# Patient Record
Sex: Female | Born: 1935 | Race: White | Hispanic: No | Smoking: Never smoker
Health system: Southern US, Community
[De-identification: ages and names within clinical notes are randomized; demographics above are authoritative.]

## PROBLEM LIST (undated history)

## (undated) DIAGNOSIS — M5137 Other intervertebral disc degeneration, lumbosacral region: Secondary | ICD-10-CM

## (undated) DIAGNOSIS — Z7901 Long term (current) use of anticoagulants: Secondary | ICD-10-CM

## (undated) DIAGNOSIS — M47817 Spondylosis without myelopathy or radiculopathy, lumbosacral region: Secondary | ICD-10-CM

## (undated) DIAGNOSIS — M7989 Other specified soft tissue disorders: Secondary | ICD-10-CM

## (undated) DIAGNOSIS — I509 Heart failure, unspecified: Secondary | ICD-10-CM

## (undated) DIAGNOSIS — E785 Hyperlipidemia, unspecified: Secondary | ICD-10-CM

## (undated) DIAGNOSIS — M479 Spondylosis, unspecified: Secondary | ICD-10-CM

## (undated) DIAGNOSIS — N390 Urinary tract infection, site not specified: Secondary | ICD-10-CM

## (undated) DIAGNOSIS — I5032 Chronic diastolic (congestive) heart failure: Secondary | ICD-10-CM

## (undated) DIAGNOSIS — M51379 Other intervertebral disc degeneration, lumbosacral region without mention of lumbar back pain or lower extremity pain: Secondary | ICD-10-CM

## (undated) DIAGNOSIS — I4891 Unspecified atrial fibrillation: Secondary | ICD-10-CM

## (undated) DIAGNOSIS — K5790 Diverticulosis of intestine, part unspecified, without perforation or abscess without bleeding: Secondary | ICD-10-CM

## (undated) DIAGNOSIS — S22000A Wedge compression fracture of unspecified thoracic vertebra, initial encounter for closed fracture: Secondary | ICD-10-CM

## (undated) DIAGNOSIS — I83893 Varicose veins of bilateral lower extremities with other complications: Secondary | ICD-10-CM

## (undated) DIAGNOSIS — I10 Essential (primary) hypertension: Secondary | ICD-10-CM

## (undated) DIAGNOSIS — M546 Pain in thoracic spine: Secondary | ICD-10-CM

## (undated) DIAGNOSIS — E559 Vitamin D deficiency, unspecified: Secondary | ICD-10-CM

## (undated) DIAGNOSIS — K635 Polyp of colon: Secondary | ICD-10-CM

## (undated) DIAGNOSIS — M1990 Unspecified osteoarthritis, unspecified site: Secondary | ICD-10-CM

## (undated) DIAGNOSIS — I2510 Atherosclerotic heart disease of native coronary artery without angina pectoris: Secondary | ICD-10-CM

## (undated) DIAGNOSIS — E119 Type 2 diabetes mellitus without complications: Secondary | ICD-10-CM

## (undated) HISTORY — DX: Atherosclerotic heart disease of native coronary artery without angina pectoris: I25.10

## (undated) HISTORY — DX: Other intervertebral disc degeneration, lumbosacral region without mention of lumbar back pain or lower extremity pain: M51.379

## (undated) HISTORY — PX: ADENOIDECTOMY: SUR15

## (undated) HISTORY — DX: Pain in thoracic spine: M54.6

## (undated) HISTORY — DX: Spondylosis without myelopathy or radiculopathy, lumbosacral region: M47.817

## (undated) HISTORY — PX: EYE SURGERY: SHX253

## (undated) HISTORY — DX: Essential (primary) hypertension: I10

## (undated) HISTORY — DX: Spondylosis, unspecified: M47.9

## (undated) HISTORY — DX: Type 2 diabetes mellitus without complications: E11.9

## (undated) HISTORY — DX: Unspecified atrial fibrillation: I48.91

## (undated) HISTORY — DX: Unspecified osteoarthritis, unspecified site: M19.90

## (undated) HISTORY — PX: CORONARY ANGIOPLASTY: SHX604

## (undated) HISTORY — DX: Other intervertebral disc degeneration, lumbosacral region: M51.37

## (undated) HISTORY — DX: Wedge compression fracture of unspecified thoracic vertebra, initial encounter for closed fracture: S22.000A

## (undated) HISTORY — PX: TONSILLECTOMY: SUR1361

## (undated) HISTORY — DX: Urinary tract infection, site not specified: N39.0

## (undated) HISTORY — DX: Polyp of colon: K63.5

## (undated) HISTORY — DX: Hyperlipidemia, unspecified: E78.5

## (undated) HISTORY — DX: Varicose veins of bilateral lower extremities with other complications: I83.893

## (undated) HISTORY — DX: Chronic diastolic (congestive) heart failure: I50.32

## (undated) HISTORY — DX: Diverticulosis of intestine, part unspecified, without perforation or abscess without bleeding: K57.90

## (undated) HISTORY — DX: Long term (current) use of anticoagulants: Z79.01

## (undated) HISTORY — DX: Heart failure, unspecified: I50.9

## (undated) HISTORY — DX: Other specified soft tissue disorders: M79.89

## (undated) HISTORY — DX: Vitamin D deficiency, unspecified: E55.9

---

## 2008-10-01 DIAGNOSIS — I4891 Unspecified atrial fibrillation: Secondary | ICD-10-CM

## 2008-10-01 HISTORY — PX: CARDIAC CATHETERIZATION: SHX172

## 2008-10-01 HISTORY — DX: Unspecified atrial fibrillation: I48.91

## 2010-03-15 ENCOUNTER — Encounter: Admission: RE | Admit: 2010-03-15 | Discharge: 2010-03-15 | Payer: Self-pay | Admitting: Internal Medicine

## 2011-03-27 IMAGING — US US EXTREM LOW VENOUS BILAT
1 series · 13 of 24 positions shown · non-contrast
Comparison: none

CLINICAL DATA: Symptomatic bilateral lower extremity varicose veins

BILATERALLOWER EXTREMITY VENOUS DOPPLER ULTRASOUND
TECHNIQUE: Gray-scale sonography with compression as well as color
and duplex Doppler ultrasound were performed to evaluate the deep
venous system from the level of the common femoral vein through the
popliteal and proximal calf veins. Standing gray-scale sonography
and duplex Doppler evaluation of the superficial venous system with
augmentation was performed.

[Series 1: us extrem low venous bilat · 13 of 56 slices shown]
[im 1/56]
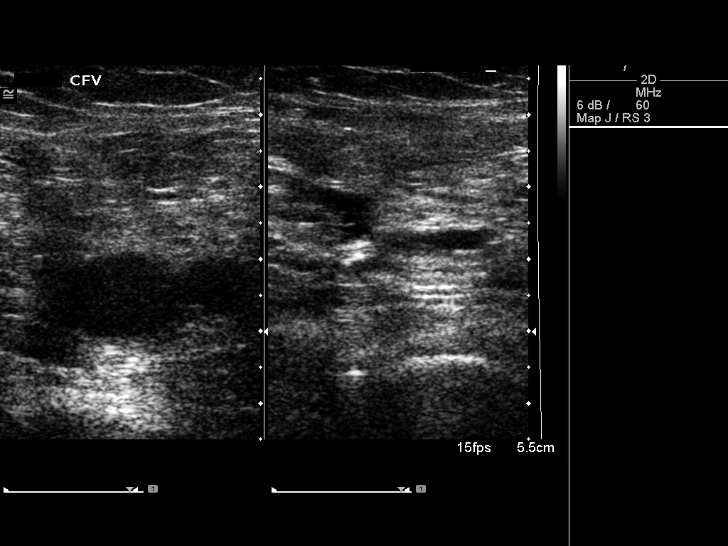
[im 5/56]
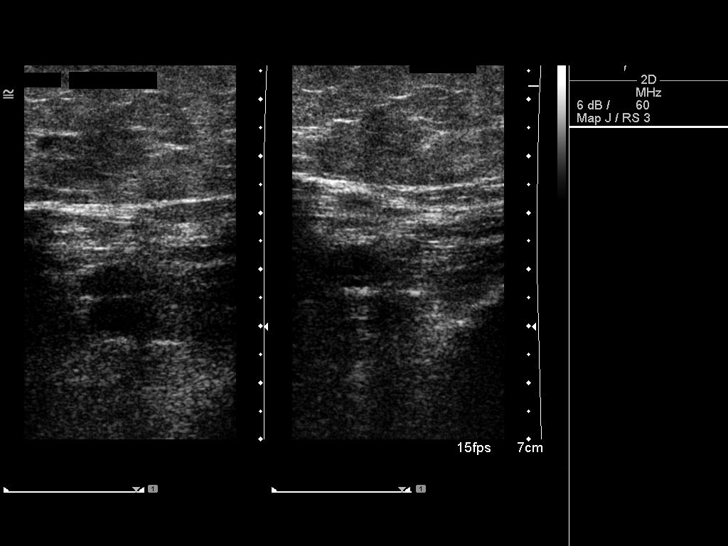
[im 10/56]
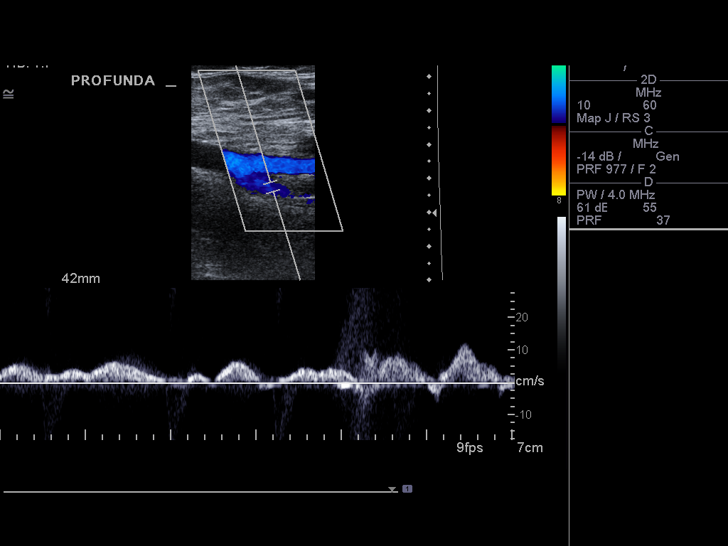
[im 15/56]
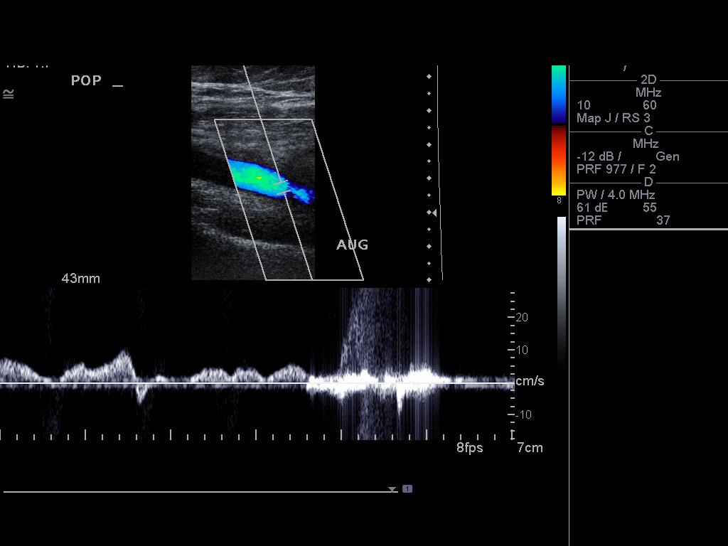
[im 20/56]
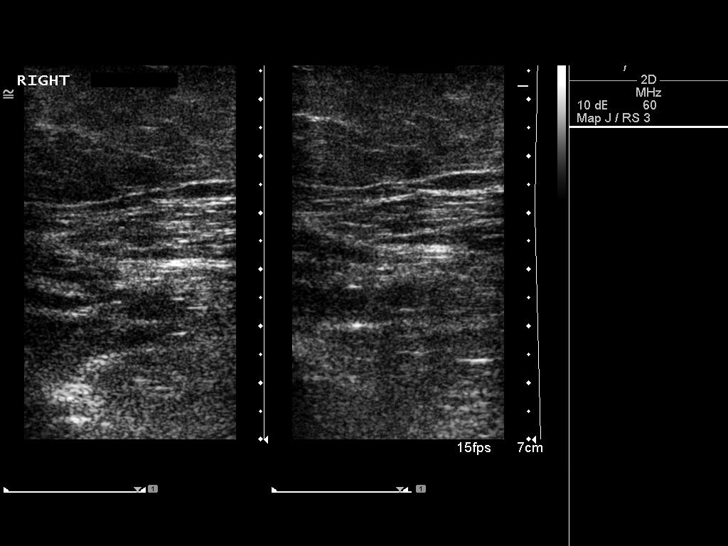
[im 24/56]
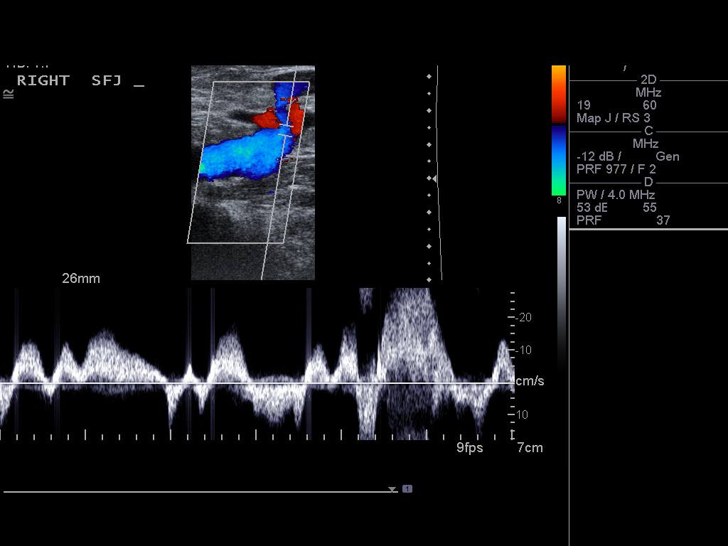
[im 29/56]
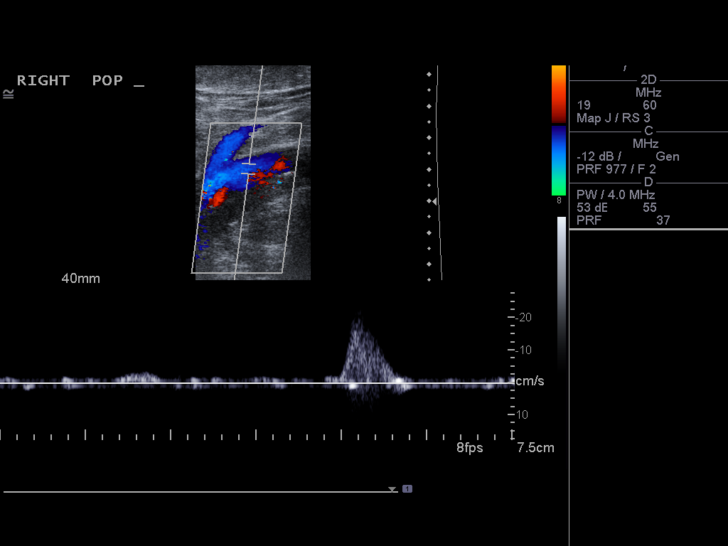
[im 32/56]
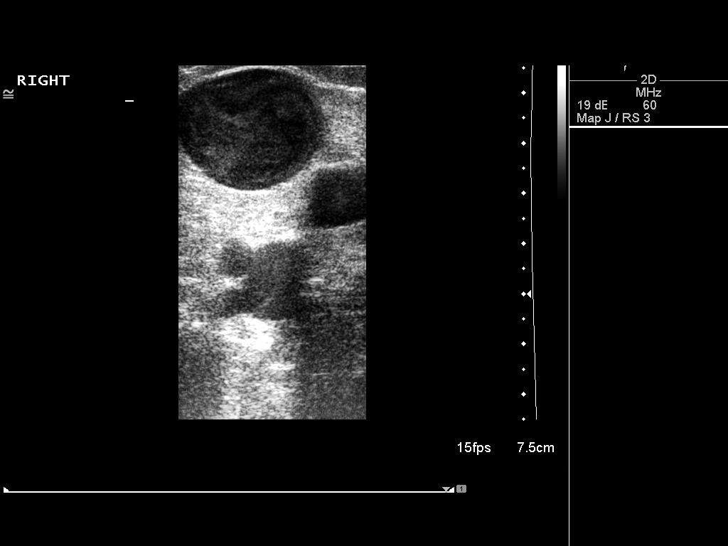
[im 36/56]
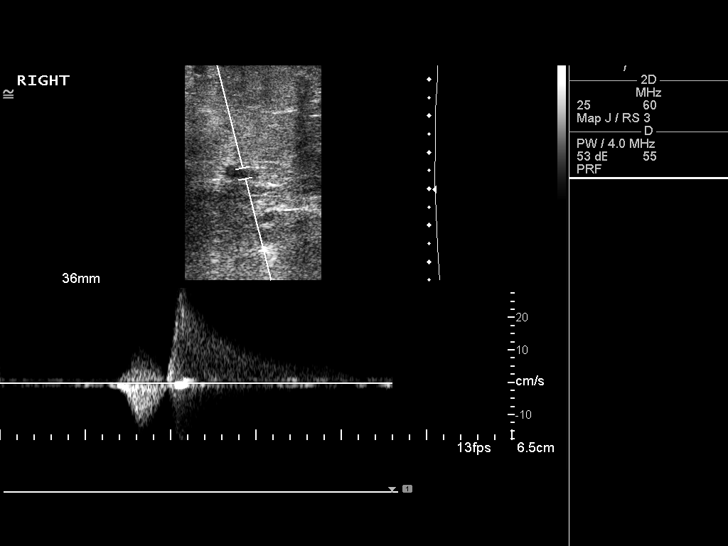
[im 41/56]
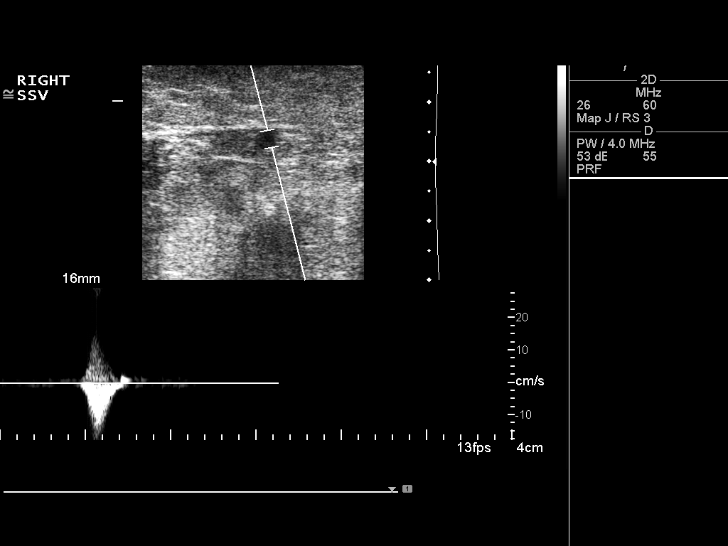
[im 46/56]
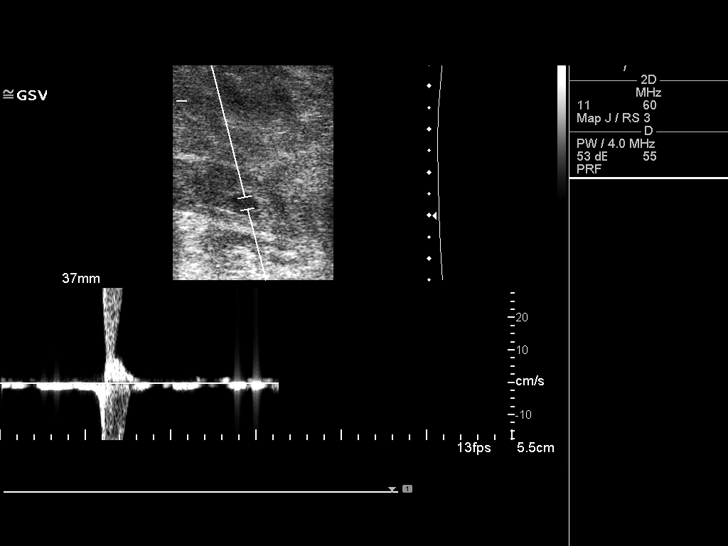
[im 51/56]
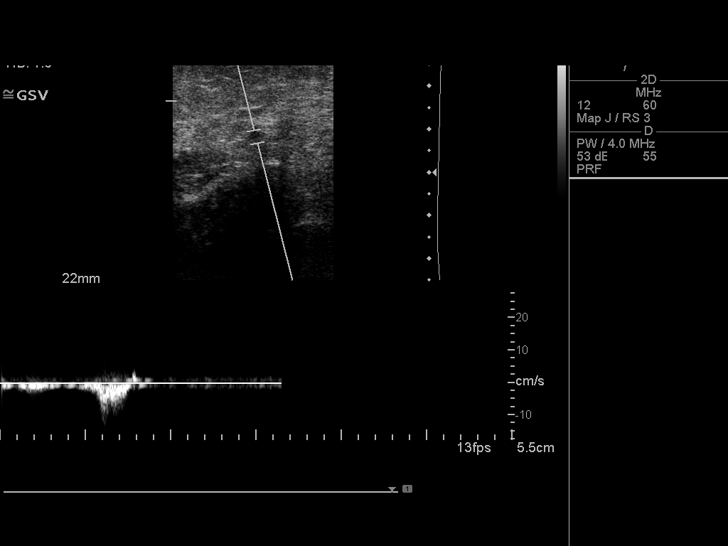
[im 56/56]
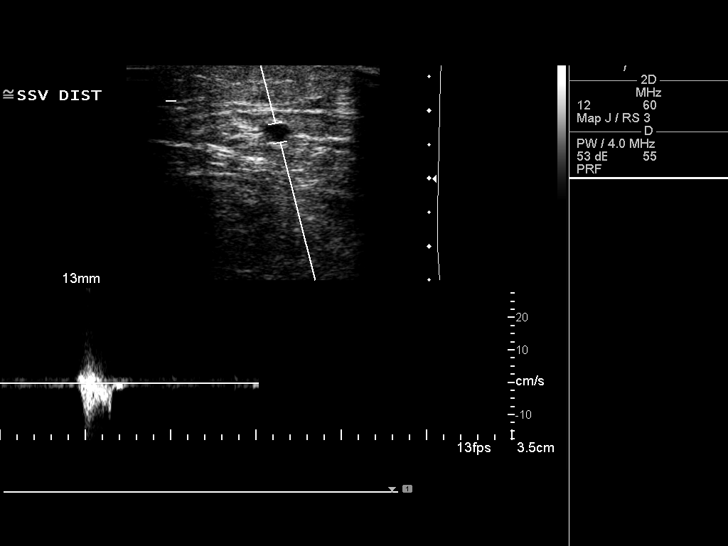

[13 of 24 positions shown; findings below may reference images not displayed]

FINDINGS: The lower extremity deep venous system demonstrates
normal compressibility, phasicity, and augmentation. No evidence of
DVT.

On the right, there is valvular incompetence and reflux in the
greater saphenous vein from the saphenofemoral junction to the
distal thigh.  There is a large dilated anterolateral GSV tributary
which is tortuous and extends across the knee into the proximal
calf.  This vessel is dilated up to 2.8 cm proximally.  The short
saphenous vein is unremarkable.

On the left, the greater short saphenous veins show no valvular
incompetence or reflux.  There are a few scattered non associated
varicose veins in the distal thigh.

IMPRESSION

1.  Extensive valvular incompetence and reflux in the right greater
saphenous vein and a dilated anterolateral tributary.
2.  No valvular incompetence or reflux in the left short and
greater saphenous veins.
3.  Normal deep venous system bilaterally.

## 2011-04-23 ENCOUNTER — Ambulatory Visit: Payer: Self-pay | Admitting: Cardiovascular Disease

## 2011-05-03 ENCOUNTER — Ambulatory Visit: Payer: Self-pay | Admitting: Cardiovascular Disease

## 2011-05-03 ENCOUNTER — Encounter: Payer: Self-pay | Admitting: Cardiovascular Disease

## 2011-05-07 ENCOUNTER — Ambulatory Visit (INDEPENDENT_AMBULATORY_CARE_PROVIDER_SITE_OTHER): Payer: Medicare Other | Admitting: Cardiovascular Disease

## 2011-05-07 ENCOUNTER — Encounter: Payer: Self-pay | Admitting: Cardiovascular Disease

## 2011-05-07 VITALS — BP 192/94 | HR 50 | Ht 64.0 in | Wt 188.0 lb

## 2011-05-07 DIAGNOSIS — I2510 Atherosclerotic heart disease of native coronary artery without angina pectoris: Secondary | ICD-10-CM

## 2011-05-07 DIAGNOSIS — I10 Essential (primary) hypertension: Secondary | ICD-10-CM

## 2011-05-07 DIAGNOSIS — I482 Chronic atrial fibrillation, unspecified: Secondary | ICD-10-CM | POA: Insufficient documentation

## 2011-05-07 DIAGNOSIS — I4891 Unspecified atrial fibrillation: Secondary | ICD-10-CM

## 2011-05-07 MED ORDER — AMLODIPINE BESYLATE 5 MG PO TABS: 5.0000 mg | ORAL_TABLET | Freq: Every day | ORAL | Status: DC

## 2011-05-07 NOTE — Progress Notes (Signed)
HPI  This is a 76 year old female who is here today to establish cardiovascular care. She is transferring care from [place] [facility]. She has history of atrial fibrillation which was diagnosed in 2010. She has been in atrial fibrillation since then. She had palpitations for years before that. She had cardiac catheterization done at that time at [facility] which showed mild nonobstructive coronary artery disease with normal ejection fraction. She has been treated with rate control which include digoxin, diltiazem and metoprolol. She is also on long-term anticoagulation with warfarin which is being managed by Dr. [name].  She also has history of refractory hypertension. She had a renal duplex ultrasound done in 2010 which showed no significant renal artery stenosis. She was placed recently on Exforge- HCTZ which controlled her hypertension. However, she had significant drop in her blood pressure and stopped taking the medication. She was still taking all her other antihypertensive medications though. She does not take her blood pressure medications on the same time everyday and sometimes she misses a few doses. Currently, she complains of slight dizziness and fatigue. She also had few episodes of chest pain recently after she had coffee. Usually she is not able to drink coffee due to what seems to be gastroesophageal reflux disease. She did have a few episodes of chest pain with some physical activities also.  Allergies  Allergen Reactions  . Iodinated Diagnostic Agents   . Penicillins      Current Outpatient Prescriptions on File Prior to Visit  Medication Sig Dispense Refill  . aspirin 81 MG EC tablet Take 81 mg by mouth daily.        . benazepril (LOTENSIN) 20 MG tablet Take 20 mg by mouth 2 (two) times daily.        . digoxin (LANOXIN) 0.25 MG tablet Take 250 mcg by mouth daily.        . fish oil-omega-3 fatty acids 1000 MG capsule Take 2 g by mouth 2 (two) times daily.         . furosemide (LASIX) 40 MG tablet Take 40 mg by mouth 2 (two) times daily.        Marland Kitchen glipiZIDE (GLUCOTROL XL) 5 MG 24 hr tablet Take 5 mg by mouth daily.        . hydrALAZINE (APRESOLINE) 25 MG tablet Take 25 mg by mouth 3 (three) times daily.        . isosorbide mononitrate (IMDUR) 60 MG 24 hr tablet Take 60 mg by mouth daily.        . metFORMIN (GLUCOPHAGE) 500 MG tablet Take 500 mg by mouth 2 (two) times daily with a meal.        . metoprolol (TOPROL-XL) 50 MG 24 hr tablet Take 50 mg by mouth daily.        . potassium chloride (KLOR-CON) 10 MEQ CR tablet Take 10 mEq by mouth daily.        . potassium chloride SA (K-DUR,KLOR-CON) 20 MEQ tablet Take 20 mEq by mouth daily.        . pravastatin (PRAVACHOL) 40 MG tablet Take 80 mg by mouth at bedtime.       Marland Kitchen warfarin (COUMADIN) 5 MG tablet Take 5 mg by mouth as directed.           Past Medical History  Diagnosis Date  . UTI (lower urinary tract infection)   . Swelling of limb   . Varicose veins of lower extremities with other complications   .  Congestive heart failure, unspecified   . Osteoarthrosis, unspecified whether generalized or localized, unspecified site   . Lumbosacral spondylosis without myelopathy   . Hemorrhoids   . DM2 (diabetes mellitus, type 2)   . Colonic polyp   . Degeneration of lumbar or lumbosacral intervertebral disc   . Diverticulosis   . HLD (hyperlipidemia)   . Pain in thoracic spine   . Atrial fibrillation 2010  . HTN (hypertension)     diagnosed when she was in her 52s. Refractory. No RAS by [place] in 2010  . Coronary artery disease     mild non obstructive disease per cardiac cath in 2010     Past Surgical History  Procedure Date  . Tonsillectomy   . Adenoidectomy   . Cardiac catheterization 2010     Family History  Problem Relation Age of Onset  . Heart disease    . Cancer Sister   . Stroke Mother      History   Social History  . Marital Status: Unknown    Spouse Name: N/A    Number of  Children: N/A  . Years of Education: N/A   Occupational History  . retired    Social History Main Topics  . Smoking status: Never Smoker   . Smokeless tobacco: Not on file  . Alcohol Use: No  . Drug Use: Not on file  . Sexually Active: Not on file   Other Topics Concern  . Not on file   Social History Narrative  . No narrative on file     ROS Constitutional: Negative for fever, chills, diaphoresis, activity change, appetite change and fatigue.  HENT: Negative for hearing loss, nosebleeds, congestion, sore throat, facial swelling, drooling, trouble swallowing, neck pain, voice change, sinus pressure and tinnitus.  Eyes: Negative for photophobia, pain, discharge and visual disturbance.  Respiratory: Negative for apnea, cough, chest tightness, shortness of breath and wheezing.  Cardiovascular: Negative for , palpitations. Gastrointestinal: Negative for nausea, vomiting, abdominal pain, diarrhea, constipation, blood in stool and abdominal distention.  Genitourinary: Negative for dysuria, urgency, frequency, hematuria and decreased urine volume.  Musculoskeletal: Negative for myalgias, back pain, joint swelling, arthralgias and gait problem.  Skin: Negative for color change, pallor, rash and wound.  Neurological: Negative for dizziness, tremors, seizures, syncope, speech difficulty, weakness, light-headedness, numbness and headaches.  Psychiatric/Behavioral: Negative for suicidal ideas, hallucinations, behavioral problems and agitation. The patient is not nervous/anxious.     PHYSICAL EXAM   BP 192/94  Pulse 50  Ht 5' 4" (1.626 m)  Wt 188 lb (85.276 kg)  BMI 32.27 kg/m2  SpO2 97%   EKG: Atrial fibrillation with slow ventricular response. Ventricular rate is 50 beats per minute. There is nonspecific ST-T wave changes.   ASSESSMENT AND PLAN

## 2011-05-07 NOTE — Assessment & Plan Note (Signed)
Patient has refractory hypertension with negative workup for renal artery stenosis in the past. She did not take her medications today and missed taking benazepril for the last 2 days. I discussed with her the importance of compliance in taking her medications on time and preferably in the morning. I will start her on amlodipine 5 mg once daily. I think some of her  Problems might be related to compliance and inability to keep up with all her medications. I will try to gradually simplified her antihypertensive regimen. it seems that the Exforge actually worked well for her but she was still taking the other antihypertensive medications. I would consider putting her back on such a combination medication and withdrawing hydralazine, Imdur were and possibly benazepril.

## 2011-05-07 NOTE — Assessment & Plan Note (Signed)
This is chronic at this point. Ejection fraction is normal. This is being treated with rate control and long-term anticoagulation with warfarin. Her ventricular rate is too slow. She actually did have a Holter monitor in the past which was reviewed. It showed significant slow ventricular rate occasionally in the 30s with pauses up to 3.5 seconds. I reviewed all her cardiac records. Based on that, I recommend stopping diltiazem and keeping her on digoxin and metoprolol. If further heart rate control is needed, then I would increase her dose of metoprolol which is currently only 50 mg once daily.

## 2011-05-07 NOTE — Assessment & Plan Note (Signed)
Patient is having some atypical chest pain at this point. Her previous cardiac catheterization showed no evidence of obstructive coronary artery disease. I will consider ischemic evaluation with a stress test upon followup if she continues to have chest pain after controlling her blood pressure.

## 2011-05-07 NOTE — Patient Instructions (Signed)
Your physician recommends that you schedule a follow-up appointment in: 2 weeks  Your physician has recommended you make the following change in your medication: STOP diltiazem and START Amlodipine 5 mg daily

## 2011-05-14 ENCOUNTER — Encounter: Payer: Self-pay | Admitting: Cardiovascular Disease

## 2011-05-21 ENCOUNTER — Ambulatory Visit (INDEPENDENT_AMBULATORY_CARE_PROVIDER_SITE_OTHER): Payer: Medicare Other | Admitting: Cardiovascular Disease

## 2011-05-21 ENCOUNTER — Encounter: Payer: Self-pay | Admitting: Cardiovascular Disease

## 2011-05-21 DIAGNOSIS — I4891 Unspecified atrial fibrillation: Secondary | ICD-10-CM

## 2011-05-21 DIAGNOSIS — I10 Essential (primary) hypertension: Secondary | ICD-10-CM

## 2011-05-21 MED ORDER — SPIRONOLACTONE 25 MG PO TABS: 25.0000 mg | ORAL_TABLET | Freq: Every day | ORAL | Status: DC

## 2011-05-21 MED ORDER — CARVEDILOL 25 MG PO TABS: 25.0000 mg | ORAL_TABLET | Freq: Two times a day (BID) | ORAL | Status: DC

## 2011-05-21 NOTE — Assessment & Plan Note (Signed)
The patient clearly has refractory hypertension. No previous renal artery stenosis by ultrasound in 2010. I will switch her from metoprolol to carvedilol 25 mg twice daily which has a stronger antihypertensive effect. I will also add Aldactone 25 mg once daily which is helpful in cases of refractory hypertension. I will check basic metabolic profile in one week.

## 2011-05-21 NOTE — Patient Instructions (Signed)
Your physician recommends that you schedule a follow-up appointment in: 2 weeks  Your physician has recommended you make the following change in your medication: STOP Toprol (metoprolol) START Carvedilol 25 mg twice daily and Spironolactone 25 mg daily  Your physician recommends that you return for lab work in: 1 week (BMP & Digoxin level)

## 2011-05-21 NOTE — Progress Notes (Signed)
HPI  This is a 76 year old female who is here today for a followup visit. She was seen recently for atrial fibrillation as well as refractory hypertension. Her ventricular rate was slow and thus, I  Stopped diltiazem and switched her to amlodipine 5 mg once daily. The patient is feeling reasonably well. She denies any palpitations. Her blood pressure is still elevated. She denies any significant dyspnea or chest pain at this time.   Allergies  Allergen Reactions  . Iodinated Diagnostic Agents   . Penicillins      Current Outpatient Prescriptions on File Prior to Visit  Medication Sig Dispense Refill  . amLODipine (NORVASC) 5 MG tablet Take 1 tablet (5 mg total) by mouth daily.  30 tablet  2  . aspirin 81 MG EC tablet Take 81 mg by mouth daily.        . benazepril (LOTENSIN) 20 MG tablet Take 20 mg by mouth 2 (two) times daily.        . digoxin (LANOXIN) 0.25 MG tablet Take 250 mcg by mouth daily.        . fish oil-omega-3 fatty acids 1000 MG capsule Take 2 g by mouth 2 (two) times daily.       . furosemide (LASIX) 40 MG tablet Take 40 mg by mouth 2 (two) times daily.        Marland Kitchen glipiZIDE (GLUCOTROL XL) 5 MG 24 hr tablet Take 5 mg by mouth daily.        . hydrALAZINE (APRESOLINE) 25 MG tablet Take 25 mg by mouth 2 (two) times daily.       . isosorbide mononitrate (IMDUR) 60 MG 24 hr tablet Take 60 mg by mouth daily.        . metFORMIN (GLUCOPHAGE) 500 MG tablet Take 500 mg by mouth 2 (two) times daily with a meal.        . potassium chloride (KLOR-CON) 10 MEQ CR tablet Take 10 mEq by mouth daily.        . potassium chloride SA (K-DUR,KLOR-CON) 20 MEQ tablet Take 20 mEq by mouth daily.       . pravastatin (PRAVACHOL) 40 MG tablet Take 40 mg by mouth at bedtime.       Marland Kitchen warfarin (COUMADIN) 5 MG tablet Take 5 mg by mouth as directed.           Past Medical History  Diagnosis Date  . UTI (lower urinary tract infection)   . Swelling of limb   . Varicose veins of lower extremities with other  complications   . Congestive heart failure, unspecified   . Osteoarthrosis, unspecified whether generalized or localized, unspecified site   . Lumbosacral spondylosis without myelopathy   . Hemorrhoids   . DM2 (diabetes mellitus, type 2)   . Colonic polyp   . Degeneration of lumbar or lumbosacral intervertebral disc   . Diverticulosis   . HLD (hyperlipidemia)   . Pain in thoracic spine   . Atrial fibrillation 2010  . Coronary artery disease     mild non obstructive disease per cardiac cath in 2010  . HTN (hypertension)     diagnosed when she was in her 41s. Refractory. No RAS by [place] in 2010     Past Surgical History  Procedure Date  . Tonsillectomy   . Adenoidectomy   . Cardiac catheterization 2010     Family History  Problem Relation Age of Onset  . Heart disease    . Cancer Sister   .  Stroke Mother      History   Social History  . Marital Status: Unknown    Spouse Name: N/A    Number of Children: N/A  . Years of Education: N/A   Occupational History  . retired    Social History Main Topics  . Smoking status: Never Smoker   . Smokeless tobacco: Not on file  . Alcohol Use: No  . Drug Use: Not on file  . Sexually Active: Not on file   Other Topics Concern  . Not on file   Social History Narrative  . No narrative on file       PHYSICAL EXAM   BP 197/89  Pulse 77  Ht _0  (1.626 m)  Wt 187 lb (84.823 kg)  BMI 32.10 kg/m2  SpO2 96%  Constitutional: She is oriented to person, place, and time. She appears well-developed and well-nourished. No distress.  HENT: No nasal discharge.  Head: Normocephalic and atraumatic.  Eyes: Pupils are equal, round, and reactive to light. Right eye exhibits no discharge. Left eye exhibits no discharge.  Neck: Normal range of motion. Neck supple. No JVD present. No thyromegaly present.  Cardiovascular: Normal rate, irregular rhythm, normal heart sounds and intact distal pulses. Exam reveals no gallop and no friction  rub.  No murmur heard.  Pulmonary/Chest: Effort normal and breath sounds normal. No stridor. No respiratory distress. She has no wheezes. She has no rales. She exhibits no tenderness.  Abdominal: Soft. Bowel sounds are normal. She exhibits no distension. There is no tenderness. There is no rebound and no guarding.  Musculoskeletal: Normal range of motion. She exhibits trace edema and no tenderness.  Neurological: She is alert and oriented to person, place, and time. Coordination normal.  Skin: Skin is warm and dry. No rash noted. She is not diaphoretic. No erythema. No pallor.  Psychiatric: She has a normal mood and affect. Her behavior is normal. Judgment and thought content normal.      ASSESSMENT AND PLAN

## 2011-05-21 NOTE — Assessment & Plan Note (Signed)
This is chronic. Her ventricular rate is controlled. It's not as low as it was during last visit. She is now off diltiazem. I will switch her today from metoprolol to carvedilol for the purpose  of achieving a better blood pressure control mainly. Continue long-term anticoagulation with warfarin.

## 2011-06-11 ENCOUNTER — Ambulatory Visit: Payer: Medicare Other | Admitting: Cardiovascular Disease

## 2011-06-14 ENCOUNTER — Encounter: Payer: Self-pay | Admitting: Cardiovascular Disease

## 2011-06-14 ENCOUNTER — Ambulatory Visit (INDEPENDENT_AMBULATORY_CARE_PROVIDER_SITE_OTHER): Payer: Medicare Other | Admitting: Cardiovascular Disease

## 2011-06-14 VITALS — BP 145/71 | HR 53 | Ht 64.0 in | Wt 181.0 lb

## 2011-06-14 DIAGNOSIS — I10 Essential (primary) hypertension: Secondary | ICD-10-CM

## 2011-06-14 DIAGNOSIS — I4891 Unspecified atrial fibrillation: Secondary | ICD-10-CM

## 2011-06-14 NOTE — Assessment & Plan Note (Signed)
Her blood pressure improved significantly after the recent changes in her medications. I am concerned about to her volume status as he might be slightly volume dictated. She is also now on spironolactone and is on potassium supplements as she is also on furosemide. Due to that, I will check basic metabolic profile today. I will consider decreasing the dose of Lasix to once daily.

## 2011-06-14 NOTE — Progress Notes (Signed)
HPI  This is a 76 year old female who is here today for followup visit. She has history of chronic atrial fibrillation currently being treated with rate control as well as long-term anticoagulation. She also has history of refractory hypertension. During her last visit, her systolic blood pressure was 198 mmHg. Thus, I decided to switch her from metoprolol to carvedilol and also add spironolactone. She has been taking these medications without any reported side effects. She feels slightly better than before. There has been no dizziness, syncope or presyncope. She continues to be bradycardic. Her lower extremity edema has improved. She lost about 5 pounds since her last visit.  Allergies  Allergen Reactions  . Iodinated Diagnostic Agents   . Penicillins      Current Outpatient Prescriptions on File Prior to Visit  Medication Sig Dispense Refill  . amLODipine (NORVASC) 5 MG tablet Take 1 tablet (5 mg total) by mouth daily.  30 tablet  2  . aspirin 81 MG EC tablet Take 81 mg by mouth daily.        . benazepril (LOTENSIN) 20 MG tablet Take 20 mg by mouth 2 (two) times daily.        . carvedilol (COREG) 25 MG tablet Take 1 tablet (25 mg total) by mouth 2 (two) times daily.  60 tablet  6  . fish oil-omega-3 fatty acids 1000 MG capsule Take 2 g by mouth 2 (two) times daily.       . furosemide (LASIX) 40 MG tablet Take 40 mg by mouth 2 (two) times daily.        Marland Kitchen glipiZIDE (GLUCOTROL XL) 5 MG 24 hr tablet Take 5 mg by mouth daily.        . hydrALAZINE (APRESOLINE) 25 MG tablet Take 25 mg by mouth 2 (two) times daily.       . isosorbide mononitrate (IMDUR) 60 MG 24 hr tablet Take 60 mg by mouth daily.        . metFORMIN (GLUCOPHAGE) 500 MG tablet Take 500 mg by mouth 2 (two) times daily with a meal.        . potassium chloride (KLOR-CON) 10 MEQ CR tablet Take 10 mEq by mouth daily.        . potassium chloride SA (K-DUR,KLOR-CON) 20 MEQ tablet Take 20 mEq by mouth daily.       . pravastatin (PRAVACHOL)  40 MG tablet Take 40 mg by mouth at bedtime.       Marland Kitchen spironolactone (ALDACTONE) 25 MG tablet Take 1 tablet (25 mg total) by mouth daily.  30 tablet  6  . warfarin (COUMADIN) 5 MG tablet Take 5 mg by mouth as directed.           Past Medical History  Diagnosis Date  . UTI (lower urinary tract infection)   . Swelling of limb   . Varicose veins of lower extremities with other complications   . Congestive heart failure, unspecified   . Osteoarthrosis, unspecified whether generalized or localized, unspecified site   . Lumbosacral spondylosis without myelopathy   . Hemorrhoids   . DM2 (diabetes mellitus, type 2)   . Colonic polyp   . Degeneration of lumbar or lumbosacral intervertebral disc   . Diverticulosis   . HLD (hyperlipidemia)   . Pain in thoracic spine   . Atrial fibrillation 2010  . HTN (hypertension)     diagnosed when she was in her 31s. Refractory. No RAS by [place] in 2010  . Coronary artery disease  mild non obstructive disease per cardiac cath in 2010     Past Surgical History  Procedure Date  . Tonsillectomy   . Adenoidectomy   . Cardiac catheterization 2010     Family History  Problem Relation Age of Onset  . Heart disease    . Cancer Sister   . Stroke Mother      History   Social History  . Marital Status: Unknown    Spouse Name: N/A    Number of Children: N/A  . Years of Education: N/A   Occupational History  . retired    Social History Main Topics  . Smoking status: Never Smoker   . Smokeless tobacco: Not on file  . Alcohol Use: No  . Drug Use: Not on file  . Sexually Active: Not on file   Other Topics Concern  . Not on file   Social History Narrative  . No narrative on file      PHYSICAL EXAM   BP 145/71  Pulse 53  Ht _0  (1.626 m)  Wt 181 lb (82.101 kg)  BMI 31.07 kg/m2  SpO2 97%  Constitutional: She is oriented to person, place, and time. She appears well-developed and well-nourished. No distress.  HENT: No nasal  discharge.  Head: Normocephalic and atraumatic.  Eyes: Pupils are equal, round, and reactive to light. Right eye exhibits no discharge. Left eye exhibits no discharge.  Neck: Normal range of motion. Neck supple. No JVD present. No thyromegaly present.  Cardiovascular: Normal rate, irregular rhythm, normal heart sounds. Exam reveals no gallop and no friction rub.  No murmur heard.  Pulmonary/Chest: Effort normal and breath sounds normal. No stridor. No respiratory distress. She has no wheezes. She has no rales. She exhibits no tenderness.  Abdominal: Soft. Bowel sounds are normal. She exhibits no distension. There is no tenderness. There is no rebound and no guarding.  Musculoskeletal: Normal range of motion. She exhibits trace edema and no tenderness.  Neurological: She is alert and oriented to person, place, and time. Coordination normal.  Skin: Skin is warm and dry. No rash noted. She is not diaphoretic. No erythema. No pallor.  Psychiatric: She has a normal mood and affect. Her behavior is normal. Judgment and thought content normal.      ASSESSMENT AND PLAN

## 2011-06-14 NOTE — Assessment & Plan Note (Signed)
This is chronic and currently being treated with rate control. Her ventricular rate is still slow. Thus, I will stop the digoxin and continue with carvedilol 25 mg twice daily which seems to be helping her blood pressure. Continue long-term anticoagulation with warfarin which is being managed by her primary care physician, Dr. [name].

## 2011-06-14 NOTE — Patient Instructions (Signed)
Your physician recommends that you schedule a follow-up appointment in: 6 months in [place] office  Your physician recommends that you return for lab work in: today [facility])  Your physician has recommended you make the following change in your medication: STOP Digoxin

## 2011-07-06 ENCOUNTER — Telehealth: Payer: Self-pay | Admitting: Cardiovascular Disease

## 2011-07-06 NOTE — Telephone Encounter (Signed)
Pt rtn call to someone not sure who called or what they where calling about

## 2011-07-06 NOTE — Telephone Encounter (Signed)
Spoke with Pt about labwork

## 2011-07-10 ENCOUNTER — Other Ambulatory Visit: Payer: Self-pay | Admitting: Cardiovascular Disease

## 2011-07-18 ENCOUNTER — Encounter: Payer: Self-pay | Admitting: Cardiovascular Disease

## 2011-07-20 ENCOUNTER — Telehealth: Payer: Self-pay | Admitting: Cardiovascular Disease

## 2011-07-20 NOTE — Telephone Encounter (Signed)
Patient aware of labs results done 07/19/11. She knows  that  Dr. Fletcher Anon needs to review results and make recommendations if needed.

## 2011-07-20 NOTE — Telephone Encounter (Signed)
Pt calling wanting to know the results of pt blood work. Please return pt call to discuss further.

## 2011-07-25 NOTE — Telephone Encounter (Signed)
Pt understands - we haven't ordered any further labs

## 2011-07-25 NOTE — Telephone Encounter (Signed)
Spoke with patient and Sherri regarding labs.  Sherri will call patient later

## 2011-07-25 NOTE — Telephone Encounter (Signed)
Pt called and needs to know if she is due to have lab work done and when. She had some abnormal labs the last time but is unsure when and where to get it checked.   To have at her PCP needs to have an order sent to Dr. [name].  Please call [phone] and let patient know.

## 2011-10-18 DIAGNOSIS — Z7901 Long term (current) use of anticoagulants: Secondary | ICD-10-CM | POA: Diagnosis not present

## 2011-11-08 DIAGNOSIS — I4891 Unspecified atrial fibrillation: Secondary | ICD-10-CM | POA: Diagnosis not present

## 2011-11-08 DIAGNOSIS — Z6837 Body mass index (BMI) 37.0-37.9, adult: Secondary | ICD-10-CM | POA: Diagnosis not present

## 2011-11-08 DIAGNOSIS — Z79899 Other long term (current) drug therapy: Secondary | ICD-10-CM | POA: Diagnosis not present

## 2011-11-08 DIAGNOSIS — R791 Abnormal coagulation profile: Secondary | ICD-10-CM | POA: Diagnosis not present

## 2011-11-08 DIAGNOSIS — E785 Hyperlipidemia, unspecified: Secondary | ICD-10-CM | POA: Diagnosis not present

## 2011-11-08 DIAGNOSIS — I10 Essential (primary) hypertension: Secondary | ICD-10-CM | POA: Diagnosis not present

## 2011-11-14 DIAGNOSIS — Z860100 Personal history of colon polyps, unspecified: Secondary | ICD-10-CM | POA: Diagnosis not present

## 2011-11-14 DIAGNOSIS — K921 Melena: Secondary | ICD-10-CM | POA: Diagnosis not present

## 2011-11-23 ENCOUNTER — Ambulatory Visit: Payer: Medicare Other | Admitting: Cardiovascular Disease

## 2011-11-27 ENCOUNTER — Ambulatory Visit: Payer: Medicare Other | Admitting: Cardiovascular Disease

## 2011-11-29 ENCOUNTER — Ambulatory Visit (INDEPENDENT_AMBULATORY_CARE_PROVIDER_SITE_OTHER): Payer: Medicare Other | Admitting: Cardiovascular Disease

## 2011-11-29 ENCOUNTER — Encounter: Payer: Self-pay | Admitting: Cardiovascular Disease

## 2011-11-29 DIAGNOSIS — I4891 Unspecified atrial fibrillation: Secondary | ICD-10-CM

## 2011-11-29 DIAGNOSIS — I5032 Chronic diastolic (congestive) heart failure: Secondary | ICD-10-CM | POA: Diagnosis not present

## 2011-11-29 DIAGNOSIS — I10 Essential (primary) hypertension: Secondary | ICD-10-CM

## 2011-11-29 DIAGNOSIS — I509 Heart failure, unspecified: Secondary | ICD-10-CM

## 2011-11-29 DIAGNOSIS — I2510 Atherosclerotic heart disease of native coronary artery without angina pectoris: Secondary | ICD-10-CM

## 2011-11-29 NOTE — Assessment & Plan Note (Signed)
This was mild on her cardiac catheterization in 2010. No indication for  ischemic evaluation before her colonoscopy or EGD. Her overall cardiac risk is low.

## 2011-11-29 NOTE — Progress Notes (Signed)
HPI  This is a 76 year old female who is here today for a followup visit. I saw her last year for chronic atrial fibrillation, refractory hypertension and diastolic heart failure. She had a cardiac catheterization done in 2010 which showed mild nonobstructive coronary artery disease. At that time, I switched her metoprolol to carvedilol. I also started her on spironolactone. She did have initial worsening of renal function. The dose of furosemide was gradually decreased and currently she is on 20 mg once daily. Her renal function stabilized and went back to normal. Her most recent labs showed a creatinine of 0.86, BUN of 21, sodium of 140 and potassium of 4.4. Her blood pressure continues to be controlled. Her lower extremity edema has actually improved. Her dyspnea is better. She did have some shoulder discomfort that happened at rest. She had a recent ECG that was overall unremarkable. She is having problems with dysphagia and is scheduled to have an EGD and colonoscopy done next week.  Allergies  Allergen Reactions  . Iodinated Diagnostic Agents   . Penicillins      Current Outpatient Prescriptions on File Prior to Visit  Medication Sig Dispense Refill  . amLODipine (NORVASC) 5 MG tablet TAKE ONE TABLET BY MOUTH EVERY DAY  30 tablet  3  . aspirin 81 MG EC tablet Take 81 mg by mouth daily.        . benazepril (LOTENSIN) 20 MG tablet Take 20 mg by mouth 2 (two) times daily.        . carvedilol (COREG) 25 MG tablet Take 1 tablet (25 mg total) by mouth 2 (two) times daily.  60 tablet  6  . fish oil-omega-3 fatty acids 1000 MG capsule Take 2 g by mouth 2 (two) times daily.       . furosemide (LASIX) 40 MG tablet Take 40 mg by mouth 2 (two) times daily.        Marland Kitchen glipiZIDE (GLUCOTROL XL) 5 MG 24 hr tablet Take 5 mg by mouth daily.        . hydrALAZINE (APRESOLINE) 25 MG tablet Take 25 mg by mouth 2 (two) times daily.       . isosorbide mononitrate (IMDUR) 60 MG 24 hr tablet Take 60 mg by mouth daily.         . metFORMIN (GLUCOPHAGE) 500 MG tablet Take 500 mg by mouth 2 (two) times daily with a meal.        . potassium chloride (KLOR-CON) 10 MEQ CR tablet Take 10 mEq by mouth daily.        . potassium chloride SA (K-DUR,KLOR-CON) 20 MEQ tablet Take 20 mEq by mouth daily.       . pravastatin (PRAVACHOL) 40 MG tablet Take 40 mg by mouth at bedtime.       Marland Kitchen spironolactone (ALDACTONE) 25 MG tablet Take 1 tablet (25 mg total) by mouth daily.  30 tablet  6  . warfarin (COUMADIN) 5 MG tablet Take 5 mg by mouth as directed.           Past Medical History  Diagnosis Date  . UTI (lower urinary tract infection)   . Swelling of limb   . Varicose veins of lower extremities with other complications   . Congestive heart failure, unspecified   . Osteoarthrosis, unspecified whether generalized or localized, unspecified site   . Lumbosacral spondylosis without myelopathy   . Hemorrhoids   . DM2 (diabetes mellitus, type 2)   . Colonic polyp   . Degeneration  of lumbar or lumbosacral intervertebral disc   . Diverticulosis   . HLD (hyperlipidemia)   . Pain in thoracic spine   . Atrial fibrillation 2010  . HTN (hypertension)     diagnosed when she was in her 39s. Refractory. No RAS by [place] in 2010  . Coronary artery disease     mild non obstructive disease per cardiac cath in 2010     Past Surgical History  Procedure Date  . Tonsillectomy   . Adenoidectomy   . Cardiac catheterization 2010    At [place]. Mild nonobstructive CAD     Family History  Problem Relation Age of Onset  . Heart disease    . Cancer Sister   . Stroke Mother      History   Social History  . Marital Status: Unknown    Spouse Name: N/A    Number of Children: N/A  . Years of Education: N/A   Occupational History  . retired    Social History Main Topics  . Smoking status: Never Smoker   . Smokeless tobacco: Not on file  . Alcohol Use: No  . Drug Use: Not on file  . Sexually Active: Not on file    Other Topics Concern  . Not on file   Social History Narrative  . No narrative on file     PHYSICAL EXAM   BP 136/80  Pulse 76  Resp 18  Ht _0  (1.626 m)  Wt 208 lb 6.4 oz (94.53 kg)  BMI 35.77 kg/m2  SpO2 96%  Constitutional: She is oriented to person, place, and time. She appears well-developed and well-nourished. No distress.  HENT: No nasal discharge.  Head: Normocephalic and atraumatic.  Eyes: Pupils are equal and round. Right eye exhibits no discharge. Left eye exhibits no discharge.  Neck: Normal range of motion. Neck supple. No JVD present. No thyromegaly present.  Cardiovascular: Normal rate, irregular rhythm, normal heart sounds. Exam reveals no gallop and no friction rub. No murmur heard.  Pulmonary/Chest: Effort normal and breath sounds normal. No stridor. No respiratory distress. She has no wheezes. She has no rales. She exhibits no tenderness.  Abdominal: Soft. Bowel sounds are normal. She exhibits no distension. There is no tenderness. There is no rebound and no guarding.  Musculoskeletal: Normal range of motion. She exhibits trace edema and no tenderness.  Neurological: She is alert and oriented to person, place, and time. Coordination normal.  Skin: Skin is warm and dry. No rash noted. She is not diaphoretic. No erythema. No pallor.  Psychiatric: She has a normal mood and affect. Her behavior is normal. Judgment and thought content normal.     EKG: Atrial fibrillation with a ventricular rate of 80 beats per minute. Nonspecific T wave changes.   ASSESSMENT AND PLAN

## 2011-11-29 NOTE — Assessment & Plan Note (Signed)
Her blood pressure is well controlled now. No evidence of renal artery stenosis in 2010. Continue current medications.

## 2011-11-29 NOTE — Patient Instructions (Signed)
Your physician wants you to follow-up in: 6 months in [place]. You will receive a reminder letter in the mail one-two months in advance. If you don't receive a letter, please call our office to schedule the follow-up appointment. Your physician recommends that you continue on your current medications as directed. Please refer to the Current Medication list given to you today.

## 2011-11-29 NOTE — Assessment & Plan Note (Signed)
Continue rate control with carvedilol and long-term anticoagulation with warfarin which is being managed by Dr. [name]. She is scheduled for an EGD and colonoscopy next week. Warfarin can be stopped 5 days before the procedure. It should be resumed after as soon as safe.

## 2011-11-29 NOTE — Assessment & Plan Note (Signed)
She seems to be euvolemic at this time. It appears that the combination of spironolactone and small dose furosemide has improved her overall fluid status. Her renal function is also stable and is no evidence of hyperkalemia. She is on some potassium supplements. I recommend checking a basic metabolic profile every 6 months to ensure that she does not have hyperkalemia. Her most recent potassium was 4.4.

## 2011-12-03 DIAGNOSIS — Z7901 Long term (current) use of anticoagulants: Secondary | ICD-10-CM | POA: Diagnosis not present

## 2011-12-03 DIAGNOSIS — Z5181 Encounter for therapeutic drug level monitoring: Secondary | ICD-10-CM | POA: Diagnosis not present

## 2011-12-04 DIAGNOSIS — K644 Residual hemorrhoidal skin tags: Secondary | ICD-10-CM | POA: Diagnosis not present

## 2011-12-04 DIAGNOSIS — K591 Functional diarrhea: Secondary | ICD-10-CM | POA: Diagnosis not present

## 2011-12-04 DIAGNOSIS — E119 Type 2 diabetes mellitus without complications: Secondary | ICD-10-CM | POA: Diagnosis not present

## 2011-12-04 DIAGNOSIS — Z7901 Long term (current) use of anticoagulants: Secondary | ICD-10-CM | POA: Diagnosis not present

## 2011-12-04 DIAGNOSIS — D126 Benign neoplasm of colon, unspecified: Secondary | ICD-10-CM | POA: Diagnosis not present

## 2011-12-04 DIAGNOSIS — Z79899 Other long term (current) drug therapy: Secondary | ICD-10-CM | POA: Diagnosis not present

## 2011-12-04 DIAGNOSIS — I10 Essential (primary) hypertension: Secondary | ICD-10-CM | POA: Diagnosis not present

## 2011-12-04 DIAGNOSIS — Z800 Family history of malignant neoplasm of digestive organs: Secondary | ICD-10-CM | POA: Diagnosis not present

## 2011-12-04 DIAGNOSIS — I4891 Unspecified atrial fibrillation: Secondary | ICD-10-CM | POA: Diagnosis not present

## 2011-12-04 DIAGNOSIS — Z8673 Personal history of transient ischemic attack (TIA), and cerebral infarction without residual deficits: Secondary | ICD-10-CM | POA: Diagnosis not present

## 2011-12-04 DIAGNOSIS — E7800 Pure hypercholesterolemia, unspecified: Secondary | ICD-10-CM | POA: Diagnosis not present

## 2011-12-04 DIAGNOSIS — K2289 Other specified disease of esophagus: Secondary | ICD-10-CM | POA: Diagnosis not present

## 2011-12-04 DIAGNOSIS — Z7982 Long term (current) use of aspirin: Secondary | ICD-10-CM | POA: Diagnosis not present

## 2011-12-04 DIAGNOSIS — K648 Other hemorrhoids: Secondary | ICD-10-CM | POA: Diagnosis not present

## 2011-12-04 DIAGNOSIS — K5730 Diverticulosis of large intestine without perforation or abscess without bleeding: Secondary | ICD-10-CM | POA: Diagnosis not present

## 2011-12-04 DIAGNOSIS — R1310 Dysphagia, unspecified: Secondary | ICD-10-CM | POA: Diagnosis not present

## 2011-12-04 DIAGNOSIS — Z1211 Encounter for screening for malignant neoplasm of colon: Secondary | ICD-10-CM | POA: Diagnosis not present

## 2011-12-04 DIAGNOSIS — I509 Heart failure, unspecified: Secondary | ICD-10-CM | POA: Diagnosis not present

## 2011-12-07 DIAGNOSIS — Z7901 Long term (current) use of anticoagulants: Secondary | ICD-10-CM | POA: Diagnosis not present

## 2011-12-14 DIAGNOSIS — R791 Abnormal coagulation profile: Secondary | ICD-10-CM | POA: Diagnosis not present

## 2011-12-25 DIAGNOSIS — H2510 Age-related nuclear cataract, unspecified eye: Secondary | ICD-10-CM | POA: Diagnosis not present

## 2011-12-27 DIAGNOSIS — R791 Abnormal coagulation profile: Secondary | ICD-10-CM | POA: Diagnosis not present

## 2011-12-29 ENCOUNTER — Other Ambulatory Visit: Payer: Self-pay | Admitting: Cardiovascular Disease

## 2012-01-14 DIAGNOSIS — R1310 Dysphagia, unspecified: Secondary | ICD-10-CM | POA: Diagnosis not present

## 2012-01-29 DIAGNOSIS — Z7901 Long term (current) use of anticoagulants: Secondary | ICD-10-CM | POA: Diagnosis not present

## 2012-02-07 ENCOUNTER — Encounter: Payer: Self-pay | Admitting: Cardiovascular Disease

## 2012-02-07 DIAGNOSIS — I4891 Unspecified atrial fibrillation: Secondary | ICD-10-CM | POA: Diagnosis not present

## 2012-02-07 DIAGNOSIS — I10 Essential (primary) hypertension: Secondary | ICD-10-CM | POA: Diagnosis not present

## 2012-02-07 DIAGNOSIS — I5032 Chronic diastolic (congestive) heart failure: Secondary | ICD-10-CM | POA: Diagnosis not present

## 2012-02-07 DIAGNOSIS — IMO0001 Reserved for inherently not codable concepts without codable children: Secondary | ICD-10-CM | POA: Diagnosis not present

## 2012-02-07 DIAGNOSIS — Z6838 Body mass index (BMI) 38.0-38.9, adult: Secondary | ICD-10-CM | POA: Diagnosis not present

## 2012-02-07 DIAGNOSIS — E785 Hyperlipidemia, unspecified: Secondary | ICD-10-CM | POA: Diagnosis not present

## 2012-02-29 DIAGNOSIS — Z7901 Long term (current) use of anticoagulants: Secondary | ICD-10-CM | POA: Diagnosis not present

## 2012-03-12 ENCOUNTER — Encounter: Payer: Self-pay | Admitting: Cardiovascular Disease

## 2012-04-01 ENCOUNTER — Other Ambulatory Visit: Payer: Self-pay | Admitting: Cardiovascular Disease

## 2012-04-01 NOTE — Telephone Encounter (Signed)
Error

## 2012-04-01 NOTE — Telephone Encounter (Signed)
Pt daughter called for a refill on pt medication. States that she is to be on teckturna i do not see this on her medication list. Also states pt has NEVER been on coumadin and it is on her list.

## 2012-04-02 DIAGNOSIS — Z7901 Long term (current) use of anticoagulants: Secondary | ICD-10-CM | POA: Diagnosis not present

## 2012-04-15 ENCOUNTER — Other Ambulatory Visit: Payer: Self-pay | Admitting: *Deleted

## 2012-04-15 MED ORDER — SPIRONOLACTONE 25 MG PO TABS: 25.0000 mg | ORAL_TABLET | Freq: Every day | ORAL | Status: DC

## 2012-05-05 DIAGNOSIS — R791 Abnormal coagulation profile: Secondary | ICD-10-CM | POA: Diagnosis not present

## 2012-05-06 DIAGNOSIS — R791 Abnormal coagulation profile: Secondary | ICD-10-CM | POA: Diagnosis not present

## 2012-05-07 DIAGNOSIS — R791 Abnormal coagulation profile: Secondary | ICD-10-CM | POA: Diagnosis not present

## 2012-05-09 DIAGNOSIS — R791 Abnormal coagulation profile: Secondary | ICD-10-CM | POA: Diagnosis not present

## 2012-05-12 DIAGNOSIS — R791 Abnormal coagulation profile: Secondary | ICD-10-CM | POA: Diagnosis not present

## 2012-05-15 DIAGNOSIS — M542 Cervicalgia: Secondary | ICD-10-CM | POA: Diagnosis not present

## 2012-05-15 DIAGNOSIS — IMO0001 Reserved for inherently not codable concepts without codable children: Secondary | ICD-10-CM | POA: Diagnosis not present

## 2012-05-15 DIAGNOSIS — I6350 Cerebral infarction due to unspecified occlusion or stenosis of unspecified cerebral artery: Secondary | ICD-10-CM | POA: Diagnosis not present

## 2012-05-15 DIAGNOSIS — I10 Essential (primary) hypertension: Secondary | ICD-10-CM | POA: Diagnosis not present

## 2012-05-15 DIAGNOSIS — E785 Hyperlipidemia, unspecified: Secondary | ICD-10-CM | POA: Diagnosis not present

## 2012-05-15 DIAGNOSIS — I4891 Unspecified atrial fibrillation: Secondary | ICD-10-CM | POA: Diagnosis not present

## 2012-05-15 DIAGNOSIS — Z6838 Body mass index (BMI) 38.0-38.9, adult: Secondary | ICD-10-CM | POA: Diagnosis not present

## 2012-05-15 DIAGNOSIS — R791 Abnormal coagulation profile: Secondary | ICD-10-CM | POA: Diagnosis not present

## 2012-05-22 DIAGNOSIS — R791 Abnormal coagulation profile: Secondary | ICD-10-CM | POA: Diagnosis not present

## 2012-05-28 ENCOUNTER — Ambulatory Visit (INDEPENDENT_AMBULATORY_CARE_PROVIDER_SITE_OTHER): Payer: Medicare Other | Admitting: Cardiovascular Disease

## 2012-05-28 ENCOUNTER — Encounter: Payer: Self-pay | Admitting: Cardiovascular Disease

## 2012-05-28 VITALS — BP 109/69 | HR 65 | Ht 64.0 in | Wt 219.1 lb

## 2012-05-28 DIAGNOSIS — I5032 Chronic diastolic (congestive) heart failure: Secondary | ICD-10-CM | POA: Diagnosis not present

## 2012-05-28 DIAGNOSIS — I10 Essential (primary) hypertension: Secondary | ICD-10-CM

## 2012-05-28 DIAGNOSIS — Z01818 Encounter for other preprocedural examination: Secondary | ICD-10-CM | POA: Diagnosis not present

## 2012-05-28 DIAGNOSIS — I4891 Unspecified atrial fibrillation: Secondary | ICD-10-CM | POA: Diagnosis not present

## 2012-05-28 DIAGNOSIS — I2510 Atherosclerotic heart disease of native coronary artery without angina pectoris: Secondary | ICD-10-CM | POA: Diagnosis not present

## 2012-05-28 NOTE — Progress Notes (Signed)
HPI  This is a 77 year old female who is here today for a followup visit regarding chronic atrial fibrillation, refractory hypertension and diastolic heart failure. She had a cardiac catheterization done in 2010 which showed mild nonobstructive coronary artery disease. She has been doing reasonably well. She denies any chest pain or palpitations. Her dyspnea is stable. She has chronic lower extremity edema which has not worsened. She does complain of exertional dyspnea with only slight worsening. She has been complaining of increased headache recently. She did undergo a CT scan and neck x-ray. Both of them were unremarkable.  Allergies  Allergen Reactions  . Iodinated Diagnostic Agents   . Penicillins      Current Outpatient Prescriptions on File Prior to Visit  Medication Sig Dispense Refill  . amLODipine (NORVASC) 5 MG tablet TAKE ONE TABLET BY MOUTH EVERY DAY  30 tablet  6  . aspirin 81 MG EC tablet Take 81 mg by mouth daily.        Marland Kitchen atorvastatin (LIPITOR) 40 MG tablet Take 40 mg by mouth daily.      . benazepril (LOTENSIN) 20 MG tablet Take 20 mg by mouth 2 (two) times daily.        . carvedilol (COREG) 25 MG tablet TAKE ONE TABLET BY MOUTH TWICE DAILY  60 tablet  6  . fish oil-omega-3 fatty acids 1000 MG capsule Take 2 g by mouth 2 (two) times daily.       . furosemide (LASIX) 40 MG tablet Take 20 mg by mouth daily.       Marland Kitchen glipiZIDE (GLUCOTROL XL) 5 MG 24 hr tablet Take 5 mg by mouth daily.        . isosorbide mononitrate (IMDUR) 60 MG 24 hr tablet Take 60 mg by mouth daily.        . metFORMIN (GLUCOPHAGE) 500 MG tablet Take 500 mg by mouth 2 (two) times daily with a meal.        . potassium chloride (KLOR-CON) 10 MEQ CR tablet Take 10 mEq by mouth daily.        Marland Kitchen spironolactone (ALDACTONE) 25 MG tablet Take 1 tablet (25 mg total) by mouth daily.  30 tablet  6  . warfarin (COUMADIN) 5 MG tablet Take 5 mg by mouth as directed.           Past Medical History  Diagnosis Date    . UTI (lower urinary tract infection)   . Swelling of limb   . Varicose veins of lower extremities with other complications   . Osteoarthrosis, unspecified whether generalized or localized, unspecified site   . Lumbosacral spondylosis without myelopathy   . Hemorrhoids   . DM2 (diabetes mellitus, type 2)   . Colonic polyp   . Degeneration of lumbar or lumbosacral intervertebral disc   . Diverticulosis   . HLD (hyperlipidemia)   . Pain in thoracic spine   . Atrial fibrillation 2010  . HTN (hypertension)     diagnosed when she was in her 71s. Refractory. No RAS by [place] in 2010  . Coronary artery disease     mild non obstructive disease per cardiac cath in 2010  . Congestive heart failure, unspecified   . Chronic diastolic heart failure      Past Surgical History  Procedure Date  . Tonsillectomy   . Adenoidectomy   . Cardiac catheterization 2010    At [place]. Mild nonobstructive CAD     Family History  Problem Relation  Age of Onset  . Heart disease    . Cancer Sister   . Stroke Mother      History   Social History  . Marital Status: Unknown    Spouse Name: N/A    Number of Children: N/A  . Years of Education: N/A   Occupational History  . retired    Social History Main Topics  . Smoking status: Never Smoker   . Smokeless tobacco: Not on file  . Alcohol Use: No  . Drug Use: Not on file  . Sexually Active: Not on file   Other Topics Concern  . Not on file   Social History Narrative  . No narrative on file      PHYSICAL EXAM   BP 109/69  Pulse 65  Ht _0  (1.626 m)  Wt 219 lb 1.9 oz (99.392 kg)  BMI 37.61 kg/m2 Constitutional: She is oriented to person, place, and time. She appears well-developed and well-nourished. No distress.  HENT: No nasal discharge.  Head: Normocephalic and atraumatic.  Eyes: Pupils are equal and round. Right eye exhibits no discharge. Left eye exhibits no discharge.  Neck: Normal range of motion. Neck supple. No  JVD present. No thyromegaly present.  Cardiovascular: Normal rate, irregular rhythm, normal heart sounds. Exam reveals no gallop and no friction rub. No murmur heard.  Pulmonary/Chest: Effort normal and breath sounds normal. No stridor. No respiratory distress. She has no wheezes. She has no rales. She exhibits no tenderness.  Abdominal: Soft. Bowel sounds are normal. She exhibits no distension. There is no tenderness. There is no rebound and no guarding.  Musculoskeletal: Normal range of motion. She exhibits +1 edema and no tenderness.  Neurological: She is alert and oriented to person, place, and time. Coordination normal.  Skin: Skin is warm and dry. No rash noted. She is not diaphoretic. No erythema. No pallor.  Psychiatric: She has a normal mood and affect. Her behavior is normal. Judgment and thought content normal.     EKG: Atrial fibrillation with nonspecific ST and T wave changes.   ASSESSMENT AND PLAN

## 2012-05-28 NOTE — Assessment & Plan Note (Signed)
This was mild on her cardiac catheterization in 2010. Continue medical therapy. I recommend a target LDL of less than 100. Recent lipid profile showed an LDL of 83.

## 2012-05-28 NOTE — Assessment & Plan Note (Signed)
Her blood pressure is somewhat low. I recommend stopping hydralazine. If her pressure remains low after that, isosorbide can be discontinued.

## 2012-05-28 NOTE — Assessment & Plan Note (Signed)
She seems to be euvolemic on current dose of Lasix and Aldactone. She had recent basic metabolic profile which showed a creatinine of 0.90 with a. Potassium of 4.3. CBC was unremarkable.

## 2012-05-28 NOTE — Assessment & Plan Note (Addendum)
Continue rate control with carvedilol and long-term anticoagulation with warfarin which is being managed by Dr. [name]. She is supposed to have cataract surgery done next month. She should be considered at a low to moderate risk for that surgery. No further cardiac testing is needed before that. Warfarin can be stopped 5 days before the surgery.

## 2012-05-28 NOTE — Patient Instructions (Addendum)
Stop taking Hydralazine. Continue other medications.  Stop Warfarin 5 days before surgery.  Follow up in 6 months.

## 2012-05-29 DIAGNOSIS — R791 Abnormal coagulation profile: Secondary | ICD-10-CM | POA: Diagnosis not present

## 2012-06-12 ENCOUNTER — Telehealth: Payer: Self-pay | Admitting: Cardiovascular Disease

## 2012-06-12 DIAGNOSIS — I10 Essential (primary) hypertension: Secondary | ICD-10-CM | POA: Diagnosis not present

## 2012-06-12 DIAGNOSIS — Z6838 Body mass index (BMI) 38.0-38.9, adult: Secondary | ICD-10-CM | POA: Diagnosis not present

## 2012-06-12 DIAGNOSIS — R791 Abnormal coagulation profile: Secondary | ICD-10-CM | POA: Diagnosis not present

## 2012-06-12 NOTE — Telephone Encounter (Signed)
Pt wants to confirm whether or not she should stop coumadin for her cataract surgery for next week.  The physician's nurse told her that she didn't have to be off of it, however, she states Dr. Fletcher Anon said to stop taking it for 5 days.  Please call advise.

## 2012-06-13 NOTE — Telephone Encounter (Signed)
I explained to pt it is up to surgeon to determine if pt needs to hold warfarin for surg or not.  But if he does want pt to hold this, we are ok with her holding x 5 days prior to.  She verb. Understanding.

## 2012-06-16 DIAGNOSIS — H269 Unspecified cataract: Secondary | ICD-10-CM | POA: Diagnosis not present

## 2012-06-16 DIAGNOSIS — H2510 Age-related nuclear cataract, unspecified eye: Secondary | ICD-10-CM | POA: Diagnosis not present

## 2012-06-17 DIAGNOSIS — H2510 Age-related nuclear cataract, unspecified eye: Secondary | ICD-10-CM | POA: Diagnosis not present

## 2012-07-07 DIAGNOSIS — Z7901 Long term (current) use of anticoagulants: Secondary | ICD-10-CM | POA: Diagnosis not present

## 2012-08-15 DIAGNOSIS — Z7901 Long term (current) use of anticoagulants: Secondary | ICD-10-CM | POA: Diagnosis not present

## 2012-08-20 DIAGNOSIS — I10 Essential (primary) hypertension: Secondary | ICD-10-CM | POA: Diagnosis not present

## 2012-08-20 DIAGNOSIS — I4891 Unspecified atrial fibrillation: Secondary | ICD-10-CM | POA: Diagnosis not present

## 2012-08-20 DIAGNOSIS — E785 Hyperlipidemia, unspecified: Secondary | ICD-10-CM | POA: Diagnosis not present

## 2012-08-20 DIAGNOSIS — Z6838 Body mass index (BMI) 38.0-38.9, adult: Secondary | ICD-10-CM | POA: Diagnosis not present

## 2012-08-20 DIAGNOSIS — IMO0001 Reserved for inherently not codable concepts without codable children: Secondary | ICD-10-CM | POA: Diagnosis not present

## 2012-09-03 DIAGNOSIS — N951 Menopausal and female climacteric states: Secondary | ICD-10-CM | POA: Diagnosis not present

## 2012-09-03 DIAGNOSIS — Z1231 Encounter for screening mammogram for malignant neoplasm of breast: Secondary | ICD-10-CM | POA: Diagnosis not present

## 2012-09-05 DIAGNOSIS — Z23 Encounter for immunization: Secondary | ICD-10-CM | POA: Diagnosis not present

## 2012-09-15 ENCOUNTER — Other Ambulatory Visit: Payer: Self-pay | Admitting: *Deleted

## 2012-09-15 DIAGNOSIS — Z7901 Long term (current) use of anticoagulants: Secondary | ICD-10-CM | POA: Diagnosis not present

## 2012-09-15 MED ORDER — SPIRONOLACTONE 25 MG PO TABS: 25.0000 mg | ORAL_TABLET | Freq: Every day | ORAL | Status: DC

## 2012-09-15 NOTE — Telephone Encounter (Signed)
Refilled Spironolactone.

## 2012-10-21 DIAGNOSIS — Z7901 Long term (current) use of anticoagulants: Secondary | ICD-10-CM | POA: Diagnosis not present

## 2012-11-19 ENCOUNTER — Ambulatory Visit: Payer: Medicare Other | Admitting: Cardiovascular Disease

## 2012-11-24 DIAGNOSIS — Z7901 Long term (current) use of anticoagulants: Secondary | ICD-10-CM | POA: Diagnosis not present

## 2012-11-26 ENCOUNTER — Ambulatory Visit (INDEPENDENT_AMBULATORY_CARE_PROVIDER_SITE_OTHER): Payer: Medicare Other | Admitting: Cardiovascular Disease

## 2012-11-26 ENCOUNTER — Encounter: Payer: Self-pay | Admitting: Cardiovascular Disease

## 2012-11-26 VITALS — BP 116/62 | HR 88 | Ht 64.0 in | Wt 219.0 lb

## 2012-11-26 DIAGNOSIS — I10 Essential (primary) hypertension: Secondary | ICD-10-CM

## 2012-11-26 DIAGNOSIS — I4891 Unspecified atrial fibrillation: Secondary | ICD-10-CM | POA: Diagnosis not present

## 2012-11-26 DIAGNOSIS — R0609 Other forms of dyspnea: Secondary | ICD-10-CM | POA: Diagnosis not present

## 2012-11-26 DIAGNOSIS — R0600 Dyspnea, unspecified: Secondary | ICD-10-CM

## 2012-11-26 DIAGNOSIS — M79604 Pain in right leg: Secondary | ICD-10-CM | POA: Insufficient documentation

## 2012-11-26 DIAGNOSIS — I5032 Chronic diastolic (congestive) heart failure: Secondary | ICD-10-CM

## 2012-11-26 DIAGNOSIS — M79605 Pain in left leg: Secondary | ICD-10-CM | POA: Insufficient documentation

## 2012-11-26 DIAGNOSIS — M79609 Pain in unspecified limb: Secondary | ICD-10-CM

## 2012-11-26 NOTE — Assessment & Plan Note (Signed)
Ventricular rate seems to be reasonably controlled at rest. However, she is complaining of palpitations with changing position and with physical activities. I will request a 48-hour Holter monitor to make sure that her rate control is optimal. If not, I might consider switching her from carvedilol to metoprolol.

## 2012-11-26 NOTE — Progress Notes (Signed)
HPI  This is a 78 year old female who is here today for a followup visit regarding chronic atrial fibrillation, refractory hypertension and diastolic heart failure. She had a cardiac catheterization done in 2010 which showed mild nonobstructive coronary artery disease. She has chronic lower extremity edema which has not worsened. She complains of significant worsening of exertional dyspnea without chest pain. She also complains of palpitations especially with changing position. Her functional capacity has deteriorated over the last year. She had significant discomfort in both legs especially with walking up one flight of stairs. She feels that her joints are very stiff. There is no orthopnea or PND.  Allergies  Allergen Reactions  . Iodinated Diagnostic Agents   . Penicillins      Current Outpatient Prescriptions on File Prior to Visit  Medication Sig Dispense Refill  . amLODipine (NORVASC) 5 MG tablet TAKE ONE TABLET BY MOUTH EVERY DAY  30 tablet  6  . aspirin 81 MG EC tablet Take 81 mg by mouth daily.        Marland Kitchen atorvastatin (LIPITOR) 40 MG tablet Take 40 mg by mouth daily.      . benazepril (LOTENSIN) 20 MG tablet Take 20 mg by mouth 2 (two) times daily.        . carvedilol (COREG) 25 MG tablet TAKE ONE TABLET BY MOUTH TWICE DAILY  60 tablet  6  . fish oil-omega-3 fatty acids 1000 MG capsule Take 2 g by mouth 2 (two) times daily.       . furosemide (LASIX) 40 MG tablet Take 20 mg by mouth daily.       Marland Kitchen glipiZIDE (GLUCOTROL XL) 5 MG 24 hr tablet Take 5 mg by mouth daily.        . isosorbide mononitrate (IMDUR) 60 MG 24 hr tablet Take 60 mg by mouth daily.        . metFORMIN (GLUCOPHAGE) 500 MG tablet Take 500 mg by mouth 2 (two) times daily with a meal.        . potassium chloride (KLOR-CON) 10 MEQ CR tablet Take 10 mEq by mouth daily.        Marland Kitchen spironolactone (ALDACTONE) 25 MG tablet Take 1 tablet (25 mg total) by mouth daily.  30 tablet  6  . warfarin (COUMADIN) 5 MG tablet Take 5 mg  by mouth as directed.         No current facility-administered medications on file prior to visit.     Past Medical History  Diagnosis Date  . UTI (lower urinary tract infection)   . Swelling of limb   . Varicose veins of lower extremities with other complications   . Osteoarthrosis, unspecified whether generalized or localized, unspecified site   . Lumbosacral spondylosis without myelopathy   . Hemorrhoids   . DM2 (diabetes mellitus, type 2)   . Colonic polyp   . Degeneration of lumbar or lumbosacral intervertebral disc   . Diverticulosis   . HLD (hyperlipidemia)   . Pain in thoracic spine   . Atrial fibrillation 2010  . HTN (hypertension)     diagnosed when she was in her 63s. Refractory. No RAS by [place] in 2010  . Coronary artery disease     mild non obstructive disease per cardiac cath in 2010  . Congestive heart failure, unspecified   . Chronic diastolic heart failure      Past Surgical History  Procedure Laterality Date  . Tonsillectomy    . Adenoidectomy    .  Cardiac catheterization  2010    At [place]. Mild nonobstructive CAD     Family History  Problem Relation Age of Onset  . Heart disease    . Cancer Sister   . Stroke Mother      History   Social History  . Marital Status: Unknown    Spouse Name: N/A    Number of Children: N/A  . Years of Education: N/A   Occupational History  . retired    Social History Main Topics  . Smoking status: Never Smoker   . Smokeless tobacco: Not on file  . Alcohol Use: No  . Drug Use: Not on file  . Sexually Active: Not on file   Other Topics Concern  . Not on file   Social History Narrative  . No narrative on file      PHYSICAL EXAM   BP 116/62  Pulse 88  Ht 5' 4" (1.626 m)  Wt 219 lb (99.338 kg)  BMI 37.57 kg/m2  SpO2 97% Constitutional: She is oriented to person, place, and time. She appears well-developed and well-nourished. No distress.  HENT: No nasal discharge.  Head: Normocephalic  and atraumatic.  Eyes: Pupils are equal and round. Right eye exhibits no discharge. Left eye exhibits no discharge.  Neck: Normal range of motion. Neck supple. No JVD present. No thyromegaly present.  Cardiovascular: Normal rate, irregular rhythm, normal heart sounds. Exam reveals no gallop and no friction rub. No murmur heard.  Pulmonary/Chest: Effort normal and breath sounds normal. No stridor. No respiratory distress. She has no wheezes. She has no rales. She exhibits no tenderness.  Abdominal: Soft. Bowel sounds are normal. She exhibits no distension. There is no tenderness. There is no rebound and no guarding.  Musculoskeletal: Normal range of motion. She exhibits +1 edema and no tenderness.  Neurological: She is alert and oriented to person, place, and time. Coordination normal.  Skin: Skin is warm and dry. No rash noted. She is not diaphoretic. No erythema. No pallor.  Psychiatric: She has a normal mood and affect. Her behavior is normal. Judgment and thought content normal.  Distal pulses are mildly diminished.     ASSESSMENT AND PLAN

## 2012-11-26 NOTE — Assessment & Plan Note (Signed)
I suspect that her symptoms are due to arthritis. However, he does have mildly diminished distal pulses with risk factors for PAD. Thus, I will obtain an ABI. She might benefit from physical therapy.

## 2012-11-26 NOTE — Patient Instructions (Addendum)
Your physician has requested that you have an echocardiogram. Echocardiography is a painless test that uses sound waves to create images of your heart. It provides your doctor with information about the size and shape of your heart and how well your heart's chambers and valves are working. This procedure takes approximately one hour. There are no restrictions for this procedure.  Your physician has requested that you have an ankle brachial index (ABI). During this test an ultrasound and blood pressure cuff are used to evaluate the arteries that supply the arms and legs with blood. Allow thirty minutes for this exam. There are no restrictions or special instructions.  Your physician has recommended that you wear a holter monitor. Holter monitors are medical devices that record the heart's electrical activity. Doctors most often use these monitors to diagnose arrhythmias. Arrhythmias are problems with the speed or rhythm of the heartbeat. The monitor is a small, portable device. You can wear one while you do your normal daily activities. This is usually used to diagnose what is causing palpitations/syncope (passing out).  Your physician wants you to follow-up in: 6 months.   You will receive a reminder letter in the mail two months in advance. If you don't receive a letter, please call our office to schedule the follow-up appointment.

## 2012-11-26 NOTE — Assessment & Plan Note (Signed)
Blood pressure is reasonably controlled. Hydralazine was resumed by Dr. Elissa Hefty do to worsening blood pressure. She is taking the medication only once a day.

## 2012-11-26 NOTE — Assessment & Plan Note (Signed)
She reports worsening exertional dyspnea. She had no recent echocardiogram. Previous ejection fraction was normal in 2010. I will request an echocardiogram for followup.

## 2012-12-03 ENCOUNTER — Encounter: Payer: Self-pay | Admitting: Cardiovascular Disease

## 2012-12-03 ENCOUNTER — Ambulatory Visit (INDEPENDENT_AMBULATORY_CARE_PROVIDER_SITE_OTHER): Payer: Medicare Other | Admitting: Cardiology

## 2012-12-03 DIAGNOSIS — I739 Peripheral vascular disease, unspecified: Secondary | ICD-10-CM

## 2012-12-03 DIAGNOSIS — I10 Essential (primary) hypertension: Secondary | ICD-10-CM | POA: Diagnosis not present

## 2012-12-03 DIAGNOSIS — I4891 Unspecified atrial fibrillation: Secondary | ICD-10-CM | POA: Diagnosis not present

## 2012-12-03 DIAGNOSIS — E785 Hyperlipidemia, unspecified: Secondary | ICD-10-CM | POA: Diagnosis not present

## 2012-12-03 DIAGNOSIS — I70219 Atherosclerosis of native arteries of extremities with intermittent claudication, unspecified extremity: Secondary | ICD-10-CM

## 2012-12-03 DIAGNOSIS — Z6838 Body mass index (BMI) 38.0-38.9, adult: Secondary | ICD-10-CM | POA: Diagnosis not present

## 2012-12-05 ENCOUNTER — Other Ambulatory Visit (HOSPITAL_COMMUNITY): Payer: Medicare Other

## 2012-12-12 ENCOUNTER — Ambulatory Visit (INDEPENDENT_AMBULATORY_CARE_PROVIDER_SITE_OTHER): Payer: Medicare Other

## 2012-12-12 ENCOUNTER — Ambulatory Visit (HOSPITAL_COMMUNITY): Payer: Medicare Other | Attending: Cardiovascular Disease

## 2012-12-12 DIAGNOSIS — R0600 Dyspnea, unspecified: Secondary | ICD-10-CM

## 2012-12-12 DIAGNOSIS — I079 Rheumatic tricuspid valve disease, unspecified: Secondary | ICD-10-CM | POA: Diagnosis not present

## 2012-12-12 DIAGNOSIS — I4891 Unspecified atrial fibrillation: Secondary | ICD-10-CM | POA: Diagnosis not present

## 2012-12-12 DIAGNOSIS — I509 Heart failure, unspecified: Secondary | ICD-10-CM | POA: Diagnosis not present

## 2012-12-12 DIAGNOSIS — I359 Nonrheumatic aortic valve disorder, unspecified: Secondary | ICD-10-CM | POA: Insufficient documentation

## 2012-12-12 NOTE — Progress Notes (Signed)
Placed patient on a 24 hr holter monitor and went over instruction on how to use it and when to return it

## 2012-12-12 NOTE — Progress Notes (Signed)
Echocardiogram performed.  

## 2012-12-26 DIAGNOSIS — Z7901 Long term (current) use of anticoagulants: Secondary | ICD-10-CM | POA: Diagnosis not present

## 2012-12-30 ENCOUNTER — Telehealth: Payer: Self-pay

## 2012-12-30 NOTE — Telephone Encounter (Signed)
Monitor results per Dr Fletcher Anon are chronic atrial fib with good rate control.  No answer at pt number.  LMTCB.

## 2012-12-30 NOTE — Telephone Encounter (Signed)
Pt was notified of monitor results and need to stay on current meds per Dr Fletcher Anon.  She agrees.

## 2013-02-02 DIAGNOSIS — Z7901 Long term (current) use of anticoagulants: Secondary | ICD-10-CM | POA: Diagnosis not present

## 2013-03-05 DIAGNOSIS — Z7901 Long term (current) use of anticoagulants: Secondary | ICD-10-CM | POA: Diagnosis not present

## 2013-03-12 DIAGNOSIS — I10 Essential (primary) hypertension: Secondary | ICD-10-CM | POA: Diagnosis not present

## 2013-03-12 DIAGNOSIS — I4891 Unspecified atrial fibrillation: Secondary | ICD-10-CM | POA: Diagnosis not present

## 2013-03-12 DIAGNOSIS — I2510 Atherosclerotic heart disease of native coronary artery without angina pectoris: Secondary | ICD-10-CM | POA: Diagnosis not present

## 2013-03-12 DIAGNOSIS — Z1331 Encounter for screening for depression: Secondary | ICD-10-CM | POA: Diagnosis not present

## 2013-03-12 DIAGNOSIS — Z6837 Body mass index (BMI) 37.0-37.9, adult: Secondary | ICD-10-CM | POA: Diagnosis not present

## 2013-03-12 DIAGNOSIS — E785 Hyperlipidemia, unspecified: Secondary | ICD-10-CM | POA: Diagnosis not present

## 2013-03-12 DIAGNOSIS — Z9181 History of falling: Secondary | ICD-10-CM | POA: Diagnosis not present

## 2013-03-12 DIAGNOSIS — IMO0001 Reserved for inherently not codable concepts without codable children: Secondary | ICD-10-CM | POA: Diagnosis not present

## 2013-03-25 ENCOUNTER — Encounter: Payer: Self-pay | Admitting: Nurse Practitioner

## 2013-03-25 ENCOUNTER — Ambulatory Visit (INDEPENDENT_AMBULATORY_CARE_PROVIDER_SITE_OTHER): Payer: Medicare Other | Admitting: Nurse Practitioner

## 2013-03-25 VITALS — BP 100/68 | HR 84 | Ht 64.0 in | Wt 212.8 lb

## 2013-03-25 DIAGNOSIS — R079 Chest pain, unspecified: Secondary | ICD-10-CM

## 2013-03-25 DIAGNOSIS — I2510 Atherosclerotic heart disease of native coronary artery without angina pectoris: Secondary | ICD-10-CM | POA: Diagnosis not present

## 2013-03-25 MED ORDER — NITROGLYCERIN 0.4 MG SL SUBL: 0.4000 mg | SUBLINGUAL_TABLET | SUBLINGUAL | Status: DC | PRN

## 2013-03-25 NOTE — Progress Notes (Signed)
Edward Jolly Date of Birth: Jul 14, 1935 Medical Record #[id number]  History of Present Illness: Ms. Chenard is seen back today for a work in visit. Seen for Dr. Fletcher Anon. She has chronic atrial fib, HTN, and diastolic heart failure. Has nonobstructive CAD per cath back in 2010. Has chronic lower extremity edema and palpitations (especially with change in position). She is not compliant with her medicines due to cost.   Last seen in February. She had multiple complaints with palpitations, worsening DOE and leg pain. She had an echo, Holter and ABI's performed. This was all just fine.   Comes back today at the request of her PCP. Still with chest pain. Described as a "little pressure". It's there most of the times. No precipitating or relieving factors. No NTG use. Still with palpitations, especially if she bends over. Gets aching with almost any exertion. Still with DOE - thinks she needs a walker with a seat so she could walk more. Very sedentary. Not taking some of her medicines due to cost. Does not check her BP at home. Wants off some of her medicines. Very worried that something is going to happen to her but too anxious about having any test, thinks that it will cause a heart attack. Has chronic swelling. Her weight is down. She tells me she has to crawl up steps. Very limited by back and leg pain.    Current Outpatient Prescriptions  Medication Sig Dispense Refill  . amLODipine (NORVASC) 5 MG tablet TAKE ONE TABLET BY MOUTH EVERY DAY  30 tablet  6  . aspirin 81 MG EC tablet Take 81 mg by mouth daily.        Marland Kitchen atorvastatin (LIPITOR) 40 MG tablet Take 40 mg by mouth daily.      . benazepril (LOTENSIN) 20 MG tablet Take 20 mg by mouth 2 (two) times daily.        . carvedilol (COREG) 25 MG tablet TAKE ONE TABLET BY MOUTH TWICE DAILY  60 tablet  6  . fish oil-omega-3 fatty acids 1000 MG capsule Take 2 g by mouth 2 (two) times daily.       . furosemide (LASIX) 40 MG tablet Take 20 mg by mouth daily.        Marland Kitchen glipiZIDE (GLUCOTROL XL) 5 MG 24 hr tablet Take 5 mg by mouth daily.        . hydrALAZINE (APRESOLINE) 25 MG tablet Take 25 mg by mouth daily.      . isosorbide mononitrate (IMDUR) 60 MG 24 hr tablet Take 60 mg by mouth daily.        . metFORMIN (GLUCOPHAGE) 500 MG tablet Take 500 mg by mouth 2 (two) times daily with a meal.        . potassium chloride (KLOR-CON) 10 MEQ CR tablet Take 10 mEq by mouth daily.        Marland Kitchen spironolactone (ALDACTONE) 25 MG tablet Take 1 tablet (25 mg total) by mouth daily.  30 tablet  6  . warfarin (COUMADIN) 5 MG tablet Take 5 mg by mouth as directed.         No current facility-administered medications for this visit.    Allergies  Allergen Reactions  . Iodinated Diagnostic Agents   . Penicillins     Past Medical History  Diagnosis Date  . UTI (lower urinary tract infection)   . Swelling of limb   . Varicose veins of lower extremities with other complications   . Osteoarthrosis, unspecified whether  generalized or localized, unspecified site   . Lumbosacral spondylosis without myelopathy   . Hemorrhoids   . DM2 (diabetes mellitus, type 2)   . Colonic polyp   . Degeneration of lumbar or lumbosacral intervertebral disc   . Diverticulosis   . HLD (hyperlipidemia)   . Pain in thoracic spine   . Atrial fibrillation 2010  . HTN (hypertension)     diagnosed when she was in her 38s. Refractory. No RAS by [place] in 2010  . Coronary artery disease     mild non obstructive disease per cardiac cath in 2010  . Congestive heart failure, unspecified   . Chronic diastolic heart failure     Past Surgical History  Procedure Laterality Date  . Tonsillectomy    . Adenoidectomy    . Cardiac catheterization  2010    At [place]. Mild nonobstructive CAD    History  Smoking status  . Never Smoker   Smokeless tobacco  . Not on file    History  Alcohol Use No    Family History  Problem Relation Age of Onset  . Heart disease    . Cancer Sister     . Stroke Mother   . Heart attack Father     Review of Systems: The review of systems is per the HPI.  All other systems were reviewed and are negative.  Physical Exam: BP 100/68  Pulse 84  Ht _0  (1.626 m)  Wt 212 lb 12.8 oz (96.525 kg)  BMI 36.51 kg/m2 Patient is very pleasant and in no acute distress. She is anxious. She is obese.  Skin is warm and dry. Color is normal.  HEENT is unremarkable. Normocephalic/atraumatic. PERRL. Sclera are nonicteric. Neck is supple. No masses. No JVD. Lungs are clear. Cardiac exam shows an irregular rhythm. Her rate is controlled. Abdomen is obese but soft. Extremities are with 1+ edema. Gait and ROM are intact. No gross neurologic deficits noted.  LABORATORY DATA: checked by her PCP.  EKG from her PCP on 03/12/2013 shows atrial fib with a controlled VR.   No results found for this basename: WBC,  HGB,  HCT,  PLT,  GLUCOSE,  CHOL,  TRIG,  HDL,  LDLDIRECT,  LDLCALC,  ALT,  AST,  NA,  K,  CL,  CREATININE,  BUN,  CO2,  TSH,  PSA,  INR,  GLUF,  HGBA1C,  MICROALBUR   Echo Study Conclusions  - Left ventricle: The cavity size was normal. Wall thickness was increased in a pattern of mild LVH. The estimated ejection fraction was 60%. Wall motion was normal; there were no regional wall motion abnormalities. The study is not technically sufficient to allow evaluation of LV diastolic function. - Aortic valve: Trivial regurgitation. - Mitral valve: Calcified annulus. - Left atrium: The atrium was moderately dilated. - Right ventricle: The cavity size was mildly dilated. Systolic function was mildly reduced. - Right atrium: The atrium was mildly dilated.  ABI's were normal   Assessment / Plan: 1. Chest pain - nonobstructive CAD per prior cath from 2010 - I have recommended a stress test to further define her symptoms - she is VERY hesitant about proceeding. Will let us know if she decides to proceed. I have sent in a prescription for NTG to have on hand.    2. Atrial fib - has had recent Holter showing good rate control - her rate is controlled today.   3. Leg pain - has normal ABIs  4. Chronic diastolic  heart failure - her weight is actually down today.   5. HTN - blood pressure is ok, actually lower, she is only taking her Hydralazine once a day - I have stopped it today.   6. Deconditioning - I think this is her biggest issue - unfortunately, I do not see where she will be able to make the changes needed.   We will see her back in September as planned. She will let us know if she wants to proceed with her stress test. NTG was sent in today.   Patient is agreeable to this plan and will call if any problems develop in the interim.   Burtis Junes, RN, ANP-C [facility] [address] [place], [place]  [id number]

## 2013-03-25 NOTE — Patient Instructions (Addendum)
I would stop your Hydralazine  Try to monitor your blood pressure at home  I have recommended a stress test to evaluate your chest pain - let us know if you would like to proceed  I have sent a prescription for NTG to the drug store  Use your NTG under your tongue for recurrent chest pain. May take one tablet every 5 minutes. If you are still having discomfort after 3 tablets in 15 minutes, call 911.  Call the [facility] office at [phone] if you have any questions, problems or concerns.    2 Gram Low Sodium Diet A 2 gram sodium diet restricts the amount of sodium in the diet to no more than 2 g or 2000 mg daily. Limiting the amount of sodium is often used to help lower blood pressure. It is important if you have heart, liver, or kidney problems. Many foods contain sodium for flavor and sometimes as a preservative. When the amount of sodium in a diet needs to be low, it is important to know what to look for when choosing foods and drinks. The following includes some information and guidelines to help make it easier for you to adapt to a low sodium diet. QUICK TIPS  Do not add salt to food.  Avoid convenience items and fast food.  Choose unsalted snack foods.  Buy lower sodium products, often labeled as "lower sodium" or "no salt added."  Check food labels to learn how much sodium is in 1 serving.  When eating at a restaurant, ask that your food be prepared with less salt or none, if possible. READING FOOD LABELS FOR SODIUM INFORMATION The nutrition facts label is a good place to find how much sodium is in foods. Look for products with no more than 500 to 600 mg of sodium per meal and no more than 150 mg per serving. Remember that 2 g = 2000 mg. The food label may also list foods as:  Sodium-free: Less than 5 mg in a serving.  Very low sodium: 35 mg or less in a serving.  Low-sodium: 140 mg or less in a serving.  Light in sodium: 50% less sodium in a serving. For  example, if a food that usually has 300 mg of sodium is changed to become light in sodium, it will have 150 mg of sodium.  Reduced sodium: 25% less sodium in a serving. For example, if a food that usually has 400 mg of sodium is changed to reduced sodium, it will have 300 mg of sodium. CHOOSING FOODS Grains  Avoid: Salted crackers and snack items. Some cereals, including instant hot cereals. Bread stuffing and biscuit mixes. Seasoned rice or pasta mixes.  Choose: Unsalted snack items. Low-sodium cereals, oats, puffed wheat and rice, shredded wheat. English muffins and bread. Pasta. Meats  Avoid: Salted, canned, smoked, spiced, pickled meats, including fish and poultry. Bacon, ham, sausage, cold cuts, hot dogs, anchovies.  Choose: Low-sodium canned tuna and salmon. Fresh or frozen meat, poultry, and fish. Dairy  Avoid: Processed cheese and spreads. Cottage cheese. Buttermilk and condensed milk. Regular cheese.  Choose: Milk. Low-sodium cottage cheese. Yogurt. Sour cream. Low-sodium cheese. Fruits and Vegetables  Avoid: Regular canned vegetables. Regular canned tomato sauce and paste. Frozen vegetables in sauces. Olives. Angie Fava. Relishes. Sauerkraut.  Choose: Low-sodium canned vegetables. Low-sodium tomato sauce and paste. Frozen or fresh vegetables. Fresh and frozen fruit. Condiments  Avoid: Canned and packaged gravies. Worcestershire sauce. Tartar sauce. Barbecue sauce. Soy sauce. Steak sauce.  Ketchup. Onion, garlic, and table salt. Meat flavorings and tenderizers.  Choose: Fresh and dried herbs and spices. Low-sodium varieties of mustard and ketchup. Lemon juice. Tabasco sauce. Horseradish. SAMPLE 2 GRAM SODIUM MEAL PLAN Breakfast / Sodium (mg)  1 cup low-fat milk / 836 mg  2 slices whole-wheat toast / 270 mg  1 tbs heart-healthy margarine / 153 mg  1 hard-boiled egg / 139 mg  1 small orange / 0 mg Lunch / Sodium (mg)  1 cup raw carrots / 76 mg   cup hummus / 298  mg  1 cup low-fat milk / 143 mg   cup red grapes / 2 mg  1 whole-wheat pita bread / 356 mg Dinner / Sodium (mg)  1 cup whole-wheat pasta / 2 mg  1 cup low-sodium tomato sauce / 73 mg  3 oz lean ground beef / 57 mg  1 small side salad (1 cup raw spinach leaves,  cup cucumber,  cup yellow bell pepper) with 1 tsp olive oil and 1 tsp red wine vinegar / 25 mg Snack / Sodium (mg)  1 container low-fat vanilla yogurt / 107 mg  3 graham cracker squares / 127 mg Nutrient Analysis  Calories: 2033  Protein: 77 g  Carbohydrate: 282 g  Fat: 72 g  Sodium: 1971 mg Document Released: 09/17/2005 Document Revised: 12/10/2011 Document Reviewed: 12/19/2009 [employer] Patient Information 2014 [place].

## 2013-04-09 DIAGNOSIS — Z7901 Long term (current) use of anticoagulants: Secondary | ICD-10-CM | POA: Diagnosis not present

## 2013-04-09 DIAGNOSIS — Z6837 Body mass index (BMI) 37.0-37.9, adult: Secondary | ICD-10-CM | POA: Diagnosis not present

## 2013-04-09 DIAGNOSIS — I2510 Atherosclerotic heart disease of native coronary artery without angina pectoris: Secondary | ICD-10-CM | POA: Diagnosis not present

## 2013-04-09 DIAGNOSIS — I4891 Unspecified atrial fibrillation: Secondary | ICD-10-CM | POA: Diagnosis not present

## 2013-04-09 DIAGNOSIS — R32 Unspecified urinary incontinence: Secondary | ICD-10-CM | POA: Diagnosis not present

## 2013-05-12 DIAGNOSIS — L821 Other seborrheic keratosis: Secondary | ICD-10-CM | POA: Diagnosis not present

## 2013-05-12 DIAGNOSIS — L578 Other skin changes due to chronic exposure to nonionizing radiation: Secondary | ICD-10-CM | POA: Diagnosis not present

## 2013-05-12 DIAGNOSIS — Z7901 Long term (current) use of anticoagulants: Secondary | ICD-10-CM | POA: Diagnosis not present

## 2013-05-12 DIAGNOSIS — L570 Actinic keratosis: Secondary | ICD-10-CM | POA: Diagnosis not present

## 2013-06-15 DIAGNOSIS — Z7901 Long term (current) use of anticoagulants: Secondary | ICD-10-CM | POA: Diagnosis not present

## 2013-06-15 DIAGNOSIS — I10 Essential (primary) hypertension: Secondary | ICD-10-CM | POA: Diagnosis not present

## 2013-06-15 DIAGNOSIS — IMO0001 Reserved for inherently not codable concepts without codable children: Secondary | ICD-10-CM | POA: Diagnosis not present

## 2013-06-15 DIAGNOSIS — Z79899 Other long term (current) drug therapy: Secondary | ICD-10-CM | POA: Diagnosis not present

## 2013-06-15 DIAGNOSIS — E785 Hyperlipidemia, unspecified: Secondary | ICD-10-CM | POA: Diagnosis not present

## 2013-06-15 DIAGNOSIS — Z6837 Body mass index (BMI) 37.0-37.9, adult: Secondary | ICD-10-CM | POA: Diagnosis not present

## 2013-07-16 DIAGNOSIS — Z7901 Long term (current) use of anticoagulants: Secondary | ICD-10-CM | POA: Diagnosis not present

## 2013-07-16 DIAGNOSIS — Z23 Encounter for immunization: Secondary | ICD-10-CM | POA: Diagnosis not present

## 2013-08-13 ENCOUNTER — Encounter (INDEPENDENT_AMBULATORY_CARE_PROVIDER_SITE_OTHER): Payer: Self-pay

## 2013-08-13 ENCOUNTER — Ambulatory Visit (INDEPENDENT_AMBULATORY_CARE_PROVIDER_SITE_OTHER): Payer: Medicare Other

## 2013-08-13 VITALS — BP 99/59 | HR 88 | Resp 18

## 2013-08-13 DIAGNOSIS — M79609 Pain in unspecified limb: Secondary | ICD-10-CM

## 2013-08-13 DIAGNOSIS — E1142 Type 2 diabetes mellitus with diabetic polyneuropathy: Secondary | ICD-10-CM

## 2013-08-13 DIAGNOSIS — E1149 Type 2 diabetes mellitus with other diabetic neurological complication: Secondary | ICD-10-CM

## 2013-08-13 DIAGNOSIS — M722 Plantar fascial fibromatosis: Secondary | ICD-10-CM | POA: Diagnosis not present

## 2013-08-13 DIAGNOSIS — E1140 Type 2 diabetes mellitus with diabetic neuropathy, unspecified: Secondary | ICD-10-CM

## 2013-08-13 NOTE — Patient Instructions (Signed)
Plantar Fasciitis Plantar fasciitis is a common condition that causes foot pain. It is soreness (inflammation) of the band of tough fibrous tissue on the bottom of the foot that runs from the heel bone (calcaneus) to the ball of the foot. The cause of this soreness may be from excessive standing, poor fitting shoes, running on hard surfaces, being overweight, having an abnormal walk, or overuse (this is common in runners) of the painful foot or feet. It is also common in aerobic exercise dancers and ballet dancers. SYMPTOMS  Most people with plantar fasciitis complain of:  Severe pain in the morning on the bottom of their foot especially when taking the first steps out of bed. This pain recedes after a few minutes of walking.  Severe pain is experienced also during walking following a long period of inactivity.  Pain is worse when walking barefoot or up stairs DIAGNOSIS   Your caregiver will diagnose this condition by examining and feeling your foot.  Special tests such as X-rays of your foot, are usually not needed. PREVENTION   Consult a sports medicine professional before beginning a new exercise program.  Walking programs offer a good workout. With walking there is a lower chance of overuse injuries common to runners. There is less impact and less jarring of the joints.  Begin all new exercise programs slowly. If problems or pain develop, decrease the amount of time or distance until you are at a comfortable level.  Wear good shoes and replace them regularly.  Stretch your foot and the heel cords at the back of the ankle (Achilles tendon) both before and after exercise.  Run or exercise on even surfaces that are not hard. For example, asphalt is better than pavement.  Do not run barefoot on hard surfaces.  If using a treadmill, vary the incline.  Do not continue to workout if you have foot or joint problems. Seek professional help if they do not improve. HOME CARE INSTRUCTIONS     Avoid activities that cause you pain until you recover.  Use ice or cold packs on the problem or painful areas after working out.  Only take over-the-counter or prescription medicines for pain, discomfort, or fever as directed by your caregiver.  Soft shoe inserts or athletic shoes with air or gel sole cushions may be helpful.  If problems continue or become more severe, consult a sports medicine caregiver or your own health care provider. Cortisone is a potent anti-inflammatory medication that may be injected into the painful area. You can discuss this treatment with your caregiver. MAKE SURE YOU:   Understand these instructions.  Will watch your condition.  Will get help right away if you are not doing well or get worse. Document Released: 06/12/2001 Document Revised: 12/10/2011 Document Reviewed: 08/11/2008 [facility] Patient Information 2014 [place], [place].

## 2013-08-13 NOTE — Progress Notes (Signed)
  Subjective:    Patient ID: Rebekah Stewart, female    DOB: 17-Oct-1934, 78 y.o.   MRN: [id number]  HPI my left heel has been hurting for several weeks and cant hardly walk and neck and back pain had it x-ray done and had some kind of disease and my heel feels bruised    Review of Systems  Constitutional: Positive for activity change.  HENT: Negative.   Eyes: Negative.   Respiratory: Positive for chest tightness, shortness of breath and wheezing.   Cardiovascular: Positive for palpitations and leg swelling.  Gastrointestinal: Negative.   Endocrine:       Increase urination   Genitourinary: Positive for urgency.  Musculoskeletal: Positive for back pain and neck pain.       Joint pain and difficulty walking and muscle pain  Skin: Negative.   Allergic/Immunologic: Negative.   Neurological: Positive for weakness.  Hematological: Bruises/bleeds easily.  Psychiatric/Behavioral: Negative.        Objective:   Physical Exam  Vitals reviewed. Constitutional: She is oriented to person, place, and time. She appears well-developed and well-nourished.  Cardiovascular:  Pulses:      Dorsalis pedis pulses are 2+ on the right side, and 2+ on the left side.       Posterior tibial pulses are 1+ on the right side, and 1+ on the left side.  Capillary refill time 3 seconds all digits. Skin temperature warm turgor normal no edema rubor pallor noted mild varicosities noted bilateral.  Musculoskeletal:  Orthopedic biomechanical exam reveals rectus foot type noted mild semirigid digital contractures 2 through 5 bilateral. X-rays lateral and calcaneal axial views left reveals osteopenic changes well-developed inferior calcaneal spur no fractures. There is tenderness on palpation Magan plantar fascia to medial calcaneal tubercle area left foot and arch  Neurological: She is alert and oriented to person, place, and time. She has normal strength and normal reflexes. She exhibits abnormal muscle tone.   Neurologically epicritic and proprioceptive sensations are diminished on Semmes Weinstein testing to forefoot digits patient recently also complaining of weakness and abnormality gait has a difficult time even walking up and downstairs to her diabetic neuropathy.  Skin: Skin is warm and dry. No cyanosis. Nails show no clubbing.  Skin color pigment normal hair growth absent nails somewhat criptotic otherwise unremarkable  Psychiatric: She has a normal mood and affect. Her behavior is normal.          Assessment & Plan:  Assessment plantar fasciitis/heel spur syndrome left foot. Patient does have diabetic shoes with prefab Plastizote inlays with no support. At this time some additional felt arch padding applied to the orthoses fascial strapping applied to the left foot. Maintain Tylenol as needed for pain will avoid NSAIDs due to her other medications history at this time recheck in 2 weeks for followup remove the tape if any irritation to the skin. Maintain shoes at all times patient also is not wearing socks with her shoes encouraged to get some diabetic socks with reduced elastic and maintains cotton or acrylic type socks at all times. Recheck in 2 weeks for follow  Harriet Masson DPM

## 2013-08-18 DIAGNOSIS — Z7901 Long term (current) use of anticoagulants: Secondary | ICD-10-CM | POA: Diagnosis not present

## 2013-09-03 ENCOUNTER — Ambulatory Visit (INDEPENDENT_AMBULATORY_CARE_PROVIDER_SITE_OTHER): Payer: Medicare Other

## 2013-09-03 VITALS — BP 125/73 | HR 81 | Resp 16

## 2013-09-03 DIAGNOSIS — E1142 Type 2 diabetes mellitus with diabetic polyneuropathy: Secondary | ICD-10-CM

## 2013-09-03 DIAGNOSIS — M722 Plantar fascial fibromatosis: Secondary | ICD-10-CM | POA: Diagnosis not present

## 2013-09-03 DIAGNOSIS — E1140 Type 2 diabetes mellitus with diabetic neuropathy, unspecified: Secondary | ICD-10-CM

## 2013-09-03 DIAGNOSIS — R609 Edema, unspecified: Secondary | ICD-10-CM | POA: Diagnosis not present

## 2013-09-03 DIAGNOSIS — E1149 Type 2 diabetes mellitus with other diabetic neurological complication: Secondary | ICD-10-CM | POA: Diagnosis not present

## 2013-09-03 DIAGNOSIS — M79609 Pain in unspecified limb: Secondary | ICD-10-CM

## 2013-09-03 NOTE — Patient Instructions (Signed)
Diabetes and Foot Care Diabetes may cause you to have problems because of poor blood supply (circulation) to your feet and legs. This may cause the skin on your feet to become thinner, break easier, and heal more slowly. Your skin may become dry, and the skin may peel and crack. You may also have nerve damage in your legs and feet causing decreased feeling in them. You may not notice minor injuries to your feet that could lead to infections or more serious problems. Taking care of your feet is one of the most important things you can do for yourself.  HOME CARE INSTRUCTIONS  Wear shoes at all times, even in the house. Do not go barefoot. Bare feet are easily injured.  Check your feet daily for blisters, cuts, and redness. If you cannot see the bottom of your feet, use a mirror or ask someone for help.  Wash your feet with warm water (do not use hot water) and mild soap. Then pat your feet and the areas between your toes until they are completely dry. Do not soak your feet as this can dry your skin.  Apply a moisturizing lotion or petroleum jelly (that does not contain alcohol and is unscented) to the skin on your feet and to dry, brittle toenails. Do not apply lotion between your toes.  Trim your toenails straight across. Do not dig under them or around the cuticle. File the edges of your nails with an emery board or nail file.  Do not cut corns or calluses or try to remove them with medicine.  Wear clean socks or stockings every day. Make sure they are not too tight. Do not wear knee-high stockings since they may decrease blood flow to your legs.  Wear shoes that fit properly and have enough cushioning. To break in new shoes, wear them for just a few hours a day. This prevents you from injuring your feet. Always look in your shoes before you put them on to be sure there are no objects inside.  Do not cross your legs. This may decrease the blood flow to your feet.  If you find a minor scrape,  cut, or break in the skin on your feet, keep it and the skin around it clean and dry. These areas may be cleansed with mild soap and water. Do not cleanse the area with peroxide, alcohol, or iodine.  When you remove an adhesive bandage, be sure not to damage the skin around it.  If you have a wound, look at it several times a day to make sure it is healing.  Do not use heating pads or hot water bottles. They may burn your skin. If you have lost feeling in your feet or legs, you may not know it is happening until it is too late.  Make sure your health care provider performs a complete foot exam at least annually or more often if you have foot problems. Report any cuts, sores, or bruises to your health care provider immediately. SEEK MEDICAL CARE IF:   You have an injury that is not healing.  You have cuts or breaks in the skin.  You have an ingrown nail.  You notice redness on your legs or feet.  You feel burning or tingling in your legs or feet.  You have pain or cramps in your legs and feet.  Your legs or feet are numb.  Your feet always feel cold. SEEK IMMEDIATE MEDICAL CARE IF:   There is increasing redness,   swelling, or pain in or around a wound.  There is a red line that goes up your leg.  Pus is coming from a wound.  You develop a fever or as directed by your health care provider.  You notice a bad smell coming from an ulcer or wound. Document Released: 09/14/2000 Document Revised: 05/20/2013 Document Reviewed: 02/24/2013 [facility] Patient Information 2014 [place].

## 2013-09-03 NOTE — Progress Notes (Signed)
   Subjective:    Patient ID: Rebekah Stewart, female    DOB: [birth year], 78 y.o.   MRN: [id number]  HPI I LEFT THE TAPE ON FOR FOUR DAYS AND WAS BETTER AND I STAYED OFF OF IT AND IF I AM ON MY FOOT IT HAS A LOT OF PAIN WITH IT AND SOME SWELLING Patient has significant swelling +2 pitting edema both lower extremities and ankles. Patient also history of keratoses prior to using diabetic shoes secondary to plantar flexed metatarsals. Patient is significant promontory changes of the foot with arthropathy the rear foot ankle and flatfoot deformity.   Review of Systems no changes are obtained at this visit     Objective:   Physical Exam Vascular status is intact as follows DP +2/4 bilateral PT thready plus one over 4 bilateral difficult time palpating due to extensive +2 edema both lower ankles. Moderate varicosities noted as well. Neurologically epicritic and proprioceptive sensations diminished on Semmes Weinstein testing to forefoot digits and arch. DTRs not elicited. Dermatologically skin color pigment normal hair growth absent nails criptotic. Orthopedic biomechanical exam continues to have pain mid band plantar fascia mid arch bilateral left significantly worse than right the taping helped for temporary or short-term duration this is also successful because patient had reduced her activities. Patient does have diabetes has not check her blood sugar recently will be following up with Dr. [name]. Patient is scheduled already for replacement of her diabetic shoes. Current shoes are worn and the insoles provided little or no support in the longitudinal arch despite adequate padding. Patient would benefit from you dual density Plastizote inlays with additional arch support reinforcement as well as accommodative shoes to be worn and the presence of significant edema and arthritic deformities of her feet bilateral.       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 No signs of fracture noted continue symptomology plantar fasciitis is identified in the left foot and some on the right foot. Patient be scheduled for likely orthotic her diabetic shoe measurement and fitting and sometime in the near future possibly the first of the year. Option of a steroid injections given at this time. Random blood glucose today was 1 53 mg/dL cover this time patient placed back in a fascial strapping of left foot will maintain her current shoes and reappointed within the next month for orthotic casting for fitting for diabetic shoes. We'll obtain authorization from her primary physician Dr. Shara Blazing DPM

## 2013-09-17 DIAGNOSIS — I4891 Unspecified atrial fibrillation: Secondary | ICD-10-CM | POA: Diagnosis not present

## 2013-09-17 DIAGNOSIS — I2510 Atherosclerotic heart disease of native coronary artery without angina pectoris: Secondary | ICD-10-CM | POA: Diagnosis not present

## 2013-09-17 DIAGNOSIS — Z7901 Long term (current) use of anticoagulants: Secondary | ICD-10-CM | POA: Diagnosis not present

## 2013-09-17 DIAGNOSIS — Z6837 Body mass index (BMI) 37.0-37.9, adult: Secondary | ICD-10-CM | POA: Diagnosis not present

## 2013-09-17 DIAGNOSIS — I10 Essential (primary) hypertension: Secondary | ICD-10-CM | POA: Diagnosis not present

## 2013-09-17 DIAGNOSIS — Z79899 Other long term (current) drug therapy: Secondary | ICD-10-CM | POA: Diagnosis not present

## 2013-09-17 DIAGNOSIS — E785 Hyperlipidemia, unspecified: Secondary | ICD-10-CM | POA: Diagnosis not present

## 2013-09-23 DIAGNOSIS — R791 Abnormal coagulation profile: Secondary | ICD-10-CM | POA: Diagnosis not present

## 2013-09-30 DIAGNOSIS — R791 Abnormal coagulation profile: Secondary | ICD-10-CM | POA: Diagnosis not present

## 2013-10-07 DIAGNOSIS — Z7901 Long term (current) use of anticoagulants: Secondary | ICD-10-CM | POA: Diagnosis not present

## 2013-10-07 DIAGNOSIS — R791 Abnormal coagulation profile: Secondary | ICD-10-CM | POA: Diagnosis not present

## 2013-10-12 ENCOUNTER — Telehealth: Payer: Self-pay

## 2013-10-12 NOTE — Telephone Encounter (Signed)
Error  

## 2013-10-15 DIAGNOSIS — Z7901 Long term (current) use of anticoagulants: Secondary | ICD-10-CM | POA: Diagnosis not present

## 2013-11-16 DIAGNOSIS — Z7901 Long term (current) use of anticoagulants: Secondary | ICD-10-CM | POA: Diagnosis not present

## 2013-11-30 DIAGNOSIS — Z7901 Long term (current) use of anticoagulants: Secondary | ICD-10-CM | POA: Diagnosis not present

## 2013-12-11 DIAGNOSIS — E559 Vitamin D deficiency, unspecified: Secondary | ICD-10-CM | POA: Diagnosis not present

## 2013-12-11 DIAGNOSIS — Z79899 Other long term (current) drug therapy: Secondary | ICD-10-CM | POA: Diagnosis not present

## 2013-12-11 DIAGNOSIS — E785 Hyperlipidemia, unspecified: Secondary | ICD-10-CM | POA: Diagnosis not present

## 2013-12-11 DIAGNOSIS — IMO0001 Reserved for inherently not codable concepts without codable children: Secondary | ICD-10-CM | POA: Diagnosis not present

## 2013-12-16 DIAGNOSIS — Z6838 Body mass index (BMI) 38.0-38.9, adult: Secondary | ICD-10-CM | POA: Diagnosis not present

## 2013-12-16 DIAGNOSIS — E785 Hyperlipidemia, unspecified: Secondary | ICD-10-CM | POA: Diagnosis not present

## 2013-12-16 DIAGNOSIS — I10 Essential (primary) hypertension: Secondary | ICD-10-CM | POA: Diagnosis not present

## 2013-12-16 DIAGNOSIS — IMO0001 Reserved for inherently not codable concepts without codable children: Secondary | ICD-10-CM | POA: Diagnosis not present

## 2014-01-04 DIAGNOSIS — Z7901 Long term (current) use of anticoagulants: Secondary | ICD-10-CM | POA: Diagnosis not present

## 2014-01-26 ENCOUNTER — Ambulatory Visit: Payer: PRIVATE HEALTH INSURANCE | Admitting: Cardiovascular Disease

## 2014-02-02 ENCOUNTER — Ambulatory Visit: Payer: Medicare Other | Admitting: Cardiovascular Disease

## 2014-02-09 DIAGNOSIS — Z7901 Long term (current) use of anticoagulants: Secondary | ICD-10-CM | POA: Diagnosis not present

## 2014-02-23 ENCOUNTER — Ambulatory Visit (INDEPENDENT_AMBULATORY_CARE_PROVIDER_SITE_OTHER): Payer: Medicare Other | Admitting: Cardiovascular Disease

## 2014-02-23 ENCOUNTER — Encounter: Payer: Self-pay | Admitting: Cardiovascular Disease

## 2014-02-23 VITALS — BP 135/82 | HR 83 | Ht 64.0 in | Wt 214.0 lb

## 2014-02-23 DIAGNOSIS — I5032 Chronic diastolic (congestive) heart failure: Secondary | ICD-10-CM | POA: Diagnosis not present

## 2014-02-23 DIAGNOSIS — R079 Chest pain, unspecified: Secondary | ICD-10-CM | POA: Diagnosis not present

## 2014-02-23 DIAGNOSIS — I2510 Atherosclerotic heart disease of native coronary artery without angina pectoris: Secondary | ICD-10-CM | POA: Diagnosis not present

## 2014-02-23 DIAGNOSIS — I4891 Unspecified atrial fibrillation: Secondary | ICD-10-CM | POA: Diagnosis not present

## 2014-02-23 DIAGNOSIS — I10 Essential (primary) hypertension: Secondary | ICD-10-CM

## 2014-02-23 NOTE — Progress Notes (Signed)
Primary care physician: Dr. [name].   HPI  This is a 79 year old female who is here today for a followup visit regarding chronic atrial fibrillation, refractory hypertension and diastolic heart failure. She had a cardiac catheterization done in 2010 which showed mild nonobstructive coronary artery disease. She has chronic lower extremity edema which has not worsened. She has been having intermittent chest pain described as substernal aching sensation with no radiation. This happens at rest and with physical activities. A stress test was recommended last year but she did not want to have one. She has multiple chronic medical conditions including Diabetes and hyperlipidemia.  Allergies  Allergen Reactions  . Iodinated Diagnostic Agents   . Penicillins   . Tape     Pulls the skin     Current Outpatient Prescriptions on File Prior to Visit  Medication Sig Dispense Refill  . amLODipine (NORVASC) 5 MG tablet TAKE ONE TABLET BY MOUTH EVERY DAY  30 tablet  6  . aspirin 81 MG EC tablet Take 81 mg by mouth daily.        Marland Kitchen atorvastatin (LIPITOR) 40 MG tablet Take 40 mg by mouth daily.      . benazepril (LOTENSIN) 20 MG tablet Take 20 mg by mouth 2 (two) times daily.        . carvedilol (COREG) 25 MG tablet TAKE ONE TABLET BY MOUTH TWICE DAILY  60 tablet  6  . fish oil-omega-3 fatty acids 1000 MG capsule Take 2 g by mouth 2 (two) times daily.       . furosemide (LASIX) 40 MG tablet Take 20 mg by mouth daily.       Marland Kitchen glipiZIDE (GLUCOTROL XL) 5 MG 24 hr tablet Take 5 mg by mouth daily.        . isosorbide mononitrate (IMDUR) 60 MG 24 hr tablet Take 60 mg by mouth daily.        . metFORMIN (GLUCOPHAGE) 500 MG tablet Take 500 mg by mouth 2 (two) times daily with a meal.        . spironolactone (ALDACTONE) 25 MG tablet Take 1 tablet (25 mg total) by mouth daily.  30 tablet  6  . warfarin (COUMADIN) 5 MG tablet Take 5 mg by mouth as directed.        . nitroGLYCERIN (NITROSTAT) 0.4 MG SL tablet Place  1 tablet (0.4 mg total) under the tongue every 5 (five) minutes as needed for chest pain.  25 tablet  3   No current facility-administered medications on file prior to visit.     Past Medical History  Diagnosis Date  . UTI (lower urinary tract infection)   . Swelling of limb   . Varicose veins of lower extremities with other complications   . Osteoarthrosis, unspecified whether generalized or localized, unspecified site   . Lumbosacral spondylosis without myelopathy   . Hemorrhoids   . DM2 (diabetes mellitus, type 2)   . Colonic polyp   . Degeneration of lumbar or lumbosacral intervertebral disc   . Diverticulosis   . HLD (hyperlipidemia)   . Pain in thoracic spine   . Atrial fibrillation 2010  . HTN (hypertension)     diagnosed when she was in her 32s. Refractory. No RAS by [place] in 2010  . Coronary artery disease     mild non obstructive disease per cardiac cath in 2010  . Congestive heart failure, unspecified   . Chronic diastolic heart failure   . Atrial fibrillation  Past Surgical History  Procedure Laterality Date  . Tonsillectomy    . Adenoidectomy    . Cardiac catheterization  2010    At [place]. Mild nonobstructive CAD  . Eye surgery       Family History  Problem Relation Age of Onset  . Heart disease    . Cancer Sister   . Stroke Mother   . Heart attack Father      History   Social History  . Marital Status: Unknown    Spouse Name: N/A    Number of Children: N/A  . Years of Education: N/A   Occupational History  . retired    Social History Main Topics  . Smoking status: Never Smoker   . Smokeless tobacco: Never Used  . Alcohol Use: No  . Drug Use: No  . Sexual Activity: Not Currently   Other Topics Concern  . Not on file   Social History Narrative  . No narrative on file      PHYSICAL EXAM   BP 135/82  Pulse 83  Ht _0  (1.626 m)  Wt 214 lb (97.07 kg)  BMI 36.72 kg/m2 Constitutional: She is oriented to person,  place, and time. She appears well-developed and well-nourished. No distress.  HENT: No nasal discharge.  Head: Normocephalic and atraumatic.  Eyes: Pupils are equal and round. Right eye exhibits no discharge. Left eye exhibits no discharge.  Neck: Normal range of motion. Neck supple. No JVD present. No thyromegaly present.  Cardiovascular: Normal rate, irregular rhythm, normal heart sounds. Exam reveals no gallop and no friction rub. No murmur heard.  Pulmonary/Chest: Effort normal and breath sounds normal. No stridor. No respiratory distress. She has no wheezes. She has no rales. She exhibits no tenderness.  Abdominal: Soft. Bowel sounds are normal. She exhibits no distension. There is no tenderness. There is no rebound and no guarding.  Musculoskeletal: Normal range of motion. She exhibits +1 edema and no tenderness.  Neurological: She is alert and oriented to person, place, and time. Coordination normal.  Skin: Skin is warm and dry. No rash noted. She is not diaphoretic. No erythema. No pallor.  Psychiatric: She has a normal mood and affect. Her behavior is normal. Judgment and thought content normal.  Distal pulses are mildly diminished.   EKG: Atrial fibrillation with ventricular rate of 83 beats per minute. Possible old inferior infarct.  ASSESSMENT AND PLAN

## 2014-02-23 NOTE — Assessment & Plan Note (Signed)
She is doing reasonably well with rate control and anticoagulation with warfarin. Ventricular rate is reasonably controlled on current dose of carvedilol.

## 2014-02-23 NOTE — Patient Instructions (Signed)
Your physician has requested that you have a lexiscan myoview. For further information please visit [email or web address]. Please follow instruction sheet, as given.  Your physician wants you to follow-up in: 1 YEAR with Dr Fletcher Anon.  You will receive a reminder letter in the mail two months in advance. If you don't receive a letter, please call our office to schedule the follow-up appointment.  Your physician recommends that you continue on your current medications as directed. Please refer to the Current Medication list given to you today.

## 2014-02-23 NOTE — Assessment & Plan Note (Signed)
She has known history of mild nonobstructive coronary artery disease. She is having chest pain which is overall atypical but given her known history of CAD and multiple risk factors, I recommend evaluation with a pharmacologic nuclear stress test. She is not able to exercise on a treadmill.

## 2014-02-23 NOTE — Assessment & Plan Note (Signed)
She appears to be euvolemic on small dose of Lasix. Recent labs were reviewed which were unremarkable except for borderline hypokalemia with a potassium of 5.2. Potassium supplements was discontinued since then.

## 2014-02-23 NOTE — Assessment & Plan Note (Signed)
Blood pressure is well controlled on current medications. 

## 2014-03-10 ENCOUNTER — Encounter (HOSPITAL_COMMUNITY): Payer: Medicare Other

## 2014-03-12 ENCOUNTER — Encounter: Payer: Self-pay | Admitting: Cardiovascular Disease

## 2014-03-12 DIAGNOSIS — E559 Vitamin D deficiency, unspecified: Secondary | ICD-10-CM | POA: Diagnosis not present

## 2014-03-12 DIAGNOSIS — I4891 Unspecified atrial fibrillation: Secondary | ICD-10-CM | POA: Diagnosis not present

## 2014-03-12 DIAGNOSIS — E1149 Type 2 diabetes mellitus with other diabetic neurological complication: Secondary | ICD-10-CM | POA: Diagnosis not present

## 2014-03-12 DIAGNOSIS — Z7901 Long term (current) use of anticoagulants: Secondary | ICD-10-CM | POA: Diagnosis not present

## 2014-03-12 DIAGNOSIS — E785 Hyperlipidemia, unspecified: Secondary | ICD-10-CM | POA: Diagnosis not present

## 2014-03-12 DIAGNOSIS — Z79899 Other long term (current) drug therapy: Secondary | ICD-10-CM | POA: Diagnosis not present

## 2014-03-12 LAB — HEMOGLOBIN A1C: Hgb A1c MFr Bld: 7.4 % — AB (ref 4.0–6.0)

## 2014-03-12 LAB — LIPID PANEL: LDL Cholesterol: 75 mg/dL

## 2014-03-12 LAB — TSH: TSH: 2.5 u[IU]/mL (ref <=5.90)

## 2014-03-18 DIAGNOSIS — Z1331 Encounter for screening for depression: Secondary | ICD-10-CM | POA: Diagnosis not present

## 2014-03-18 DIAGNOSIS — E785 Hyperlipidemia, unspecified: Secondary | ICD-10-CM | POA: Diagnosis not present

## 2014-03-18 DIAGNOSIS — Z9181 History of falling: Secondary | ICD-10-CM | POA: Diagnosis not present

## 2014-03-18 DIAGNOSIS — H6120 Impacted cerumen, unspecified ear: Secondary | ICD-10-CM | POA: Diagnosis not present

## 2014-03-18 DIAGNOSIS — Z6838 Body mass index (BMI) 38.0-38.9, adult: Secondary | ICD-10-CM | POA: Diagnosis not present

## 2014-03-18 DIAGNOSIS — I10 Essential (primary) hypertension: Secondary | ICD-10-CM | POA: Diagnosis not present

## 2014-03-18 DIAGNOSIS — E1149 Type 2 diabetes mellitus with other diabetic neurological complication: Secondary | ICD-10-CM | POA: Diagnosis not present

## 2014-03-23 ENCOUNTER — Ambulatory Visit (HOSPITAL_COMMUNITY): Payer: Medicare Other | Attending: Cardiology | Admitting: Radiology

## 2014-03-23 VITALS — BP 112/72 | Ht 64.0 in | Wt 215.0 lb

## 2014-03-23 DIAGNOSIS — I10 Essential (primary) hypertension: Secondary | ICD-10-CM | POA: Insufficient documentation

## 2014-03-23 DIAGNOSIS — Z8249 Family history of ischemic heart disease and other diseases of the circulatory system: Secondary | ICD-10-CM | POA: Insufficient documentation

## 2014-03-23 DIAGNOSIS — R079 Chest pain, unspecified: Secondary | ICD-10-CM | POA: Insufficient documentation

## 2014-03-23 DIAGNOSIS — Z8673 Personal history of transient ischemic attack (TIA), and cerebral infarction without residual deficits: Secondary | ICD-10-CM | POA: Diagnosis not present

## 2014-03-23 DIAGNOSIS — I509 Heart failure, unspecified: Secondary | ICD-10-CM | POA: Insufficient documentation

## 2014-03-23 DIAGNOSIS — I4891 Unspecified atrial fibrillation: Secondary | ICD-10-CM | POA: Diagnosis not present

## 2014-03-23 DIAGNOSIS — E119 Type 2 diabetes mellitus without complications: Secondary | ICD-10-CM | POA: Diagnosis not present

## 2014-03-23 MED ORDER — TECHNETIUM TC 99M SESTAMIBI GENERIC - CARDIOLITE
10.0000 | Freq: Once | INTRAVENOUS | Status: AC | PRN
  Administered 2014-03-23: 10 via INTRAVENOUS

## 2014-03-23 MED ORDER — TECHNETIUM TC 99M SESTAMIBI GENERIC - CARDIOLITE
30.0000 | Freq: Once | INTRAVENOUS | Status: AC | PRN
  Administered 2014-03-23: 30 via INTRAVENOUS

## 2014-03-23 MED ORDER — REGADENOSON 0.4 MG/5ML IV SOLN
0.4000 mg | Freq: Once | INTRAVENOUS | Status: AC
  Administered 2014-03-23: 0.4 mg via INTRAVENOUS

## 2014-03-23 NOTE — Progress Notes (Signed)
[facility] [address] [place], [place] [address] [phone]    Cardiology Nuclear Med Study  Rebekah Stewart is a 79 y.o. female     MRN : [id number]     DOB: [birth year]  Procedure Date: 03/23/2014  Nuclear Med Background Indication for Stress Test:  Evaluation for Ischemia History:  N/O CAD CATH AFIB CHF '14 ECHO EF: 60% Cardiac Risk Factors: CVA, Family History - CAD, Hypertension, Lipids and NIDDM  Symptoms:  Chest Pain   Nuclear Pre-Procedure Caffeine/Decaff Intake:  None> 12 hrs NPO After: 5:30am   Lungs:  clear O2 Sat: 95% on room air. IV 0.9% NS with Angio Cath:  22g  IV Site: R Hand x 1, tolerated well IV Started by:  Irven Baltimore, RN  Chest Size (in):  44 Cup Size: C  Height: 5' 4" (1.626 m)  Weight:  215 lb (97.523 kg)  BMI:  Body mass index is 36.89 kg/(m^2). Tech Comments:  Patient took Amlodipine, Benazepril, Carvedilol, and Imdur this am, but held Lasix, Glipizide and Metformin. Irven Baltimore, RN.    Nuclear Med Study 1 or 2 day study: 1 day  Stress Test Type:  Carlton Adam  Reading MD: N/A  Order Authorizing Damaria Stofko:  Kathlyn Sacramento, MD  Resting Radionuclide: Technetium 75mSestamibi  Resting Radionuclide Dose: 11.0 mCi   Stress Radionuclide:  Technetium 97mestamibi  Stress Radionuclide Dose: 33.0 mCi           Stress Protocol Rest HR: 80 Stress HR: 93  Rest BP: 112/72 Stress BP: 110/45  Exercise Time (min): n/a METS: n/a   Predicted Max HR: 144 bpm % Max HR: 64.58 bpm Rate Pressure Product: 10416   Dose of Adenosine (mg):  n/a Dose of Lexiscan: 0.4 mg  Dose of Atropine (mg): n/a Dose of Dobutamine: n/a mcg/kg/min (at max HR)  Stress Test Technologist: SaPerrin MalteseEMT-P  Nuclear Technologist:  StCharlton AmorCNMT     Rest Procedure:  Myocardial perfusion imaging was performed at rest 45 minutes following the intravenous administration of Technetium 9972mstamibi. Rest ECG: NSR with non-specific ST-T wave  changes  Stress Procedure:  The patient received IV Lexiscan 0.4 mg over 15-seconds.  Technetium 3m59mtamibi injected at 30-seconds. This patient had sob with the Lexiscan injection. Quantitative spect images were obtained after a 45 minute delay. Stress ECG: No significant change from baseline ECG  QPS Raw Data Images:  Normal; no motion artifact; normal heart/lung ratio. Stress Images:  Normal homogeneous uptake in all areas of the myocardium. Rest Images:  Normal homogeneous uptake in all areas of the myocardium. Subtraction (SDS):  No evidence of ischemia. Transient Ischemic Dilatation (Normal <1.22):  1.12 Lung/Heart Ratio (Normal <0.45):  0.31  Quantitative Gated Spect Images QGS EDV:  n/a ml QGS ESV:  n/a ml  Impression Exercise Capacity:  Lexiscan with no exercise. BP Response:  Normal blood pressure response. Clinical Symptoms:  There is dyspnea. ECG Impression:  No significant ST segment change suggestive of ischemia. Comparison with Prior Nuclear Study: No images to compare  Overall Impression:  Normal stress nuclear study.  LV Ejection Fraction: Study not gated.  LV Wall Motion:  NL LV Function; NL Wall Motion or No EF calculated   PeteJenkins Rouge

## 2014-03-31 ENCOUNTER — Encounter: Payer: Self-pay | Admitting: Cardiovascular Disease

## 2014-03-31 NOTE — Telephone Encounter (Signed)
  This encounter was created in error - please disregard.

## 2014-03-31 NOTE — Telephone Encounter (Signed)
Patient is returning your call. Please call back.

## 2014-04-09 DIAGNOSIS — Z1231 Encounter for screening mammogram for malignant neoplasm of breast: Secondary | ICD-10-CM | POA: Diagnosis not present

## 2014-04-14 DIAGNOSIS — Z7901 Long term (current) use of anticoagulants: Secondary | ICD-10-CM | POA: Diagnosis not present

## 2014-04-23 ENCOUNTER — Emergency Department (HOSPITAL_COMMUNITY)
Admission: EM | Admit: 2014-04-23 | Discharge: 2014-04-23 | Disposition: A | Discharge status: Home / Self Care | Payer: Medicare Other | Attending: Emergency Medicine | Admitting: Emergency Medicine

## 2014-04-23 DIAGNOSIS — Z7982 Long term (current) use of aspirin: Secondary | ICD-10-CM | POA: Diagnosis not present

## 2014-04-23 DIAGNOSIS — Z860100 Personal history of colon polyps, unspecified: Secondary | ICD-10-CM | POA: Insufficient documentation

## 2014-04-23 DIAGNOSIS — I10 Essential (primary) hypertension: Secondary | ICD-10-CM | POA: Insufficient documentation

## 2014-04-23 DIAGNOSIS — R404 Transient alteration of awareness: Secondary | ICD-10-CM | POA: Insufficient documentation

## 2014-04-23 DIAGNOSIS — M159 Polyosteoarthritis, unspecified: Secondary | ICD-10-CM | POA: Diagnosis not present

## 2014-04-23 DIAGNOSIS — Z87440 Personal history of urinary (tract) infections: Secondary | ICD-10-CM | POA: Diagnosis not present

## 2014-04-23 DIAGNOSIS — I4891 Unspecified atrial fibrillation: Secondary | ICD-10-CM | POA: Diagnosis not present

## 2014-04-23 DIAGNOSIS — I2510 Atherosclerotic heart disease of native coronary artery without angina pectoris: Secondary | ICD-10-CM | POA: Diagnosis not present

## 2014-04-23 DIAGNOSIS — Z880 Allergy status to penicillin: Secondary | ICD-10-CM | POA: Insufficient documentation

## 2014-04-23 DIAGNOSIS — Z8742 Personal history of other diseases of the female genital tract: Secondary | ICD-10-CM | POA: Insufficient documentation

## 2014-04-23 DIAGNOSIS — H2510 Age-related nuclear cataract, unspecified eye: Secondary | ICD-10-CM | POA: Diagnosis not present

## 2014-04-23 DIAGNOSIS — Z8719 Personal history of other diseases of the digestive system: Secondary | ICD-10-CM | POA: Insufficient documentation

## 2014-04-23 DIAGNOSIS — Z79899 Other long term (current) drug therapy: Secondary | ICD-10-CM | POA: Diagnosis not present

## 2014-04-23 DIAGNOSIS — E119 Type 2 diabetes mellitus without complications: Secondary | ICD-10-CM | POA: Diagnosis not present

## 2014-04-23 DIAGNOSIS — I5032 Chronic diastolic (congestive) heart failure: Secondary | ICD-10-CM | POA: Diagnosis not present

## 2014-04-23 DIAGNOSIS — Z9889 Other specified postprocedural states: Secondary | ICD-10-CM | POA: Diagnosis not present

## 2014-04-23 DIAGNOSIS — E785 Hyperlipidemia, unspecified: Secondary | ICD-10-CM | POA: Diagnosis not present

## 2014-04-23 DIAGNOSIS — Z7901 Long term (current) use of anticoagulants: Secondary | ICD-10-CM | POA: Insufficient documentation

## 2014-04-23 DIAGNOSIS — H35379 Puckering of macula, unspecified eye: Secondary | ICD-10-CM | POA: Diagnosis not present

## 2014-04-23 DIAGNOSIS — Z961 Presence of intraocular lens: Secondary | ICD-10-CM | POA: Diagnosis not present

## 2014-04-23 DIAGNOSIS — R55 Syncope and collapse: Secondary | ICD-10-CM

## 2014-04-23 LAB — BASIC METABOLIC PANEL
Anion gap: 16 — ABNORMAL HIGH (ref 5–15)
BUN: 25 mg/dL — ABNORMAL HIGH (ref 6–23)
CHLORIDE: 99 meq/L (ref 96–112)
CO2: 25 meq/L (ref 19–32)
Calcium: 9.4 mg/dL (ref 8.4–10.5)
Creatinine, Ser: 0.95 mg/dL (ref 0.50–1.10)
GFR calc Af Amer: 66 mL/min — ABNORMAL LOW (ref >90)
GFR, EST NON AFRICAN AMERICAN: 57 mL/min — AB (ref >90)
GLUCOSE: 194 mg/dL — AB (ref 70–99)
POTASSIUM: 5.3 meq/L (ref 3.7–5.3)
SODIUM: 140 meq/L (ref 137–147)

## 2014-04-23 LAB — CBC
HCT: 46.9 % — ABNORMAL HIGH (ref 36.0–46.0)
Hemoglobin: 15.2 g/dL — ABNORMAL HIGH (ref 12.0–15.0)
MCH: 31.5 pg (ref 26.0–34.0)
MCHC: 32.4 g/dL (ref 30.0–36.0)
MCV: 97.3 fL (ref 78.0–100.0)
Platelets: 280 10*3/uL (ref 150–400)
RBC: 4.82 MIL/uL (ref 3.87–5.11)
RDW: 14.3 % (ref 11.5–15.5)
WBC: 8.8 10*3/uL (ref 4.0–10.5)

## 2014-04-23 LAB — CBG MONITORING, ED
GLUCOSE-CAPILLARY: 190 mg/dL — AB (ref 70–99)
GLUCOSE-CAPILLARY: 155 mg/dL — AB (ref 70–99)

## 2014-04-23 LAB — PROTIME-INR
INR: 2.25 — AB (ref 0.00–1.49)
Prothrombin Time: 24.9 seconds — ABNORMAL HIGH (ref 11.6–15.2)

## 2014-04-23 NOTE — Discharge Instructions (Signed)
Rest. Drink plenty of fluids. Follow up with your doctor/cardiologist in the next few days - discuss possible outpatient/holter monitor. Return to ER right away if worse, symptoms recur, fainting spell, feeling weak/dizzy, fevers, chest pain, trouble breathing, palpitations, other concern.   Syncope Syncope is a medical term for fainting or passing out. This means you lose consciousness and drop to the ground. People are generally unconscious for less than 5 minutes. You may have some muscle twitches for up to 15 seconds before waking up and returning to normal. Syncope occurs more often in older adults, but it can happen to anyone. While most causes of syncope are not dangerous, syncope can be a sign of a serious medical problem. It is important to seek medical care.  CAUSES  Syncope is caused by a sudden drop in blood flow to the brain. The specific cause is often not determined. Factors that can bring on syncope include:  Taking medicines that lower blood pressure.  Sudden changes in posture, such as standing up quickly.  Taking more medicine than prescribed.  Standing in one place for too long.  Seizure disorders.  Dehydration and excessive exposure to heat.  Low blood sugar (hypoglycemia).  Straining to have a bowel movement.  Heart disease, irregular heartbeat, or other circulatory problems.  Fear, emotional distress, seeing blood, or severe pain. SYMPTOMS  Right before fainting, you may:  Feel dizzy or light-headed.  Feel nauseous.  See all white or all black in your field of vision.  Have cold, clammy skin. DIAGNOSIS  Your health care provider will ask about your symptoms, perform a physical exam, and perform an electrocardiogram (ECG) to record the electrical activity of your heart. Your health care provider may also perform other heart or blood tests to determine the cause of your syncope which may include:  Transthoracic echocardiogram (TTE). During  echocardiography, sound waves are used to evaluate how blood flows through your heart.  Transesophageal echocardiogram (TEE).  Cardiac monitoring. This allows your health care provider to monitor your heart rate and rhythm in real time.  Holter monitor. This is a portable device that records your heartbeat and can help diagnose heart arrhythmias. It allows your health care provider to track your heart activity for several days, if needed.  Stress tests by exercise or by giving medicine that makes the heart beat faster. TREATMENT  In most cases, no treatment is needed. Depending on the cause of your syncope, your health care provider may recommend changing or stopping some of your medicines. HOME CARE INSTRUCTIONS  Have someone stay with you until you feel stable.  Do not drive, use machinery, or play sports until your health care provider says it is okay.  Keep all follow-up appointments as directed by your health care provider.  Lie down right away if you start feeling like you might faint. Breathe deeply and steadily. Wait until all the symptoms have passed.  Drink enough fluids to keep your urine clear or pale yellow.  If you are taking blood pressure or heart medicine, get up slowly and take several minutes to sit and then stand. This can reduce dizziness. SEEK IMMEDIATE MEDICAL CARE IF:   You have a severe headache.  You have unusual pain in the chest, abdomen, or back.  You are bleeding from your mouth or rectum, or you have black or tarry stool.  You have an irregular or very fast heartbeat.  You have pain with breathing.  You have repeated fainting or seizure-like jerking during  an episode.  You faint when sitting or lying down.  You have confusion.  You have trouble walking.  You have severe weakness.  You have vision problems. If you fainted, call your local emergency services (911 in [place] Do not drive yourself to the hospital.  MAKE SURE YOU:  Understand  these instructions.  Will watch your condition.  Will get help right away if you are not doing well or get worse. Document Released: 09/17/2005 Document Revised: 09/22/2013 Document Reviewed: 11/16/2011 [facility] Patient Information 2015 [place], [place]. This information is not intended to replace advice given to you by your health care provider. Make sure you discuss any questions you have with your health care provider.

## 2014-04-23 NOTE — ED Provider Notes (Signed)
CSN: [id number]     Arrival date & time 04/23/14  1645 History   First MD Initiated Contact with Patient 04/23/14 1846     Chief Complaint  Patient presents with  . Loss of Consciousness     (Consider location/radiation/quality/duration/timing/severity/associated sxs/prior Treatment) Patient is a 79 y.o. female presenting with syncope. The history is provided by the patient.  Loss of Consciousness Associated symptoms: no chest pain, no confusion, no fever, no headaches, no seizures, no shortness of breath, no vomiting and no weakness   pt w hx htn, afib, was at eye doctors this afternoon when had syncopal event.  Pt indicates she had taken her diabetic medication a few hours earlier, was feeling nauseated as if she had to eat, felt faint, brief syncopal event.  Was in chair, did not fall to ground, no trauma. Denies any associated cp or discomfort. No sob. No palpitations or sense of unusual fast or slow heartbeat. No hx syncope or falls. No recent change in meds, compliant w normal meds. Denies any recent blood loss, no rectal bleeding or melena. No dysuria or gu c/o. Denies abd pain. No vomiting or diarrhea. No headache. No cough or uri c/o. States feels at baseline currently.    Past Medical History  Diagnosis Date  . UTI (lower urinary tract infection)   . Swelling of limb   . Varicose veins of lower extremities with other complications   . Osteoarthrosis, unspecified whether generalized or localized, unspecified site   . Lumbosacral spondylosis without myelopathy   . Hemorrhoids   . DM2 (diabetes mellitus, type 2)   . Colonic polyp   . Degeneration of lumbar or lumbosacral intervertebral disc   . Diverticulosis   . HLD (hyperlipidemia)   . Pain in thoracic spine   . Atrial fibrillation 2010  . HTN (hypertension)     diagnosed when she was in her 78s. Refractory. No RAS by [place] in 2010  . Coronary artery disease     mild non obstructive disease per cardiac cath in 2010  .  Congestive heart failure, unspecified   . Chronic diastolic heart failure   . Atrial fibrillation    Past Surgical History  Procedure Laterality Date  . Tonsillectomy    . Adenoidectomy    . Cardiac catheterization  2010    At [place]. Mild nonobstructive CAD  . Eye surgery     Family History  Problem Relation Age of Onset  . Heart disease    . Cancer Sister   . Stroke Mother   . Heart attack Father    History  Substance Use Topics  . Smoking status: Never Smoker   . Smokeless tobacco: Never Used  . Alcohol Use: No   OB History   Grav Para Term Preterm Abortions TAB SAB Ect Mult Living                 Review of Systems  Constitutional: Negative for fever and chills.  HENT: Negative for sore throat.   Eyes: Negative for redness and visual disturbance.  Respiratory: Negative for cough and shortness of breath.   Cardiovascular: Positive for syncope. Negative for chest pain.  Gastrointestinal: Negative for vomiting, abdominal pain, diarrhea and blood in stool.  Genitourinary: Negative for flank pain.  Musculoskeletal: Negative for back pain and neck pain.  Skin: Negative for rash.  Neurological: Negative for seizures, weakness, numbness and headaches.  Hematological: Does not bruise/bleed easily.  Psychiatric/Behavioral: Negative for confusion.  Allergies  Iodinated diagnostic agents; Penicillins; and Tape  Home Medications   Prior to Admission medications   Medication Sig Start Date End Date Taking? Authorizing Provider  amLODipine (NORVASC) 5 MG tablet TAKE ONE TABLET BY MOUTH EVERY DAY 12/29/11   Wellington Hampshire, MD  aspirin 81 MG EC tablet Take 81 mg by mouth daily.      Historical Provider, MD  atorvastatin (LIPITOR) 40 MG tablet Take 40 mg by mouth daily.    Historical Provider, MD  benazepril (LOTENSIN) 20 MG tablet Take 20 mg by mouth 2 (two) times daily.      Historical Provider, MD  carvedilol (COREG) 25 MG tablet TAKE ONE TABLET BY MOUTH  TWICE DAILY 12/29/11   Wellington Hampshire, MD  fish oil-omega-3 fatty acids 1000 MG capsule Take 2 g by mouth 2 (two) times daily.     Historical Provider, MD  furosemide (LASIX) 40 MG tablet Take 20 mg by mouth daily.     Historical Provider, MD  glipiZIDE (GLUCOTROL XL) 5 MG 24 hr tablet Take 5 mg by mouth daily.      Historical Provider, MD  isosorbide mononitrate (IMDUR) 60 MG 24 hr tablet Take 60 mg by mouth daily.      Historical Provider, MD  metFORMIN (GLUCOPHAGE) 500 MG tablet Take 500 mg by mouth 2 (two) times daily with a meal.      Historical Provider, MD  nitroGLYCERIN (NITROSTAT) 0.4 MG SL tablet Place 1 tablet (0.4 mg total) under the tongue every 5 (five) minutes as needed for chest pain. 03/25/13   Burtis Junes, NP  spironolactone (ALDACTONE) 25 MG tablet Take 1 tablet (25 mg total) by mouth daily. 09/15/12 02/23/14  Wellington Hampshire, MD  warfarin (COUMADIN) 5 MG tablet Take 5 mg by mouth as directed.      Historical Provider, MD   BP 130/87  Pulse 76  Temp(Src) 98 F (36.7 C) (Oral)  Resp 24  Ht _0  (1.626 m)  Wt 214 lb (97.07 kg)  BMI 36.72 kg/m2  SpO2 98% Physical Exam  Nursing note and vitals reviewed. Constitutional: She is oriented to person, place, and time. She appears well-developed and well-nourished. No distress.  HENT:  Mouth/Throat: Oropharynx is clear and moist.  Eyes: Conjunctivae are normal. Pupils are equal, round, and reactive to light. No scleral icterus.  Neck: Neck supple. No tracheal deviation present.  Cardiovascular: Normal rate, normal heart sounds and intact distal pulses.  Exam reveals no gallop and no friction rub.   No murmur heard. Pulmonary/Chest: Effort normal and breath sounds normal. No respiratory distress.  Abdominal: Soft. Normal appearance and bowel sounds are normal. She exhibits no distension and no mass. There is no tenderness. There is no rebound and no guarding.  No puls mass  Genitourinary:  No cva tenderness. Light  brown/yellowish stool, heme neg.   Musculoskeletal: She exhibits no edema and no tenderness.  Neurological: She is alert and oriented to person, place, and time.  Motor intact bil. stre 5/5. sens intact.   Skin: Skin is warm and dry. No rash noted. She is not diaphoretic.  Psychiatric: She has a normal mood and affect.    ED Course  Procedures (including critical care time) Labs Review   Results for orders placed during the hospital encounter of 04/23/14  CBC      Result Value Ref Range   WBC 8.8  4.0 - 10.5 K/uL   RBC 4.82  3.87 - 5.11 MIL/uL  Hemoglobin 15.2 (*) 12.0 - 15.0 g/dL   HCT 46.9 (*) 36.0 - 46.0 %   MCV 97.3  78.0 - 100.0 fL   MCH 31.5  26.0 - 34.0 pg   MCHC 32.4  30.0 - 36.0 g/dL   RDW 14.3  11.5 - 15.5 %   Platelets 280  150 - 400 K/uL  BASIC METABOLIC PANEL      Result Value Ref Range   Sodium 140  137 - 147 mEq/L   Potassium 5.3  3.7 - 5.3 mEq/L   Chloride 99  96 - 112 mEq/L   CO2 25  19 - 32 mEq/L   Glucose, Bld 194 (*) 70 - 99 mg/dL   BUN 25 (*) 6 - 23 mg/dL   Creatinine, Ser 0.95  0.50 - 1.10 mg/dL   Calcium 9.4  8.4 - 10.5 mg/dL   GFR calc non Af Amer 57 (*) >90 mL/min   GFR calc Af Amer 66 (*) >90 mL/min   Anion gap 16 (*) 5 - 15  PROTIME-INR      Result Value Ref Range   Prothrombin Time 24.9 (*) 11.6 - 15.2 seconds   INR 2.25 (*) 0.00 - 1.49  CBG MONITORING, ED      Result Value Ref Range   Glucose-Capillary 190 (*) 70 - 99 mg/dL  CBG MONITORING, ED      Result Value Ref Range   Glucose-Capillary 155 (*) 70 - 99 mg/dL      EKG Interpretation   Date/Time:  Friday April 23 2014 17:08:58 EDT Ventricular Rate:  89 PR Interval:    QRS Duration: 74 QT Interval:  364 QTC Calculation: 442 R Axis:   0 Text Interpretation:  Atrial fibrillation Low voltage QRS Nonspecific T  wave abnormality No previous tracing Confirmed by Ashok Cordia  MD, Lennette Bihari  ([id number]) on 04/23/2014 7:07:58 PM      MDM  Iv ns. Labs. Monitor.   Reviewed nursing notes and  prior charts for additional history.   Recheck remains in afib, hx same. Rate controlled. inr 2.25. Rectal heme neg.  Pt remains asymptomatic, pt repetitively requests d/c, not willing to stay in ED or hospital for continued monitoring or observation.  Pt ambulates in ED, no faintness or dizziness.  Pt remains asymptomatic, and requests d/c to home now.  Will process d/c.      Mirna Mires, MD 04/23/14 2017

## 2014-04-23 NOTE — ED Notes (Signed)
Pt states that she was told that she passed out this afternoon while at the eye dr. Abbott Stewart states that she is diabetic and had taken her oral medications this morning. Pt states that they gave her juice and cracker and was told to come here for further evaluation

## 2014-04-23 NOTE — ED Notes (Signed)
Pt pupils are equal but dilated from eye dr appt

## 2014-04-26 DIAGNOSIS — Z6837 Body mass index (BMI) 37.0-37.9, adult: Secondary | ICD-10-CM | POA: Diagnosis not present

## 2014-04-26 DIAGNOSIS — R55 Syncope and collapse: Secondary | ICD-10-CM | POA: Diagnosis not present

## 2014-04-26 DIAGNOSIS — E1169 Type 2 diabetes mellitus with other specified complication: Secondary | ICD-10-CM | POA: Diagnosis not present

## 2014-05-17 DIAGNOSIS — Z7901 Long term (current) use of anticoagulants: Secondary | ICD-10-CM | POA: Diagnosis not present

## 2014-05-19 ENCOUNTER — Ambulatory Visit: Payer: Medicare Other

## 2014-05-19 ENCOUNTER — Ambulatory Visit (INDEPENDENT_AMBULATORY_CARE_PROVIDER_SITE_OTHER): Payer: Medicare Other

## 2014-05-19 VITALS — BP 132/78 | HR 73 | Resp 18

## 2014-05-19 DIAGNOSIS — E1142 Type 2 diabetes mellitus with diabetic polyneuropathy: Secondary | ICD-10-CM

## 2014-05-19 DIAGNOSIS — M79609 Pain in unspecified limb: Secondary | ICD-10-CM

## 2014-05-19 DIAGNOSIS — M722 Plantar fascial fibromatosis: Secondary | ICD-10-CM

## 2014-05-19 DIAGNOSIS — E1149 Type 2 diabetes mellitus with other diabetic neurological complication: Secondary | ICD-10-CM

## 2014-05-19 DIAGNOSIS — Z6837 Body mass index (BMI) 37.0-37.9, adult: Secondary | ICD-10-CM | POA: Diagnosis not present

## 2014-05-19 DIAGNOSIS — B351 Tinea unguium: Secondary | ICD-10-CM

## 2014-05-19 DIAGNOSIS — E1140 Type 2 diabetes mellitus with diabetic neuropathy, unspecified: Secondary | ICD-10-CM

## 2014-05-19 DIAGNOSIS — I10 Essential (primary) hypertension: Secondary | ICD-10-CM | POA: Diagnosis not present

## 2014-05-19 DIAGNOSIS — M79676 Pain in unspecified toe(s): Secondary | ICD-10-CM

## 2014-05-19 NOTE — Patient Instructions (Signed)
Diabetes and Foot Care Diabetes may cause you to have problems because of poor blood supply (circulation) to your feet and legs. This may cause the skin on your feet to become thinner, break easier, and heal more slowly. Your skin may become dry, and the skin may peel and crack. You may also have nerve damage in your legs and feet causing decreased feeling in them. You may not notice minor injuries to your feet that could lead to infections or more serious problems. Taking care of your feet is one of the most important things you can do for yourself.  HOME CARE INSTRUCTIONS  Wear shoes at all times, even in the house. Do not go barefoot. Bare feet are easily injured.  Check your feet daily for blisters, cuts, and redness. If you cannot see the bottom of your feet, use a mirror or ask someone for help.  Wash your feet with warm water (do not use hot water) and mild soap. Then pat your feet and the areas between your toes until they are completely dry. Do not soak your feet as this can dry your skin.  Apply a moisturizing lotion or petroleum jelly (that does not contain alcohol and is unscented) to the skin on your feet and to dry, brittle toenails. Do not apply lotion between your toes.  Trim your toenails straight across. Do not dig under them or around the cuticle. File the edges of your nails with an emery board or nail file.  Do not cut corns or calluses or try to remove them with medicine.  Wear clean socks or stockings every day. Make sure they are not too tight. Do not wear knee-high stockings since they may decrease blood flow to your legs.  Wear shoes that fit properly and have enough cushioning. To break in new shoes, wear them for just a few hours a day. This prevents you from injuring your feet. Always look in your shoes before you put them on to be sure there are no objects inside.  Do not cross your legs. This may decrease the blood flow to your feet.  If you find a minor scrape,  cut, or break in the skin on your feet, keep it and the skin around it clean and dry. These areas may be cleansed with mild soap and water. Do not cleanse the area with peroxide, alcohol, or iodine.  When you remove an adhesive bandage, be sure not to damage the skin around it.  If you have a wound, look at it several times a day to make sure it is healing.  Do not use heating pads or hot water bottles. They may burn your skin. If you have lost feeling in your feet or legs, you may not know it is happening until it is too late.  Make sure your health care provider performs a complete foot exam at least annually or more often if you have foot problems. Report any cuts, sores, or bruises to your health care provider immediately. SEEK MEDICAL CARE IF:   You have an injury that is not healing.  You have cuts or breaks in the skin.  You have an ingrown nail.  You notice redness on your legs or feet.  You feel burning or tingling in your legs or feet.  You have pain or cramps in your legs and feet.  Your legs or feet are numb.  Your feet always feel cold. SEEK IMMEDIATE MEDICAL CARE IF:   There is increasing redness,   swelling, or pain in or around a wound.  There is a red line that goes up your leg.  Pus is coming from a wound.  You develop a fever or as directed by your health care provider.  You notice a bad smell coming from an ulcer or wound. Document Released: 09/14/2000 Document Revised: 05/20/2013 Document Reviewed: 02/24/2013 Exit[employer] Patient Information 2015 [employer]. This information is not intended to replace advice given to you by your health care provider. Make sure you discuss any questions you have with your health care provider.  

## 2014-05-19 NOTE — Progress Notes (Signed)
   Subjective:    Patient ID: Rebekah Stewart, female    DOB: Nov 19, 1934, 79 y.o.   MRN: [id number]  HPI I AM HERE TO GET MY DIABETIC SHOES AND I NEED MY TOENAILS TRIMMED     Review of Systems no new findings or systemic changes noted patient does have history of diabetes with peripheral neuropathy. Mild venous insufficiency and edema noted with +2 pitting in both legs.     Objective:   Physical Exam 79 year old white female well-developed well-nourished oriented x3 presents at this time for followup has been nearly a month since she was last seen requesting diabetic foot and nail care and picking up diabetic accident shoes which were previously ordered. Lower extremity objective findings as follows vascular status reveals pedal pulses DP +2/4 bilateral PT thready nonpalpable bilateral plus one +2 edema both lower extremities mild varicosities noted neurologically epicritic and proprioceptive sensations diminished on Semmes Weinstein testing to forefoot digits and plantar arch bilateral. There is normal plantar response DTRs not elicited dermatologically skin color pigment normal hair growth absent is multiple areas of erythema and redness patient not been wearing socks has erythema the distal hallux plantar and distal tuft with keratoses also healed petechial lesions of second and fourth digits right foot due to possible shoe irritation and she's not wearing socks nails thick brittle crumbly criptotic and friable 1 through 5 bilateral most significant bilateral hallux. Yellowed thick brittle and tender both on palpation and with enclosed shoe wear. Orthopedic biomechanical exam reveals semirigid digital contractures hammertoe deformities 2 through 5 hallux is relatively rectus no other osseous abnormalities or deformities noted mild promontory changes on both feet. No active open wounds or ulcers are identified.       Assessment & Plan:  Assessment diabetes with history peripheral neuropathy patient  may have mild valgus deformity from 3 changes of the foot with history of plantar fascial pain and inferior heel and arch pain due to from 3 changes. Has been diabetic shoes in the past which are worn need replacing .  New diabetic shoes are dispensed a pair shoes and 3 pairs of dual density Plastizote inlays are dispensed a fit and contour well to the foot patient's toes are somewhat inflamed and sore to touch and she is advised to wear socks with her shoes and sandals and slippers at all times. Reappointed 3 months for palliative care thick dystrophic brittle crumbly friable mycotic nails 1 through 5 bilateral debridement at this time and the presence of pain in symptomology as well as her diabetes and complications recheck in 3 months as indicated  Harriet Masson DPM

## 2014-05-20 ENCOUNTER — Ambulatory Visit (INDEPENDENT_AMBULATORY_CARE_PROVIDER_SITE_OTHER): Payer: Medicare Other | Admitting: Endocrinology

## 2014-05-20 ENCOUNTER — Telehealth: Payer: Self-pay | Admitting: Endocrinology

## 2014-05-20 ENCOUNTER — Encounter: Payer: Self-pay | Admitting: Endocrinology

## 2014-05-20 VITALS — BP 129/82 | HR 83 | Temp 98.1°F | Resp 16 | Ht 64.0 in | Wt 215.6 lb

## 2014-05-20 DIAGNOSIS — I10 Essential (primary) hypertension: Secondary | ICD-10-CM

## 2014-05-20 DIAGNOSIS — E669 Obesity, unspecified: Secondary | ICD-10-CM | POA: Diagnosis not present

## 2014-05-20 DIAGNOSIS — E7800 Pure hypercholesterolemia, unspecified: Secondary | ICD-10-CM

## 2014-05-20 DIAGNOSIS — IMO0001 Reserved for inherently not codable concepts without codable children: Secondary | ICD-10-CM

## 2014-05-20 DIAGNOSIS — R55 Syncope and collapse: Secondary | ICD-10-CM | POA: Diagnosis not present

## 2014-05-20 DIAGNOSIS — E1169 Type 2 diabetes mellitus with other specified complication: Secondary | ICD-10-CM | POA: Diagnosis not present

## 2014-05-20 DIAGNOSIS — E1165 Type 2 diabetes mellitus with hyperglycemia: Principal | ICD-10-CM

## 2014-05-20 LAB — GLUCOSE, POCT (MANUAL RESULT ENTRY): POC Glucose: 133 mg/dl — AB (ref 70–99)

## 2014-05-20 NOTE — Telephone Encounter (Signed)
Patient is down stairs in the lobby, she is suppose to see Vaughan Basta and don't know what about. Please to advise

## 2014-05-20 NOTE — Progress Notes (Signed)
Patient ID: Rebekah Stewart, female   DOB: Oct 14, 1934, 79 y.o.   MRN: [id number]           Reason for Appointment: Consultation for Type 2 Diabetes  Referring physician: Nelda Bucks  History of Present Illness:          Diagnosis: Type 2 diabetes mellitus, date of diagnosis: 2005       Past history: Rebekah Stewart had been on metformin for several years for her management been taking 500 mg twice a day Also at some point Rebekah Stewart was given glipizide 5 mg daily with reportedly good control However Rebekah Stewart usually would not be checking her blood sugar at home  Recent history:  In 2015 her A1c has been around 7%, was 7.1 in 3/15 and 7.4 in 6/15 Rebekah Stewart had been fairly stable with taking metformin and glipizide although not checking blood sugars at home In June when Rebekah Stewart was having an eye exam Rebekah Stewart certainly passed out. Rebekah Stewart thinks that day Rebekah Stewart had her normal meals and this was in the afternoon. Rebekah Stewart had no warning symptoms and reportedly was blacked out for about 30 seconds. Rebekah Stewart was a little confused when Rebekah Stewart woke up but subsequently was quite lucid. Did not have any associated sweating and was treated with diet Coke in the office. Glucose in the emergency room was 190 Subsequently her glipizide was stopped and Rebekah Stewart has been on metformin twice a day Rebekah Stewart also started checking her blood sugars at home which Rebekah Stewart had not done before Also because of her relatively high A1c Rebekah Stewart started watching her diet more carefully and cutting back on high fat foods, carbohydrates and sweets Rebekah Stewart is checking her blood sugars primarily in the mornings and these are looking fairly good Has done about 4 readings midday although and these were relatively high only twice Rebekah Stewart is now referred for further management       Oral hypoglycemic drugs the patient is taking are:   metformin     Side effects from medications have been: None Compliance with the medical regimen: Improved recently Hypoglycemia: not documented  Glucose monitoring:  done  one time a day         Glucometer:  Accu-Chek       Blood Glucose readings by time of day and averages from meter download:  Fasting range 100-136, midday 134-190 with only 4 readings  Glycemic control:   Lab Results  Component Value Date   HGBA1C 7.4* 03/12/2014   Lab Results  Component Value Date   LDLCALC 75 03/12/2014   CREATININE 0.95 04/23/2014   Microalbumin/creatinine ratio normal in 12/14  Retinal exam: Most recent: 6/15, reportedly normal.   Self-care: The diet that the patient has been following is: tries to limit portions.      Meals: 3 meals per day.          Exercise:  minimal         Dietician visit: Most recent: At diagnosis.               Weight history:   Wt Readings from Last 3 Encounters:  05/20/14 215 lb 9.6 oz (97.796 kg)  04/23/14 214 lb (97.07 kg)  03/23/14 215 lb (97.523 kg)       Medication List       This list is accurate as of: 05/20/14  2:16 PM.  Always use your most recent med list.               amLODipine 5  MG tablet  Commonly known as:  NORVASC  Take 5 mg by mouth daily.     aspirin 81 MG EC tablet  Take 81 mg by mouth daily.     atorvastatin 40 MG tablet  Commonly known as:  LIPITOR  Take 40 mg by mouth daily.     benazepril 20 MG tablet  Commonly known as:  LOTENSIN  Take 20 mg by mouth daily.     carvedilol 25 MG tablet  Commonly known as:  COREG  Take 25 mg by mouth 2 (two) times daily with a meal.     fish oil-omega-3 fatty acids 1000 MG capsule  Take 2 g by mouth 2 (two) times daily.     furosemide 40 MG tablet  Commonly known as:  LASIX  Take 20 mg by mouth daily.     glipiZIDE 5 MG 24 hr tablet  Commonly known as:  GLUCOTROL XL  Take 5 mg by mouth daily.     isosorbide mononitrate 60 MG 24 hr tablet  Commonly known as:  IMDUR  Take 60 mg by mouth daily.     metFORMIN 500 MG tablet  Commonly known as:  GLUCOPHAGE  Take 500 mg by mouth 2 (two) times daily with a meal.     nitroGLYCERIN 0.4 MG SL  tablet  Commonly known as:  NITROSTAT  Place 1 tablet (0.4 mg total) under the tongue every 5 (five) minutes as needed for chest pain.     spironolactone 25 MG tablet  Commonly known as:  ALDACTONE  Take 1 tablet (25 mg total) by mouth daily.     warfarin 5 MG tablet  Commonly known as:  COUMADIN  Take 5 mg by mouth daily.        Allergies:  Allergies  Allergen Reactions  . Bee Venom Shortness Of Breath and Swelling  . Iodinated Diagnostic Agents Itching  . Penicillins Swelling  . Tape     Pulls the skin    Past Medical History  Diagnosis Date  . UTI (lower urinary tract infection)   . Swelling of limb   . Varicose veins of lower extremities with other complications   . Osteoarthrosis, unspecified whether generalized or localized, unspecified site   . Lumbosacral spondylosis without myelopathy   . Hemorrhoids   . DM2 (diabetes mellitus, type 2)   . Colonic polyp   . Degeneration of lumbar or lumbosacral intervertebral disc   . Diverticulosis   . HLD (hyperlipidemia)   . Pain in thoracic spine   . Atrial fibrillation 2010  . HTN (hypertension)     diagnosed when Rebekah Stewart was in her 60s. Refractory. No RAS by [place] in 2010  . Coronary artery disease     mild non obstructive disease per cardiac cath in 2010  . Congestive heart failure, unspecified   . Chronic diastolic heart failure   . Atrial fibrillation     Past Surgical History  Procedure Laterality Date  . Tonsillectomy    . Adenoidectomy    . Cardiac catheterization  2010    At [facility]. Mild nonobstructive CAD  . Eye surgery      Family History  Problem Relation Age of Onset  . Heart disease    . Cancer Sister   . Stroke Mother   . Heart attack Father     Social History:  reports that Rebekah Stewart has never smoked. Rebekah Stewart has never used smokeless tobacco. Rebekah Stewart reports that Rebekah Stewart does not drink alcohol or  use illicit drugs.    Review of Systems       Lipids: Rebekah Stewart has been on Lipitor 40 mg for hyperlipidemia,  followed by PCP, LDL was 75 in 6/15 with HDL 52       No results found for this basename: CHOL,  HDL,  LDLCALC,  LDLDIRECT,  TRIG,  CHOLHDL                 Skin: No infections, has rash in the folds of her abdomen and under her breasts     Thyroid:  No  unusual fatigue. No history of thyroid disease, TSH normal in 6/15     The blood pressure has been treated with multiple medications including benazepril 20 mg twice a day previously In June Rebekah Stewart appeared to have relatively low blood pressure readings when checking at home and her benazepril has been reduced to 20 mg once a day Recent BP at home: Systolic 760-667  Syncope in June as discussed in history of present illness. Has not had any recurrence of syncope but will have transient sensation of nearly passing out associated with some sweating and nausea     No swelling of feet.     No shortness of breath or chest tightness  on exertion.     Rebekah Stewart has history of atrial fibrillation in 2010 and congestive heart failure in 2011     Bowel habits: Occasional diarrhea related to dairy products. Occasionally may have nausea associated with her dizziness and sweating episodes      Hot flushes present occasionally       No frequency of urination or nocturia        Knee joint  pains present, also recent  Heel pain treated by a podiatrist         No history of Numbness, tingling in feet; may have some burning sensation at times   LABS:  Office Visit on 05/20/2014  Component Date Value Ref Range Status  . LDL Cholesterol 03/12/2014 75   Final  . Hemoglobin A1C 03/12/2014 7.4* 4.0 - 6.0 % Final  . TSH 03/12/2014 2.50  .41 - 5.90 uIU/mL Final    Physical Examination:  BP 129/82  Pulse 83  Temp(Src) 98.1 F (36.7 C)  Resp 16  Ht 5' 4" (1.626 m)  Wt 215 lb 9.6 oz (97.796 kg)  BMI 36.99 kg/m2  SpO2 93%  GENERAL:         Patient has generalized obesity.  Rebekah Stewart is pleasant, alert and oriented HEENT:         Eye exam shows normal  external appearance. Fundus exam shows no retinopathy. Oral exam shows normal mucosa .  NECK:         General:  Neck exam shows no lymphadenopathy. Carotids are normal to palpation and no bruit heard.  Thyroid is not enlarged and no nodules felt.   LUNGS:         Chest is symmetrical. Lungs are clear to auscultation.Marland Kitchen   HEART:         Heart sounds:  irregular rhythm present. S1 and S2 are normal. No murmurs or clicks heard., no S3 or S4.   ABDOMEN:   There is no distention present. Liver and spleen are not palpable. No other mass or tenderness present.  EXTREMITIES:     There is trace ankle edema. No skin lesions present.Marland Kitchen  NEUROLOGICAL:   Vibration sense is absent in toes. Ankle jerks are absent bilaterally.  Diabetic foot exam:  normal monofilament sensation and pulses, skin is normal with mild dusky redness on the big toes on the plantar surfaces MUSCULOSKELETAL:       There is no enlargement or deformity of the joints. Spine is normal to inspection.Marland Kitchen   SKIN:  intertrigo present in the folds of her skin on the lower abdomen    ASSESSMENT:  Diabetes type 2 with obesity Although her A1c was 7.4 in June her recent blood sugars with metformin alone appear to be reasonably good Rebekah Stewart has started checking her blood sugars and they are fairly good in the morning, has had occasional nonfasting readings that are sporadically higher but under 200 Postprandial glucose today is 133 Rebekah Stewart has improved her diet on her own but would benefit from nutritional counseling Rebekah Stewart does have limited ability to exercise and lose weight For her age and presence of multiple comorbid conditions her control is reasonably good Rebekah Stewart is a good candidate for adding another medication like Januvia to help with postprandial hyperglycemia but currently not clear if Rebekah Stewart is having significant or consistent hyperglycemia after meals as Rebekah Stewart has not been checking at those times  Complications: Peripheral neuropathy, mild without  obvious sensory loss except absent vibration sense   PLAN:   Reduce frequency of monitoring in the morning and check more readings about 2 hours after various meals  Followup in one month for review of home records  Consider using Janumet XR for simplicity on the next visit if Rebekah Stewart is having significant hyperglycemia  Consultation with dietitian  Recommend that Rebekah Stewart followup with cardiology to assess her symptoms of near syncope that Rebekah Stewart is still having, may have had cardiogenic syncope in June rather than hypoglycemia  Counseling time over 50% of today's 45 minute visit  Jaydin Boniface 05/20/2014, 2:16 PM   Note: This office note was prepared with Estate agent. Any transcriptional errors that result from this process are unintentional.

## 2014-05-20 NOTE — Patient Instructions (Signed)
Please check blood sugars once a day on an average with more readings about 2 hours after any meal and once or twice a week on waking up.  Please bring blood sugar monitor to each visit Make sure and have some protein at every meal

## 2014-05-21 DIAGNOSIS — E785 Hyperlipidemia, unspecified: Secondary | ICD-10-CM | POA: Insufficient documentation

## 2014-05-26 ENCOUNTER — Encounter: Payer: Medicare Other | Attending: Endocrinology | Admitting: *Deleted

## 2014-05-26 ENCOUNTER — Encounter: Payer: Self-pay | Admitting: *Deleted

## 2014-05-26 VITALS — Ht 64.0 in | Wt 211.5 lb

## 2014-05-26 DIAGNOSIS — E119 Type 2 diabetes mellitus without complications: Secondary | ICD-10-CM | POA: Diagnosis not present

## 2014-05-26 DIAGNOSIS — Z713 Dietary counseling and surveillance: Secondary | ICD-10-CM | POA: Diagnosis not present

## 2014-05-26 NOTE — Progress Notes (Signed)
Appt start time: 0800 end time:  0930.  Assessment:  Patient was seen on  05/26/14 for individual diabetes education. Rebekah Stewart is a widow that lives alone. She has two children that live in [place]. Rebekah Stewart remains very active, drives, shops and prepares her own meals. However she does not cook meals because she is alone. She grabs things that are quick and easy.  She has a long history of not testing her glucose. Then she was testing FBS only. Per Dr. Dwyane Dee her FBS is good. She has been directed to test 2hpp and random.  Patient Education Plan per assessed needs and concerns is to attend individual session for Diabetes Self Management Education.  Current HbA1c: 7.4%  Preferred Learning Style:   No preference indicated   Learning Readiness:   Ready  MEDICATIONS: See List: Metformin  DIETARY INTAKE: Avoid milk...some lactose intolerance. Rebekah Stewart gets up for the day at 0800. However she does not have anything to eat until 1100.   B (1100 AM): eggs & bacon, yogurt, banana, mixed nuts Snk ( AM):  None L ( PM): none Snk ( 4PM): PopTart          7:00pm - 3hpp glucose 180m/dl D ( 5:00PM): sandwich pimento cheese, salmon & eggs scrambled Snk ( MN PM): peanut butter/crackers Beverages: Diet/caffeine free Soda, Water  Usual physical activity: none, back and leg pain    Intervention:  Nutrition counseling provided.  Discussed diabetes disease process and treatment options.  Discussed physiology of diabetes and role of obesity on insulin resistance.  Encouraged moderate weight reduction to improve glucose levels.  Discussed role of medications and diet in glucose control  Provided education on macronutrients on glucose levels.  Provided education on carb counting, importance of regularly scheduled meals/snacks, and meal planning  Discussed effects of physical activity on glucose levels and long-term glucose control.  Recommended 150 minutes of physical activity/week.   Reviewed patient medications.   Discussed role of medication on blood glucose and possible side effects  Discussed blood glucose monitoring and interpretation.  Discussed recommended target ranges and individual ranges.    Described short-term complications: hyper- and hypo-glycemia.  Discussed causes,symptoms, and treatment options.  Discussed recommendations for long-term diabetes self-care.  Established checklist for medical, dental, and emotional self-care.  Plan:  Aim for 2-3 Carb Choices per meal (30-45 grams) +/- 1 either way  Aim for 0-15 Carbs per snack if hungry  Include protein in moderation with your meals and snacks Consider reading food labels for Total Carbohydrate and Fat Grams of foods Consider checking BG at alternate times to include fasting and 2 hours after after your meal as directed by MD  Consider taking medication as directed by MD  Consider [place]og[place] Peanut butter and banana sandwich Pimento cheese sandwich If eating fruit, consider a protein with it. Eat three regular meals per day. Always start the day with a balanced breakfast and take your medication Consider [place]acon rather than pork bacon  Teaching Method Utilized:  Visual Auditory Hands on  Handouts given during visit include: Living Well with Diabetes Carb Counting and Food Label handouts Meal Plan Card My Plate  Snack Sheet  Support group  Barriers to learning/adherence to lifestyle change: commitment, finances  Diabetes self-care support plan:   [facility]  family  Demonstrated degree of understanding via:  Teach Back   Monitoring/Evaluation:  Dietary intake, exercise, test glucose, and body weight prn.  i

## 2014-05-26 NOTE — Patient Instructions (Signed)
Plan:  Aim for 2-3 Carb Choices per meal (30-45 grams) +/- 1 either way  Aim for 0-15 Carbs per snack if hungry  Include protein in moderation with your meals and snacks Consider reading food labels for Total Carbohydrate and Fat Grams of foods Consider checking BG at alternate times to include fasting and 2 hours after after your meal as directed by MD  Consider taking medication as directed by MD  Consider [place] yogurt Peanut butter and banana sandwich Pimento cheese sandwich If eating fruit, consider a protein with it. Eat three regular meals per day. Always start the day with a balanced breakfast and take your medication Consider [place] bacon rather than pork bacon

## 2014-06-21 DIAGNOSIS — Z7901 Long term (current) use of anticoagulants: Secondary | ICD-10-CM | POA: Diagnosis not present

## 2014-06-21 DIAGNOSIS — Z79899 Other long term (current) drug therapy: Secondary | ICD-10-CM | POA: Diagnosis not present

## 2014-06-21 DIAGNOSIS — Z6837 Body mass index (BMI) 37.0-37.9, adult: Secondary | ICD-10-CM | POA: Diagnosis not present

## 2014-06-21 DIAGNOSIS — N390 Urinary tract infection, site not specified: Secondary | ICD-10-CM | POA: Diagnosis not present

## 2014-06-21 DIAGNOSIS — E559 Vitamin D deficiency, unspecified: Secondary | ICD-10-CM | POA: Diagnosis not present

## 2014-06-21 DIAGNOSIS — R32 Unspecified urinary incontinence: Secondary | ICD-10-CM | POA: Diagnosis not present

## 2014-06-21 DIAGNOSIS — E785 Hyperlipidemia, unspecified: Secondary | ICD-10-CM | POA: Diagnosis not present

## 2014-06-21 DIAGNOSIS — E1149 Type 2 diabetes mellitus with other diabetic neurological complication: Secondary | ICD-10-CM | POA: Diagnosis not present

## 2014-06-21 DIAGNOSIS — I10 Essential (primary) hypertension: Secondary | ICD-10-CM | POA: Diagnosis not present

## 2014-06-21 LAB — HEMOGLOBIN A1C: HEMOGLOBIN A1C: 7.2 % — AB (ref 4.0–6.0)

## 2014-06-23 ENCOUNTER — Other Ambulatory Visit: Payer: PRIVATE HEALTH INSURANCE

## 2014-06-24 ENCOUNTER — Ambulatory Visit: Payer: PRIVATE HEALTH INSURANCE | Admitting: Endocrinology

## 2014-06-24 ENCOUNTER — Encounter: Payer: Self-pay | Admitting: Endocrinology

## 2014-06-24 ENCOUNTER — Ambulatory Visit (INDEPENDENT_AMBULATORY_CARE_PROVIDER_SITE_OTHER): Payer: Medicare Other | Admitting: Endocrinology

## 2014-06-24 VITALS — BP 136/87 | HR 75 | Temp 97.9°F | Wt 208.0 lb

## 2014-06-24 DIAGNOSIS — E1165 Type 2 diabetes mellitus with hyperglycemia: Principal | ICD-10-CM

## 2014-06-24 DIAGNOSIS — IMO0001 Reserved for inherently not codable concepts without codable children: Secondary | ICD-10-CM

## 2014-06-24 DIAGNOSIS — E559 Vitamin D deficiency, unspecified: Secondary | ICD-10-CM | POA: Diagnosis not present

## 2014-06-24 DIAGNOSIS — E7800 Pure hypercholesterolemia, unspecified: Secondary | ICD-10-CM | POA: Diagnosis not present

## 2014-06-24 DIAGNOSIS — I2510 Atherosclerotic heart disease of native coronary artery without angina pectoris: Secondary | ICD-10-CM | POA: Diagnosis not present

## 2014-06-24 MED ORDER — GLUCOSE BLOOD VI STRP: ORAL_STRIP | Status: DC

## 2014-06-24 MED ORDER — METFORMIN HCL 500 MG PO TABS: 500.0000 mg | ORAL_TABLET | Freq: Three times a day (TID) | ORAL | Status: DC

## 2014-06-24 NOTE — Progress Notes (Signed)
Pre-visit review using our clinic review tool, if applicable. No additional management support is needed unless otherwise documented below in the visit note.

## 2014-06-24 NOTE — Patient Instructions (Addendum)
Add 2nd Metformin at dinner  Please check blood sugars at least half the time about 2 hours after any meal and 2-3 times per week on waking up. Please bring blood sugar monitor to each visit  Walk as much as possible  Vitamin D3: 2000 units daily

## 2014-06-24 NOTE — Progress Notes (Signed)
Patient ID: Rebekah Stewart, female   DOB: 1935/05/22, 79 y.o.   MRN: [id number]           Reason for Appointment:  Followup for Type 2 Diabetes  Referring physician: Nelda Bucks  History of Present Illness:          Diagnosis: Type 2 diabetes mellitus, date of diagnosis: 2005       Past history: She had been on metformin for several years for her management been taking 500 mg twice a day Also at some point she was given glipizide 5 mg daily with reportedly good control However she usually would not be checking her blood sugar at home She had been fairly stable with taking metformin and glipizide although not checking blood sugars at home previously  Recent history:  In 2015 her A1c has been around 7%, was 7.1 in 3/15 and 7.4 in 6/15 In June when she was having an eye exam she had near syncope Subsequently her glipizide was stopped and she has been on metformin twice a day Also because of her relatively high A1c she started watching her diet more carefully and cutting back on high fat foods, carbohydrates and sweets She was having fairly good fasting readings on her initial visit but rare postprandial readings Again checking her blood sugars primarily in the mornings and these are mildly increased; does have high normal readings postprandially later in the day Her highest reading was 270 but this was after her birthday celebration She generally watching her diet but unable to exercise       Oral hypoglycemic drugs the patient is taking are:   metformin   500 twice a day    Side effects from medications have been: None Compliance with the medical regimen: Good Hypoglycemia: not documented  Glucose monitoring:  done one time a day         Glucometer:  Accu-Chek       Blood Glucose readings by time of day and averages from meter download:  PREMEAL Breakfast  1-3 PM  Dinner pcs Overall  Glucose range:  107-148   115-187   180  105-270   Mean/median:     180  147    Glycemic  control:   Lab Results  Component Value Date   HGBA1C 7.2* 06/21/2014   HGBA1C 7.4* 03/12/2014   Lab Results  Component Value Date   LDLCALC 75 03/12/2014   CREATININE 0.95 04/23/2014   Microalbumin/creatinine ratio normal in 12/14  Retinal exam: Most recent: 6/15, reportedly normal.   Self-care: The diet that the patient has been following is: tries to limit portions.      Meals: 3 meals per day.          Exercise:  minimal         Dietician visit: Most recent: At diagnosis.               Weight history:   Wt Readings from Last 3 Encounters:  06/24/14 208 lb (94.348 kg)  05/26/14 211 lb 8 oz (95.936 kg)  05/20/14 215 lb 9.6 oz (97.796 kg)       Medication List       This list is accurate as of: 06/24/14  5:12 PM.  Always use your most recent med list.               amLODipine 5 MG tablet  Commonly known as:  NORVASC  Take 5 mg by mouth daily.  aspirin 81 MG EC tablet  Take 81 mg by mouth daily.     atorvastatin 40 MG tablet  Commonly known as:  LIPITOR  Take 40 mg by mouth daily.     benazepril 20 MG tablet  Commonly known as:  LOTENSIN  Take 20 mg by mouth daily.     carvedilol 25 MG tablet  Commonly known as:  COREG  Take 25 mg by mouth 2 (two) times daily with a meal.     ciprofloxacin 250 MG tablet  Commonly known as:  CIPRO     fish oil-omega-3 fatty acids 1000 MG capsule  Take 2 g by mouth 2 (two) times daily.     furosemide 40 MG tablet  Commonly known as:  LASIX  Take 20 mg by mouth daily.     glucose blood test strip  Commonly known as:  ACCU-CHEK AVIVA PLUS  Check two times daily.     isosorbide mononitrate 60 MG 24 hr tablet  Commonly known as:  IMDUR  Take 60 mg by mouth daily.     metFORMIN 500 MG tablet  Commonly known as:  GLUCOPHAGE  Take 1 tablet (500 mg total) by mouth 3 (three) times daily.     nitroGLYCERIN 0.4 MG SL tablet  Commonly known as:  NITROSTAT  Place 1 tablet (0.4 mg total) under the tongue every 5 (five)  minutes as needed for chest pain.     spironolactone 25 MG tablet  Commonly known as:  ALDACTONE  Take 1 tablet (25 mg total) by mouth daily.     warfarin 5 MG tablet  Commonly known as:  COUMADIN  Take 5 mg by mouth daily.        Allergies:  Allergies  Allergen Reactions  . Bee Venom Shortness Of Breath and Swelling  . Iodinated Diagnostic Agents Itching  . Penicillins Swelling  . Tape     Pulls the skin    Past Medical History  Diagnosis Date  . UTI (lower urinary tract infection)   . Swelling of limb   . Varicose veins of lower extremities with other complications   . Osteoarthrosis, unspecified whether generalized or localized, unspecified site   . Lumbosacral spondylosis without myelopathy   . Hemorrhoids   . DM2 (diabetes mellitus, type 2)   . Colonic polyp   . Degeneration of lumbar or lumbosacral intervertebral disc   . Diverticulosis   . HLD (hyperlipidemia)   . Pain in thoracic spine   . Atrial fibrillation 2010  . HTN (hypertension)     diagnosed when she was in her 56s. Refractory. No RAS by [place] in 2010  . Coronary artery disease     mild non obstructive disease per cardiac cath in 2010  . Congestive heart failure, unspecified   . Chronic diastolic heart failure   . Atrial fibrillation     Past Surgical History  Procedure Laterality Date  . Tonsillectomy    . Adenoidectomy    . Cardiac catheterization  2010    At [place] [facility]. Mild nonobstructive CAD  . Eye surgery      Family History  Problem Relation Age of Onset  . Heart disease    . Cancer Sister   . Stroke Mother   . Heart attack Father     Social History:  reports that she has never smoked. She has never used smokeless tobacco. She reports that she does not drink alcohol or use illicit drugs.    Review of  Systems       Lipids: She has been on Lipitor 40 mg for hyperlipidemia, followed by PCP, LDL was 75 in 6/15 with HDL 52       No results found for this basename: CHOL,  HDL,   LDLCALC,  LDLDIRECT,  TRIG,  CHOLHDL                     The blood pressure has been treated with multiple medications including benazepril 20 mg twice a day previously In June she appeared to have relatively low blood pressure readings when checking at home and her benazepril has been reduced to 20 mg once a day Also not taking spironolactone now  Labs from her PCP done recently were reviewed; she does have a UTI and is on Cipro    Diabetic  Complications: Peripheral neuropathy, mild without obvious sensory loss except absent vibration sense Her microalbumin ratio is high recently but she also has a UTI, previously normal      No history of Numbness, tingling in feet; may have some burning sensation at times Diabetic foot exam in 8/15:  normal monofilament sensation and pulses, skin is normal with mild dusky redness on the big toes on the plantar surfaces  VITAMIN D deficiency: She has a level of about 18 recently and is not taking her prescription vitamin D. since it was expensive  LABS:  Office Visit on 06/24/2014  Component Date Value Ref Range Status  . Hemoglobin A1C 06/21/2014 7.2* 4.0 - 6.0 % Final    Physical Examination:  BP 136/87  Pulse 75  Temp(Src) 97.9 F (36.6 C)  Wt 208 lb (94.348 kg)  SpO2 96%  No ankle edema present   ASSESSMENT:  Diabetes type 2 with obesity Her A1c is slightly better at 7.2 recently She does have some tendency to postprandial hyperglycemia but overall her blood sugars are reasonably good for her age and comorbid conditions She is usually watching her diet and has lost a little weight She has numerous questions about her diet as well as about home glucose monitoring, blood sugar targets and medications  VITAMIN D deficiency: She has a level of about 18 recently and is not taking her prescription vitamin D. since it was expensive  Increase microalbumin: Likely to be false-positive because of UTI, reassured  patient  Hypercholesterolemia: Excellent with recent LDL 69  Hypertension: Followed by PCP  PLAN:   Start monitoring about half of her readings after meals and less often in the mornings  Increase suppertime metformin to 2 tablets  Consider adding Januvia if having worsening glucose levels or significant postprandial hyperglycemia. However she is reluctant to take brand name or expensive medications  Consultation with diabetes here for meal planning and other general diabetes education  Check A1c in 3 months  May try OTC vitamin D3, 2000 units daily  Counseling time over 50% of today's 25 minute visit   Kymani Shimabukuro 06/24/2014, 5:12 PM   Note: This office note was prepared with Estate agent. Any transcriptional errors that result from this process are unintentional.

## 2014-06-28 DIAGNOSIS — R791 Abnormal coagulation profile: Secondary | ICD-10-CM | POA: Diagnosis not present

## 2014-06-30 DIAGNOSIS — IMO0001 Reserved for inherently not codable concepts without codable children: Secondary | ICD-10-CM | POA: Diagnosis not present

## 2014-07-02 DIAGNOSIS — I10 Essential (primary) hypertension: Secondary | ICD-10-CM | POA: Diagnosis not present

## 2014-07-02 DIAGNOSIS — N390 Urinary tract infection, site not specified: Secondary | ICD-10-CM | POA: Diagnosis not present

## 2014-07-02 DIAGNOSIS — E1140 Type 2 diabetes mellitus with diabetic neuropathy, unspecified: Secondary | ICD-10-CM | POA: Diagnosis not present

## 2014-07-02 DIAGNOSIS — Z6837 Body mass index (BMI) 37.0-37.9, adult: Secondary | ICD-10-CM | POA: Diagnosis not present

## 2014-07-05 DIAGNOSIS — Z7901 Long term (current) use of anticoagulants: Secondary | ICD-10-CM | POA: Diagnosis not present

## 2014-07-08 ENCOUNTER — Ambulatory Visit: Payer: PRIVATE HEALTH INSURANCE | Admitting: Dietician

## 2014-07-12 DIAGNOSIS — Z23 Encounter for immunization: Secondary | ICD-10-CM | POA: Diagnosis not present

## 2014-07-12 DIAGNOSIS — Z7901 Long term (current) use of anticoagulants: Secondary | ICD-10-CM | POA: Diagnosis not present

## 2014-07-22 DIAGNOSIS — Z7901 Long term (current) use of anticoagulants: Secondary | ICD-10-CM | POA: Diagnosis not present

## 2014-07-27 ENCOUNTER — Telehealth: Payer: Self-pay | Admitting: Endocrinology

## 2014-07-27 ENCOUNTER — Other Ambulatory Visit: Payer: Self-pay | Admitting: *Deleted

## 2014-07-27 DIAGNOSIS — E1165 Type 2 diabetes mellitus with hyperglycemia: Secondary | ICD-10-CM

## 2014-07-27 DIAGNOSIS — IMO0002 Reserved for concepts with insufficient information to code with codable children: Secondary | ICD-10-CM

## 2014-07-27 MED ORDER — GLUCOSE BLOOD VI STRP: ORAL_STRIP | Status: DC

## 2014-07-27 NOTE — Telephone Encounter (Signed)
Patient need Hard copy prescription for Accu Check Aviva test strips, and the current ICD 10 Code. Send to pharmacy

## 2014-07-30 DIAGNOSIS — R791 Abnormal coagulation profile: Secondary | ICD-10-CM | POA: Diagnosis not present

## 2014-08-16 DIAGNOSIS — R791 Abnormal coagulation profile: Secondary | ICD-10-CM | POA: Diagnosis not present

## 2014-08-17 DIAGNOSIS — E119 Type 2 diabetes mellitus without complications: Secondary | ICD-10-CM | POA: Diagnosis not present

## 2014-08-18 ENCOUNTER — Ambulatory Visit: Payer: Medicare Other

## 2014-08-23 DIAGNOSIS — R791 Abnormal coagulation profile: Secondary | ICD-10-CM | POA: Diagnosis not present

## 2014-08-30 DIAGNOSIS — R791 Abnormal coagulation profile: Secondary | ICD-10-CM | POA: Diagnosis not present

## 2014-09-14 DIAGNOSIS — R791 Abnormal coagulation profile: Secondary | ICD-10-CM | POA: Diagnosis not present

## 2014-09-21 ENCOUNTER — Ambulatory Visit (INDEPENDENT_AMBULATORY_CARE_PROVIDER_SITE_OTHER): Payer: Medicare Other | Admitting: Endocrinology

## 2014-09-21 ENCOUNTER — Encounter: Payer: Self-pay | Admitting: Endocrinology

## 2014-09-21 VITALS — BP 131/69 | HR 75 | Temp 98.3°F | Resp 16 | Ht 64.0 in | Wt 211.4 lb

## 2014-09-21 DIAGNOSIS — I2510 Atherosclerotic heart disease of native coronary artery without angina pectoris: Secondary | ICD-10-CM

## 2014-09-21 DIAGNOSIS — E1165 Type 2 diabetes mellitus with hyperglycemia: Secondary | ICD-10-CM | POA: Diagnosis not present

## 2014-09-21 DIAGNOSIS — IMO0002 Reserved for concepts with insufficient information to code with codable children: Secondary | ICD-10-CM

## 2014-09-21 DIAGNOSIS — I10 Essential (primary) hypertension: Secondary | ICD-10-CM

## 2014-09-21 LAB — URINALYSIS, ROUTINE W REFLEX MICROSCOPIC
Bilirubin Urine: NEGATIVE
Hgb urine dipstick: NEGATIVE
Ketones, ur: NEGATIVE
Nitrite: NEGATIVE
RBC / HPF: NONE SEEN (ref 0–?)
Specific Gravity, Urine: 1.01 (ref 1.000–1.030)
TOTAL PROTEIN, URINE-UPE24: NEGATIVE
UROBILINOGEN UA: 0.2 (ref 0.0–1.0)
Urine Glucose: NEGATIVE
pH: 5 (ref 5.0–8.0)

## 2014-09-21 LAB — BASIC METABOLIC PANEL
BUN: 26 mg/dL — AB (ref 6–23)
CHLORIDE: 103 meq/L (ref 96–112)
CO2: 28 mEq/L (ref 19–32)
Calcium: 9.1 mg/dL (ref 8.4–10.5)
Creatinine, Ser: 1 mg/dL (ref 0.4–1.2)
GFR: 56.45 mL/min — ABNORMAL LOW (ref >60.00)
Glucose, Bld: 124 mg/dL — ABNORMAL HIGH (ref 70–99)
Potassium: 4 mEq/L (ref 3.5–5.1)
Sodium: 139 mEq/L (ref 135–145)

## 2014-09-21 LAB — MICROALBUMIN / CREATININE URINE RATIO
Creatinine,U: 55.2 mg/dL
MICROALB UR: 0.6 mg/dL (ref 0.0–1.9)
Microalb Creat Ratio: 1.1 mg/g (ref 0.0–30.0)

## 2014-09-21 LAB — HEMOGLOBIN A1C: Hgb A1c MFr Bld: 7.8 % — ABNORMAL HIGH (ref 4.6–6.5)

## 2014-09-21 NOTE — Patient Instructions (Signed)
Please check blood sugars at least half the time about 2 hours after any meal and 3 times per week on waking up. Please bring blood sugar monitor to each visit  OTC vitamin D3, 2000 units daily

## 2014-09-21 NOTE — Progress Notes (Signed)
Quick Note:  A1c 7.8, higher, increase metformin to 3 tablets a day. Urine protein normal  ______

## 2014-09-21 NOTE — Progress Notes (Signed)
Patient ID: Rebekah Stewart, female   DOB: 1935-05-28, 79 y.o.   MRN: [id number]           Reason for Appointment:  Followup for Type 2 Diabetes  Referring physician: Nelda Bucks  History of Present Illness:          Diagnosis: Type 2 diabetes mellitus, date of diagnosis: 2005       Past history: She had been on metformin for several years for her management been taking 500 mg twice a day Also at some point she was given glipizide 5 mg daily with reportedly good control However she usually would not be checking her blood sugar at home She had been fairly stable with taking metformin and glipizide although not checking blood sugars at home previously In June when she was having an eye exam she had near syncope Subsequently her glipizide was stopped and she has been on metformin twice a day  Recent history:  In 2015 her A1c has been around 7%, was 7.1 in 3/15 and 7.4 in 6/15 Also because of her relatively high A1c she started watching her diet more carefully and cutting back on high fat foods, carbohydrates and sweets She was having fairly good fasting readings on her initial visit but rare postprandial readings Again checking her blood sugars primarily in the mornings and these are relatively good Has occasional nonfasting readings that are slightly high at times She was reluctant to consider Januvia because of cost and was told to increase her metformin to 3 tablets today but she is still taking 2 She generally watching her diet but unable to exercise       Oral hypoglycemic drugs the patient is taking are:   metformin  500 twice a day    Side effects from medications have been: None Compliance with the medical regimen: Good Hypoglycemia: not documented  Glucose monitoring:  done one time a day         Glucometer:  Accu-Chek       Blood Glucose readings by time of day and averages from meter download:  PRE-MEAL Breakfast  11 AM-12 PM   1-4 PM  Bedtime Overall  Glucose range:  87-131  97, 176  133,179 83   Mean/median:  107      120    Glycemic control:   Lab Results  Component Value Date   HGBA1C 7.2* 06/21/2014   HGBA1C 7.4* 03/12/2014   Lab Results  Component Value Date   LDLCALC 75 03/12/2014   CREATININE 0.95 04/23/2014   Microalbumin/creatinine ratio normal in 12/14  Retinal exam: Most recent: 6/15, reportedly normal.   Self-care: The diet that the patient has been following is: tries to limit portions.      Meals: 3 meals per day.          Exercise:  minimal         Dietician visit: Most recent: 8/15.               Weight history:   Wt Readings from Last 3 Encounters:  09/21/14 211 lb 6.4 oz (95.89 kg)  06/24/14 208 lb (94.348 kg)  05/26/14 211 lb 8 oz (95.936 kg)       Medication List       This list is accurate as of: 09/21/14  9:10 AM.  Always use your most recent med list.               amLODipine 5 MG tablet  Commonly known  as:  NORVASC  Take 5 mg by mouth daily. Takes 1/2 to 1 tablet daily     aspirin 81 MG EC tablet  Take 81 mg by mouth daily.     atorvastatin 40 MG tablet  Commonly known as:  LIPITOR  Take 40 mg by mouth daily.     benazepril 20 MG tablet  Commonly known as:  LOTENSIN  Take 20 mg by mouth daily.     carvedilol 25 MG tablet  Commonly known as:  COREG  Take 25 mg by mouth 2 (two) times daily with a meal.     ciprofloxacin 250 MG tablet  Commonly known as:  CIPRO     fish oil-omega-3 fatty acids 1000 MG capsule  Take 2 g by mouth 2 (two) times daily.     furosemide 40 MG tablet  Commonly known as:  LASIX  Take 20 mg by mouth daily.     glucose blood test strip  Commonly known as:  ACCU-CHEK AVIVA PLUS  Check two times daily. Dx code E11.65     isosorbide mononitrate 60 MG 24 hr tablet  Commonly known as:  IMDUR  Take 60 mg by mouth daily.     metFORMIN 500 MG tablet  Commonly known as:  GLUCOPHAGE  Take 1 tablet (500 mg total) by mouth 3 (three) times daily.     nitroGLYCERIN  0.4 MG SL tablet  Commonly known as:  NITROSTAT  Place 1 tablet (0.4 mg total) under the tongue every 5 (five) minutes as needed for chest pain.     spironolactone 25 MG tablet  Commonly known as:  ALDACTONE  Take 1 tablet (25 mg total) by mouth daily.     warfarin 5 MG tablet  Commonly known as:  COUMADIN  Take 5 mg by mouth daily.     warfarin 4 MG tablet  Commonly known as:  COUMADIN  Take 4 mg by mouth daily.        Allergies:  Allergies  Allergen Reactions  . Bee Venom Shortness Of Breath and Swelling  . Iodinated Diagnostic Agents Itching  . Penicillins Swelling  . Tape     Pulls the skin    Past Medical History  Diagnosis Date  . UTI (lower urinary tract infection)   . Swelling of limb   . Varicose veins of lower extremities with other complications   . Osteoarthrosis, unspecified whether generalized or localized, unspecified site   . Lumbosacral spondylosis without myelopathy   . Hemorrhoids   . DM2 (diabetes mellitus, type 2)   . Colonic polyp   . Degeneration of lumbar or lumbosacral intervertebral disc   . Diverticulosis   . HLD (hyperlipidemia)   . Pain in thoracic spine   . Atrial fibrillation 2010  . HTN (hypertension)     diagnosed when she was in her 10s. Refractory. No RAS by [place] in 2010  . Coronary artery disease     mild non obstructive disease per cardiac cath in 2010  . Congestive heart failure, unspecified   . Chronic diastolic heart failure   . Atrial fibrillation     Past Surgical History  Procedure Laterality Date  . Tonsillectomy    . Adenoidectomy    . Cardiac catheterization  2010    At [facility]. Mild nonobstructive CAD  . Eye surgery      Family History  Problem Relation Age of Onset  . Heart disease    . Cancer Sister   .  Stroke Mother   . Heart attack Father     Social History:  reports that she has never smoked. She has never used smokeless tobacco. She reports that she does not drink alcohol or use illicit  drugs.    Review of Systems       Lipids: She has been on Lipitor 40 mg for hyperlipidemia, followed by PCP, LDL was 75 in 6/15 with HDL 52       No results found for: CHOL                   The blood pressure has been treated with multiple medications including benazepril 20 mg twice a day previously In June she appeared to have relatively low blood pressure readings when checking at home and her benazepril doses 20 mg day Also on spironolactone      Diabetic  Complications: Peripheral neuropathy, mild without obvious sensory loss except absent vibration sense Her microalbumin ratio is high recently but she also has a UTI, previously normal      No history of Numbness, tingling in feet; no recent burning sensation at times Diabetic foot exam in 8/15:  normal monofilament sensation and pulses, skin is normal with mild dusky redness on the big toes on the plantar surfaces  VITAMIN D deficiency: She has a level of about 18 and is still not taking her OTC vitamin D As directed on her last visit  LABS:  No visits with results within 1 Week(s) from this visit. Latest known visit with results is:  Office Visit on 06/24/2014  Component Date Value Ref Range Status  . Hgb A1c MFr Bld 06/21/2014 7.2* 4.0 - 6.0 % Final    Physical Examination:  BP 131/69 mmHg  Pulse 75  Temp(Src) 98.3 F (36.8 C)  Resp 16  Ht _0  (1.626 m)  Wt 211 lb 6.4 oz (95.89 kg)  BMI 36.27 kg/m2  SpO2 96%  No ankle edema present   ASSESSMENT:  Diabetes type 2 with obesity Her A1c needs to be checked today She is on metformin alone and appears to have some high post.  Readings She has limited ability to exercise and lose weight She does have some tendency to postprandial hyperglycemia but overall her blood sugars are reasonably good for her age and comorbid conditions She is usually watching her diet and has lost a little weight She has numerous questions about her diet as well as about home glucose  monitoring, blood sugar targets and medications  VITAMIN D deficiency: She has a level of about 18  and is not taking her vitamin D  Increase microalbumin: Likely to be false-positive because of UTI, will recheck today  Hypertension: Followed by PCP  PLAN:   Start monitoring about half of her readings after meals and less often in the mornings  Increase suppertime metformin to 2 tablets  Consider adding Januvia if blood sugars still continue to be high  Start OTC vitamin D3, 2000 units daily    Sawyer Mentzer 09/21/2014, 9:10 AM   Note: This office note was prepared with Estate agent. Any transcriptional errors that result from this process are unintentional.   Addendum: Labs as follows A1c 7.8, will increase metformin to 3 tablets a day  Office Visit on 09/21/2014  Component Date Value Ref Range Status  . Hgb A1c MFr Bld 09/21/2014 7.8* 4.6 - 6.5 % Final   Glycemic Control Guidelines for People with Diabetes:Non Diabetic:  <6%Goal of Therapy: <  7%Additional Action Suggested:  >8%   . Sodium 09/21/2014 139  135 - 145 mEq/L Final  . Potassium 09/21/2014 4.0  3.5 - 5.1 mEq/L Final  . Chloride 09/21/2014 103  96 - 112 mEq/L Final  . CO2 09/21/2014 28  19 - 32 mEq/L Final  . Glucose, Bld 09/21/2014 124* 70 - 99 mg/dL Final  . BUN 09/21/2014 26* 6 - 23 mg/dL Final  . Creatinine, Ser 09/21/2014 1.0  0.4 - 1.2 mg/dL Final  . Calcium 09/21/2014 9.1  8.4 - 10.5 mg/dL Final  . GFR 09/21/2014 56.45* >60.00 mL/min Final  . Microalb, Ur 09/21/2014 0.6  0.0 - 1.9 mg/dL Final  . Creatinine,U 09/21/2014 55.2   Final  . Microalb Creat Ratio 09/21/2014 1.1  0.0 - 30.0 mg/g Final  . Color, Urine 09/21/2014 YELLOW  Yellow;Lt. Yellow Final  . APPearance 09/21/2014 CLEAR  Clear Final  . Specific Gravity, Urine 09/21/2014 1.010  1.000-1.030 Final  . pH 09/21/2014 5.0  5.0 - 8.0 Final  . Total Protein, Urine 09/21/2014 NEGATIVE  Negative Final  . Urine Glucose  09/21/2014 NEGATIVE  Negative Final  . Ketones, ur 09/21/2014 NEGATIVE  Negative Final  . Bilirubin Urine 09/21/2014 NEGATIVE  Negative Final  . Hgb urine dipstick 09/21/2014 NEGATIVE  Negative Final  . Urobilinogen, UA 09/21/2014 0.2  0.0 - 1.0 Final  . Leukocytes, UA 09/21/2014 SMALL* Negative Final  . Nitrite 09/21/2014 NEGATIVE  Negative Final  . WBC, UA 09/21/2014 7-10/hpf* 0-2/hpf Final  . RBC / HPF 09/21/2014 none seen  0-2/hpf Final  . Mucus, UA 09/21/2014 Presence of* None Final  . Squamous Epithelial / LPF 09/21/2014 Rare(0-4/hpf)  Rare(0-4/hpf) Final

## 2014-09-22 ENCOUNTER — Ambulatory Visit: Payer: PRIVATE HEALTH INSURANCE | Admitting: Endocrinology

## 2014-09-23 ENCOUNTER — Telehealth: Payer: Self-pay | Admitting: Endocrinology

## 2014-09-23 DIAGNOSIS — E1140 Type 2 diabetes mellitus with diabetic neuropathy, unspecified: Secondary | ICD-10-CM | POA: Diagnosis not present

## 2014-09-23 DIAGNOSIS — I2510 Atherosclerotic heart disease of native coronary artery without angina pectoris: Secondary | ICD-10-CM | POA: Diagnosis not present

## 2014-09-23 DIAGNOSIS — E559 Vitamin D deficiency, unspecified: Secondary | ICD-10-CM | POA: Diagnosis not present

## 2014-09-23 DIAGNOSIS — I10 Essential (primary) hypertension: Secondary | ICD-10-CM | POA: Diagnosis not present

## 2014-09-23 DIAGNOSIS — Z23 Encounter for immunization: Secondary | ICD-10-CM | POA: Diagnosis not present

## 2014-09-23 DIAGNOSIS — I4891 Unspecified atrial fibrillation: Secondary | ICD-10-CM | POA: Diagnosis not present

## 2014-09-23 DIAGNOSIS — R791 Abnormal coagulation profile: Secondary | ICD-10-CM | POA: Diagnosis not present

## 2014-09-23 DIAGNOSIS — E785 Hyperlipidemia, unspecified: Secondary | ICD-10-CM | POA: Diagnosis not present

## 2014-09-23 NOTE — Telephone Encounter (Signed)
Labs sent

## 2014-09-23 NOTE — Telephone Encounter (Signed)
Please send lab results to Dr. [name] in [place]

## 2014-09-27 DIAGNOSIS — E1140 Type 2 diabetes mellitus with diabetic neuropathy, unspecified: Secondary | ICD-10-CM | POA: Diagnosis not present

## 2014-09-27 DIAGNOSIS — E559 Vitamin D deficiency, unspecified: Secondary | ICD-10-CM | POA: Diagnosis not present

## 2014-09-27 DIAGNOSIS — I10 Essential (primary) hypertension: Secondary | ICD-10-CM | POA: Diagnosis not present

## 2014-09-27 DIAGNOSIS — E785 Hyperlipidemia, unspecified: Secondary | ICD-10-CM | POA: Diagnosis not present

## 2014-09-27 DIAGNOSIS — Z79899 Other long term (current) drug therapy: Secondary | ICD-10-CM | POA: Diagnosis not present

## 2014-10-07 DIAGNOSIS — R791 Abnormal coagulation profile: Secondary | ICD-10-CM | POA: Diagnosis not present

## 2014-10-21 DIAGNOSIS — R791 Abnormal coagulation profile: Secondary | ICD-10-CM | POA: Diagnosis not present

## 2014-11-04 DIAGNOSIS — R791 Abnormal coagulation profile: Secondary | ICD-10-CM | POA: Diagnosis not present

## 2014-12-06 DIAGNOSIS — I4891 Unspecified atrial fibrillation: Secondary | ICD-10-CM | POA: Diagnosis not present

## 2014-12-06 DIAGNOSIS — I2510 Atherosclerotic heart disease of native coronary artery without angina pectoris: Secondary | ICD-10-CM | POA: Diagnosis not present

## 2014-12-06 DIAGNOSIS — Z7901 Long term (current) use of anticoagulants: Secondary | ICD-10-CM | POA: Diagnosis not present

## 2014-12-06 DIAGNOSIS — R0789 Other chest pain: Secondary | ICD-10-CM | POA: Diagnosis not present

## 2014-12-06 DIAGNOSIS — Z6837 Body mass index (BMI) 37.0-37.9, adult: Secondary | ICD-10-CM | POA: Diagnosis not present

## 2014-12-06 DIAGNOSIS — R0602 Shortness of breath: Secondary | ICD-10-CM | POA: Diagnosis not present

## 2014-12-07 ENCOUNTER — Ambulatory Visit (INDEPENDENT_AMBULATORY_CARE_PROVIDER_SITE_OTHER): Payer: Medicare Other | Admitting: Cardiovascular Disease

## 2014-12-07 ENCOUNTER — Encounter: Payer: Self-pay | Admitting: Cardiovascular Disease

## 2014-12-07 VITALS — BP 130/70 | HR 80 | Ht 64.0 in | Wt 210.8 lb

## 2014-12-07 DIAGNOSIS — I5032 Chronic diastolic (congestive) heart failure: Secondary | ICD-10-CM

## 2014-12-07 DIAGNOSIS — I482 Chronic atrial fibrillation, unspecified: Secondary | ICD-10-CM

## 2014-12-07 DIAGNOSIS — I10 Essential (primary) hypertension: Secondary | ICD-10-CM | POA: Diagnosis not present

## 2014-12-07 DIAGNOSIS — I25118 Atherosclerotic heart disease of native coronary artery with other forms of angina pectoris: Secondary | ICD-10-CM

## 2014-12-07 NOTE — Progress Notes (Signed)
Primary care physician: Dr. [name].   HPI  This is a 80 year old female who is here today for a followup visit regarding chronic atrial fibrillation, refractory hypertension and diastolic heart failure. She had a cardiac catheterization done in 2010 which showed mild nonobstructive coronary artery disease. She has chronic lower extremity edema which has not worsened. She has multiple chronic medical conditions including Diabetes and hyperlipidemia. She underwent a nuclear stress test in June 2015 which showed no evidence of ischemia with normal ejection fraction. She has been having intermittent episodes of substernal and left-sided chest pain described as constant aching which sometimes can continue for few days. It is worse with certain movements. It has no association with activities. She has chronic exertional dyspnea with no recent worsening. She saw Dr. [name] recently and had troponin done which was negative.  Allergies  Allergen Reactions  . Bee Venom Shortness Of Breath and Swelling  . Iodinated Diagnostic Agents Itching  . Penicillins Swelling  . Tape     Pulls the skin     Current Outpatient Prescriptions on File Prior to Visit  Medication Sig Dispense Refill  . amLODipine (NORVASC) 5 MG tablet Take 5 mg by mouth daily. Takes 1/2 to 1 tablet daily    . aspirin 81 MG EC tablet Take 81 mg by mouth daily.      Marland Kitchen atorvastatin (LIPITOR) 40 MG tablet Take 40 mg by mouth daily.    . benazepril (LOTENSIN) 20 MG tablet Take 20 mg by mouth daily.     . carvedilol (COREG) 25 MG tablet Take 25 mg by mouth 2 (two) times daily with a meal.    . fish oil-omega-3 fatty acids 1000 MG capsule Take 2 g by mouth 2 (two) times daily.     . furosemide (LASIX) 40 MG tablet Take 20 mg by mouth daily.     Marland Kitchen glucose blood (ACCU-CHEK AVIVA PLUS) test strip Check two times daily. Dx code E11.65 100 each 1  . isosorbide mononitrate (IMDUR) 60 MG 24 hr tablet Take 60 mg by mouth daily.      .  metFORMIN (GLUCOPHAGE) 500 MG tablet Take 1 tablet (500 mg total) by mouth 3 (three) times daily. (Patient taking differently: Take 500 mg by mouth 2 (two) times daily with a meal. ) 270 tablet 1  . nystatin (MYCOSTATIN/NYSTOP) 100000 UNIT/GM POWD Apply topically 2 (two) times daily.    Marland Kitchen warfarin (COUMADIN) 5 MG tablet Take 5 mg by mouth daily.     Marland Kitchen spironolactone (ALDACTONE) 25 MG tablet Take 1 tablet (25 mg total) by mouth daily. 30 tablet 6   No current facility-administered medications on file prior to visit.     Past Medical History  Diagnosis Date  . UTI (lower urinary tract infection)   . Swelling of limb   . Varicose veins of lower extremities with other complications   . Osteoarthrosis, unspecified whether generalized or localized, unspecified site   . Lumbosacral spondylosis without myelopathy   . Hemorrhoids   . DM2 (diabetes mellitus, type 2)   . Colonic polyp   . Degeneration of lumbar or lumbosacral intervertebral disc   . Diverticulosis   . HLD (hyperlipidemia)   . Pain in thoracic spine   . Atrial fibrillation 2010  . HTN (hypertension)     diagnosed when she was in her 49s. Refractory. No RAS by [place] in 2010  . Coronary artery disease     mild non obstructive disease per cardiac cath in  2010  . Congestive heart failure, unspecified   . Chronic diastolic heart failure   . Atrial fibrillation   . Compression fracture of thoracic vertebra   . Atrial fibrillation   . Long-term (current) use of anticoagulants   . Arthritis of back     lumbar  . Vitamin D deficiency      Past Surgical History  Procedure Laterality Date  . Tonsillectomy    . Adenoidectomy    . Cardiac catheterization  2010    At [facility]. Mild nonobstructive CAD  . Eye surgery    . Coronary angioplasty       Family History  Problem Relation Age of Onset  . Heart disease    . Cancer Sister   . Stroke Mother   . Heart disease Mother   . Heart attack Father   . Heart disease  Father   . Heart disease Brother      History   Social History  . Marital Status: Widowed    Spouse Name: N/A  . Number of Children: N/A  . Years of Education: N/A   Occupational History  . retired    Social History Main Topics  . Smoking status: Never Smoker   . Smokeless tobacco: Never Used  . Alcohol Use: No  . Drug Use: No  . Sexual Activity: Not Currently   Other Topics Concern  . Not on file   Social History Narrative      PHYSICAL EXAM   BP 130/70 mmHg  Pulse 80  Ht _0  (1.626 m)  Wt 210 lb 12.8 oz (95.618 kg)  BMI 36.17 kg/m2 Constitutional: She is oriented to person, place, and time. She appears well-developed and well-nourished. No distress.  HENT: No nasal discharge.  Head: Normocephalic and atraumatic.  Eyes: Pupils are equal and round. Right eye exhibits no discharge. Left eye exhibits no discharge.  Neck: Normal range of motion. Neck supple. No JVD present. No thyromegaly present.  Cardiovascular: Normal rate, irregular rhythm, normal heart sounds. Exam reveals no gallop and no friction rub. No murmur heard.  Pulmonary/Chest: Effort normal and breath sounds normal. No stridor. No respiratory distress. She has no wheezes. She has no rales. She exhibits no tenderness.  Abdominal: Soft. Bowel sounds are normal. She exhibits no distension. There is no tenderness. There is no rebound and no guarding.  Musculoskeletal: Normal range of motion. She exhibits +1 edema and no tenderness.  Neurological: She is alert and oriented to person, place, and time. Coordination normal.  Skin: Skin is warm and dry. No rash noted. She is not diaphoretic. No erythema. No pallor.  Psychiatric: She has a normal mood and affect. Her behavior is normal. Judgment and thought content normal.  Distal pulses are mildly diminished.   EKG: Atrial fibrillation with ventricular rate of 80 beats per minute. Possible old inferior infarct.  ASSESSMENT AND PLAN

## 2014-12-07 NOTE — Patient Instructions (Signed)
Your physician wants you to follow-up in: 6 months with Dr. Zannie Cove will receive a reminder letter in the mail two months in advance. If you don't receive a letter, please call our office to schedule the follow-up appointment.

## 2014-12-09 ENCOUNTER — Ambulatory Visit (INDEPENDENT_AMBULATORY_CARE_PROVIDER_SITE_OTHER): Payer: Medicare Other

## 2014-12-09 VITALS — BP 134/88 | HR 93 | Resp 18

## 2014-12-09 DIAGNOSIS — B351 Tinea unguium: Secondary | ICD-10-CM | POA: Diagnosis not present

## 2014-12-09 DIAGNOSIS — M79676 Pain in unspecified toe(s): Secondary | ICD-10-CM

## 2014-12-09 DIAGNOSIS — E1140 Type 2 diabetes mellitus with diabetic neuropathy, unspecified: Secondary | ICD-10-CM | POA: Diagnosis not present

## 2014-12-09 NOTE — Progress Notes (Signed)
   Subjective:    Patient ID: Rebekah Stewart, female    DOB: [birth year], 80 y.o.   MRN: [id number]  HPI I AM HERE TO GET MY TOENAILS TRIMMED UP AND TO HAVE MY FEET CHECKED AND I WENT TO THE DOCTOR DUE TO MY HEART WAS TIGHT AND THEY SENT ME TO [place] AND HAD AN APPT AT [place] ON Tuesday OF THIS WEEK    Review of Systems no new findings or systemic changes noted     Objective:   Physical Exam Neurovascular status is intact and unchanged pedal pulses are palpable DP +2 PT 1 over 4 patient does have keratoses pinch callus first MTP area right more so than left been managed with previous use of diabetic shoes. Mild digital contractures noted thick brittle [place] friable dystrophic nails 1 through 5 bilateral painful tender symptomatically mild digital contractures noted patient has profound loss of sensation on Semmes Weinstein to the forefoot digits and arch bilateral. There is normal plantar response DTRs not elicited. No open wounds no ulcers no secondary infections       Assessment & Plan:  Assessment diabetes with history peripheral neuropathy painful mycotic nails debrided 1 through 5 bilateral at this time the presence of dystrophy of nails and diabetes as well also this time still keratotic lesion of the first MTP right is debrided obtain authorization for new diabetic shoes some point near future patient benefit from new shoes and custom molded inlays to accommodate deformities and prevent keratoses and subsequent ulceration and recheck in 3 months for continued palliative nail care  Harriet Masson DPM

## 2014-12-13 DIAGNOSIS — R791 Abnormal coagulation profile: Secondary | ICD-10-CM | POA: Diagnosis not present

## 2014-12-13 NOTE — Assessment & Plan Note (Signed)
Blood pressure is well controlled on current medications. 

## 2014-12-13 NOTE — Assessment & Plan Note (Signed)
She appears to be euvolemic.

## 2014-12-13 NOTE — Assessment & Plan Note (Signed)
Ventricular rate is well controlled on carvedilol. She is on anticoagulation with warfarin with a target INR between 2-3.

## 2014-12-13 NOTE — Assessment & Plan Note (Signed)
The patient had previous cardiac catheterization in 2010 which showed mild nonobstructive coronary artery disease. Current chest pain is very atypical and does not seem to be cardiac. It is suggestive of a musculoskeletal etiology. She underwent a nuclear stress test in June 2015 which showed no evidence of ischemia with normal ejection fraction. Current EKG is unchanged from before and recent troponin was normal. Thus, I do not recommend further cardiac workup at the present time.

## 2014-12-21 ENCOUNTER — Ambulatory Visit: Payer: PRIVATE HEALTH INSURANCE | Admitting: Endocrinology

## 2014-12-28 DIAGNOSIS — S70261A Insect bite (nonvenomous), right hip, initial encounter: Secondary | ICD-10-CM | POA: Diagnosis not present

## 2014-12-28 DIAGNOSIS — Z6837 Body mass index (BMI) 37.0-37.9, adult: Secondary | ICD-10-CM | POA: Diagnosis not present

## 2014-12-28 DIAGNOSIS — R791 Abnormal coagulation profile: Secondary | ICD-10-CM | POA: Diagnosis not present

## 2014-12-28 DIAGNOSIS — E785 Hyperlipidemia, unspecified: Secondary | ICD-10-CM | POA: Diagnosis not present

## 2014-12-28 DIAGNOSIS — Z79899 Other long term (current) drug therapy: Secondary | ICD-10-CM | POA: Diagnosis not present

## 2014-12-28 DIAGNOSIS — I4891 Unspecified atrial fibrillation: Secondary | ICD-10-CM | POA: Diagnosis not present

## 2014-12-28 DIAGNOSIS — I10 Essential (primary) hypertension: Secondary | ICD-10-CM | POA: Diagnosis not present

## 2014-12-28 DIAGNOSIS — E559 Vitamin D deficiency, unspecified: Secondary | ICD-10-CM | POA: Diagnosis not present

## 2014-12-28 DIAGNOSIS — E1140 Type 2 diabetes mellitus with diabetic neuropathy, unspecified: Secondary | ICD-10-CM | POA: Diagnosis not present

## 2015-01-11 DIAGNOSIS — R791 Abnormal coagulation profile: Secondary | ICD-10-CM | POA: Diagnosis not present

## 2015-01-25 DIAGNOSIS — R791 Abnormal coagulation profile: Secondary | ICD-10-CM | POA: Diagnosis not present

## 2015-02-10 ENCOUNTER — Ambulatory Visit: Payer: Medicare Other | Admitting: Podiatrist

## 2015-02-17 ENCOUNTER — Encounter: Payer: Self-pay | Admitting: Podiatrist

## 2015-02-17 ENCOUNTER — Ambulatory Visit (INDEPENDENT_AMBULATORY_CARE_PROVIDER_SITE_OTHER): Payer: Medicare Other | Admitting: Podiatrist

## 2015-02-17 VITALS — BP 118/62 | HR 87 | Resp 18

## 2015-02-17 DIAGNOSIS — M722 Plantar fascial fibromatosis: Secondary | ICD-10-CM

## 2015-02-17 DIAGNOSIS — E1140 Type 2 diabetes mellitus with diabetic neuropathy, unspecified: Secondary | ICD-10-CM

## 2015-02-17 NOTE — Progress Notes (Signed)
Patient presents today for diabetic shoe measurement this was carried out by medical staff today.

## 2015-02-23 DIAGNOSIS — Z7901 Long term (current) use of anticoagulants: Secondary | ICD-10-CM | POA: Diagnosis not present

## 2015-03-04 DIAGNOSIS — S30860A Insect bite (nonvenomous) of lower back and pelvis, initial encounter: Secondary | ICD-10-CM | POA: Diagnosis not present

## 2015-03-04 DIAGNOSIS — Z6837 Body mass index (BMI) 37.0-37.9, adult: Secondary | ICD-10-CM | POA: Diagnosis not present

## 2015-03-05 DIAGNOSIS — S30860A Insect bite (nonvenomous) of lower back and pelvis, initial encounter: Secondary | ICD-10-CM | POA: Diagnosis not present

## 2015-03-07 ENCOUNTER — Ambulatory Visit: Payer: Medicare Other | Admitting: Podiatrist

## 2015-03-14 ENCOUNTER — Encounter: Payer: Self-pay | Admitting: Podiatry

## 2015-03-14 ENCOUNTER — Ambulatory Visit (INDEPENDENT_AMBULATORY_CARE_PROVIDER_SITE_OTHER): Payer: Medicare Other | Admitting: Podiatry

## 2015-03-14 VITALS — BP 106/67 | HR 88 | Resp 18

## 2015-03-14 DIAGNOSIS — B351 Tinea unguium: Secondary | ICD-10-CM | POA: Diagnosis not present

## 2015-03-14 DIAGNOSIS — E1140 Type 2 diabetes mellitus with diabetic neuropathy, unspecified: Secondary | ICD-10-CM

## 2015-03-14 DIAGNOSIS — M79676 Pain in unspecified toe(s): Secondary | ICD-10-CM

## 2015-03-14 NOTE — Progress Notes (Signed)
Patient ID: Rebekah Stewart, female   DOB: 28-Jan-1935, 80 y.o.   MRN: [id number] Complaint:  Visit Type: Patient returns to my office for continued preventative foot care services. Complaint: Patient states" my nails have grown long and thick and become painful to walk and wear shoes" Patient has been diagnosed with DM with neuropathy.She  presents for preventative foot care services. No changes to ROS  Podiatric Exam: Vascular: dorsalis pedis and posterior tibial pulses are palpable bilateral. Capillary return is immediate. Temperature gradient is WNL. Skin turgor WNL  Sensorium: Diminished Semmes Weinstein monofilament test.  Nail Exam: Pt has thick disfigured discolored nails with subungual debris noted bilateral entire nail hallux through fifth toenails Ulcer Exam: There is no evidence of ulcer or pre-ulcerative changes or infection. Orthopedic Exam: Muscle tone and strength are WNL. No limitations in general ROM. No crepitus or effusions noted. Foot type and digits show no abnormalities. Bony prominences are unremarkable. Skin: No Porokeratosis. No infection or ulcers  Diagnosis:  Tinea unguium, Pain in right toe, pain in left toes  Treatment & Plan Procedures and Treatment: Consent by patient was obtained for treatment procedures. The patient understood the discussion of treatment and procedures well. All questions were answered thoroughly reviewed. Debridement of mycotic and hypertrophic toenails, 1 through 5 bilateral and clearing of subungual debris. No ulceration, no infection noted.  Return Visit-Office Procedure: Patient instructed to return to the office for a follow up visit 3 months for continued evaluation and treatment.

## 2015-03-25 DIAGNOSIS — R791 Abnormal coagulation profile: Secondary | ICD-10-CM | POA: Diagnosis not present

## 2015-03-31 DIAGNOSIS — R791 Abnormal coagulation profile: Secondary | ICD-10-CM | POA: Diagnosis not present

## 2015-04-07 DIAGNOSIS — Z6836 Body mass index (BMI) 36.0-36.9, adult: Secondary | ICD-10-CM | POA: Diagnosis not present

## 2015-04-07 DIAGNOSIS — Z1231 Encounter for screening mammogram for malignant neoplasm of breast: Secondary | ICD-10-CM | POA: Diagnosis not present

## 2015-04-07 DIAGNOSIS — I10 Essential (primary) hypertension: Secondary | ICD-10-CM | POA: Diagnosis not present

## 2015-04-07 DIAGNOSIS — Z79899 Other long term (current) drug therapy: Secondary | ICD-10-CM | POA: Diagnosis not present

## 2015-04-07 DIAGNOSIS — E559 Vitamin D deficiency, unspecified: Secondary | ICD-10-CM | POA: Diagnosis not present

## 2015-04-07 DIAGNOSIS — E785 Hyperlipidemia, unspecified: Secondary | ICD-10-CM | POA: Diagnosis not present

## 2015-04-07 DIAGNOSIS — R791 Abnormal coagulation profile: Secondary | ICD-10-CM | POA: Diagnosis not present

## 2015-04-07 DIAGNOSIS — I4891 Unspecified atrial fibrillation: Secondary | ICD-10-CM | POA: Diagnosis not present

## 2015-04-07 DIAGNOSIS — E1140 Type 2 diabetes mellitus with diabetic neuropathy, unspecified: Secondary | ICD-10-CM | POA: Diagnosis not present

## 2015-04-11 ENCOUNTER — Other Ambulatory Visit: Payer: Medicare Other

## 2015-04-21 DIAGNOSIS — R791 Abnormal coagulation profile: Secondary | ICD-10-CM | POA: Diagnosis not present

## 2015-05-02 ENCOUNTER — Ambulatory Visit (INDEPENDENT_AMBULATORY_CARE_PROVIDER_SITE_OTHER): Payer: Medicare Other | Admitting: Podiatry

## 2015-05-02 ENCOUNTER — Encounter: Payer: Self-pay | Admitting: Podiatry

## 2015-05-02 ENCOUNTER — Ambulatory Visit (INDEPENDENT_AMBULATORY_CARE_PROVIDER_SITE_OTHER): Payer: Medicare Other

## 2015-05-02 VITALS — BP 123/78 | HR 85 | Resp 18

## 2015-05-02 DIAGNOSIS — I25118 Atherosclerotic heart disease of native coronary artery with other forms of angina pectoris: Secondary | ICD-10-CM | POA: Diagnosis not present

## 2015-05-02 DIAGNOSIS — M722 Plantar fascial fibromatosis: Secondary | ICD-10-CM | POA: Diagnosis not present

## 2015-05-02 DIAGNOSIS — E1140 Type 2 diabetes mellitus with diabetic neuropathy, unspecified: Secondary | ICD-10-CM

## 2015-05-02 NOTE — Progress Notes (Signed)
Subjective:     Patient ID: Rebekah Stewart, female   DOB: 11-Jan-1935, 80 y.o.   MRN: [id number]  HPI This patient presents to the office saying she is here to pick up her diabetic shoes.  She says her right heel became very painful last Thursday which is causing her to limp.  She says she was treated by Dr. Blenda Mounts with strapping which helped her get better.  He was hesitant to treat her with medicine and injections. She therefore requests only strapping on her right foot.   Review of Systems     Objective:   Physical Exam GENERAL APPEARANCE: Alert, conversant. Appropriately groomed. No acute distress.  VASCULAR: Pedal pulses palpable at 2/4 DP and PT bilateral.  Capillary refill time is immediate to all digits,  Proximal to distal cooling it warm to warm.  Digital hair growth is present bilateral  NEUROLOGIC: sensation is diminished  epicritically and protectively to 5.07 monofilament at 5/5 sites bilateral.  Light touch is intact bilateral, vibratory sensation intact bilateral, achilles tendon reflex is intact bilateral.  MUSCULOSKELETAL: acceptable muscle strength, tone and stability bilateral.  Intrinsic muscluature intact bilateral.  Rectus appearance of foot and digits noted bilateral. Severe palpable right heel pain.  DERMATOLOGIC: skin color, texture, and turgor are within normal limits.  No preulcerative lesions or ulcers  are seen, no interdigital maceration noted.  No open lesions present.  Digital nails are asymptomatic. No drainage noted.      Assessment:     Plantar fasciitis right foot Diabetes with neuropathy.     Plan:   ROV   Dispense diabetic shoes with insoles.  RTC 2 weeks if pain in right heel persists.

## 2015-05-02 NOTE — Progress Notes (Signed)
   Subjective:    Patient ID: Rebekah Stewart, female    DOB: [birth year], 80 y.o.   MRN: [id number]  HPI I AM HAVING SOME RIGHT HEEL PAIN AND IT HAS BEEN GOING ON SINCE LAST Thursday AND IS SORE AND TENDER AND I AM HERE TO GET MY DIABETIC SHOES    Review of Systems  All other systems reviewed and are negative.      Objective:   Physical Exam        Assessment & Plan:

## 2015-05-03 NOTE — Addendum Note (Signed)
Addended by: Cranford Mon R on: 05/03/2015 10:07 AM   Modules accepted: Medications

## 2015-05-05 DIAGNOSIS — R791 Abnormal coagulation profile: Secondary | ICD-10-CM | POA: Diagnosis not present

## 2015-05-09 ENCOUNTER — Ambulatory Visit (INDEPENDENT_AMBULATORY_CARE_PROVIDER_SITE_OTHER): Payer: Medicare Other | Admitting: Podiatry

## 2015-05-09 ENCOUNTER — Other Ambulatory Visit: Payer: Medicare Other

## 2015-05-09 ENCOUNTER — Encounter: Payer: Self-pay | Admitting: Podiatry

## 2015-05-09 VITALS — BP 116/73 | HR 77 | Resp 14

## 2015-05-09 DIAGNOSIS — E1140 Type 2 diabetes mellitus with diabetic neuropathy, unspecified: Secondary | ICD-10-CM

## 2015-05-09 DIAGNOSIS — I25118 Atherosclerotic heart disease of native coronary artery with other forms of angina pectoris: Secondary | ICD-10-CM | POA: Diagnosis not present

## 2015-05-09 DIAGNOSIS — M722 Plantar fascial fibromatosis: Secondary | ICD-10-CM | POA: Diagnosis not present

## 2015-05-09 NOTE — Progress Notes (Signed)
Subjective:     Patient ID: MC HOLLEN, female   DOB: 01-14-1935, 80 y.o.   MRN: [id number]  HPIThis patient returns for continued pain right heel.  She says she wore foot strapping for  One week and her foot is still painful but better.  She does not want to be treated with medicine or injection so she requests a second strapping.  She also received her new diabetic shoes but has yet to wear them since the strapping was applied.   Review of Systems     Objective:   Physical Exam GENERAL APPEARANCE: Alert, conversant. Appropriately groomed. No acute distress.  VASCULAR: Pedal pulses palpable at  [facility] and PT bilateral.  Capillary refill time is immediate to all digits,  Normal temperature gradient.  Digital hair growth is present bilateral  NEUROLOGIC: sensation is normal to 5.07 monofilament at 5/5 sites bilateral.  Light touch is intact bilateral, Muscle strength normal.  MUSCULOSKELETAL: acceptable muscle strength, tone and stability bilateral.  Intrinsic muscluature intact bilateral.  Rectus appearance of foot and digits noted bilateral.  Palpable pain right heel.  DERMATOLOGIC: skin color, texture, and turgor are within normal limits.  No preulcerative lesions or ulcers  are seen, no interdigital maceration noted.  No open lesions present.  Digital nails are asymptomatic. No drainage noted.      Assessment:     Plantat fasciitis  Peripheral neuropathy     Plan:     ROV  Strapping reapplied.  Wear diabetic shoes in 5 days after removing her strapping.

## 2015-05-10 DIAGNOSIS — H2511 Age-related nuclear cataract, right eye: Secondary | ICD-10-CM | POA: Diagnosis not present

## 2015-05-10 DIAGNOSIS — E119 Type 2 diabetes mellitus without complications: Secondary | ICD-10-CM | POA: Diagnosis not present

## 2015-05-10 DIAGNOSIS — H47212 Primary optic atrophy, left eye: Secondary | ICD-10-CM | POA: Diagnosis not present

## 2015-05-23 DIAGNOSIS — R791 Abnormal coagulation profile: Secondary | ICD-10-CM | POA: Diagnosis not present

## 2015-05-31 DIAGNOSIS — B079 Viral wart, unspecified: Secondary | ICD-10-CM | POA: Diagnosis not present

## 2015-05-31 DIAGNOSIS — L304 Erythema intertrigo: Secondary | ICD-10-CM | POA: Diagnosis not present

## 2015-05-31 DIAGNOSIS — L578 Other skin changes due to chronic exposure to nonionizing radiation: Secondary | ICD-10-CM | POA: Diagnosis not present

## 2015-05-31 DIAGNOSIS — L820 Inflamed seborrheic keratosis: Secondary | ICD-10-CM | POA: Diagnosis not present

## 2015-06-20 ENCOUNTER — Ambulatory Visit: Payer: Medicare Other | Admitting: Podiatry

## 2015-06-24 DIAGNOSIS — Z7901 Long term (current) use of anticoagulants: Secondary | ICD-10-CM | POA: Diagnosis not present

## 2015-06-27 ENCOUNTER — Ambulatory Visit (INDEPENDENT_AMBULATORY_CARE_PROVIDER_SITE_OTHER): Payer: Medicare Other | Admitting: Podiatry

## 2015-06-27 ENCOUNTER — Encounter: Payer: Self-pay | Admitting: Podiatry

## 2015-06-27 DIAGNOSIS — B351 Tinea unguium: Secondary | ICD-10-CM

## 2015-06-27 DIAGNOSIS — M79676 Pain in unspecified toe(s): Secondary | ICD-10-CM | POA: Diagnosis not present

## 2015-06-27 DIAGNOSIS — E1140 Type 2 diabetes mellitus with diabetic neuropathy, unspecified: Secondary | ICD-10-CM | POA: Diagnosis not present

## 2015-06-27 NOTE — Progress Notes (Signed)
Patient ID: Rebekah Stewart, female   DOB: 10-21-1934, 80 y.o.   MRN: [id number] Complaint:  Visit Type: Patient returns to my office for continued preventative foot care services. Complaint: Patient states" my nails have grown long and thick and become painful to walk and wear shoes" Patient has been diagnosed with DM with neuropathy.She  presents for preventative foot care services. No changes to ROS  Podiatric Exam: Vascular: dorsalis pedis and posterior tibial pulses are palpable bilateral. Capillary return is immediate. Temperature gradient is WNL. Skin turgor WNL  Sensorium: Diminished Semmes Weinstein monofilament test.  Nail Exam: Pt has thick disfigured discolored nails with subungual debris noted bilateral entire nail hallux through fifth toenails Ulcer Exam: There is no evidence of ulcer or pre-ulcerative changes or infection. Orthopedic Exam: Muscle tone and strength are WNL. No limitations in general ROM. No crepitus or effusions noted. Foot type and digits show no abnormalities. Bony prominences are unremarkable. Skin: No Porokeratosis. No infection or ulcers  Diagnosis:  Tinea unguium, Pain in right toe, pain in left toes  Treatment & Plan Procedures and Treatment: Consent by patient was obtained for treatment procedures. The patient understood the discussion of treatment and procedures well. All questions were answered thoroughly reviewed. Debridement of mycotic and hypertrophic toenails, 1 through 5 bilateral and clearing of subungual debris. No ulceration, no infection noted.  Return Visit-Office Procedure: Patient instructed to return to the office for a follow up visit 3 months for continued evaluation and treatment.

## 2015-07-12 DIAGNOSIS — E1140 Type 2 diabetes mellitus with diabetic neuropathy, unspecified: Secondary | ICD-10-CM | POA: Diagnosis not present

## 2015-07-12 DIAGNOSIS — Z9181 History of falling: Secondary | ICD-10-CM | POA: Diagnosis not present

## 2015-07-12 DIAGNOSIS — E785 Hyperlipidemia, unspecified: Secondary | ICD-10-CM | POA: Diagnosis not present

## 2015-07-12 DIAGNOSIS — E559 Vitamin D deficiency, unspecified: Secondary | ICD-10-CM | POA: Diagnosis not present

## 2015-07-12 DIAGNOSIS — Z6837 Body mass index (BMI) 37.0-37.9, adult: Secondary | ICD-10-CM | POA: Diagnosis not present

## 2015-07-12 DIAGNOSIS — I4891 Unspecified atrial fibrillation: Secondary | ICD-10-CM | POA: Diagnosis not present

## 2015-07-12 DIAGNOSIS — Z1389 Encounter for screening for other disorder: Secondary | ICD-10-CM | POA: Diagnosis not present

## 2015-07-12 DIAGNOSIS — I10 Essential (primary) hypertension: Secondary | ICD-10-CM | POA: Diagnosis not present

## 2015-07-12 DIAGNOSIS — Z1231 Encounter for screening mammogram for malignant neoplasm of breast: Secondary | ICD-10-CM | POA: Diagnosis not present

## 2015-07-12 DIAGNOSIS — Z79899 Other long term (current) drug therapy: Secondary | ICD-10-CM | POA: Diagnosis not present

## 2015-07-30 DIAGNOSIS — R05 Cough: Secondary | ICD-10-CM | POA: Diagnosis not present

## 2015-07-30 DIAGNOSIS — Z79899 Other long term (current) drug therapy: Secondary | ICD-10-CM | POA: Diagnosis not present

## 2015-07-30 DIAGNOSIS — Z7901 Long term (current) use of anticoagulants: Secondary | ICD-10-CM | POA: Diagnosis not present

## 2015-07-30 DIAGNOSIS — I509 Heart failure, unspecified: Secondary | ICD-10-CM | POA: Diagnosis not present

## 2015-07-30 DIAGNOSIS — T45515A Adverse effect of anticoagulants, initial encounter: Secondary | ICD-10-CM | POA: Diagnosis not present

## 2015-07-30 DIAGNOSIS — I10 Essential (primary) hypertension: Secondary | ICD-10-CM | POA: Diagnosis not present

## 2015-07-30 DIAGNOSIS — D689 Coagulation defect, unspecified: Secondary | ICD-10-CM | POA: Diagnosis not present

## 2015-07-30 DIAGNOSIS — I4891 Unspecified atrial fibrillation: Secondary | ICD-10-CM | POA: Diagnosis not present

## 2015-07-30 DIAGNOSIS — D6832 Hemorrhagic disorder due to extrinsic circulating anticoagulants: Secondary | ICD-10-CM | POA: Diagnosis not present

## 2015-07-30 DIAGNOSIS — E7800 Pure hypercholesterolemia, unspecified: Secondary | ICD-10-CM | POA: Diagnosis not present

## 2015-07-30 DIAGNOSIS — E119 Type 2 diabetes mellitus without complications: Secondary | ICD-10-CM | POA: Diagnosis not present

## 2015-07-30 DIAGNOSIS — J0110 Acute frontal sinusitis, unspecified: Secondary | ICD-10-CM | POA: Diagnosis not present

## 2015-08-01 DIAGNOSIS — R791 Abnormal coagulation profile: Secondary | ICD-10-CM | POA: Diagnosis not present

## 2015-08-08 DIAGNOSIS — R791 Abnormal coagulation profile: Secondary | ICD-10-CM | POA: Diagnosis not present

## 2015-08-15 DIAGNOSIS — R791 Abnormal coagulation profile: Secondary | ICD-10-CM | POA: Diagnosis not present

## 2015-08-22 DIAGNOSIS — R791 Abnormal coagulation profile: Secondary | ICD-10-CM | POA: Diagnosis not present

## 2015-08-31 DIAGNOSIS — R791 Abnormal coagulation profile: Secondary | ICD-10-CM | POA: Diagnosis not present

## 2015-09-09 DIAGNOSIS — Z7901 Long term (current) use of anticoagulants: Secondary | ICD-10-CM | POA: Diagnosis not present

## 2015-09-16 DIAGNOSIS — Z7901 Long term (current) use of anticoagulants: Secondary | ICD-10-CM | POA: Diagnosis not present

## 2015-10-10 ENCOUNTER — Ambulatory Visit: Payer: Medicare Other | Admitting: Podiatry

## 2015-10-14 DIAGNOSIS — R791 Abnormal coagulation profile: Secondary | ICD-10-CM | POA: Diagnosis not present

## 2015-10-17 ENCOUNTER — Ambulatory Visit: Payer: Medicare Other | Admitting: Podiatry

## 2015-10-21 DIAGNOSIS — Z7901 Long term (current) use of anticoagulants: Secondary | ICD-10-CM | POA: Diagnosis not present

## 2015-10-31 ENCOUNTER — Encounter: Payer: Self-pay | Admitting: Podiatry

## 2015-10-31 ENCOUNTER — Ambulatory Visit (INDEPENDENT_AMBULATORY_CARE_PROVIDER_SITE_OTHER): Payer: Medicare Other | Admitting: Podiatry

## 2015-10-31 DIAGNOSIS — B351 Tinea unguium: Secondary | ICD-10-CM

## 2015-10-31 DIAGNOSIS — M79676 Pain in unspecified toe(s): Secondary | ICD-10-CM

## 2015-10-31 DIAGNOSIS — E1140 Type 2 diabetes mellitus with diabetic neuropathy, unspecified: Secondary | ICD-10-CM | POA: Diagnosis not present

## 2015-10-31 NOTE — Progress Notes (Signed)
Patient ID: Rebekah Stewart, female   DOB: 10-21-1934, 81 y.o.   MRN: [id number] Complaint:  Visit Type: Patient returns to my office for continued preventative foot care services. Complaint: Patient states" my nails have grown long and thick and become painful to walk and wear shoes" Patient has been diagnosed with DM with neuropathy.She  presents for preventative foot care services. No changes to ROS  Podiatric Exam: Vascular: dorsalis pedis and posterior tibial pulses are palpable bilateral. Capillary return is immediate. Temperature gradient is WNL. Skin turgor WNL  Sensorium: Diminished Semmes Weinstein monofilament test.  Nail Exam: Pt has thick disfigured discolored nails with subungual debris noted bilateral entire nail hallux through fifth toenails Ulcer Exam: There is no evidence of ulcer or pre-ulcerative changes or infection. Orthopedic Exam: Muscle tone and strength are WNL. No limitations in general ROM. No crepitus or effusions noted. Foot type and digits show no abnormalities. Bony prominences are unremarkable. Skin: No Porokeratosis. No infection or ulcers  Diagnosis:  Tinea unguium, Pain in right toe, pain in left toes  Treatment & Plan Procedures and Treatment: Consent by patient was obtained for treatment procedures. The patient understood the discussion of treatment and procedures well. All questions were answered thoroughly reviewed. Debridement of mycotic and hypertrophic toenails, 1 through 5 bilateral and clearing of subungual debris. No ulceration, no infection noted.  Return Visit-Office Procedure: Patient instructed to return to the office for a follow up visit 3 months for continued evaluation and treatment.

## 2015-11-03 DIAGNOSIS — R791 Abnormal coagulation profile: Secondary | ICD-10-CM | POA: Diagnosis not present

## 2015-11-14 DIAGNOSIS — E669 Obesity, unspecified: Secondary | ICD-10-CM | POA: Diagnosis not present

## 2015-11-14 DIAGNOSIS — I4891 Unspecified atrial fibrillation: Secondary | ICD-10-CM | POA: Diagnosis not present

## 2015-11-14 DIAGNOSIS — E559 Vitamin D deficiency, unspecified: Secondary | ICD-10-CM | POA: Diagnosis not present

## 2015-11-14 DIAGNOSIS — E785 Hyperlipidemia, unspecified: Secondary | ICD-10-CM | POA: Diagnosis not present

## 2015-11-14 DIAGNOSIS — Z1231 Encounter for screening mammogram for malignant neoplasm of breast: Secondary | ICD-10-CM | POA: Diagnosis not present

## 2015-11-14 DIAGNOSIS — Z23 Encounter for immunization: Secondary | ICD-10-CM | POA: Diagnosis not present

## 2015-11-14 DIAGNOSIS — Z79899 Other long term (current) drug therapy: Secondary | ICD-10-CM | POA: Diagnosis not present

## 2015-11-14 DIAGNOSIS — E1140 Type 2 diabetes mellitus with diabetic neuropathy, unspecified: Secondary | ICD-10-CM | POA: Diagnosis not present

## 2015-11-14 DIAGNOSIS — Z6836 Body mass index (BMI) 36.0-36.9, adult: Secondary | ICD-10-CM | POA: Diagnosis not present

## 2015-11-14 DIAGNOSIS — I10 Essential (primary) hypertension: Secondary | ICD-10-CM | POA: Diagnosis not present

## 2015-11-17 DIAGNOSIS — R791 Abnormal coagulation profile: Secondary | ICD-10-CM | POA: Diagnosis not present

## 2015-11-24 DIAGNOSIS — R791 Abnormal coagulation profile: Secondary | ICD-10-CM | POA: Diagnosis not present

## 2015-12-08 DIAGNOSIS — R791 Abnormal coagulation profile: Secondary | ICD-10-CM | POA: Diagnosis not present

## 2015-12-15 DIAGNOSIS — R791 Abnormal coagulation profile: Secondary | ICD-10-CM | POA: Diagnosis not present

## 2015-12-29 DIAGNOSIS — R791 Abnormal coagulation profile: Secondary | ICD-10-CM | POA: Diagnosis not present

## 2016-01-05 DIAGNOSIS — R791 Abnormal coagulation profile: Secondary | ICD-10-CM | POA: Diagnosis not present

## 2016-01-12 DIAGNOSIS — R791 Abnormal coagulation profile: Secondary | ICD-10-CM | POA: Diagnosis not present

## 2016-01-20 DIAGNOSIS — R791 Abnormal coagulation profile: Secondary | ICD-10-CM | POA: Diagnosis not present

## 2016-01-27 DIAGNOSIS — R791 Abnormal coagulation profile: Secondary | ICD-10-CM | POA: Diagnosis not present

## 2016-01-30 ENCOUNTER — Ambulatory Visit (INDEPENDENT_AMBULATORY_CARE_PROVIDER_SITE_OTHER): Payer: Medicare Other

## 2016-01-30 ENCOUNTER — Ambulatory Visit (INDEPENDENT_AMBULATORY_CARE_PROVIDER_SITE_OTHER): Payer: Medicare Other | Admitting: Podiatry

## 2016-01-30 ENCOUNTER — Encounter: Payer: Self-pay | Admitting: Podiatry

## 2016-01-30 DIAGNOSIS — M25472 Effusion, left ankle: Secondary | ICD-10-CM

## 2016-01-30 DIAGNOSIS — E1140 Type 2 diabetes mellitus with diabetic neuropathy, unspecified: Secondary | ICD-10-CM

## 2016-01-30 DIAGNOSIS — M79676 Pain in unspecified toe(s): Secondary | ICD-10-CM

## 2016-01-30 DIAGNOSIS — B351 Tinea unguium: Secondary | ICD-10-CM | POA: Diagnosis not present

## 2016-01-30 DIAGNOSIS — R52 Pain, unspecified: Secondary | ICD-10-CM

## 2016-01-30 NOTE — Progress Notes (Addendum)
Patient ID: Rebekah Stewart, female   DOB: 07/07/1935, 81 y.o.   MRN: [id number] Complaint:  Visit Type: Patient returns to my office for continued preventative foot care services. Complaint: Patient states" my nails have grown long and thick and become painful to walk and wear shoes" Patient has been diagnosed with DM with neuropathy.She  presents for preventative foot care services. No changes to ROS.She relates having pain in her legs which cause her to limit her walking distance.  She also has pain and swelling on the outside left ankle .  No trauma noted.  Podiatric Exam: Vascular: dorsalis pedis and posterior tibial pulses are palpable bilateral. Capillary return is immediate. Temperature gradient is WNL. Skin turgor WNL  Sensorium: Diminished Semmes Weinstein monofilament test.  Nail Exam: Pt has thick disfigured discolored nails with subungual debris noted bilateral entire nail hallux through fifth toenails Ulcer Exam: There is no evidence of ulcer or pre-ulcerative changes or infection. Orthopedic Exam: Muscle tone and strength are WNL. No limitations in general ROM. No crepitus or effusions noted. Foot type and digits show no abnormalities. HAV  B/L.  Hammer toes Palpable pain and swelling on the outside left ankle.  Pes planus B/L Skin: No Porokeratosis. No infection or ulcers  Diagnosis:  Tinea unguium, Pain in right toe, pain in left toes  Treatment & Plan Procedures and Treatment: Consent by patient was obtained for treatment procedures. The patient understood the discussion of treatment and procedures well. All questions were answered thoroughly reviewed. Debridement of mycotic and hypertrophic toenails, 1 through 5 bilateral and clearing of subungual debris. No ulceration, no infection noted. After hearing her description of her leg pain. I told her she would benefit from a vascular study since it sounds like ischemic problems.  To check on diabetic shoes for this patient. Ankle x-rays  reveal no bony pathology. Return Visit-Office Procedure: Patient instructed to return to the office for a follow up visit 3 months for continued evaluation and treatment.   Gardiner Barefoot DPM

## 2016-02-06 ENCOUNTER — Telehealth: Payer: Self-pay | Admitting: *Deleted

## 2016-02-06 NOTE — Telephone Encounter (Signed)
Patient called office stating that she was in last week to see Dr Prudence Davidson and discuss shoes he stated that he was going to check something about being eligible and let me know.  I have not heard anything please call me back

## 2016-02-10 DIAGNOSIS — R791 Abnormal coagulation profile: Secondary | ICD-10-CM | POA: Diagnosis not present

## 2016-02-13 DIAGNOSIS — I4891 Unspecified atrial fibrillation: Secondary | ICD-10-CM | POA: Diagnosis not present

## 2016-02-13 DIAGNOSIS — I10 Essential (primary) hypertension: Secondary | ICD-10-CM | POA: Diagnosis not present

## 2016-02-13 DIAGNOSIS — E785 Hyperlipidemia, unspecified: Secondary | ICD-10-CM | POA: Diagnosis not present

## 2016-02-13 DIAGNOSIS — E559 Vitamin D deficiency, unspecified: Secondary | ICD-10-CM | POA: Diagnosis not present

## 2016-02-13 DIAGNOSIS — Z79899 Other long term (current) drug therapy: Secondary | ICD-10-CM | POA: Diagnosis not present

## 2016-02-13 DIAGNOSIS — Z1231 Encounter for screening mammogram for malignant neoplasm of breast: Secondary | ICD-10-CM | POA: Diagnosis not present

## 2016-02-13 DIAGNOSIS — S50869A Insect bite (nonvenomous) of unspecified forearm, initial encounter: Secondary | ICD-10-CM | POA: Diagnosis not present

## 2016-02-13 DIAGNOSIS — Z6837 Body mass index (BMI) 37.0-37.9, adult: Secondary | ICD-10-CM | POA: Diagnosis not present

## 2016-02-13 DIAGNOSIS — E1140 Type 2 diabetes mellitus with diabetic neuropathy, unspecified: Secondary | ICD-10-CM | POA: Diagnosis not present

## 2016-02-23 DIAGNOSIS — R791 Abnormal coagulation profile: Secondary | ICD-10-CM | POA: Diagnosis not present

## 2016-03-26 DIAGNOSIS — Z7901 Long term (current) use of anticoagulants: Secondary | ICD-10-CM | POA: Diagnosis not present

## 2016-04-02 DIAGNOSIS — R791 Abnormal coagulation profile: Secondary | ICD-10-CM | POA: Diagnosis not present

## 2016-04-10 ENCOUNTER — Ambulatory Visit (INDEPENDENT_AMBULATORY_CARE_PROVIDER_SITE_OTHER): Payer: Medicare Other | Admitting: Cardiovascular Disease

## 2016-04-10 ENCOUNTER — Encounter: Payer: Self-pay | Admitting: Cardiovascular Disease

## 2016-04-10 VITALS — BP 151/90 | HR 106 | Ht 64.0 in | Wt 211.2 lb

## 2016-04-10 DIAGNOSIS — I482 Chronic atrial fibrillation, unspecified: Secondary | ICD-10-CM

## 2016-04-10 DIAGNOSIS — I10 Essential (primary) hypertension: Secondary | ICD-10-CM

## 2016-04-10 MED ORDER — DILTIAZEM HCL ER COATED BEADS 120 MG PO CP24: 120.0000 mg | ORAL_CAPSULE | Freq: Every day | ORAL | Status: DC

## 2016-04-10 NOTE — Patient Instructions (Addendum)
Medication Instructions:  STOP Amlodipine and Aspirin and START Diltiazem 120 mg daily  Labwork: NONE  Testing/Procedures: NONE  Follow-Up: Your physician wants you to follow-up in: 6 Months. receive a reminder letter in the mail two months in advance. If you don't receive a letter, please call our office to schedule the follow-up appointment.   Any Other Special Instructions Will Be Listed Below (If Applicable).   If you need a refill on your cardiac medications before your next appointment, please call your pharmacy.

## 2016-04-10 NOTE — Progress Notes (Signed)
Cardiology Office Note   Date:  04/10/2016   ID:  Rebekah Stewart, DOB [birth year], MRN [id number]  PCP:  Nicoletta Dress, MD  Cardiologist:   Kathlyn Sacramento, MD   Chief Complaint  Patient presents with  . Follow-up    SOB;w/ exertion lightheaded/dizziness;occasionally. edema; ankles and legs      History of Present Illness: Rebekah Stewart is a 81 y.o. female who presents for  a followup visit regarding chronic atrial fibrillation, refractory hypertension and diastolic heart failure. She had a cardiac catheterization done in 2010 which showed mild to moderate three-vessel nonobstructive coronary artery disease. Worst stenosis was in the mid LAD which was estimated to be 50%. She has chronic lower extremity edema which has not worsened. She has multiple chronic medical conditions including Diabetes and hyperlipidemia. Most recent nuclear stress test in June 2015 was normal.  She has chronic atypical chest pain thought to be musculoskeletal in nature. Overall, she has been doing reasonably well with no worsening dyspnea. She continues to have the same kind of chest pain. She complains of increased dizziness and some palpitations. Ventricular rate is noted to be slightly high today. She has been taking all her medications regularly including carvedilol 25 mg twice daily for rate control. She continues to be an anticoagulation on warfarin.   Past Medical History  Diagnosis Date  . UTI (lower urinary tract infection)   . Swelling of limb   . Varicose veins of lower extremities with other complications   . Osteoarthrosis, unspecified whether generalized or localized, unspecified site   . Lumbosacral spondylosis without myelopathy   . Hemorrhoids   . DM2 (diabetes mellitus, type 2) ([place])   . Colonic polyp   . Degeneration of lumbar or lumbosacral intervertebral disc   . Diverticulosis   . HLD (hyperlipidemia)   . Pain in thoracic spine   . Atrial fibrillation ([place]) 2010  . HTN  (hypertension)     diagnosed when she was in her 73s. Refractory. No RAS by [place] in 2010  . Coronary artery disease     mild non obstructive disease per cardiac cath in 2010  . Congestive heart failure, unspecified   . Chronic diastolic heart failure ([place])   . Atrial fibrillation ([place])   . Compression fracture of thoracic vertebra ([facility])   . Atrial fibrillation ([place])   . Long-term (current) use of anticoagulants   . Arthritis of back     lumbar  . Vitamin D deficiency     Past Surgical History  Procedure Laterality Date  . Tonsillectomy    . Adenoidectomy    . Cardiac catheterization  2010    At [place]. Mild nonobstructive CAD  . Eye surgery    . Coronary angioplasty       Current Outpatient Prescriptions  Medication Sig Dispense Refill  . amLODipine (NORVASC) 5 MG tablet Take 5 mg by mouth daily. Takes 1/2 to 1 tablet daily    . aspirin 81 MG EC tablet Take 81 mg by mouth daily.      Marland Kitchen atorvastatin (LIPITOR) 40 MG tablet Take 40 mg by mouth daily.    . benazepril (LOTENSIN) 20 MG tablet Take 20 mg by mouth daily.     . carvedilol (COREG) 25 MG tablet Take 25 mg by mouth 2 (two) times daily with a meal.    . Cholecalciferol (VITAMIN D) 2000 UNITS CAPS Take 1 capsule by mouth daily.    . fish oil-omega-3 fatty acids  1000 MG capsule Take 2 g by mouth 2 (two) times daily.     . furosemide (LASIX) 40 MG tablet Take 20 mg by mouth daily.     Marland Kitchen glucose blood (ACCU-CHEK AVIVA PLUS) test strip Check two times daily. Dx code E11.65 100 each 1  . isosorbide mononitrate (IMDUR) 60 MG 24 hr tablet Take 60 mg by mouth daily.      . metFORMIN (GLUCOPHAGE) 500 MG tablet Take 1 tablet (500 mg total) by mouth 3 (three) times daily. (Patient taking differently: Take 500 mg by mouth 2 (two) times daily with a meal. ) 270 tablet 1  . spironolactone (ALDACTONE) 25 MG tablet Take 25 mg by mouth daily.  3  . warfarin (COUMADIN) 5 MG tablet TAKE 5 MGS EVERY DAY & 7.5 MGS EVERY SUNDAY  0  .  spironolactone (ALDACTONE) 25 MG tablet Take 1 tablet (25 mg total) by mouth daily. 30 tablet 6   No current facility-administered medications for this visit.    Allergies:   Bee venom; Iodinated diagnostic agents; Penicillins; and Tape    Social History:  The patient  reports that she has never smoked. She has never used smokeless tobacco. She reports that she does not drink alcohol or use illicit drugs.   Family History:  The patient's family history includes Cancer in her sister; Heart attack in her father; Heart disease in her brother, father, and mother; Stroke in her mother.    ROS:  Please see the history of present illness.   Otherwise, review of systems are positive for none.   All other systems are reviewed and negative.    PHYSICAL EXAM: VS:  BP 151/90 mmHg  Pulse 106  Ht _0  (1.626 m)  Wt 211 lb 3.2 oz (95.8 kg)  BMI 36.23 kg/m2 , BMI Body mass index is 36.23 kg/(m^2). GEN: Well nourished, well developed, in no acute distress HEENT: normal Neck: no JVD, carotid bruits, or masses Cardiac: Irregularly irregular and mildly tachycardic; no murmurs, rubs, or gallops,no edema  Respiratory:  clear to auscultation bilaterally, normal work of breathing GI: soft, nontender, nondistended, + BS MS: no deformity or atrophy Skin: warm and dry, no rash Neuro:  Strength and sensation are intact Psych: euthymic mood, full affect   EKG:  EKG is ordered today. The ekg ordered today demonstrates atrial fibrillation with ventricular rate 10 6 bpm. Nonspecific ST and T wave changes.   Recent Labs: No results found for requested labs within last 365 days.    Lipid Panel    Component Value Date/Time   LDLCALC 75 03/12/2014      Wt Readings from Last 3 Encounters:  04/10/16 211 lb 3.2 oz (95.8 kg)  12/07/14 210 lb 12.8 oz (95.618 kg)  09/21/14 211 lb 6.4 oz (95.89 kg)        ASSESSMENT AND PLAN:  1.  Chronic atrial fibrillation: Symptoms of dizziness and palpitations  might be related to suboptimal rate control. She is already on maximal dose carvedilol 25 mg twice daily. Thus, I elected to switch amlodipine to diltiazem extended release 120 mg once daily.  2. Essential hypertension: She is mildly hypertensive but appears to be anxious. I made the changes in her medications as outlined above.  3. Coronary artery disease involving native coronary arteries without angina: She has no clear anginal symptoms. Chronic chest pain seems to be musculoskeletal. Given that she is on anticoagulation with warfarin, I elected to discontinue aspirin to minimize the risk of  bleeding. Most recent stress test was in 2015 and was normal.  4. Hyperlipidemia: Continue treatment with atorvastatin. I reviewed her recent labs showed a cholesterol of 150, triglycerides 89, HDL of 61 and LDL of 71.    Disposition:   FU with me in 6 months  Signed,  Kathlyn Sacramento, MD  04/10/2016 10:38 AM    [place]

## 2016-04-11 ENCOUNTER — Ambulatory Visit: Payer: Medicare Other

## 2016-04-13 DIAGNOSIS — Z6837 Body mass index (BMI) 37.0-37.9, adult: Secondary | ICD-10-CM | POA: Diagnosis not present

## 2016-04-13 DIAGNOSIS — N390 Urinary tract infection, site not specified: Secondary | ICD-10-CM | POA: Diagnosis not present

## 2016-04-16 DIAGNOSIS — R791 Abnormal coagulation profile: Secondary | ICD-10-CM | POA: Diagnosis not present

## 2016-04-20 DIAGNOSIS — R791 Abnormal coagulation profile: Secondary | ICD-10-CM | POA: Diagnosis not present

## 2016-04-25 DIAGNOSIS — R791 Abnormal coagulation profile: Secondary | ICD-10-CM | POA: Diagnosis not present

## 2016-04-30 ENCOUNTER — Ambulatory Visit: Payer: Medicare Other | Admitting: Podiatry

## 2016-05-02 ENCOUNTER — Encounter: Payer: Self-pay | Admitting: Sports Medicine

## 2016-05-02 ENCOUNTER — Ambulatory Visit (INDEPENDENT_AMBULATORY_CARE_PROVIDER_SITE_OTHER): Payer: Medicare Other | Admitting: Sports Medicine

## 2016-05-02 DIAGNOSIS — E1140 Type 2 diabetes mellitus with diabetic neuropathy, unspecified: Secondary | ICD-10-CM | POA: Diagnosis not present

## 2016-05-02 DIAGNOSIS — B351 Tinea unguium: Secondary | ICD-10-CM

## 2016-05-02 DIAGNOSIS — M79676 Pain in unspecified toe(s): Secondary | ICD-10-CM

## 2016-05-02 DIAGNOSIS — M19072 Primary osteoarthritis, left ankle and foot: Secondary | ICD-10-CM

## 2016-05-02 DIAGNOSIS — R791 Abnormal coagulation profile: Secondary | ICD-10-CM | POA: Diagnosis not present

## 2016-05-02 DIAGNOSIS — M25472 Effusion, left ankle: Secondary | ICD-10-CM

## 2016-05-02 NOTE — Progress Notes (Signed)
Subjective: Rebekah Stewart is a 81 y.o. female patient with history of diabetes who presents to office today complaining of long, painful nails  while ambulating in shoes; unable to trim. Patient states that the glucose reading this morning was not recorded.. Patient denies any new changes in medication or new problems. States that her left ankle still hurts, as before, when she was seen 3 months ago by Dr. Prudence Davidson. Reports that she desires me to look at the x-ray, she had last visit to determine if something needs to be done for her left ankle. Denies trip injury or fall. Patient denies any new cramping, numbness, burning or tingling in the legs.  Patient Active Problem List   Diagnosis Date Noted  . Pure hypercholesterolemia 05/21/2014  . Bilateral leg pain 11/26/2012  . Chronic diastolic heart failure ([place])   . Atrial fibrillation ([place])   . HTN (hypertension)   . Coronary artery disease    Current Outpatient Prescriptions on File Prior to Visit  Medication Sig Dispense Refill  . atorvastatin (LIPITOR) 40 MG tablet Take 40 mg by mouth daily.    . benazepril (LOTENSIN) 20 MG tablet Take 20 mg by mouth daily.     . carvedilol (COREG) 25 MG tablet Take 25 mg by mouth 2 (two) times daily with a meal.    . Cholecalciferol (VITAMIN D) 2000 UNITS CAPS Take 1 capsule by mouth daily.    Marland Kitchen diltiazem (CARDIZEM CD) 120 MG 24 hr capsule Take 1 capsule (120 mg total) by mouth daily. 90 capsule 3  . fish oil-omega-3 fatty acids 1000 MG capsule Take 2 g by mouth 2 (two) times daily.     . furosemide (LASIX) 40 MG tablet Take 20 mg by mouth daily.     Marland Kitchen glucose blood (ACCU-CHEK AVIVA PLUS) test strip Check two times daily. Dx code E11.65 100 each 1  . isosorbide mononitrate (IMDUR) 60 MG 24 hr tablet Take 60 mg by mouth daily.      . metFORMIN (GLUCOPHAGE) 500 MG tablet Take 1 tablet (500 mg total) by mouth 3 (three) times daily. (Patient taking differently: Take 500 mg by mouth 2 (two) times daily with a meal.  ) 270 tablet 1  . spironolactone (ALDACTONE) 25 MG tablet Take 1 tablet (25 mg total) by mouth daily. 30 tablet 6  . spironolactone (ALDACTONE) 25 MG tablet Take 25 mg by mouth daily.  3  . warfarin (COUMADIN) 5 MG tablet TAKE 5 MGS EVERY DAY & 7.5 MGS EVERY SUNDAY  0   No current facility-administered medications on file prior to visit.    Allergies  Allergen Reactions  . Bee Venom Shortness Of Breath and Swelling  . Iodinated Diagnostic Agents Itching  . Penicillins Swelling  . Tape     Pulls the skin    No results found for this or any previous visit (from the past 2160 hour(s)).  Objective: General: Patient is awake, alert, and oriented x 3 and in no acute distress.  Integument: Skin is warm, dry and supple bilateral. Nails are tender, long, thickened and dystrophic with subungual debris, consistent with onychomycosis, 1-5 bilateral. No signs of infection. No open lesions or preulcerative lesions present bilateral. Remaining integument unremarkable.  Vasculature:  Dorsalis Pedis pulse 1/4 bilateral. Posterior Tibial pulse  1/4 bilateral.  Capillary fill time <5 sec 1-5 bilateral. Diminished hair growth to the level of the digits. Temperature gradient within normal limits. Mild varicosities present bilateral. Trace edema present bilateral.   Neurology:  The patient has diminished sensation measured with a 5.07/10g Semmes Weinstein Monofilament at all pedal sites bilateral . Vibratory sensation diminished bilateral with tuning fork. No Babinski sign present bilateral.   Musculoskeletal: Mild tenderness to palpation left lateral ankle, lateral gutter sinus tarsi. No frank instability. Muscular strength 5/5 in all lower extremity muscular groups bilateral without pain on range of motion especially upon stressing the left ankle. No tenderness with calf compression bilateral. Asymptomatic bunion and hammertoes bilateral.  Assessment and Plan: Problem List Items Addressed This Visit     None    Visit Diagnoses    Type 2 diabetes, controlled, with neuropathy ([place])    -  Primary   Onychomycosis       Pain of toe, unspecified laterality       Ankle swelling, left       Likely PVD   Osteoarthritis of ankle and foot, left         -Examined patient. -Previous x-rays reviewed, no significant fracture or dislocation, findings suggestive of arthritis at area of concern at lateral ankle -Patient declined steroid injection. States that she will let me know if her ankle continues to bother her if she wants injection. Thus recommended to support ankle with compression sleeve which I dispensed to patient today  surgi-tube stockinette to offer some compression and support to left ankle -Discussed and educated patient on diabetic foot care, especially with regards to the vascular, neurological and musculoskeletal systems.  -Stressed the importance of good glycemic control and the detriment of not controlling glucose levels in relation to the foot. -Mechanically debrided all nails 1-5 bilateral using sterile nail nipper and filed with dremel without incident  -Answered all patient questions -Patient to return  in 3 months for at risk foot care -Patient advised to call the office if any problems or questions arise in the meantime.  Landis Martins, DPM

## 2016-05-09 DIAGNOSIS — R791 Abnormal coagulation profile: Secondary | ICD-10-CM | POA: Diagnosis not present

## 2016-05-14 DIAGNOSIS — Z7984 Long term (current) use of oral hypoglycemic drugs: Secondary | ICD-10-CM | POA: Diagnosis not present

## 2016-05-14 DIAGNOSIS — E119 Type 2 diabetes mellitus without complications: Secondary | ICD-10-CM | POA: Diagnosis not present

## 2016-05-14 DIAGNOSIS — I10 Essential (primary) hypertension: Secondary | ICD-10-CM | POA: Diagnosis not present

## 2016-05-14 DIAGNOSIS — H524 Presbyopia: Secondary | ICD-10-CM | POA: Diagnosis not present

## 2016-05-14 DIAGNOSIS — H43813 Vitreous degeneration, bilateral: Secondary | ICD-10-CM | POA: Diagnosis not present

## 2016-05-14 DIAGNOSIS — H43313 Vitreous membranes and strands, bilateral: Secondary | ICD-10-CM | POA: Diagnosis not present

## 2016-05-14 DIAGNOSIS — H25811 Combined forms of age-related cataract, right eye: Secondary | ICD-10-CM | POA: Diagnosis not present

## 2016-05-14 DIAGNOSIS — Z9842 Cataract extraction status, left eye: Secondary | ICD-10-CM | POA: Diagnosis not present

## 2016-05-14 DIAGNOSIS — H52223 Regular astigmatism, bilateral: Secondary | ICD-10-CM | POA: Diagnosis not present

## 2016-05-14 DIAGNOSIS — H43393 Other vitreous opacities, bilateral: Secondary | ICD-10-CM | POA: Diagnosis not present

## 2016-05-14 DIAGNOSIS — H5203 Hypermetropia, bilateral: Secondary | ICD-10-CM | POA: Diagnosis not present

## 2016-05-14 DIAGNOSIS — Z961 Presence of intraocular lens: Secondary | ICD-10-CM | POA: Diagnosis not present

## 2016-05-23 DIAGNOSIS — Z6836 Body mass index (BMI) 36.0-36.9, adult: Secondary | ICD-10-CM | POA: Diagnosis not present

## 2016-05-23 DIAGNOSIS — N390 Urinary tract infection, site not specified: Secondary | ICD-10-CM | POA: Diagnosis not present

## 2016-05-23 DIAGNOSIS — I4891 Unspecified atrial fibrillation: Secondary | ICD-10-CM | POA: Diagnosis not present

## 2016-05-23 DIAGNOSIS — E785 Hyperlipidemia, unspecified: Secondary | ICD-10-CM | POA: Diagnosis not present

## 2016-05-23 DIAGNOSIS — R791 Abnormal coagulation profile: Secondary | ICD-10-CM | POA: Diagnosis not present

## 2016-05-23 DIAGNOSIS — R1032 Left lower quadrant pain: Secondary | ICD-10-CM | POA: Diagnosis not present

## 2016-05-23 DIAGNOSIS — E1140 Type 2 diabetes mellitus with diabetic neuropathy, unspecified: Secondary | ICD-10-CM | POA: Diagnosis not present

## 2016-05-23 DIAGNOSIS — E538 Deficiency of other specified B group vitamins: Secondary | ICD-10-CM | POA: Diagnosis not present

## 2016-05-23 DIAGNOSIS — E669 Obesity, unspecified: Secondary | ICD-10-CM | POA: Diagnosis not present

## 2016-05-23 DIAGNOSIS — I10 Essential (primary) hypertension: Secondary | ICD-10-CM | POA: Diagnosis not present

## 2016-05-23 DIAGNOSIS — Z79899 Other long term (current) drug therapy: Secondary | ICD-10-CM | POA: Diagnosis not present

## 2016-05-23 DIAGNOSIS — E1165 Type 2 diabetes mellitus with hyperglycemia: Secondary | ICD-10-CM | POA: Diagnosis not present

## 2016-05-23 DIAGNOSIS — Z1231 Encounter for screening mammogram for malignant neoplasm of breast: Secondary | ICD-10-CM | POA: Diagnosis not present

## 2016-05-23 DIAGNOSIS — E559 Vitamin D deficiency, unspecified: Secondary | ICD-10-CM | POA: Diagnosis not present

## 2016-05-30 DIAGNOSIS — N390 Urinary tract infection, site not specified: Secondary | ICD-10-CM | POA: Diagnosis not present

## 2016-05-30 DIAGNOSIS — R791 Abnormal coagulation profile: Secondary | ICD-10-CM | POA: Diagnosis not present

## 2016-06-01 DIAGNOSIS — I2510 Atherosclerotic heart disease of native coronary artery without angina pectoris: Secondary | ICD-10-CM | POA: Diagnosis not present

## 2016-06-01 DIAGNOSIS — R1032 Left lower quadrant pain: Secondary | ICD-10-CM | POA: Diagnosis not present

## 2016-06-01 DIAGNOSIS — I700 Atherosclerosis of aorta: Secondary | ICD-10-CM | POA: Diagnosis not present

## 2016-06-01 DIAGNOSIS — K5730 Diverticulosis of large intestine without perforation or abscess without bleeding: Secondary | ICD-10-CM | POA: Diagnosis not present

## 2016-06-05 DIAGNOSIS — Z6837 Body mass index (BMI) 37.0-37.9, adult: Secondary | ICD-10-CM | POA: Diagnosis not present

## 2016-06-05 DIAGNOSIS — Z79899 Other long term (current) drug therapy: Secondary | ICD-10-CM | POA: Diagnosis not present

## 2016-06-05 DIAGNOSIS — M25552 Pain in left hip: Secondary | ICD-10-CM | POA: Diagnosis not present

## 2016-06-13 ENCOUNTER — Ambulatory Visit (INDEPENDENT_AMBULATORY_CARE_PROVIDER_SITE_OTHER): Payer: Medicare Other | Admitting: Sports Medicine

## 2016-06-13 DIAGNOSIS — M722 Plantar fascial fibromatosis: Secondary | ICD-10-CM

## 2016-06-13 DIAGNOSIS — M19072 Primary osteoarthritis, left ankle and foot: Secondary | ICD-10-CM | POA: Diagnosis not present

## 2016-06-13 DIAGNOSIS — R791 Abnormal coagulation profile: Secondary | ICD-10-CM | POA: Diagnosis not present

## 2016-06-13 DIAGNOSIS — E1140 Type 2 diabetes mellitus with diabetic neuropathy, unspecified: Secondary | ICD-10-CM

## 2016-06-22 DIAGNOSIS — L300 Nummular dermatitis: Secondary | ICD-10-CM | POA: Diagnosis not present

## 2016-07-04 NOTE — Progress Notes (Signed)
Patient presents for diabetic shoe pick up, shoes are tried on for good fit.  Patient received 1 Pair and 3 pairs custom molded diabetic inserts.  Verbal and written break in and wear instructions given.  Patient will follow up for scheduled routine care.   Patient discussed with medical assistant. Agree with above. Patient to follow up as scheduled for continued care or sooner if problems or issues arise. -Dr. Cannon Kettle

## 2016-07-12 DIAGNOSIS — L918 Other hypertrophic disorders of the skin: Secondary | ICD-10-CM | POA: Diagnosis not present

## 2016-07-12 DIAGNOSIS — L738 Other specified follicular disorders: Secondary | ICD-10-CM | POA: Diagnosis not present

## 2016-07-12 DIAGNOSIS — L820 Inflamed seborrheic keratosis: Secondary | ICD-10-CM | POA: Diagnosis not present

## 2016-07-12 DIAGNOSIS — L309 Dermatitis, unspecified: Secondary | ICD-10-CM | POA: Diagnosis not present

## 2016-07-12 DIAGNOSIS — L821 Other seborrheic keratosis: Secondary | ICD-10-CM | POA: Diagnosis not present

## 2016-07-13 DIAGNOSIS — Z7901 Long term (current) use of anticoagulants: Secondary | ICD-10-CM | POA: Diagnosis not present

## 2016-07-27 DIAGNOSIS — R791 Abnormal coagulation profile: Secondary | ICD-10-CM | POA: Diagnosis not present

## 2016-08-02 ENCOUNTER — Ambulatory Visit (INDEPENDENT_AMBULATORY_CARE_PROVIDER_SITE_OTHER): Payer: Medicare Other | Admitting: Sports Medicine

## 2016-08-02 ENCOUNTER — Encounter: Payer: Self-pay | Admitting: Sports Medicine

## 2016-08-02 DIAGNOSIS — B351 Tinea unguium: Secondary | ICD-10-CM

## 2016-08-02 DIAGNOSIS — E1140 Type 2 diabetes mellitus with diabetic neuropathy, unspecified: Secondary | ICD-10-CM | POA: Diagnosis not present

## 2016-08-02 DIAGNOSIS — M79676 Pain in unspecified toe(s): Secondary | ICD-10-CM

## 2016-08-02 DIAGNOSIS — L539 Erythematous condition, unspecified: Secondary | ICD-10-CM

## 2016-08-02 NOTE — Progress Notes (Signed)
Subjective: Rebekah Stewart is a 81 y.o. female patient with history of diabetes who returnsto office today complaining of long, painful nails  while ambulating in shoes; unable to trim. Patient states that the glucose reading this morning was not recorded. Patient states that she had a rash on her foot and waist that she saw dermatologist for and was given oral antibiotics that cost over $80 that she did not get because she could not afford it. Patient denies any new cramping, numbness, burning or tingling in the legs.  Patient Active Problem List   Diagnosis Date Noted  . Pure hypercholesterolemia 05/21/2014  . Bilateral leg pain 11/26/2012  . Chronic diastolic heart failure ([place])   . Atrial fibrillation ([place])   . HTN (hypertension)   . Coronary artery disease    Current Outpatient Prescriptions on File Prior to Visit  Medication Sig Dispense Refill  . atorvastatin (LIPITOR) 40 MG tablet Take 40 mg by mouth daily.    . benazepril (LOTENSIN) 20 MG tablet Take 20 mg by mouth daily.     . carvedilol (COREG) 25 MG tablet Take 25 mg by mouth 2 (two) times daily with a meal.    . Cholecalciferol (VITAMIN D) 2000 UNITS CAPS Take 1 capsule by mouth daily.    Marland Kitchen diltiazem (CARDIZEM CD) 120 MG 24 hr capsule Take 1 capsule (120 mg total) by mouth daily. 90 capsule 3  . fish oil-omega-3 fatty acids 1000 MG capsule Take 2 g by mouth 2 (two) times daily.     . furosemide (LASIX) 40 MG tablet Take 20 mg by mouth daily.     Marland Kitchen glucose blood (ACCU-CHEK AVIVA PLUS) test strip Check two times daily. Dx code E11.65 100 each 1  . isosorbide mononitrate (IMDUR) 60 MG 24 hr tablet Take 60 mg by mouth daily.      . metFORMIN (GLUCOPHAGE) 500 MG tablet Take 1 tablet (500 mg total) by mouth 3 (three) times daily. (Patient taking differently: Take 500 mg by mouth 2 (two) times daily with a meal. ) 270 tablet 1  . spironolactone (ALDACTONE) 25 MG tablet Take 1 tablet (25 mg total) by mouth daily. 30 tablet 6  .  spironolactone (ALDACTONE) 25 MG tablet Take 25 mg by mouth daily.  3  . warfarin (COUMADIN) 5 MG tablet TAKE 5 MGS EVERY DAY & 7.5 MGS EVERY SUNDAY  0   No current facility-administered medications on file prior to visit.    Allergies  Allergen Reactions  . Bee Venom Shortness Of Breath and Swelling  . Iodinated Diagnostic Agents Itching  . Penicillins Swelling  . Tape     Pulls the skin    No results found for this or any previous visit (from the past 2160 hour(s)).  Objective: General: Patient is awake, alert, and oriented x 3 and in no acute distress.  Integument: Skin is warm, dry and supple bilateral. Nails are tender, long, thickened and dystrophic with subungual debris, consistent with onychomycosis, 1-5 bilateral. Blanchable erythema to right hallux with small lesions proximal that look like insect bites even though patient denies. No acute signs of infection. No open lesions or preulcerative lesions present bilateral. Remaining integument unremarkable.  Vasculature:  Dorsalis Pedis pulse 1/4 bilateral. Posterior Tibial pulse  1/4 bilateral.  Capillary fill time <5 sec 1-5 bilateral. Diminished hair growth to the level of the digits. Temperature gradient within normal limits. Mild varicosities present bilateral. Trace edema present bilateral.   Neurology: The patient has diminished sensation  measured with a 5.07/10g Semmes Weinstein Monofilament at all pedal sites bilateral . Vibratory sensation diminished bilateral with tuning fork. No Babinski sign present bilateral.   Musculoskeletal: No tenderness bilateral. Muscular strength 5/5 in all lower extremity muscular groups bilateral without pain on range of motion. No tenderness with calf compression bilateral. Asymptomatic bunion and hammertoes bilateral.  Assessment and Plan: Problem List Items Addressed This Visit    None    Visit Diagnoses    Dermatophytosis of nail    -  Primary   Pain of toe, unspecified laterality        Type 2 diabetes, controlled, with neuropathy ([facility])       Erythema of toe       Right hallux     -Examined patient. -Gave anitbiotic cream and recommend soaks for right foot if erythema not better in 1-2 weeks return to office -Gave  surgi-tube stockinette to offer some compression to both legs for swelling -Discussed and educated patient on diabetic foot care, especially with regards to the vascular, neurological and musculoskeletal systems.  -Stressed the importance of good glycemic control and the detriment of not controlling glucose levels in relation to the foot. -Mechanically debrided all nails 1-5 bilateral using sterile nail nipper and filed with dremel without incident  -Answered all patient questions -Patient to return  in 3 months for at risk foot care -Patient advised to call the office if any problems or questions arise in the meantime.  Landis Martins, DPM

## 2016-08-02 NOTE — Patient Instructions (Signed)
Apply antibiotic cream and soak with 1 cup of vinegar to 8 cups of warm water. If not better in a week or two call office for a sooner appointment

## 2016-08-03 DIAGNOSIS — R791 Abnormal coagulation profile: Secondary | ICD-10-CM | POA: Diagnosis not present

## 2016-08-17 DIAGNOSIS — R791 Abnormal coagulation profile: Secondary | ICD-10-CM | POA: Diagnosis not present

## 2016-08-22 DIAGNOSIS — E1140 Type 2 diabetes mellitus with diabetic neuropathy, unspecified: Secondary | ICD-10-CM | POA: Diagnosis not present

## 2016-08-22 DIAGNOSIS — Z79899 Other long term (current) drug therapy: Secondary | ICD-10-CM | POA: Diagnosis not present

## 2016-08-22 DIAGNOSIS — Z1389 Encounter for screening for other disorder: Secondary | ICD-10-CM | POA: Diagnosis not present

## 2016-08-22 DIAGNOSIS — E785 Hyperlipidemia, unspecified: Secondary | ICD-10-CM | POA: Diagnosis not present

## 2016-08-22 DIAGNOSIS — I4891 Unspecified atrial fibrillation: Secondary | ICD-10-CM | POA: Diagnosis not present

## 2016-08-22 DIAGNOSIS — I110 Hypertensive heart disease with heart failure: Secondary | ICD-10-CM | POA: Diagnosis not present

## 2016-08-22 DIAGNOSIS — Z6838 Body mass index (BMI) 38.0-38.9, adult: Secondary | ICD-10-CM | POA: Diagnosis not present

## 2016-08-22 DIAGNOSIS — Z9181 History of falling: Secondary | ICD-10-CM | POA: Diagnosis not present

## 2016-08-22 DIAGNOSIS — E559 Vitamin D deficiency, unspecified: Secondary | ICD-10-CM | POA: Diagnosis not present

## 2016-08-29 DIAGNOSIS — Z1231 Encounter for screening mammogram for malignant neoplasm of breast: Secondary | ICD-10-CM | POA: Diagnosis not present

## 2016-08-31 DIAGNOSIS — R791 Abnormal coagulation profile: Secondary | ICD-10-CM | POA: Diagnosis not present

## 2016-09-17 DIAGNOSIS — R791 Abnormal coagulation profile: Secondary | ICD-10-CM | POA: Diagnosis not present

## 2016-09-21 DIAGNOSIS — R791 Abnormal coagulation profile: Secondary | ICD-10-CM | POA: Diagnosis not present

## 2016-09-28 DIAGNOSIS — R791 Abnormal coagulation profile: Secondary | ICD-10-CM | POA: Diagnosis not present

## 2016-10-02 ENCOUNTER — Ambulatory Visit: Payer: Medicare Other | Admitting: Physician Assistant

## 2016-10-03 DIAGNOSIS — M25551 Pain in right hip: Secondary | ICD-10-CM | POA: Diagnosis not present

## 2016-10-03 DIAGNOSIS — R1031 Right lower quadrant pain: Secondary | ICD-10-CM | POA: Diagnosis not present

## 2016-10-10 ENCOUNTER — Telehealth: Payer: Self-pay | Admitting: Cardiovascular Disease

## 2016-10-10 DIAGNOSIS — R600 Localized edema: Secondary | ICD-10-CM

## 2016-10-10 DIAGNOSIS — L97921 Non-pressure chronic ulcer of unspecified part of left lower leg limited to breakdown of skin: Secondary | ICD-10-CM | POA: Diagnosis not present

## 2016-10-10 DIAGNOSIS — M79605 Pain in left leg: Principal | ICD-10-CM

## 2016-10-10 DIAGNOSIS — M79604 Pain in right leg: Secondary | ICD-10-CM

## 2016-10-10 DIAGNOSIS — L97911 Non-pressure chronic ulcer of unspecified part of right lower leg limited to breakdown of skin: Secondary | ICD-10-CM | POA: Diagnosis not present

## 2016-10-10 DIAGNOSIS — I8310 Varicose veins of unspecified lower extremity with inflammation: Secondary | ICD-10-CM | POA: Diagnosis not present

## 2016-10-10 NOTE — Telephone Encounter (Signed)
Pt went to Dermatologist today,she has Edema. He wants her to check with Dr Fletcher Anon to see if she can stop taking her Amlodipine. He said that it was making the Edema worse.She had so much pain in her leg and back,she wants to know what type of doctor can she go for this?

## 2016-10-10 NOTE — Telephone Encounter (Signed)
Returned call to patient-patient with multiple issues:  Pt states she has been having L leg pain for months but in the last 3 weeks has developed right leg pain radiating to her right buttock and down her leg, also reports varicose veins and concern for a vascular issue.  Denies numbness/tingling, decreased sensation.  Pt reports she has had xrays completed by podiatrist but they were inconclusive and pt is frustrated because they have not followed up and completed further testing or referred her to a vascular doctor to find out what is causing her pain (therefore requesting Dr. Fletcher Anon advice).    Patient also states she went to the dermatologist today with a rash to BL legs and her dermologist noted LE edema and advised her to come off of the amlodipine.  Her doctor believes this medication is causing her LE swelling.     Per OV note on 04/10/16-Dr. Arida recommended: Medication Instructions:  STOP Amlodipine and Aspirin and START Diltiazem 120 mg daily   Pt reports she went to get new rx and couldn't afford it. Reports PCP advised her to continue amlodipine and ASA, therefore did not make the medication change.  Patient currently still taking amlodipine and ASA.  Denies monitoring BP or HR at home.  Patient also reports BL LE swelling, states "its always there, it comes and goes".  Denies weighing herself at home.  Reports compliance with lasix and aldactone.  Denies SOB or CP.    Advised to elevate legs above level of heart, monitor BP and HR as well as weights at home if possible.  Educated on importance of this.  Pt verbalized understanding.    Advised I would send Dr. Fletcher Anon a message to make him aware of situation and for further recommendations at this time.    Follow up appointment on 1/28 with Dr. Fletcher Anon.

## 2016-10-11 NOTE — Telephone Encounter (Signed)
Returned call to patient-made aware of MD recommendations:  Rebekah Hampshire, MD   8:00 AM  Note    She can hold amlodipine for now. I need results of any recent labs when she comes to see me in order to decide on alternative BP medications.  If there is concerns about vascular disease, we can schedule her for lower extremity arterial doppler in our office.      Pt would like to proceed with LE dopplers Order placed.  Sent to scheduling.    Reached out to PCP-request recent lab work be faxed to office.  Spoke with Rebekah Stewart-faxing labwork to [phone]  Pt agrees and verbalized understanding.

## 2016-10-11 NOTE — Telephone Encounter (Signed)
She can hold amlodipine for now. I need results of any recent labs when she comes to see me in order to decide on alternative BP medications.  If there is concerns about vascular disease, we can schedule her for lower extremity arterial doppler in our office.

## 2016-10-12 DIAGNOSIS — R791 Abnormal coagulation profile: Secondary | ICD-10-CM | POA: Diagnosis not present

## 2016-10-15 DIAGNOSIS — L304 Erythema intertrigo: Secondary | ICD-10-CM | POA: Diagnosis not present

## 2016-10-15 DIAGNOSIS — L97911 Non-pressure chronic ulcer of unspecified part of right lower leg limited to breakdown of skin: Secondary | ICD-10-CM | POA: Diagnosis not present

## 2016-10-15 DIAGNOSIS — L97921 Non-pressure chronic ulcer of unspecified part of left lower leg limited to breakdown of skin: Secondary | ICD-10-CM | POA: Diagnosis not present

## 2016-10-15 DIAGNOSIS — I8310 Varicose veins of unspecified lower extremity with inflammation: Secondary | ICD-10-CM | POA: Diagnosis not present

## 2016-10-23 ENCOUNTER — Ambulatory Visit (INDEPENDENT_AMBULATORY_CARE_PROVIDER_SITE_OTHER): Payer: Medicare Other | Admitting: Cardiovascular Disease

## 2016-10-23 ENCOUNTER — Encounter: Payer: Self-pay | Admitting: Vascular Surgery

## 2016-10-23 VITALS — BP 124/60 | HR 98 | Ht 64.0 in | Wt 210.6 lb

## 2016-10-23 DIAGNOSIS — I5032 Chronic diastolic (congestive) heart failure: Secondary | ICD-10-CM

## 2016-10-23 DIAGNOSIS — M79604 Pain in right leg: Secondary | ICD-10-CM

## 2016-10-23 DIAGNOSIS — I2510 Atherosclerotic heart disease of native coronary artery without angina pectoris: Secondary | ICD-10-CM | POA: Diagnosis not present

## 2016-10-23 DIAGNOSIS — I482 Chronic atrial fibrillation, unspecified: Secondary | ICD-10-CM

## 2016-10-23 DIAGNOSIS — R600 Localized edema: Secondary | ICD-10-CM

## 2016-10-23 DIAGNOSIS — R0602 Shortness of breath: Secondary | ICD-10-CM | POA: Diagnosis not present

## 2016-10-23 NOTE — Patient Instructions (Signed)
Medication Instructions:  Your physician has recommended you make the following change in your medication:  1. STOP Aspirin  Labwork: No new orders.   Testing/Procedures: Your physician has requested that you have an echocardiogram. Echocardiography is a painless test that uses sound waves to create images of your heart. It provides your doctor with information about the size and shape of your heart and how well your heart's chambers and valves are working. This procedure takes approximately one hour. There are no restrictions for this procedure.  Your physician has requested that you have a lower extremity arterial duplex. This test is an ultrasound of the arteries in the legs. It looks at arterial blood flow in the legs. Allow one hour for Lower Arterial scans. There are no restrictions or special instructions  Follow-Up: Your physician wants you to follow-up in: 6 MONTHS with Dr Fletcher Anon.  You will receive a reminder letter in the mail two months in advance. If you don't receive a letter, please call our office to schedule the follow-up appointment.   Any Other Special Instructions Will Be Listed Below (If Applicable).     If you need a refill on your cardiac medications before your next appointment, please call your pharmacy.

## 2016-10-23 NOTE — Progress Notes (Signed)
Cardiology Office Note   Date:  10/23/2016   ID:  Rebekah Stewart, DOB 1935/04/08, MRN [id number]  PCP:  Nicoletta Dress, MD  Cardiologist:   Kathlyn Sacramento, MD   Chief Complaint  Patient presents with  . Follow-up    6 months      History of Present Illness: Rebekah Stewart is a 82 y.o. female who presents for  a followup visit regarding chronic atrial fibrillation, refractory hypertension and diastolic heart failure. She had a cardiac catheterization done in 2010 which showed mild to moderate three-vessel nonobstructive coronary artery disease. Worst stenosis was in the mid LAD which was estimated to be 50%. She has multiple chronic medical conditions including Diabetes and hyperlipidemia. Most recent nuclear stress test in June 2015 was normal.  She is known to have chronic bilateral leg edema which has worsened recently with stasis dermatitis. She went and saw a dermatologist who suggested stopping amlodipine. Amlodipine was discontinued and the patient also resumed taking furosemide which she has stopped taking for a while. Subsequently, the leg edema has improved. She did have significant exertional dyspnea when she stopped furosemide. She complains of right leg pain which started at the groin area with radiation to the thigh. The pain was severe especially at night. She is concerned about possible peripheral arterial disease. She denies any chest pain.   Past Medical History:  Diagnosis Date  . Arthritis of back    lumbar  . Atrial fibrillation ([place]) 2010  . Atrial fibrillation ([place])   . Atrial fibrillation ([place])   . Chronic diastolic heart failure ([place])   . Colonic polyp   . Compression fracture of thoracic vertebra (HCC)   . Congestive heart failure, unspecified   . Coronary artery disease    mild non obstructive disease per cardiac cath in 2010  . Degeneration of lumbar or lumbosacral intervertebral disc   . Diverticulosis   . DM2 (diabetes mellitus, type 2) ([place])   .  Hemorrhoids   . HLD (hyperlipidemia)   . HTN (hypertension)    diagnosed when she was in her 60s. Refractory. No RAS by [place] in 2010  . Long-term (current) use of anticoagulants   . Lumbosacral spondylosis without myelopathy   . Osteoarthrosis, unspecified whether generalized or localized, unspecified site   . Pain in thoracic spine   . Swelling of limb   . UTI (lower urinary tract infection)   . Varicose veins of lower extremities with other complications   . Vitamin D deficiency     Past Surgical History:  Procedure Laterality Date  . ADENOIDECTOMY    . CARDIAC CATHETERIZATION  2010   At [place]. Mild nonobstructive CAD  . CORONARY ANGIOPLASTY    . EYE SURGERY    . TONSILLECTOMY       Current Outpatient Prescriptions  Medication Sig Dispense Refill  . aspirin EC 81 MG tablet Take 81 mg by mouth daily.    Marland Kitchen atorvastatin (LIPITOR) 40 MG tablet Take 40 mg by mouth daily.    . benazepril (LOTENSIN) 20 MG tablet Take 20 mg by mouth daily.     . carvedilol (COREG) 25 MG tablet Take 25 mg by mouth 2 (two) times daily with a meal.    . Cholecalciferol (VITAMIN D) 2000 UNITS CAPS Take 1 capsule by mouth daily.    . fish oil-omega-3 fatty acids 1000 MG capsule Take 2 g by mouth 2 (two) times daily.     . furosemide (LASIX) 40  MG tablet Take 20 mg by mouth daily.     Marland Kitchen glucose blood (ACCU-CHEK AVIVA PLUS) test strip Check two times daily. Dx code E11.65 100 each 1  . isosorbide mononitrate (IMDUR) 60 MG 24 hr tablet Take 60 mg by mouth daily.      . metFORMIN (GLUCOPHAGE) 500 MG tablet Take 1 tablet (500 mg total) by mouth 3 (three) times daily. (Patient taking differently: Take 500 mg by mouth 2 (two) times daily with a meal. ) 270 tablet 1  . spironolactone (ALDACTONE) 25 MG tablet Take 25 mg by mouth daily.  3  . warfarin (COUMADIN) 4 MG tablet Take 4 mg by mouth as directed.     No current facility-administered medications for this visit.     Allergies:   Bee venom;  Iodinated diagnostic agents; Penicillins; and Tape    Social History:  The patient  reports that she has never smoked. She has never used smokeless tobacco. She reports that she does not drink alcohol or use drugs.   Family History:  The patient's family history includes Cancer in her sister; Heart attack in her father; Heart disease in her brother, father, and mother; Stroke in her mother.    ROS:  Please see the history of present illness.   Otherwise, review of systems are positive for none.   All other systems are reviewed and negative.    PHYSICAL EXAM: VS:  BP 124/60   Pulse 98   Ht _0  (1.626 m)   Wt 210 lb 9.6 oz (95.5 kg)   BMI 36.15 kg/m  , BMI Body mass index is 36.15 kg/m. GEN: Well nourished, well developed, in no acute distress  HEENT: normal  Neck: no JVD, carotid bruits, or masses Cardiac: Irregularly irregular ; no murmurs, rubs, or gallops, +2 edema Respiratory:  clear to auscultation bilaterally, normal work of breathing GI: soft, nontender, nondistended, + BS MS: no deformity or atrophy  Skin: warm and dry, no rash Neuro:  Strength and sensation are intact Psych: euthymic mood, full affect Dorsalis pedis is palpable bilaterally.  EKG:  EKG is not ordered today.    Recent Labs: No results found for requested labs within last 8760 hours.    Lipid Panel    Component Value Date/Time   LDLCALC 75 03/12/2014      Wt Readings from Last 3 Encounters:  10/23/16 210 lb 9.6 oz (95.5 kg)  04/10/16 211 lb 3.2 oz (95.8 kg)  12/07/14 210 lb 12.8 oz (95.6 kg)    Recent records were reviewed including labs which showed unremarkable CBC. BUN was 17 with a creatinine of 0.89. Potassium was 4. Albumin was 3.9. LDL was 60. X-ray of the hip showed symmetrical narrowing of both hip joints with no fractures. Calcifications were noted on the common femoral and superficial femoral arteries.    ASSESSMENT AND PLAN:  1.  Chronic atrial fibrillation:  Ventricular  rate is reasonably controlled on carvedilol. Diltiazem was added during last visit but she could not afford the medication. She is on long-term anticoagulation with warfarin. I discontinued aspirin today to minimize the risk of bleeding.  2. Essential hypertension:  Blood pressure is controlled even after stopping amlodipine.  3. Coronary artery disease involving native coronary arteries without angina: She has no clear anginal symptoms. . Most recent stress test was in 2015 and was normal.  4. Hyperlipidemia: Continue treatment with atorvastatin. Most recent LDL was optimal.  5. Chronic diastolic heart failure: Recent worsening of shortness  of breath and leg edema. I requested an echocardiogram.  6. Chronic venous insufficiency: This is likely the reason for her chronic leg edema. She has an appointment to see Dr. Kellie Simmering next week.  7. Right leg pain: I doubt a vascular etiology but she is concerned about peripheral arterial disease. Distal pulses are palpable. Recent plain x-ray did show calcification on the common femoral and superficial femoral arteries. Thus, I requested lower extremity arterial Doppler.   Disposition:   FU with me in 6 months  Signed,  Kathlyn Sacramento, MD  10/23/2016 11:27 AM    [place]

## 2016-10-26 DIAGNOSIS — R791 Abnormal coagulation profile: Secondary | ICD-10-CM | POA: Diagnosis not present

## 2016-10-26 DIAGNOSIS — Z23 Encounter for immunization: Secondary | ICD-10-CM | POA: Diagnosis not present

## 2016-10-30 ENCOUNTER — Encounter: Payer: Self-pay | Admitting: Vascular Surgery

## 2016-10-30 ENCOUNTER — Ambulatory Visit (INDEPENDENT_AMBULATORY_CARE_PROVIDER_SITE_OTHER): Payer: Medicare Other | Admitting: Vascular Surgery

## 2016-10-30 VITALS — BP 149/86 | HR 99 | Temp 97.5°F | Resp 16 | Ht 64.0 in | Wt 206.0 lb

## 2016-10-30 DIAGNOSIS — I2510 Atherosclerotic heart disease of native coronary artery without angina pectoris: Secondary | ICD-10-CM | POA: Diagnosis not present

## 2016-10-30 DIAGNOSIS — I83893 Varicose veins of bilateral lower extremities with other complications: Secondary | ICD-10-CM | POA: Diagnosis not present

## 2016-10-30 NOTE — Progress Notes (Signed)
Vitals:   10/30/16 1024  BP: (!) 159/96  Pulse: 99  Resp: 16  Temp: 97.5 F (36.4 C)  SpO2: 96%  Weight: 206 lb (93.4 kg)  Height: _0  (1.626 m)

## 2016-10-30 NOTE — Progress Notes (Signed)
Subjective:     Patient ID: Rebekah Stewart, female   DOB: [birth year], 82 y.o.   MRN: [id number]  HPI His 82 year old female was referred by Dr. [name] for evaluation of Varicose veins with pain and swelling in the right lower extremity. The patient has had varicose veins in the right leg for many years. She has been having aching throbbing discomfort in the right thigh and hip region which has been severe she states. She also has had symptoms in the contralateral left inguinal area. She has no history of DVT stasis ulcers or bleeding. She does not were elastic compression stockings. She states her legs of been quite weak and she is not very mobile. She does take anticoagulants-warfarin for atrial fib.  Past Medical History:  Diagnosis Date  . Arthritis of back    lumbar  . Atrial fibrillation ([place]) 2010  . Atrial fibrillation ([place])   . Atrial fibrillation ([place])   . Chronic diastolic heart failure ([place])   . Colonic polyp   . Compression fracture of thoracic vertebra (HCC)   . Congestive heart failure, unspecified   . Coronary artery disease    mild non obstructive disease per cardiac cath in 2010  . Degeneration of lumbar or lumbosacral intervertebral disc   . Diverticulosis   . DM2 (diabetes mellitus, type 2) ([place])   . Hemorrhoids   . HLD (hyperlipidemia)   . HTN (hypertension)    diagnosed when she was in her 53s. Refractory. No RAS by [place] in 2010  . Long-term (current) use of anticoagulants   . Lumbosacral spondylosis without myelopathy   . Osteoarthrosis, unspecified whether generalized or localized, unspecified site   . Pain in thoracic spine   . Swelling of limb   . UTI (lower urinary tract infection)   . Varicose veins of lower extremities with other complications   . Vitamin D deficiency     Social History  Substance Use Topics  . Smoking status: Never Smoker  . Smokeless tobacco: Never Used  . Alcohol use No    Family History  Problem Relation Age of Onset  . Stroke  Mother   . Heart disease Mother   . Heart attack Father   . Heart disease Father   . Heart disease    . Cancer Sister   . Heart disease Brother     Allergies  Allergen Reactions  . Bee Venom Shortness Of Breath and Swelling  . Iodinated Diagnostic Agents Itching  . Penicillins Swelling  . Tape     Pulls the skin     Current Outpatient Prescriptions:  .  atorvastatin (LIPITOR) 40 MG tablet, Take 40 mg by mouth daily., Disp: , Rfl:  .  benazepril (LOTENSIN) 20 MG tablet, Take 20 mg by mouth daily. , Disp: , Rfl:  .  carvedilol (COREG) 25 MG tablet, Take 25 mg by mouth 2 (two) times daily with a meal., Disp: , Rfl:  .  Cholecalciferol (VITAMIN D) 2000 UNITS CAPS, Take 1 capsule by mouth daily., Disp: , Rfl:  .  fish oil-omega-3 fatty acids 1000 MG capsule, Take 2 g by mouth 2 (two) times daily. , Disp: , Rfl:  .  furosemide (LASIX) 40 MG tablet, Take 20 mg by mouth daily. , Disp: , Rfl:  .  glucose blood (ACCU-CHEK AVIVA PLUS) test strip, Check two times daily. Dx code E11.65, Disp: 100 each, Rfl: 1 .  isosorbide mononitrate (IMDUR) 60 MG 24 hr tablet, Take 60 mg by mouth daily.  ,  Disp: , Rfl:  .  metFORMIN (GLUCOPHAGE) 500 MG tablet, Take 1 tablet (500 mg total) by mouth 3 (three) times daily. (Patient taking differently: Take 500 mg by mouth 2 (two) times daily with a meal. ), Disp: 270 tablet, Rfl: 1 .  spironolactone (ALDACTONE) 25 MG tablet, Take 25 mg by mouth daily., Disp: , Rfl: 3 .  warfarin (COUMADIN) 4 MG tablet, Take 4 mg by mouth as directed., Disp: , Rfl:  .  warfarin (COUMADIN) 5 MG tablet, TAKE 1 TABLET BY MOUTH DAILY AND 1.5 TABLETS ON SUNDAY AS DIRECTED, Disp: , Rfl: 0  Vitals:   10/30/16 1024 10/30/16 1029  BP: (!) 159/96 (!) 149/86  Pulse: 99 99  Resp: 16   Temp: 97.5 F (36.4 C)   SpO2: 96%   Weight: 206 lb (93.4 kg)   Height: _0  (1.626 m)     Body mass index is 35.36 kg/m.         Review of Systems Patient has history of chronic atrial  fib, congestive heart failure, previous stroke, diabetes mellitus. See history of present illness  history of degenerative joint disease. Other systems negative and complete review of systems Objective:   Physical Exam BP (!) 149/86 (BP Location: Left Arm, Patient Position: Sitting, Cuff Size: Large)   Pulse 99   Temp 97.5 F (36.4 C)   Resp 16   Ht _1  (1.626 m)   Wt 206 lb (93.4 kg)   SpO2 96%   BMI 35.36 kg/m     Gen.-alert and oriented x3 in no apparent distress-obese HEENT normal for age Lungs no rhonchi or wheezing Cardiovascular irregular rhythm no murmurs carotid pulses 3+ palpable no bruits audible Abdomen soft nontender no palpable masses-obese Musculoskeletal free of  major deformities Skin clear -no rashes Neurologic normal Lower extremities 3+ femoral and dorsalis pedis pulses palpable bilaterally with no edema on the left 1+ on the right Bulging varicosities right proximal anterior thigh extending laterally and extensive reticular veins below the knee. No hyperpigmentation or ulceration noted.  Today I performed a bedside SonoSite ultrasound exam of the right leg. The great saphenous vein is normal in caliber down to the below-knee level. There is a very short anterior accessory branch of the right great saphenous vein which supplies these varicosities in the anterior thigh. The vein is too short to be treated with laser ablation.       Assessment:     #1 varicose veins right leg due to gross reflux right anterior accessory branch great saphenous vein #2 severe right thigh and inguinal and hip pain-not due to #1 #3 chronic atrial fib on chronic anticoagulation-Coumadin #4 chronic congestive heart failure #5 history of CVA Exline #6 diabetes mellitus    Plan:     I do not feel that the patient's varicose veins are causing her leg discomfort and weakness which is mostly in the thigh and inguinal and hip area. Do not recommend any treatment of her venous disease  in this patient with multiple comorbidities Consider referral to either neurologist or orthopedic surgeon to help determine etiology of thigh and inguinal and hip discomfort Return to see me on a when necessary basis

## 2016-10-31 ENCOUNTER — Encounter: Payer: Self-pay | Admitting: Internal Medicine

## 2016-11-01 DIAGNOSIS — X58XXXA Exposure to other specified factors, initial encounter: Secondary | ICD-10-CM | POA: Diagnosis not present

## 2016-11-01 DIAGNOSIS — M1611 Unilateral primary osteoarthritis, right hip: Secondary | ICD-10-CM | POA: Diagnosis not present

## 2016-11-01 DIAGNOSIS — S76011A Strain of muscle, fascia and tendon of right hip, initial encounter: Secondary | ICD-10-CM | POA: Diagnosis not present

## 2016-11-02 ENCOUNTER — Ambulatory Visit: Payer: Medicare Other | Admitting: Sports Medicine

## 2016-11-02 DIAGNOSIS — M1611 Unilateral primary osteoarthritis, right hip: Secondary | ICD-10-CM | POA: Diagnosis not present

## 2016-11-02 DIAGNOSIS — S76911A Strain of unspecified muscles, fascia and tendons at thigh level, right thigh, initial encounter: Secondary | ICD-10-CM | POA: Diagnosis not present

## 2016-11-02 DIAGNOSIS — R791 Abnormal coagulation profile: Secondary | ICD-10-CM | POA: Diagnosis not present

## 2016-11-05 ENCOUNTER — Telehealth: Payer: Self-pay | Admitting: Cardiovascular Disease

## 2016-11-05 NOTE — Telephone Encounter (Signed)
The tests can be rescheduled. She should avoid Diclofenac but Tramadol is ok.

## 2016-11-05 NOTE — Telephone Encounter (Signed)
Patient had a torn tendon on right hip and starts PT this Friday. Patient wants to know if she can rescheduled her echo and Lower Extremity Doppler for a later date, since she is having trouble getting around. Patient also was prescribed Tramadol 50 mg for pain and Diclofenac Sodium Cream for her hip. Patient stated she was scared to take medications, due to her medical history. Patient would like Dr. Tyrell Antonio recommendations of having test done later and taking these two new medications. Will forward to Dr. Fletcher Anon and his nurse.

## 2016-11-05 NOTE — Telephone Encounter (Signed)
New message     10/23/16 Pt seen Dr Fletcher Anon, She has a ripped tendon and was put on medication, should she still have the doppler done now??

## 2016-11-05 NOTE — Telephone Encounter (Signed)
I spoke with the pt and at this time we will cancel appointments on 11/07/16. The pt is starting PT on Friday and she will call the office to reschedule once she is back on her feet. I also advised the pt that she should avoid Diclofenac but she is okay to take Tramadol as prescribed.

## 2016-11-07 ENCOUNTER — Encounter (HOSPITAL_COMMUNITY): Payer: Medicare Other

## 2016-11-07 ENCOUNTER — Other Ambulatory Visit (HOSPITAL_COMMUNITY): Payer: Medicare Other

## 2016-11-07 DIAGNOSIS — R2689 Other abnormalities of gait and mobility: Secondary | ICD-10-CM | POA: Diagnosis not present

## 2016-11-07 DIAGNOSIS — M25551 Pain in right hip: Secondary | ICD-10-CM | POA: Diagnosis not present

## 2016-11-07 DIAGNOSIS — M6281 Muscle weakness (generalized): Secondary | ICD-10-CM | POA: Diagnosis not present

## 2016-11-09 DIAGNOSIS — R2689 Other abnormalities of gait and mobility: Secondary | ICD-10-CM | POA: Diagnosis not present

## 2016-11-09 DIAGNOSIS — M6281 Muscle weakness (generalized): Secondary | ICD-10-CM | POA: Diagnosis not present

## 2016-11-09 DIAGNOSIS — M25551 Pain in right hip: Secondary | ICD-10-CM | POA: Diagnosis not present

## 2016-11-13 DIAGNOSIS — M6281 Muscle weakness (generalized): Secondary | ICD-10-CM | POA: Diagnosis not present

## 2016-11-13 DIAGNOSIS — M25551 Pain in right hip: Secondary | ICD-10-CM | POA: Diagnosis not present

## 2016-11-13 DIAGNOSIS — R2689 Other abnormalities of gait and mobility: Secondary | ICD-10-CM | POA: Diagnosis not present

## 2016-11-15 ENCOUNTER — Ambulatory Visit: Payer: Medicare Other | Admitting: Sports Medicine

## 2016-11-16 DIAGNOSIS — M6281 Muscle weakness (generalized): Secondary | ICD-10-CM | POA: Diagnosis not present

## 2016-11-16 DIAGNOSIS — R2689 Other abnormalities of gait and mobility: Secondary | ICD-10-CM | POA: Diagnosis not present

## 2016-11-16 DIAGNOSIS — M25551 Pain in right hip: Secondary | ICD-10-CM | POA: Diagnosis not present

## 2016-11-16 DIAGNOSIS — R791 Abnormal coagulation profile: Secondary | ICD-10-CM | POA: Diagnosis not present

## 2016-11-19 DIAGNOSIS — R2689 Other abnormalities of gait and mobility: Secondary | ICD-10-CM | POA: Diagnosis not present

## 2016-11-19 DIAGNOSIS — M25551 Pain in right hip: Secondary | ICD-10-CM | POA: Diagnosis not present

## 2016-11-19 DIAGNOSIS — M6281 Muscle weakness (generalized): Secondary | ICD-10-CM | POA: Diagnosis not present

## 2016-11-23 DIAGNOSIS — R2689 Other abnormalities of gait and mobility: Secondary | ICD-10-CM | POA: Diagnosis not present

## 2016-11-23 DIAGNOSIS — M6281 Muscle weakness (generalized): Secondary | ICD-10-CM | POA: Diagnosis not present

## 2016-11-23 DIAGNOSIS — M25551 Pain in right hip: Secondary | ICD-10-CM | POA: Diagnosis not present

## 2016-11-23 DIAGNOSIS — R791 Abnormal coagulation profile: Secondary | ICD-10-CM | POA: Diagnosis not present

## 2016-11-26 DIAGNOSIS — R2689 Other abnormalities of gait and mobility: Secondary | ICD-10-CM | POA: Diagnosis not present

## 2016-11-26 DIAGNOSIS — M25551 Pain in right hip: Secondary | ICD-10-CM | POA: Diagnosis not present

## 2016-11-26 DIAGNOSIS — M6281 Muscle weakness (generalized): Secondary | ICD-10-CM | POA: Diagnosis not present

## 2016-11-30 DIAGNOSIS — M25551 Pain in right hip: Secondary | ICD-10-CM | POA: Diagnosis not present

## 2016-11-30 DIAGNOSIS — M6281 Muscle weakness (generalized): Secondary | ICD-10-CM | POA: Diagnosis not present

## 2016-11-30 DIAGNOSIS — E1140 Type 2 diabetes mellitus with diabetic neuropathy, unspecified: Secondary | ICD-10-CM | POA: Diagnosis not present

## 2016-11-30 DIAGNOSIS — E785 Hyperlipidemia, unspecified: Secondary | ICD-10-CM | POA: Diagnosis not present

## 2016-11-30 DIAGNOSIS — I4891 Unspecified atrial fibrillation: Secondary | ICD-10-CM | POA: Diagnosis not present

## 2016-11-30 DIAGNOSIS — I110 Hypertensive heart disease with heart failure: Secondary | ICD-10-CM | POA: Diagnosis not present

## 2016-11-30 DIAGNOSIS — Z6836 Body mass index (BMI) 36.0-36.9, adult: Secondary | ICD-10-CM | POA: Diagnosis not present

## 2016-11-30 DIAGNOSIS — R791 Abnormal coagulation profile: Secondary | ICD-10-CM | POA: Diagnosis not present

## 2016-11-30 DIAGNOSIS — E559 Vitamin D deficiency, unspecified: Secondary | ICD-10-CM | POA: Diagnosis not present

## 2016-11-30 DIAGNOSIS — R2689 Other abnormalities of gait and mobility: Secondary | ICD-10-CM | POA: Diagnosis not present

## 2016-12-03 DIAGNOSIS — M25551 Pain in right hip: Secondary | ICD-10-CM | POA: Diagnosis not present

## 2016-12-03 DIAGNOSIS — R2689 Other abnormalities of gait and mobility: Secondary | ICD-10-CM | POA: Diagnosis not present

## 2016-12-03 DIAGNOSIS — M6281 Muscle weakness (generalized): Secondary | ICD-10-CM | POA: Diagnosis not present

## 2016-12-12 DIAGNOSIS — M25551 Pain in right hip: Secondary | ICD-10-CM | POA: Diagnosis not present

## 2016-12-12 DIAGNOSIS — E1140 Type 2 diabetes mellitus with diabetic neuropathy, unspecified: Secondary | ICD-10-CM | POA: Diagnosis not present

## 2016-12-12 DIAGNOSIS — M6281 Muscle weakness (generalized): Secondary | ICD-10-CM | POA: Diagnosis not present

## 2016-12-12 DIAGNOSIS — Z79899 Other long term (current) drug therapy: Secondary | ICD-10-CM | POA: Diagnosis not present

## 2016-12-12 DIAGNOSIS — R2689 Other abnormalities of gait and mobility: Secondary | ICD-10-CM | POA: Diagnosis not present

## 2016-12-12 DIAGNOSIS — R791 Abnormal coagulation profile: Secondary | ICD-10-CM | POA: Diagnosis not present

## 2016-12-12 DIAGNOSIS — E785 Hyperlipidemia, unspecified: Secondary | ICD-10-CM | POA: Diagnosis not present

## 2016-12-14 DIAGNOSIS — M25551 Pain in right hip: Secondary | ICD-10-CM | POA: Diagnosis not present

## 2016-12-14 DIAGNOSIS — M6281 Muscle weakness (generalized): Secondary | ICD-10-CM | POA: Diagnosis not present

## 2016-12-14 DIAGNOSIS — R2689 Other abnormalities of gait and mobility: Secondary | ICD-10-CM | POA: Diagnosis not present

## 2016-12-18 DIAGNOSIS — S76911D Strain of unspecified muscles, fascia and tendons at thigh level, right thigh, subsequent encounter: Secondary | ICD-10-CM | POA: Diagnosis not present

## 2016-12-20 DIAGNOSIS — R791 Abnormal coagulation profile: Secondary | ICD-10-CM | POA: Diagnosis not present

## 2016-12-24 DIAGNOSIS — R2689 Other abnormalities of gait and mobility: Secondary | ICD-10-CM | POA: Diagnosis not present

## 2016-12-24 DIAGNOSIS — M6281 Muscle weakness (generalized): Secondary | ICD-10-CM | POA: Diagnosis not present

## 2016-12-24 DIAGNOSIS — M25551 Pain in right hip: Secondary | ICD-10-CM | POA: Diagnosis not present

## 2016-12-26 DIAGNOSIS — H40013 Open angle with borderline findings, low risk, bilateral: Secondary | ICD-10-CM | POA: Diagnosis not present

## 2016-12-26 DIAGNOSIS — H35373 Puckering of macula, bilateral: Secondary | ICD-10-CM | POA: Diagnosis not present

## 2016-12-26 DIAGNOSIS — H35341 Macular cyst, hole, or pseudohole, right eye: Secondary | ICD-10-CM | POA: Diagnosis not present

## 2016-12-26 DIAGNOSIS — H2511 Age-related nuclear cataract, right eye: Secondary | ICD-10-CM | POA: Diagnosis not present

## 2016-12-27 ENCOUNTER — Ambulatory Visit (INDEPENDENT_AMBULATORY_CARE_PROVIDER_SITE_OTHER): Payer: Medicare Other | Admitting: Sports Medicine

## 2016-12-27 DIAGNOSIS — B351 Tinea unguium: Secondary | ICD-10-CM | POA: Diagnosis not present

## 2016-12-27 DIAGNOSIS — E1140 Type 2 diabetes mellitus with diabetic neuropathy, unspecified: Secondary | ICD-10-CM

## 2016-12-27 DIAGNOSIS — M79676 Pain in unspecified toe(s): Secondary | ICD-10-CM

## 2016-12-27 NOTE — Progress Notes (Signed)
Subjective: Rebekah Stewart is a 82 y.o. female patient with history of diabetes who returns to office today complaining of long, painful nails  while ambulating in shoes; unable to trim. Patient states that the glucose reading this morning was not recorded. Patient denies any new cramping, numbness, burning or tingling in the legs.  Patient Active Problem List   Diagnosis Date Noted  . Varicose veins of lower extremities with complications, bilateral 10/30/2016  . Pure hypercholesterolemia 05/21/2014  . Bilateral leg pain 11/26/2012  . Chronic diastolic heart failure ([place])   . Atrial fibrillation ([place])   . HTN (hypertension)   . Coronary artery disease    Current Outpatient Prescriptions on File Prior to Visit  Medication Sig Dispense Refill  . atorvastatin (LIPITOR) 40 MG tablet Take 40 mg by mouth daily.    . benazepril (LOTENSIN) 20 MG tablet Take 20 mg by mouth daily.     . carvedilol (COREG) 25 MG tablet Take 25 mg by mouth 2 (two) times daily with a meal.    . fish oil-omega-3 fatty acids 1000 MG capsule Take 2 g by mouth 2 (two) times daily.     . furosemide (LASIX) 40 MG tablet Take 20 mg by mouth daily.     Marland Kitchen glucose blood (ACCU-CHEK AVIVA PLUS) test strip Check two times daily. Dx code E11.65 100 each 1  . isosorbide mononitrate (IMDUR) 60 MG 24 hr tablet Take 60 mg by mouth daily.      . metFORMIN (GLUCOPHAGE) 500 MG tablet Take 1 tablet (500 mg total) by mouth 3 (three) times daily. (Patient taking differently: Take 500 mg by mouth 2 (two) times daily with a meal. ) 270 tablet 1  . spironolactone (ALDACTONE) 25 MG tablet Take 25 mg by mouth daily.  3  . warfarin (COUMADIN) 5 MG tablet TAKE 1 TABLET BY MOUTH DAILY AND 1.5 TABLETS ON SUNDAY AS DIRECTED  0  . Cholecalciferol (VITAMIN D) 2000 UNITS CAPS Take 1 capsule by mouth daily.    Marland Kitchen warfarin (COUMADIN) 4 MG tablet Take 4 mg by mouth as directed.     No current facility-administered medications on file prior to visit.     Allergies  Allergen Reactions  . Bee Venom Shortness Of Breath and Swelling  . Iodinated Diagnostic Agents Itching  . Penicillins Swelling  . Tape     Pulls the skin    No results found for this or any previous visit (from the past 2160 hour(s)).  Objective: General: Patient is awake, alert, and oriented x 3 and in no acute distress.  Integument: Skin is warm, dry and supple bilateral. Nails are tender, long, thickened and dystrophic with subungual debris, consistent with onychomycosis, 1-5 bilateral.  No acute signs of infection. No open lesions or preulcerative lesions present bilateral. Remaining integument unremarkable.  Vasculature:  Dorsalis Pedis pulse 1/4 bilateral. Posterior Tibial pulse  1/4 bilateral.  Capillary fill time <5 sec 1-5 bilateral. Diminished hair growth to the level of the digits. Temperature gradient within normal limits. Mild varicosities present bilateral. Trace edema present bilateral.   Neurology: The patient has diminished sensation measured with a 5.07/10g Semmes Weinstein Monofilament at all pedal sites bilateral . Vibratory sensation diminished bilateral with tuning fork. No Babinski sign present bilateral.   Musculoskeletal: No tenderness bilateral. Muscular strength 5/5 in all lower extremity muscular groups bilateral without pain on range of motion. No tenderness with calf compression bilateral. Asymptomatic bunion and hammertoes bilateral.  Assessment and Plan: Problem List  Items Addressed This Visit    None    Visit Diagnoses    Dermatophytosis of nail    -  Primary   Pain of toe, unspecified laterality       Type 2 diabetes, controlled, with neuropathy (HCC)       Relevant Medications   aspirin 81 MG tablet     -Examined patient. -Discussed and educated patient on diabetic foot care, especially with regards to the vascular, neurological and musculoskeletal systems.  -Stressed the importance of good glycemic control and the detriment of  not controlling glucose levels in relation to the foot. -Mechanically debrided all nails 1-5 bilateral using sterile nail nipper and filed with dremel without incident  -Answered all patient questions -Patient to return  in 3 months for at risk foot care -Patient advised to call the office if any problems or questions arise in the meantime.  Landis Martins, DPM

## 2017-01-03 DIAGNOSIS — R791 Abnormal coagulation profile: Secondary | ICD-10-CM | POA: Diagnosis not present

## 2017-01-15 ENCOUNTER — Encounter: Payer: Self-pay | Admitting: Cardiovascular Disease

## 2017-01-16 DIAGNOSIS — R791 Abnormal coagulation profile: Secondary | ICD-10-CM | POA: Diagnosis not present

## 2017-01-23 DIAGNOSIS — Z7901 Long term (current) use of anticoagulants: Secondary | ICD-10-CM | POA: Diagnosis not present

## 2017-01-30 DIAGNOSIS — R791 Abnormal coagulation profile: Secondary | ICD-10-CM | POA: Diagnosis not present

## 2017-01-31 ENCOUNTER — Encounter (INDEPENDENT_AMBULATORY_CARE_PROVIDER_SITE_OTHER): Payer: Medicare Other | Admitting: Ophthalmology

## 2017-02-06 DIAGNOSIS — R791 Abnormal coagulation profile: Secondary | ICD-10-CM | POA: Diagnosis not present

## 2017-02-11 ENCOUNTER — Encounter (INDEPENDENT_AMBULATORY_CARE_PROVIDER_SITE_OTHER): Payer: Medicare Other | Admitting: Ophthalmology

## 2017-02-20 DIAGNOSIS — R791 Abnormal coagulation profile: Secondary | ICD-10-CM | POA: Diagnosis not present

## 2017-02-26 ENCOUNTER — Telehealth (HOSPITAL_COMMUNITY): Payer: Self-pay | Admitting: Cardiovascular Disease

## 2017-02-26 NOTE — Telephone Encounter (Signed)
  02/26/2017 09:14 AM Phone (Outgoing) Rebekah Stewart, Rebekah Stewart (Self) [phone] (H)   No Answer/Busy - Called pt and attempted to lmsg but her VM was full.     By Cherie Dark A           Close PreviousNext   Pt cancelled on 11/07/16 and a letter was mailed out on 4/17 for her to CB to r/s echo.

## 2017-02-27 DIAGNOSIS — R791 Abnormal coagulation profile: Secondary | ICD-10-CM | POA: Diagnosis not present

## 2017-03-06 DIAGNOSIS — R791 Abnormal coagulation profile: Secondary | ICD-10-CM | POA: Diagnosis not present

## 2017-03-06 DIAGNOSIS — E559 Vitamin D deficiency, unspecified: Secondary | ICD-10-CM | POA: Diagnosis not present

## 2017-03-06 DIAGNOSIS — E785 Hyperlipidemia, unspecified: Secondary | ICD-10-CM | POA: Diagnosis not present

## 2017-03-06 DIAGNOSIS — E1140 Type 2 diabetes mellitus with diabetic neuropathy, unspecified: Secondary | ICD-10-CM | POA: Diagnosis not present

## 2017-03-06 DIAGNOSIS — Z79899 Other long term (current) drug therapy: Secondary | ICD-10-CM | POA: Diagnosis not present

## 2017-03-08 DIAGNOSIS — E559 Vitamin D deficiency, unspecified: Secondary | ICD-10-CM | POA: Diagnosis not present

## 2017-03-08 DIAGNOSIS — Z6837 Body mass index (BMI) 37.0-37.9, adult: Secondary | ICD-10-CM | POA: Diagnosis not present

## 2017-03-08 DIAGNOSIS — I4891 Unspecified atrial fibrillation: Secondary | ICD-10-CM | POA: Diagnosis not present

## 2017-03-08 DIAGNOSIS — I110 Hypertensive heart disease with heart failure: Secondary | ICD-10-CM | POA: Diagnosis not present

## 2017-03-08 DIAGNOSIS — E1140 Type 2 diabetes mellitus with diabetic neuropathy, unspecified: Secondary | ICD-10-CM | POA: Diagnosis not present

## 2017-03-08 DIAGNOSIS — E785 Hyperlipidemia, unspecified: Secondary | ICD-10-CM | POA: Diagnosis not present

## 2017-03-11 DIAGNOSIS — H40013 Open angle with borderline findings, low risk, bilateral: Secondary | ICD-10-CM | POA: Diagnosis not present

## 2017-03-11 DIAGNOSIS — H1851 Endothelial corneal dystrophy: Secondary | ICD-10-CM | POA: Diagnosis not present

## 2017-03-11 DIAGNOSIS — H04123 Dry eye syndrome of bilateral lacrimal glands: Secondary | ICD-10-CM | POA: Diagnosis not present

## 2017-03-11 DIAGNOSIS — H5713 Ocular pain, bilateral: Secondary | ICD-10-CM | POA: Diagnosis not present

## 2017-03-13 DIAGNOSIS — R791 Abnormal coagulation profile: Secondary | ICD-10-CM | POA: Diagnosis not present

## 2017-03-14 DIAGNOSIS — L820 Inflamed seborrheic keratosis: Secondary | ICD-10-CM | POA: Diagnosis not present

## 2017-03-14 DIAGNOSIS — D692 Other nonthrombocytopenic purpura: Secondary | ICD-10-CM | POA: Diagnosis not present

## 2017-03-14 DIAGNOSIS — D1801 Hemangioma of skin and subcutaneous tissue: Secondary | ICD-10-CM | POA: Diagnosis not present

## 2017-03-14 DIAGNOSIS — L821 Other seborrheic keratosis: Secondary | ICD-10-CM | POA: Diagnosis not present

## 2017-03-14 DIAGNOSIS — L814 Other melanin hyperpigmentation: Secondary | ICD-10-CM | POA: Diagnosis not present

## 2017-03-14 DIAGNOSIS — L304 Erythema intertrigo: Secondary | ICD-10-CM | POA: Diagnosis not present

## 2017-03-20 DIAGNOSIS — R791 Abnormal coagulation profile: Secondary | ICD-10-CM | POA: Diagnosis not present

## 2017-03-21 ENCOUNTER — Other Ambulatory Visit: Payer: Self-pay | Admitting: Cardiovascular Disease

## 2017-03-21 DIAGNOSIS — M79604 Pain in right leg: Secondary | ICD-10-CM

## 2017-03-28 DIAGNOSIS — R791 Abnormal coagulation profile: Secondary | ICD-10-CM | POA: Diagnosis not present

## 2017-03-29 ENCOUNTER — Ambulatory Visit (INDEPENDENT_AMBULATORY_CARE_PROVIDER_SITE_OTHER): Payer: Medicare Other | Admitting: Sports Medicine

## 2017-03-29 ENCOUNTER — Ambulatory Visit: Payer: Medicare Other | Admitting: *Deleted

## 2017-03-29 DIAGNOSIS — E1140 Type 2 diabetes mellitus with diabetic neuropathy, unspecified: Secondary | ICD-10-CM

## 2017-03-29 DIAGNOSIS — B351 Tinea unguium: Secondary | ICD-10-CM | POA: Diagnosis not present

## 2017-03-29 DIAGNOSIS — M79676 Pain in unspecified toe(s): Secondary | ICD-10-CM

## 2017-03-29 NOTE — Progress Notes (Signed)
Patient ID: Rebekah Stewart, female   DOB: 28-Nov-1934, 82 y.o.   MRN: [id number]    Patient presents to be measured and molded for diabetic shoes and inserts. Patient measures at a 9 wide with a Branock device.  Patient chose the Apex Emmy A720W in black.  We will call to schedule a fitting when shoes and inserts arrive.

## 2017-03-29 NOTE — Progress Notes (Signed)
Subjective: Rebekah Stewart is a 82 y.o. female patient with history of diabetes who returns to office today complaining of long, painful nails  while ambulating in shoes; unable to trim. Patient states that the glucose reading this morning was not recorded. Patient desires new set of diabetic shoes. Patient denies any other pedal complaints.  Patient Active Problem List   Diagnosis Date Noted  . Varicose veins of lower extremities with complications, bilateral 10/30/2016  . Pure hypercholesterolemia 05/21/2014  . Bilateral leg pain 11/26/2012  . Chronic diastolic heart failure ([place])   . Atrial fibrillation ([place])   . HTN (hypertension)   . Coronary artery disease    Current Outpatient Prescriptions on File Prior to Visit  Medication Sig Dispense Refill  . aspirin 81 MG tablet Take 81 mg by mouth daily.    Marland Kitchen atorvastatin (LIPITOR) 40 MG tablet Take 40 mg by mouth daily.    . benazepril (LOTENSIN) 20 MG tablet Take 20 mg by mouth daily.     . carvedilol (COREG) 25 MG tablet Take 25 mg by mouth 2 (two) times daily with a meal.    . Cholecalciferol (VITAMIN D) 2000 UNITS CAPS Take 1 capsule by mouth daily.    . fish oil-omega-3 fatty acids 1000 MG capsule Take 2 g by mouth 2 (two) times daily.     . furosemide (LASIX) 40 MG tablet Take 20 mg by mouth daily.     Marland Kitchen glucose blood (ACCU-CHEK AVIVA PLUS) test strip Check two times daily. Dx code E11.65 100 each 1  . isosorbide mononitrate (IMDUR) 60 MG 24 hr tablet Take 60 mg by mouth daily.      . metFORMIN (GLUCOPHAGE) 500 MG tablet Take 1 tablet (500 mg total) by mouth 3 (three) times daily. (Patient taking differently: Take 500 mg by mouth 2 (two) times daily with a meal. ) 270 tablet 1  . spironolactone (ALDACTONE) 25 MG tablet Take 25 mg by mouth daily.  3  . warfarin (COUMADIN) 4 MG tablet Take 4 mg by mouth as directed.    . warfarin (COUMADIN) 5 MG tablet TAKE 1 TABLET BY MOUTH DAILY AND 1.5 TABLETS ON SUNDAY AS DIRECTED  0   No current  facility-administered medications on file prior to visit.    Allergies  Allergen Reactions  . Bee Venom Shortness Of Breath and Swelling  . Iodinated Diagnostic Agents Itching  . Penicillins Swelling  . Tape     Pulls the skin    No results found for this or any previous visit (from the past 2160 hour(s)).  Objective: General: Patient is awake, alert, and oriented x 3 and in no acute distress.  Integument: Skin is warm, dry and supple bilateral. Nails are tender, long, thickened and dystrophic with subungual debris, consistent with onychomycosis, 1-5 bilateral.  No acute signs of infection. No open lesions or preulcerative lesions present bilateral. Remaining integument unremarkable.  Vasculature:  Dorsalis Pedis pulse 1/4 bilateral. Posterior Tibial pulse  1/4 bilateral.  Capillary fill time <5 sec 1-5 bilateral. Diminished hair growth to the level of the digits. Temperature gradient within normal limits. Mild varicosities present bilateral. Trace edema present bilateral.   Neurology: The patient has diminished sensation measured with a 5.07/10g Semmes Weinstein Monofilament at all pedal sites bilateral . Vibratory sensation diminished bilateral with tuning fork. No Babinski sign present bilateral.   Musculoskeletal: No tenderness bilateral. Muscular strength 5/5 in all lower extremity muscular groups bilateral without pain on range of motion. No tenderness  with calf compression bilateral. AsymptomaticPes planus, bunion and hammertoes bilateral.  Assessment and Plan: Problem List Items Addressed This Visit    None    Visit Diagnoses    Dermatophytosis of nail    -  Primary   Pain of toe, unspecified laterality       Type 2 diabetes, controlled, with neuropathy ([place])         -Examined patient. -Discussed and educated patient on diabetic foot care, especially with regards to the vascular, neurological and musculoskeletal systems.  -Stressed the importance of good glycemic control  and the detriment of not controlling glucose levels in relation to the foot. -Mechanically debrided all nails 1-5 bilateral using sterile nail nipper and filed with dremel without incident  [employer] diabetic shoe order form was completed; office to contact primary care for approval / certification;  Office to arrange shoe fitting and dispensing. Patient will like to hand-deliver the form to her primary doctor which will make available for her once we have input the information into safe step. -Answered all patient questions -Patient to return  in 3 months for at risk foot care -Patient advised to call the office if any problems or questions arise in the meantime.  Landis Martins, DPM

## 2017-04-05 ENCOUNTER — Other Ambulatory Visit: Payer: Self-pay | Admitting: Cardiovascular Disease

## 2017-04-05 ENCOUNTER — Other Ambulatory Visit: Payer: Self-pay

## 2017-04-05 ENCOUNTER — Ambulatory Visit (HOSPITAL_BASED_OUTPATIENT_CLINIC_OR_DEPARTMENT_OTHER): Payer: Medicare Other

## 2017-04-05 ENCOUNTER — Ambulatory Visit (HOSPITAL_COMMUNITY)
Admission: RE | Admit: 2017-04-05 | Discharge: 2017-04-05 | Disposition: A | Discharge status: Home / Self Care | Payer: Medicare Other | Source: Ambulatory Visit | Attending: Cardiovascular Disease | Admitting: Cardiovascular Disease

## 2017-04-05 DIAGNOSIS — E119 Type 2 diabetes mellitus without complications: Secondary | ICD-10-CM | POA: Insufficient documentation

## 2017-04-05 DIAGNOSIS — R0600 Dyspnea, unspecified: Secondary | ICD-10-CM | POA: Diagnosis not present

## 2017-04-05 DIAGNOSIS — E785 Hyperlipidemia, unspecified: Secondary | ICD-10-CM | POA: Insufficient documentation

## 2017-04-05 DIAGNOSIS — I110 Hypertensive heart disease with heart failure: Secondary | ICD-10-CM | POA: Diagnosis not present

## 2017-04-05 DIAGNOSIS — M79604 Pain in right leg: Secondary | ICD-10-CM

## 2017-04-05 DIAGNOSIS — I4891 Unspecified atrial fibrillation: Secondary | ICD-10-CM | POA: Diagnosis not present

## 2017-04-05 DIAGNOSIS — R600 Localized edema: Secondary | ICD-10-CM | POA: Insufficient documentation

## 2017-04-05 DIAGNOSIS — I2510 Atherosclerotic heart disease of native coronary artery without angina pectoris: Secondary | ICD-10-CM | POA: Diagnosis not present

## 2017-04-05 DIAGNOSIS — I509 Heart failure, unspecified: Secondary | ICD-10-CM | POA: Diagnosis not present

## 2017-04-05 DIAGNOSIS — I071 Rheumatic tricuspid insufficiency: Secondary | ICD-10-CM | POA: Diagnosis not present

## 2017-04-11 DIAGNOSIS — R791 Abnormal coagulation profile: Secondary | ICD-10-CM | POA: Diagnosis not present

## 2017-04-16 ENCOUNTER — Ambulatory Visit: Payer: Medicare Other | Admitting: Cardiovascular Disease

## 2017-04-23 ENCOUNTER — Ambulatory Visit (INDEPENDENT_AMBULATORY_CARE_PROVIDER_SITE_OTHER): Payer: Medicare Other | Admitting: Cardiovascular Disease

## 2017-04-23 ENCOUNTER — Encounter: Payer: Self-pay | Admitting: Cardiovascular Disease

## 2017-04-23 VITALS — BP 125/72 | HR 77 | Ht 64.0 in | Wt 204.8 lb

## 2017-04-23 DIAGNOSIS — I482 Chronic atrial fibrillation, unspecified: Secondary | ICD-10-CM

## 2017-04-23 DIAGNOSIS — I5032 Chronic diastolic (congestive) heart failure: Secondary | ICD-10-CM

## 2017-04-23 DIAGNOSIS — E785 Hyperlipidemia, unspecified: Secondary | ICD-10-CM | POA: Diagnosis not present

## 2017-04-23 DIAGNOSIS — I2510 Atherosclerotic heart disease of native coronary artery without angina pectoris: Secondary | ICD-10-CM | POA: Diagnosis not present

## 2017-04-23 DIAGNOSIS — I10 Essential (primary) hypertension: Secondary | ICD-10-CM

## 2017-04-23 NOTE — Patient Instructions (Signed)
Medication Instructions: No changes   Follow-Up: Your physician wants you to follow-up in: 6 months with Dr. Fletcher Anon. You will receive a reminder letter in the mail two months in advance. If you don't receive a letter, please call our office to schedule the follow-up appointment.   Any Additional Special Instructions Will Be Listed Below (If Applicable).  *Please check on the cost of Xarelto and Eliquis with your insurance company and call the office.   If you need a refill on your cardiac medications before your next appointment, please call your pharmacy.

## 2017-04-23 NOTE — Progress Notes (Signed)
Cardiology Office Note   Date:  04/23/2017   ID:  JACKIE RUSSMAN, DOB 1935/03/25, MRN [id number]  PCP:  Nicoletta Dress, MD  Cardiologist:   Kathlyn Sacramento, MD   Chief Complaint  Patient presents with  . Follow-up      History of Present Illness: Rebekah Stewart is a 83 y.o. female who presents for  a followup visit regarding chronic atrial fibrillation, refractory hypertension and diastolic heart failure. She had a cardiac catheterization done in 2010 which showed mild to moderate three-vessel nonobstructive coronary artery disease. Worst stenosis was in the mid LAD which was estimated to be 50%. She has multiple chronic medical conditions including Diabetes and hyperlipidemia. Most recent nuclear stress test in June 2015 was normal.  She is known to have chronic bilateral leg edema  with stasis dermatitis.  She had lower extremity arterial Doppler recently for atypical leg pain. ABI was normal with no evidence of peripheral arterial disease.  She has been doing well overall with no chest pain or worsening shortness of breath. Leg edema has improved significantly. She continues to have difficulty achieving therapeutic INR with warfarin.  Past Medical History:  Diagnosis Date  . Arthritis of back    lumbar  . Atrial fibrillation ([place]) 2010  . Atrial fibrillation ([place])   . Atrial fibrillation ([place])   . Chronic diastolic heart failure ([place])   . Colonic polyp   . Compression fracture of thoracic vertebra ([facility])   . Congestive heart failure, unspecified   . Coronary artery disease    mild non obstructive disease per cardiac cath in 2010  . Degeneration of lumbar or lumbosacral intervertebral disc   . Diverticulosis   . DM2 (diabetes mellitus, type 2) ([place])   . Hemorrhoids   . HLD (hyperlipidemia)   . HTN (hypertension)    diagnosed when she was in her 48s. Refractory. No RAS by [place] in 2010  . Long-term (current) use of anticoagulants   . Lumbosacral spondylosis without  myelopathy   . Osteoarthrosis, unspecified whether generalized or localized, unspecified site   . Pain in thoracic spine   . Swelling of limb   . UTI (lower urinary tract infection)   . Varicose veins of lower extremities with other complications   . Vitamin D deficiency     Past Surgical History:  Procedure Laterality Date  . ADENOIDECTOMY    . CARDIAC CATHETERIZATION  2010   At [place]. Mild nonobstructive CAD  . CORONARY ANGIOPLASTY    . EYE SURGERY    . TONSILLECTOMY       Current Outpatient Prescriptions  Medication Sig Dispense Refill  . aspirin 81 MG tablet Take 81 mg by mouth daily.    Marland Kitchen atorvastatin (LIPITOR) 40 MG tablet Take 40 mg by mouth daily.    . benazepril (LOTENSIN) 20 MG tablet Take 20 mg by mouth daily.     . carvedilol (COREG) 25 MG tablet Take 25 mg by mouth 2 (two) times daily with a meal.    . Cholecalciferol (VITAMIN D) 2000 UNITS CAPS Take 1 capsule by mouth daily.    . fish oil-omega-3 fatty acids 1000 MG capsule Take 2 g by mouth 2 (two) times daily.     . furosemide (LASIX) 40 MG tablet Take 20 mg by mouth daily.     Marland Kitchen glucose blood (ACCU-CHEK AVIVA PLUS) test strip Check two times daily. Dx code E11.65 100 each 1  . isosorbide mononitrate (IMDUR) 60 MG  24 hr tablet Take 60 mg by mouth daily.      . metFORMIN (GLUCOPHAGE) 500 MG tablet Take 1 tablet (500 mg total) by mouth 3 (three) times daily. (Patient taking differently: Take 500 mg by mouth 2 (two) times daily with a meal. ) 270 tablet 1  . spironolactone (ALDACTONE) 25 MG tablet Take 25 mg by mouth daily.  3  . warfarin (COUMADIN) 4 MG tablet Take 4 mg by mouth as directed.    . warfarin (COUMADIN) 5 MG tablet TAKE 1 TABLET BY MOUTH DAILY AND 1.5 TABLETS ON SUNDAY AS DIRECTED  0   No current facility-administered medications for this visit.     Allergies:   Bee venom; Iodinated diagnostic agents; Penicillins; and Tape    Social History:  The patient  reports that she has never smoked.  She has never used smokeless tobacco. She reports that she does not drink alcohol or use drugs.   Family History:  The patient's family history includes Cancer in her sister; Heart attack in her father; Heart disease in her brother, father, mother, and unknown relative; Stroke in her mother.    ROS:  Please see the history of present illness.   Otherwise, review of systems are positive for none.   All other systems are reviewed and negative.    PHYSICAL EXAM: VS:  BP 125/72   Pulse 77   Ht 5' 4" (1.626 m)   Wt 204 lb 12.8 oz (92.9 kg)   BMI 35.15 kg/m  , BMI Body mass index is 35.15 kg/m. GEN: Well nourished, well developed, in no acute distress  HEENT: normal  Neck: no JVD, carotid bruits, or masses Cardiac: Irregularly irregular ; no murmurs, rubs, or gallops, trace edema Respiratory:  clear to auscultation bilaterally, normal work of breathing GI: soft, nontender, nondistended, + BS MS: no deformity or atrophy  Skin: warm and dry, no rash Neuro:  Strength and sensation are intact Psych: euthymic mood, full affect Dorsalis pedis is palpable bilaterally.  EKG:  EKG is  ordered today. EKG showed atrial fibrillation with ventricular rate of 77 bpm. Low voltage.   Recent Labs: No results found for requested labs within last 8760 hours.    Lipid Panel    Component Value Date/Time   LDLCALC 75 03/12/2014      Wt Readings from Last 3 Encounters:  04/23/17 204 lb 12.8 oz (92.9 kg)  10/30/16 206 lb (93.4 kg)  10/23/16 210 lb 9.6 oz (95.5 kg)       ASSESSMENT AND PLAN:  1.  Chronic atrial fibrillation:  Ventricular rate is reasonably controlled on carvedilol.  She is on long-term anticoagulation with warfarin. I again suggested stopping aspirin to minimize the risk of bleeding but she is worried given that her INR is frequently below 2. I discussed with her the option of switching to a NOAC but she is extremely hesitant to do so. She is also worried about the cost. I  asked her to check with her insurance company.  2. Essential hypertension:  Blood pressure is well controlled on current medications.  3. Coronary artery disease involving native coronary arteries without angina: She has no clear anginal symptoms. . Most recent stress test was in 2015 and was normal.  4. Hyperlipidemia: Continue treatment with atorvastatin.   5. Chronic diastolic heart failure: She appears to be euvolemic. Recent echocardiogram showed normal LV systolic function with minimal pulmonary hypertension.  6. Chronic venous insufficiency: This is likely the reason for her  chronic leg edema.  Disposition:   FU with me in 6 months  Signed,  Kathlyn Sacramento, MD  04/23/2017 9:54 AM    [place]

## 2017-04-26 DIAGNOSIS — Z7901 Long term (current) use of anticoagulants: Secondary | ICD-10-CM | POA: Diagnosis not present

## 2017-05-03 DIAGNOSIS — R791 Abnormal coagulation profile: Secondary | ICD-10-CM | POA: Diagnosis not present

## 2017-05-08 ENCOUNTER — Ambulatory Visit: Payer: Medicare Other | Admitting: *Deleted

## 2017-05-08 ENCOUNTER — Other Ambulatory Visit: Payer: Medicare Other

## 2017-05-08 DIAGNOSIS — E1140 Type 2 diabetes mellitus with diabetic neuropathy, unspecified: Secondary | ICD-10-CM

## 2017-05-08 NOTE — Progress Notes (Signed)
Patient ID: Rebekah Stewart, female   DOB: 08/15/1935, 82 y.o.   MRN: [id number]   Patient presents for diabetic shoe pick up, shoes are tried on for good patient feels that they are just a bit to short.  There was some discussions at measurement as to whether or not to go up to a 9.5.  We will return these and order the 9.5 wide  We will call patient whe they arrive.

## 2017-05-09 DIAGNOSIS — R110 Nausea: Secondary | ICD-10-CM | POA: Diagnosis not present

## 2017-05-09 DIAGNOSIS — E1140 Type 2 diabetes mellitus with diabetic neuropathy, unspecified: Secondary | ICD-10-CM | POA: Diagnosis not present

## 2017-05-09 DIAGNOSIS — R791 Abnormal coagulation profile: Secondary | ICD-10-CM | POA: Diagnosis not present

## 2017-05-09 DIAGNOSIS — I110 Hypertensive heart disease with heart failure: Secondary | ICD-10-CM | POA: Diagnosis not present

## 2017-05-16 DIAGNOSIS — R791 Abnormal coagulation profile: Secondary | ICD-10-CM | POA: Diagnosis not present

## 2017-05-22 DIAGNOSIS — R791 Abnormal coagulation profile: Secondary | ICD-10-CM | POA: Diagnosis not present

## 2017-05-26 DIAGNOSIS — J0100 Acute maxillary sinusitis, unspecified: Secondary | ICD-10-CM | POA: Diagnosis not present

## 2017-05-26 DIAGNOSIS — J209 Acute bronchitis, unspecified: Secondary | ICD-10-CM | POA: Diagnosis not present

## 2017-05-26 DIAGNOSIS — R05 Cough: Secondary | ICD-10-CM | POA: Diagnosis not present

## 2017-05-31 DIAGNOSIS — R791 Abnormal coagulation profile: Secondary | ICD-10-CM | POA: Diagnosis not present

## 2017-06-07 DIAGNOSIS — R791 Abnormal coagulation profile: Secondary | ICD-10-CM | POA: Diagnosis not present

## 2017-06-13 ENCOUNTER — Telehealth: Payer: Self-pay | Admitting: Sports Medicine

## 2017-06-13 DIAGNOSIS — R791 Abnormal coagulation profile: Secondary | ICD-10-CM | POA: Diagnosis not present

## 2017-06-13 NOTE — Telephone Encounter (Signed)
I received a call asking me to call and speak to Rebekah Stewart. If she could, please call me back. Thank you.

## 2017-06-17 ENCOUNTER — Ambulatory Visit (INDEPENDENT_AMBULATORY_CARE_PROVIDER_SITE_OTHER): Payer: Medicare Other | Admitting: Ophthalmology

## 2017-06-20 DIAGNOSIS — E785 Hyperlipidemia, unspecified: Secondary | ICD-10-CM | POA: Diagnosis not present

## 2017-06-20 DIAGNOSIS — Z79899 Other long term (current) drug therapy: Secondary | ICD-10-CM | POA: Diagnosis not present

## 2017-06-20 DIAGNOSIS — I110 Hypertensive heart disease with heart failure: Secondary | ICD-10-CM | POA: Diagnosis not present

## 2017-06-20 DIAGNOSIS — I4891 Unspecified atrial fibrillation: Secondary | ICD-10-CM | POA: Diagnosis not present

## 2017-06-20 DIAGNOSIS — Z6836 Body mass index (BMI) 36.0-36.9, adult: Secondary | ICD-10-CM | POA: Diagnosis not present

## 2017-06-20 DIAGNOSIS — E559 Vitamin D deficiency, unspecified: Secondary | ICD-10-CM | POA: Diagnosis not present

## 2017-06-20 DIAGNOSIS — R791 Abnormal coagulation profile: Secondary | ICD-10-CM | POA: Diagnosis not present

## 2017-06-20 DIAGNOSIS — E1140 Type 2 diabetes mellitus with diabetic neuropathy, unspecified: Secondary | ICD-10-CM | POA: Diagnosis not present

## 2017-06-20 DIAGNOSIS — J069 Acute upper respiratory infection, unspecified: Secondary | ICD-10-CM | POA: Diagnosis not present

## 2017-06-28 ENCOUNTER — Encounter (INDEPENDENT_AMBULATORY_CARE_PROVIDER_SITE_OTHER): Payer: Self-pay

## 2017-06-28 ENCOUNTER — Ambulatory Visit (INDEPENDENT_AMBULATORY_CARE_PROVIDER_SITE_OTHER): Payer: Medicare Other | Admitting: Sports Medicine

## 2017-06-28 DIAGNOSIS — R791 Abnormal coagulation profile: Secondary | ICD-10-CM | POA: Diagnosis not present

## 2017-06-28 DIAGNOSIS — I4891 Unspecified atrial fibrillation: Secondary | ICD-10-CM | POA: Diagnosis not present

## 2017-06-28 DIAGNOSIS — R600 Localized edema: Secondary | ICD-10-CM | POA: Diagnosis not present

## 2017-06-28 DIAGNOSIS — Z6837 Body mass index (BMI) 37.0-37.9, adult: Secondary | ICD-10-CM | POA: Diagnosis not present

## 2017-06-28 DIAGNOSIS — Z9181 History of falling: Secondary | ICD-10-CM | POA: Diagnosis not present

## 2017-06-28 DIAGNOSIS — M722 Plantar fascial fibromatosis: Secondary | ICD-10-CM

## 2017-06-28 DIAGNOSIS — M19072 Primary osteoarthritis, left ankle and foot: Secondary | ICD-10-CM | POA: Diagnosis not present

## 2017-06-28 DIAGNOSIS — E1140 Type 2 diabetes mellitus with diabetic neuropathy, unspecified: Secondary | ICD-10-CM

## 2017-06-28 DIAGNOSIS — B351 Tinea unguium: Secondary | ICD-10-CM

## 2017-06-28 DIAGNOSIS — M79676 Pain in unspecified toe(s): Secondary | ICD-10-CM | POA: Diagnosis not present

## 2017-06-28 DIAGNOSIS — J069 Acute upper respiratory infection, unspecified: Secondary | ICD-10-CM | POA: Diagnosis not present

## 2017-06-28 NOTE — Patient Instructions (Signed)

## 2017-06-28 NOTE — Progress Notes (Signed)
Subjective: Rebekah Stewart is a 82 y.o. female patient with history of diabetes who returns to office today complaining of long, painful nails  while ambulating in shoes; unable to trim. Patient states that the glucose reading this morning was not recorded. Patient states that she is getting better, was on a antibiotic for infection and ulcer in her mouth. Patient denies any other pedal complaints.  Patient Active Problem List   Diagnosis Date Noted  . Varicose veins of lower extremities with complications, bilateral 10/30/2016  . Pure hypercholesterolemia 05/21/2014  . Bilateral leg pain 11/26/2012  . Chronic diastolic heart failure ([place])   . Atrial fibrillation ([place])   . HTN (hypertension)   . Coronary artery disease    Current Outpatient Prescriptions on File Prior to Visit  Medication Sig Dispense Refill  . aspirin 81 MG tablet Take 81 mg by mouth daily.    Marland Kitchen atorvastatin (LIPITOR) 40 MG tablet Take 40 mg by mouth daily.    . benazepril (LOTENSIN) 20 MG tablet Take 20 mg by mouth daily.     . carvedilol (COREG) 25 MG tablet Take 25 mg by mouth 2 (two) times daily with a meal.    . Cholecalciferol (VITAMIN D) 2000 UNITS CAPS Take 1 capsule by mouth daily.    . fish oil-omega-3 fatty acids 1000 MG capsule Take 2 g by mouth 2 (two) times daily.     . furosemide (LASIX) 40 MG tablet Take 20 mg by mouth daily.     Marland Kitchen glucose blood (ACCU-CHEK AVIVA PLUS) test strip Check two times daily. Dx code E11.65 100 each 1  . isosorbide mononitrate (IMDUR) 60 MG 24 hr tablet Take 60 mg by mouth daily.      . metFORMIN (GLUCOPHAGE) 500 MG tablet Take 1 tablet (500 mg total) by mouth 3 (three) times daily. (Patient taking differently: Take 500 mg by mouth 2 (two) times daily with a meal. ) 270 tablet 1  . spironolactone (ALDACTONE) 25 MG tablet Take 25 mg by mouth daily.  3  . warfarin (COUMADIN) 4 MG tablet Take 4 mg by mouth as directed.    . warfarin (COUMADIN) 5 MG tablet TAKE 1 TABLET BY MOUTH DAILY  AND 1.5 TABLETS ON SUNDAY AS DIRECTED  0   No current facility-administered medications on file prior to visit.    Allergies  Allergen Reactions  . Bee Venom Shortness Of Breath and Swelling  . Iodinated Diagnostic Agents Itching  . Penicillins Swelling  . Tape     Pulls the skin    No results found for this or any previous visit (from the past 2160 hour(s)).  Objective: General: Patient is awake, alert, and oriented x 3 and in no acute distress.  Integument: Skin is warm, dry and supple bilateral. Nails are tender, long, thickened and dystrophic with subungual debris, consistent with onychomycosis, 1-5 bilateral.  No acute signs of infection. No open lesions or preulcerative lesions present bilateral. Remaining integument unremarkable.  Vasculature:  Dorsalis Pedis pulse 1/4 bilateral. Posterior Tibial pulse  1/4 bilateral.  Capillary fill time <5 sec 1-5 bilateral. Diminished hair growth to the level of the digits. Temperature gradient within normal limits. Mild varicosities present bilateral. Trace edema present bilateral.   Neurology: The patient has diminished sensation measured with a 5.07/10g Semmes Weinstein Monofilament at all pedal sites bilateral . Vibratory sensation diminished bilateral with tuning fork. No Babinski sign present bilateral.   Musculoskeletal: No tenderness bilateral. Muscular strength 5/5 in all lower extremity  muscular groups bilateral without pain on range of motion. No tenderness with calf compression bilateral. AsymptomaticPes planus, bunion and hammertoes bilateral.  Assessment and Plan: Problem List Items Addressed This Visit    None    Visit Diagnoses    Type 2 diabetes, controlled, with neuropathy ([place])    -  Primary   Dermatophytosis of nail       Relevant Medications   clotrimazole (MYCELEX) 10 MG troche   Pain of toe, unspecified laterality         -Examined patient. -Discussed and educated patient on diabetic foot care, especially with  regards to the vascular, neurological and musculoskeletal systems.  -Stressed the importance of good glycemic control and the detriment of not controlling glucose levels in relation to the foot. -Mechanically debrided all nails 1-5 bilateral using sterile nail nipper and filed with dremel without incident  -Diabetic shoes were properly fitted and dispensed by medical assistant with wear and break in instructions given -Answered all patient questions -Patient to return  in 3 months for at risk foot care -Patient advised to call the office if any problems or questions arise in the meantime.  Landis Martins, DPM

## 2017-07-01 DIAGNOSIS — B353 Tinea pedis: Secondary | ICD-10-CM | POA: Diagnosis not present

## 2017-07-01 DIAGNOSIS — B351 Tinea unguium: Secondary | ICD-10-CM | POA: Diagnosis not present

## 2017-07-03 ENCOUNTER — Encounter (INDEPENDENT_AMBULATORY_CARE_PROVIDER_SITE_OTHER): Payer: Medicare Other | Admitting: Ophthalmology

## 2017-07-03 DIAGNOSIS — H35033 Hypertensive retinopathy, bilateral: Secondary | ICD-10-CM

## 2017-07-03 DIAGNOSIS — H2511 Age-related nuclear cataract, right eye: Secondary | ICD-10-CM | POA: Diagnosis not present

## 2017-07-03 DIAGNOSIS — H35373 Puckering of macula, bilateral: Secondary | ICD-10-CM

## 2017-07-03 DIAGNOSIS — H43813 Vitreous degeneration, bilateral: Secondary | ICD-10-CM | POA: Diagnosis not present

## 2017-07-03 DIAGNOSIS — H348322 Tributary (branch) retinal vein occlusion, left eye, stable: Secondary | ICD-10-CM | POA: Diagnosis not present

## 2017-07-03 DIAGNOSIS — I10 Essential (primary) hypertension: Secondary | ICD-10-CM

## 2017-07-04 DIAGNOSIS — R791 Abnormal coagulation profile: Secondary | ICD-10-CM | POA: Diagnosis not present

## 2017-07-10 DIAGNOSIS — I4891 Unspecified atrial fibrillation: Secondary | ICD-10-CM | POA: Diagnosis not present

## 2017-07-10 DIAGNOSIS — R600 Localized edema: Secondary | ICD-10-CM | POA: Diagnosis not present

## 2017-07-10 DIAGNOSIS — R791 Abnormal coagulation profile: Secondary | ICD-10-CM | POA: Diagnosis not present

## 2017-07-16 DIAGNOSIS — R791 Abnormal coagulation profile: Secondary | ICD-10-CM | POA: Diagnosis not present

## 2017-07-23 DIAGNOSIS — R791 Abnormal coagulation profile: Secondary | ICD-10-CM | POA: Diagnosis not present

## 2017-07-30 DIAGNOSIS — R791 Abnormal coagulation profile: Secondary | ICD-10-CM | POA: Diagnosis not present

## 2017-08-07 DIAGNOSIS — R791 Abnormal coagulation profile: Secondary | ICD-10-CM | POA: Diagnosis not present

## 2017-08-14 DIAGNOSIS — R791 Abnormal coagulation profile: Secondary | ICD-10-CM | POA: Diagnosis not present

## 2017-08-21 DIAGNOSIS — R791 Abnormal coagulation profile: Secondary | ICD-10-CM | POA: Diagnosis not present

## 2017-08-28 DIAGNOSIS — R791 Abnormal coagulation profile: Secondary | ICD-10-CM | POA: Diagnosis not present

## 2017-09-03 ENCOUNTER — Telehealth: Payer: Self-pay | Admitting: Cardiovascular Disease

## 2017-09-03 ENCOUNTER — Encounter: Payer: Self-pay | Admitting: Cardiovascular Disease

## 2017-09-03 MED ORDER — APIXABAN 5 MG PO TABS: 5.0000 mg | ORAL_TABLET | Freq: Two times a day (BID) | ORAL | 5 refills | Status: DC

## 2017-09-03 NOTE — Telephone Encounter (Signed)
Pt wanted to change from Warfarin but due to the high cost of the other blood thinners she could not afford it, she heard she can get a card or something to get Eliquis for $10 a month-pls advise

## 2017-09-03 NOTE — Telephone Encounter (Signed)
Patient called with pharmacist's recommendations. She would like to check on costs with pharmacy. Sent Rx to [facility] per patient request. Asked that she call us back if she is able to afford Eliquis so we can give proper instructions on transitioning from warfarin to NOAC

## 2017-09-03 NOTE — Telephone Encounter (Signed)
Only way to know exact co-pay if by procesing a prescription for Xarelto/Eliquis. On average medicare co-pay if ~25% of cost. Xarelto, Eliquis and Predaxa are not available generic yet.   Eliquis 51m BID will be appropriate if patient agree with therapy.

## 2017-09-03 NOTE — Telephone Encounter (Signed)
Returned call to patient. She reports inconsistent/non-therapeutic INR readings checked by PCP. Explained that co-pay cards will not work with Medicare insurance(s). Explained that patient assistance is an option but she will need to have spent at least 3% of her income on out of pocket meds. She is unsure if she will have spent that as her meds are generic.   Will route to pharmacy staff for assistance

## 2017-09-04 DIAGNOSIS — R791 Abnormal coagulation profile: Secondary | ICD-10-CM | POA: Diagnosis not present

## 2017-09-12 DIAGNOSIS — R791 Abnormal coagulation profile: Secondary | ICD-10-CM | POA: Diagnosis not present

## 2017-09-16 DIAGNOSIS — I110 Hypertensive heart disease with heart failure: Secondary | ICD-10-CM | POA: Diagnosis not present

## 2017-09-16 DIAGNOSIS — R791 Abnormal coagulation profile: Secondary | ICD-10-CM | POA: Diagnosis not present

## 2017-09-16 DIAGNOSIS — Z79899 Other long term (current) drug therapy: Secondary | ICD-10-CM | POA: Diagnosis not present

## 2017-09-16 DIAGNOSIS — E1140 Type 2 diabetes mellitus with diabetic neuropathy, unspecified: Secondary | ICD-10-CM | POA: Diagnosis not present

## 2017-09-16 DIAGNOSIS — E559 Vitamin D deficiency, unspecified: Secondary | ICD-10-CM | POA: Diagnosis not present

## 2017-09-16 DIAGNOSIS — Z6836 Body mass index (BMI) 36.0-36.9, adult: Secondary | ICD-10-CM | POA: Diagnosis not present

## 2017-09-16 DIAGNOSIS — Z1331 Encounter for screening for depression: Secondary | ICD-10-CM | POA: Diagnosis not present

## 2017-09-16 DIAGNOSIS — Z9181 History of falling: Secondary | ICD-10-CM | POA: Diagnosis not present

## 2017-09-16 DIAGNOSIS — I4891 Unspecified atrial fibrillation: Secondary | ICD-10-CM | POA: Diagnosis not present

## 2017-09-16 DIAGNOSIS — E785 Hyperlipidemia, unspecified: Secondary | ICD-10-CM | POA: Diagnosis not present

## 2017-09-20 ENCOUNTER — Encounter: Payer: Self-pay | Admitting: Sports Medicine

## 2017-09-20 ENCOUNTER — Ambulatory Visit (INDEPENDENT_AMBULATORY_CARE_PROVIDER_SITE_OTHER): Payer: Medicare Other | Admitting: Sports Medicine

## 2017-09-20 DIAGNOSIS — R791 Abnormal coagulation profile: Secondary | ICD-10-CM | POA: Diagnosis not present

## 2017-09-20 DIAGNOSIS — E1140 Type 2 diabetes mellitus with diabetic neuropathy, unspecified: Secondary | ICD-10-CM | POA: Diagnosis not present

## 2017-09-20 DIAGNOSIS — M79676 Pain in unspecified toe(s): Secondary | ICD-10-CM

## 2017-09-20 DIAGNOSIS — B351 Tinea unguium: Secondary | ICD-10-CM

## 2017-09-20 NOTE — Progress Notes (Signed)
Subjective: Rebekah Stewart is a 82 y.o. female patient with history of diabetes who returns to office today complaining of long, painful nails  while ambulating in shoes; unable to trim. Patient states that the glucose reading this morning was not recorded, never checks. Patient states that she will see her PCP on Monday. Patient denies any other pedal complaints.  Last A1c 7  Patient Active Problem List   Diagnosis Date Noted  . Varicose veins of lower extremities with complications, bilateral 10/30/2016  . Pure hypercholesterolemia 05/21/2014  . Bilateral leg pain 11/26/2012  . Chronic diastolic heart failure ([place])   . Atrial fibrillation ([place])   . HTN (hypertension)   . Coronary artery disease    Current Outpatient Medications on File Prior to Visit  Medication Sig Dispense Refill  . apixaban (ELIQUIS) 5 MG TABS tablet Take 1 tablet (5 mg total) by mouth 2 (two) times daily. 60 tablet 5  . aspirin 81 MG tablet Take 81 mg by mouth daily.    Marland Kitchen atorvastatin (LIPITOR) 40 MG tablet Take 40 mg by mouth daily.    . benazepril (LOTENSIN) 20 MG tablet Take 20 mg by mouth daily.     . carvedilol (COREG) 25 MG tablet Take 25 mg by mouth 2 (two) times daily with a meal.    . chlorhexidine (PERIDEX) 0.12 % solution     . Cholecalciferol (VITAMIN D) 2000 UNITS CAPS Take 1 capsule by mouth daily.    . clotrimazole (MYCELEX) 10 MG troche     . fish oil-omega-3 fatty acids 1000 MG capsule Take 2 g by mouth 2 (two) times daily.     . furosemide (LASIX) 40 MG tablet Take 20 mg by mouth daily.     Marland Kitchen glucose blood (ACCU-CHEK AVIVA PLUS) test strip Check two times daily. Dx code E11.65 100 each 1  . isosorbide mononitrate (IMDUR) 60 MG 24 hr tablet Take 60 mg by mouth daily.      Marland Kitchen lidocaine (XYLOCAINE) 2 % solution     . metFORMIN (GLUCOPHAGE) 500 MG tablet Take 1 tablet (500 mg total) by mouth 3 (three) times daily. (Patient taking differently: Take 500 mg by mouth 2 (two) times daily with a meal. ) 270  tablet 1  . spironolactone (ALDACTONE) 25 MG tablet Take 25 mg by mouth daily.  3  . warfarin (COUMADIN) 4 MG tablet Take 4 mg by mouth as directed.    . warfarin (COUMADIN) 5 MG tablet TAKE 1 TABLET BY MOUTH DAILY AND 1.5 TABLETS ON SUNDAY AS DIRECTED  0   No current facility-administered medications on file prior to visit.    Allergies  Allergen Reactions  . Bee Venom Shortness Of Breath and Swelling  . Iodinated Diagnostic Agents Itching  . Penicillins Swelling  . Tape     Pulls the skin    No results found for this or any previous visit (from the past 2160 hour(s)).  Objective: General: Patient is awake, alert, and oriented x 3 and in no acute distress.  Integument: Skin is warm, dry and supple bilateral. Nails are tender, long, thickened and dystrophic with subungual debris, consistent with onychomycosis, 1-5 bilateral.  No acute signs of infection. No open lesions or preulcerative lesions present bilateral. Remaining integument unremarkable.  Vasculature:  Dorsalis Pedis pulse 1/4 bilateral. Posterior Tibial pulse  1/4 bilateral.  Capillary fill time <5 sec 1-5 bilateral. Diminished hair growth to the level of the digits. Temperature gradient within normal limits. Mild varicosities present  bilateral. Trace edema present bilateral.   Neurology: The patient has diminished sensation measured with a 5.07/10g Semmes Weinstein Monofilament at all pedal sites bilateral . Vibratory sensation diminished bilateral with tuning fork. No Babinski sign present bilateral.   Musculoskeletal: No tenderness bilateral. Muscular strength 5/5 in all lower extremity muscular groups bilateral without pain on range of motion. No tenderness with calf compression bilateral. AsymptomaticPes planus, bunion and hammertoes bilateral.  Assessment and Plan: Problem List Items Addressed This Visit    None    Visit Diagnoses    Type 2 diabetes, controlled, with neuropathy ([place])    -  Primary    Dermatophytosis of nail       Pain of toe, unspecified laterality         -Examined patient. -Discussed and educated patient on diabetic foot care, especially with regards to the vascular, neurological and musculoskeletal systems.  -Stressed the importance of good glycemic control and the detriment of not controlling glucose levels in relation to the foot. -Mechanically debrided all nails 1-5 bilateral using sterile nail nipper and filed with dremel without incident  -Continue with diabetic shoes -Answered all patient questions -Patient to return  in 3 months for at risk foot care -Patient advised to call the office if any problems or questions arise in the meantime.  Landis Martins, DPM

## 2017-09-27 DIAGNOSIS — R791 Abnormal coagulation profile: Secondary | ICD-10-CM | POA: Diagnosis not present

## 2017-10-04 DIAGNOSIS — R791 Abnormal coagulation profile: Secondary | ICD-10-CM | POA: Diagnosis not present

## 2017-10-18 DIAGNOSIS — R791 Abnormal coagulation profile: Secondary | ICD-10-CM | POA: Diagnosis not present

## 2017-10-25 DIAGNOSIS — R791 Abnormal coagulation profile: Secondary | ICD-10-CM | POA: Diagnosis not present

## 2017-11-01 DIAGNOSIS — R791 Abnormal coagulation profile: Secondary | ICD-10-CM | POA: Diagnosis not present

## 2017-11-08 DIAGNOSIS — R791 Abnormal coagulation profile: Secondary | ICD-10-CM | POA: Diagnosis not present

## 2017-11-14 DIAGNOSIS — L97921 Non-pressure chronic ulcer of unspecified part of left lower leg limited to breakdown of skin: Secondary | ICD-10-CM | POA: Diagnosis not present

## 2017-11-14 DIAGNOSIS — L97911 Non-pressure chronic ulcer of unspecified part of right lower leg limited to breakdown of skin: Secondary | ICD-10-CM | POA: Diagnosis not present

## 2017-11-14 DIAGNOSIS — L300 Nummular dermatitis: Secondary | ICD-10-CM | POA: Diagnosis not present

## 2017-11-15 DIAGNOSIS — R791 Abnormal coagulation profile: Secondary | ICD-10-CM | POA: Diagnosis not present

## 2017-11-20 DIAGNOSIS — R600 Localized edema: Secondary | ICD-10-CM | POA: Diagnosis not present

## 2017-11-20 DIAGNOSIS — I8310 Varicose veins of unspecified lower extremity with inflammation: Secondary | ICD-10-CM | POA: Diagnosis not present

## 2017-11-20 DIAGNOSIS — L97921 Non-pressure chronic ulcer of unspecified part of left lower leg limited to breakdown of skin: Secondary | ICD-10-CM | POA: Diagnosis not present

## 2017-11-20 DIAGNOSIS — L300 Nummular dermatitis: Secondary | ICD-10-CM | POA: Diagnosis not present

## 2017-11-26 DIAGNOSIS — L300 Nummular dermatitis: Secondary | ICD-10-CM | POA: Diagnosis not present

## 2017-11-26 DIAGNOSIS — L97921 Non-pressure chronic ulcer of unspecified part of left lower leg limited to breakdown of skin: Secondary | ICD-10-CM | POA: Diagnosis not present

## 2017-11-26 DIAGNOSIS — L821 Other seborrheic keratosis: Secondary | ICD-10-CM | POA: Diagnosis not present

## 2017-11-29 DIAGNOSIS — R791 Abnormal coagulation profile: Secondary | ICD-10-CM | POA: Diagnosis not present

## 2017-12-03 ENCOUNTER — Ambulatory Visit: Payer: Medicare Other | Admitting: Cardiovascular Disease

## 2017-12-06 DIAGNOSIS — R791 Abnormal coagulation profile: Secondary | ICD-10-CM | POA: Diagnosis not present

## 2017-12-13 DIAGNOSIS — E1140 Type 2 diabetes mellitus with diabetic neuropathy, unspecified: Secondary | ICD-10-CM | POA: Diagnosis not present

## 2017-12-13 DIAGNOSIS — Z79899 Other long term (current) drug therapy: Secondary | ICD-10-CM | POA: Diagnosis not present

## 2017-12-13 DIAGNOSIS — E785 Hyperlipidemia, unspecified: Secondary | ICD-10-CM | POA: Diagnosis not present

## 2017-12-13 DIAGNOSIS — E559 Vitamin D deficiency, unspecified: Secondary | ICD-10-CM | POA: Diagnosis not present

## 2017-12-13 DIAGNOSIS — E1165 Type 2 diabetes mellitus with hyperglycemia: Secondary | ICD-10-CM | POA: Diagnosis not present

## 2017-12-16 DIAGNOSIS — Z2821 Immunization not carried out because of patient refusal: Secondary | ICD-10-CM | POA: Diagnosis not present

## 2017-12-16 DIAGNOSIS — E785 Hyperlipidemia, unspecified: Secondary | ICD-10-CM | POA: Diagnosis not present

## 2017-12-16 DIAGNOSIS — E559 Vitamin D deficiency, unspecified: Secondary | ICD-10-CM | POA: Diagnosis not present

## 2017-12-16 DIAGNOSIS — M8589 Other specified disorders of bone density and structure, multiple sites: Secondary | ICD-10-CM | POA: Diagnosis not present

## 2017-12-16 DIAGNOSIS — Z1231 Encounter for screening mammogram for malignant neoplasm of breast: Secondary | ICD-10-CM | POA: Diagnosis not present

## 2017-12-16 DIAGNOSIS — I110 Hypertensive heart disease with heart failure: Secondary | ICD-10-CM | POA: Diagnosis not present

## 2017-12-16 DIAGNOSIS — I4891 Unspecified atrial fibrillation: Secondary | ICD-10-CM | POA: Diagnosis not present

## 2017-12-16 DIAGNOSIS — E1140 Type 2 diabetes mellitus with diabetic neuropathy, unspecified: Secondary | ICD-10-CM | POA: Diagnosis not present

## 2017-12-18 DIAGNOSIS — L853 Xerosis cutis: Secondary | ICD-10-CM | POA: Diagnosis not present

## 2017-12-18 DIAGNOSIS — L98499 Non-pressure chronic ulcer of skin of other sites with unspecified severity: Secondary | ICD-10-CM | POA: Diagnosis not present

## 2017-12-18 DIAGNOSIS — L308 Other specified dermatitis: Secondary | ICD-10-CM | POA: Diagnosis not present

## 2017-12-18 DIAGNOSIS — L821 Other seborrheic keratosis: Secondary | ICD-10-CM | POA: Diagnosis not present

## 2017-12-20 ENCOUNTER — Ambulatory Visit: Payer: Medicare Other | Admitting: Sports Medicine

## 2017-12-20 DIAGNOSIS — R791 Abnormal coagulation profile: Secondary | ICD-10-CM | POA: Diagnosis not present

## 2017-12-24 ENCOUNTER — Ambulatory Visit (INDEPENDENT_AMBULATORY_CARE_PROVIDER_SITE_OTHER): Payer: Medicare Other | Admitting: Cardiovascular Disease

## 2017-12-24 ENCOUNTER — Encounter: Payer: Self-pay | Admitting: Cardiovascular Disease

## 2017-12-24 VITALS — BP 144/74 | HR 91 | Ht 64.0 in | Wt 214.0 lb

## 2017-12-24 DIAGNOSIS — I10 Essential (primary) hypertension: Secondary | ICD-10-CM

## 2017-12-24 DIAGNOSIS — I2510 Atherosclerotic heart disease of native coronary artery without angina pectoris: Secondary | ICD-10-CM

## 2017-12-24 DIAGNOSIS — I482 Chronic atrial fibrillation, unspecified: Secondary | ICD-10-CM

## 2017-12-24 DIAGNOSIS — E785 Hyperlipidemia, unspecified: Secondary | ICD-10-CM

## 2017-12-24 DIAGNOSIS — I890 Lymphedema, not elsewhere classified: Secondary | ICD-10-CM

## 2017-12-24 NOTE — Progress Notes (Signed)
Cardiology Office Note   Date:  12/24/2017   ID:  Rebekah Stewart, DOB 1934-11-13, MRN [id number]  PCP:  Nicoletta Dress, MD  Cardiologist:   Kathlyn Sacramento, MD   No chief complaint on file.     History of Present Illness: Rebekah Stewart is a 83 y.o. female who presents for  a followup visit regarding chronic atrial fibrillation, refractory hypertension and diastolic heart failure. She had a cardiac catheterization done in 2010 which showed mild to moderate three-vessel nonobstructive coronary artery disease. Worst stenosis was in the mid LAD which was estimated to be 50%. She has multiple chronic medical conditions including Diabetes and hyperlipidemia. Most recent nuclear stress test in June 2015 was normal.  She is known to have chronic bilateral leg edema  with stasis dermatitis.  She has lymphedema. Lower extremity arterial Doppler in July showed normal ABI bilaterally. The patient could not afford Eliquis due to cost and she is back on warfarin.  He has been doing reasonably well from a cardiac standpoint with no chest pain or worsening dyspnea.  She developed small ulcers on the left lateral leg above the ankle.   Past Medical History:  Diagnosis Date  . Arthritis of back    lumbar  . Atrial fibrillation ([place]) 2010  . Atrial fibrillation ([place])   . Atrial fibrillation ([place])   . Chronic diastolic heart failure ([place])   . Colonic polyp   . Compression fracture of thoracic vertebra (HCC)   . Congestive heart failure, unspecified   . Coronary artery disease    mild non obstructive disease per cardiac cath in 2010  . Degeneration of lumbar or lumbosacral intervertebral disc   . Diverticulosis   . DM2 (diabetes mellitus, type 2) ([place])   . Hemorrhoids   . HLD (hyperlipidemia)   . HTN (hypertension)    diagnosed when she was in her 64s. Refractory. No RAS by [place] in 2010  . Long-term (current) use of anticoagulants   . Lumbosacral spondylosis without myelopathy   .  Osteoarthrosis, unspecified whether generalized or localized, unspecified site   . Pain in thoracic spine   . Swelling of limb   . UTI (lower urinary tract infection)   . Varicose veins of lower extremities with other complications   . Vitamin D deficiency     Past Surgical History:  Procedure Laterality Date  . ADENOIDECTOMY    . CARDIAC CATHETERIZATION  2010   At [place]. Mild nonobstructive CAD  . CORONARY ANGIOPLASTY    . EYE SURGERY    . TONSILLECTOMY       Current Outpatient Medications  Medication Sig Dispense Refill  . atorvastatin (LIPITOR) 40 MG tablet Take 40 mg by mouth daily.    . benazepril (LOTENSIN) 20 MG tablet Take 20 mg by mouth daily.     . carvedilol (COREG) 25 MG tablet Take 25 mg by mouth 2 (two) times daily with a meal.    . Cholecalciferol (VITAMIN D) 2000 UNITS CAPS Take 1 capsule by mouth daily.    . fish oil-omega-3 fatty acids 1000 MG capsule Take 2 g by mouth 2 (two) times daily.     . furosemide (LASIX) 40 MG tablet Take 20 mg by mouth daily.     Marland Kitchen glucose blood (ACCU-CHEK AVIVA PLUS) test strip Check two times daily. Dx code E11.65 100 each 1  . isosorbide mononitrate (IMDUR) 60 MG 24 hr tablet Take 60 mg by mouth daily.      Marland Kitchen  lidocaine (XYLOCAINE) 2 % solution     . metFORMIN (GLUCOPHAGE) 500 MG tablet Take 1 tablet (500 mg total) by mouth 3 (three) times daily. (Patient taking differently: Take 500 mg by mouth 2 (two) times daily with a meal. ) 270 tablet 1  . mupirocin ointment (BACTROBAN) 2 % Apply 1 application topically daily.    Marland Kitchen spironolactone (ALDACTONE) 25 MG tablet Take 25 mg by mouth daily.  3  . warfarin (COUMADIN) 4 MG tablet Take 4 mg by mouth as directed.    . warfarin (COUMADIN) 5 MG tablet TAKE 1 TABLET BY MOUTH DAILY AND 1.5 TABLETS ON SUNDAY AS DIRECTED  0   No current facility-administered medications for this visit.     Allergies:   Bee venom; Iodinated diagnostic agents; Penicillins; and Tape    Social History:   The patient  reports that she has never smoked. She has never used smokeless tobacco. She reports that she does not drink alcohol or use drugs.   Family History:  The patient's family history includes Cancer in her sister; Heart attack in her father; Heart disease in her brother, father, mother, and unknown relative; Stroke in her mother.    ROS:  Please see the history of present illness.   Otherwise, review of systems are positive for none.   All other systems are reviewed and negative.    PHYSICAL EXAM: VS:  BP (!) 144/74   Pulse 91   Ht _0  (1.626 m)   Wt 214 lb (97.1 kg)   BMI 36.73 kg/m  , BMI Body mass index is 36.73 kg/m. GEN: Well nourished, well developed, in no acute distress  HEENT: normal  Neck: no JVD, carotid bruits, or masses Cardiac: Irregularly irregular ; no murmurs, rubs, or gallops, trace edema Respiratory:  clear to auscultation bilaterally, normal work of breathing GI: soft, nontender, nondistended, + BS MS: no deformity or atrophy  Skin: warm and dry, no rash Neuro:  Strength and sensation are intact Psych: euthymic mood, full affect Dorsalis pedis is palpable bilaterally.  Small superficial ulceration on the left lateral leg with significant lymphedema and chronic stasis.  EKG:  EKG is  ordered today. EKG showed atrial fibrillation with low voltage.  Ventricular rate is 91 bpm.   Recent Labs: No results found for requested labs within last 8760 hours.    Lipid Panel    Component Value Date/Time   LDLCALC 75 03/12/2014      Wt Readings from Last 3 Encounters:  12/24/17 214 lb (97.1 kg)  04/23/17 204 lb 12.8 oz (92.9 kg)  10/30/16 206 lb (93.4 kg)       ASSESSMENT AND PLAN:  1.  Chronic atrial fibrillation:  Ventricular rate is reasonably controlled on carvedilol.  She is on long-term anticoagulation with warfarin.  She could not afford Eliquis due to cost.  I reviewed recent labs which showed normal hemoglobin.  Creatinine was stable at  1.13.  2. Essential hypertension:  Blood pressure is well controlled on current medications.  3. Coronary artery disease involving native coronary arteries without angina: She has no clear anginal symptoms. . Most recent stress test was in 2015 and was normal.  4. Hyperlipidemia: Continue treatment with atorvastatin.   5. Chronic diastolic heart failure: She appears to be euvolemic. Recent echocardiogram showed normal LV systolic function with minimal pulmonary hypertension.  6.  Chronic venous insufficiency with secondary lymphedema currently with new venous ulceration.  I think the best option for treatment is compression  pump and this was prescribed.  She has not been able to wear support stockings.  Disposition:   FU with me in 6 months  Signed,  Kathlyn Sacramento, MD  12/24/2017 5:37 PM    [place]

## 2017-12-24 NOTE — Patient Instructions (Addendum)
Medication Instructions: Your physician recommends that you continue on your current medications as directed. Please refer to the Current Medication list given to you today.  If you need a refill on your cardiac medications before your next appointment, please call your pharmacy.    Follow-Up: Your physician wants you to follow-up in 6 months with Dr. Fletcher Anon. An appointment has been made for 06/24/18 with 11:20.  We will call you about the Lymphedema pump.   Thank you for choosing [facility] at [facility]!!

## 2018-01-02 ENCOUNTER — Ambulatory Visit (INDEPENDENT_AMBULATORY_CARE_PROVIDER_SITE_OTHER): Payer: Medicare Other | Admitting: Sports Medicine

## 2018-01-02 ENCOUNTER — Encounter: Payer: Self-pay | Admitting: Sports Medicine

## 2018-01-02 DIAGNOSIS — B351 Tinea unguium: Secondary | ICD-10-CM | POA: Diagnosis not present

## 2018-01-02 DIAGNOSIS — M79676 Pain in unspecified toe(s): Secondary | ICD-10-CM

## 2018-01-02 DIAGNOSIS — E1140 Type 2 diabetes mellitus with diabetic neuropathy, unspecified: Secondary | ICD-10-CM

## 2018-01-02 NOTE — Progress Notes (Signed)
Subjective: Rebekah Stewart is a 83 y.o. female patient with history of diabetes who returns to office today complaining of long, painful nails  while ambulating in shoes; unable to trim. Patient states that the glucose reading this morning was not recorded, never checks. Patient states that she saw her PCP 3 weeks ago. Patient denies any other pedal complaints.  Last A1c 7.5  Patient Active Problem List   Diagnosis Date Noted  . Varicose veins of lower extremities with complications, bilateral 10/30/2016  . Pure hypercholesterolemia 05/21/2014  . Bilateral leg pain 11/26/2012  . Chronic diastolic heart failure ([place])   . Atrial fibrillation ([place])   . HTN (hypertension)   . Coronary artery disease    Current Outpatient Medications on File Prior to Visit  Medication Sig Dispense Refill  . atorvastatin (LIPITOR) 40 MG tablet Take 40 mg by mouth daily.    . benazepril (LOTENSIN) 20 MG tablet Take 20 mg by mouth daily.     . carvedilol (COREG) 25 MG tablet Take 25 mg by mouth 2 (two) times daily with a meal.    . Cholecalciferol (VITAMIN D) 2000 UNITS CAPS Take 1 capsule by mouth daily.    . fish oil-omega-3 fatty acids 1000 MG capsule Take 2 g by mouth 2 (two) times daily.     . furosemide (LASIX) 40 MG tablet Take 20 mg by mouth daily.     Marland Kitchen glucose blood (ACCU-CHEK AVIVA PLUS) test strip Check two times daily. Dx code E11.65 100 each 1  . isosorbide mononitrate (IMDUR) 60 MG 24 hr tablet Take 60 mg by mouth daily.      Marland Kitchen lidocaine (XYLOCAINE) 2 % solution     . metFORMIN (GLUCOPHAGE) 500 MG tablet Take 1 tablet (500 mg total) by mouth 3 (three) times daily. (Patient taking differently: Take 500 mg by mouth 2 (two) times daily with a meal. ) 270 tablet 1  . mupirocin ointment (BACTROBAN) 2 % Apply 1 application topically daily.    Marland Kitchen spironolactone (ALDACTONE) 25 MG tablet Take 25 mg by mouth daily.  3  . warfarin (COUMADIN) 4 MG tablet Take 4 mg by mouth as directed.    . warfarin (COUMADIN)  5 MG tablet TAKE 1 TABLET BY MOUTH DAILY AND 1.5 TABLETS ON SUNDAY AS DIRECTED  0   No current facility-administered medications on file prior to visit.    Allergies  Allergen Reactions  . Bee Venom Shortness Of Breath and Swelling  . Iodinated Diagnostic Agents Itching  . Penicillins Swelling  . Tape     Pulls the skin    No results found for this or any previous visit (from the past 2160 hour(s)).  Objective: General: Patient is awake, alert, and oriented x 3 and in no acute distress.  Integument: Skin is warm, dry and supple bilateral. Nails are tender, long, thickened and dystrophic with subungual debris, consistent with onychomycosis, 1-5 bilateral.  No acute signs of infection. No open lesions or preulcerative lesions present bilateral. Remaining integument unremarkable.  Vasculature:  Dorsalis Pedis pulse 1/4 bilateral. Posterior Tibial pulse  1/4 bilateral.  Capillary fill time <5 sec 1-5 bilateral. Diminished hair growth to the level of the digits. Temperature gradient within normal limits. Mild varicosities present bilateral. Trace edema present bilateral.   Neurology: The patient has diminished sensation measured with a 5.07/10g Semmes Weinstein Monofilament at all pedal sites bilateral . Vibratory sensation diminished bilateral with tuning fork. No Babinski sign present bilateral.   Musculoskeletal: No tenderness  bilateral. Muscular strength 5/5 in all lower extremity muscular groups bilateral without pain on range of motion. No tenderness with calf compression bilateral. AsymptomaticPes planus, bunion and hammertoes bilateral.  Assessment and Plan: Problem List Items Addressed This Visit    None    Visit Diagnoses    Dermatophytosis of nail    -  Primary   Type 2 diabetes, controlled, with neuropathy (HCC)       Pain of toe, unspecified laterality         -Examined patient. -Discussed and educated patient on diabetic foot care, especially with regards to the  vascular, neurological and musculoskeletal systems.  -Stressed the importance of good glycemic control and the detriment of not controlling glucose levels in relation to the foot. -Mechanically debrided all nails 1-5 bilateral using sterile nail nipper and filed with dremel without incident  -Continue with diabetic shoes -Answered all patient questions -Patient to return  in 3 months for at risk foot care and to complete safe step paperwork at this time -Patient advised to call the office if any problems or questions arise in the meantime.  Landis Martins, DPM

## 2018-01-09 ENCOUNTER — Telehealth: Payer: Self-pay | Admitting: Cardiovascular Disease

## 2018-01-09 DIAGNOSIS — B372 Candidiasis of skin and nail: Secondary | ICD-10-CM | POA: Diagnosis not present

## 2018-01-09 DIAGNOSIS — L218 Other seborrheic dermatitis: Secondary | ICD-10-CM | POA: Diagnosis not present

## 2018-01-09 DIAGNOSIS — L304 Erythema intertrigo: Secondary | ICD-10-CM | POA: Diagnosis not present

## 2018-01-09 NOTE — Telephone Encounter (Signed)
Attempted to call pt. Unable to leave message.

## 2018-01-09 NOTE — Telephone Encounter (Signed)
New Message    Patient is calling in reference to compression pump that was recommended. Please call to discuss.

## 2018-01-10 NOTE — Telephone Encounter (Signed)
Returned the call to the patient. She was calling in reference to a compression pump that has been ordered. She has been informed that a call will be placed to the company for an update.

## 2018-01-17 NOTE — Telephone Encounter (Signed)
Call placed to the patient to inform her to call Lelan Pons at [phone] (patient intake) Patient verbalized her understanding.

## 2018-01-20 DIAGNOSIS — Z7901 Long term (current) use of anticoagulants: Secondary | ICD-10-CM | POA: Diagnosis not present

## 2018-01-30 ENCOUNTER — Telehealth: Payer: Self-pay | Admitting: Cardiovascular Disease

## 2018-01-30 NOTE — Telephone Encounter (Signed)
Lm for Rebekah Stewart  to call back ./cy

## 2018-01-30 NOTE — Telephone Encounter (Signed)
Follow Up:   Please call,concerning the Compression Pump Order.

## 2018-02-03 DIAGNOSIS — R791 Abnormal coagulation profile: Secondary | ICD-10-CM | POA: Diagnosis not present

## 2018-02-03 NOTE — Telephone Encounter (Signed)
Call placed to [facility] concerning the compression pump for the patient. She stated that she needs a second note from the provider that states that the patient cannot wear compression stockings. She has been made aware that that this will be taken care of when the provider is back in the office tomorrow.

## 2018-02-17 DIAGNOSIS — R791 Abnormal coagulation profile: Secondary | ICD-10-CM | POA: Diagnosis not present

## 2018-03-06 DIAGNOSIS — R791 Abnormal coagulation profile: Secondary | ICD-10-CM | POA: Diagnosis not present

## 2018-03-07 DIAGNOSIS — S40862A Insect bite (nonvenomous) of left upper arm, initial encounter: Secondary | ICD-10-CM | POA: Diagnosis not present

## 2018-03-07 DIAGNOSIS — I5033 Acute on chronic diastolic (congestive) heart failure: Secondary | ICD-10-CM | POA: Diagnosis not present

## 2018-03-11 NOTE — Telephone Encounter (Signed)
Rebekah Stewart returning our call  Please call back

## 2018-03-12 ENCOUNTER — Telehealth: Payer: Self-pay | Admitting: *Deleted

## 2018-03-12 NOTE — Telephone Encounter (Signed)
Returned the call to Lelan Pons concerning the compression pump. She stated that they will need a second note providing information that the patient cannot wear compression stockings and level of severity.   The patient will need to come in for an appointment. Please see other epic encounter.

## 2018-03-12 NOTE — Telephone Encounter (Signed)
Call placed to the patient. There was no answer and the voicemail is full. Will try back.  The patient will need to come in for a follow up appointment in order to get approval for her lymphedema compression pump.

## 2018-03-13 NOTE — Telephone Encounter (Signed)
Call placed to the patient. Voicemail full.

## 2018-03-17 NOTE — Telephone Encounter (Signed)
Left a message to call back.

## 2018-03-17 NOTE — Telephone Encounter (Signed)
Appointment made for 7/16 with Dr. Fletcher Anon.

## 2018-03-18 ENCOUNTER — Encounter: Payer: Self-pay | Admitting: Dermatopathology

## 2018-03-18 DIAGNOSIS — Z79899 Other long term (current) drug therapy: Secondary | ICD-10-CM | POA: Diagnosis not present

## 2018-03-18 DIAGNOSIS — E559 Vitamin D deficiency, unspecified: Secondary | ICD-10-CM | POA: Diagnosis not present

## 2018-03-18 DIAGNOSIS — E785 Hyperlipidemia, unspecified: Secondary | ICD-10-CM | POA: Diagnosis not present

## 2018-03-18 DIAGNOSIS — E1140 Type 2 diabetes mellitus with diabetic neuropathy, unspecified: Secondary | ICD-10-CM | POA: Diagnosis not present

## 2018-03-20 DIAGNOSIS — E785 Hyperlipidemia, unspecified: Secondary | ICD-10-CM | POA: Diagnosis not present

## 2018-03-20 DIAGNOSIS — I4891 Unspecified atrial fibrillation: Secondary | ICD-10-CM | POA: Diagnosis not present

## 2018-03-20 DIAGNOSIS — R791 Abnormal coagulation profile: Secondary | ICD-10-CM | POA: Diagnosis not present

## 2018-03-20 DIAGNOSIS — E1140 Type 2 diabetes mellitus with diabetic neuropathy, unspecified: Secondary | ICD-10-CM | POA: Diagnosis not present

## 2018-03-20 DIAGNOSIS — I110 Hypertensive heart disease with heart failure: Secondary | ICD-10-CM | POA: Diagnosis not present

## 2018-03-20 DIAGNOSIS — E669 Obesity, unspecified: Secondary | ICD-10-CM | POA: Diagnosis not present

## 2018-03-27 DIAGNOSIS — R791 Abnormal coagulation profile: Secondary | ICD-10-CM | POA: Diagnosis not present

## 2018-04-02 DIAGNOSIS — R791 Abnormal coagulation profile: Secondary | ICD-10-CM | POA: Diagnosis not present

## 2018-04-09 DIAGNOSIS — R791 Abnormal coagulation profile: Secondary | ICD-10-CM | POA: Diagnosis not present

## 2018-04-11 ENCOUNTER — Encounter: Payer: Self-pay | Admitting: Sports Medicine

## 2018-04-11 ENCOUNTER — Ambulatory Visit (INDEPENDENT_AMBULATORY_CARE_PROVIDER_SITE_OTHER): Payer: Medicare Other | Admitting: Sports Medicine

## 2018-04-11 DIAGNOSIS — B351 Tinea unguium: Secondary | ICD-10-CM | POA: Diagnosis not present

## 2018-04-11 DIAGNOSIS — E1140 Type 2 diabetes mellitus with diabetic neuropathy, unspecified: Secondary | ICD-10-CM

## 2018-04-11 DIAGNOSIS — I739 Peripheral vascular disease, unspecified: Secondary | ICD-10-CM

## 2018-04-11 DIAGNOSIS — M79676 Pain in unspecified toe(s): Secondary | ICD-10-CM | POA: Diagnosis not present

## 2018-04-11 NOTE — Progress Notes (Signed)
Subjective: Rebekah Stewart is a 83 y.o. female patient with history of diabetes who returns to office today complaining of long, painful nails while ambulating in shoes; unable to trim. Patient states that the glucose reading this morning was not recorded, never checks. Patient states that she saw her PCP 4 weeks ago. Patient admits to worsening swelling in legs and a previous history of left lower leg wounds that have finally healed.  Patient states that her medical doctor has changed the way that she takes her Lasix to protect her kidneys and she is worried about the swelling that is happening in her legs.  Patient denies any other pedal complaints.  Last A1c 7.1  Patient Active Problem List   Diagnosis Date Noted  . Varicose veins of lower extremities with complications, bilateral 10/30/2016  . Pure hypercholesterolemia 05/21/2014  . Bilateral leg pain 11/26/2012  . Chronic diastolic heart failure ([place])   . Atrial fibrillation ([place])   . HTN (hypertension)   . Coronary artery disease    Current Outpatient Medications on File Prior to Visit  Medication Sig Dispense Refill  . atorvastatin (LIPITOR) 40 MG tablet Take 40 mg by mouth daily.    . benazepril (LOTENSIN) 20 MG tablet Take 20 mg by mouth daily.     . carvedilol (COREG) 25 MG tablet Take 25 mg by mouth 2 (two) times daily with a meal.    . Cholecalciferol (VITAMIN D) 2000 UNITS CAPS Take 1 capsule by mouth daily.    . fish oil-omega-3 fatty acids 1000 MG capsule Take 2 g by mouth 2 (two) times daily.     . furosemide (LASIX) 40 MG tablet Take 20 mg by mouth daily.     Marland Kitchen glucose blood (ACCU-CHEK AVIVA PLUS) test strip Check two times daily. Dx code E11.65 100 each 1  . isosorbide mononitrate (IMDUR) 60 MG 24 hr tablet Take 60 mg by mouth daily.      Marland Kitchen lidocaine (XYLOCAINE) 2 % solution     . metFORMIN (GLUCOPHAGE) 500 MG tablet Take 1 tablet (500 mg total) by mouth 3 (three) times daily. (Patient taking differently: Take 500 mg by  mouth 2 (two) times daily with a meal. ) 270 tablet 1  . mupirocin ointment (BACTROBAN) 2 % Apply 1 application topically daily.    Marland Kitchen spironolactone (ALDACTONE) 25 MG tablet Take 25 mg by mouth daily.  3  . warfarin (COUMADIN) 4 MG tablet Take 4 mg by mouth as directed.    . warfarin (COUMADIN) 5 MG tablet TAKE 1 TABLET BY MOUTH DAILY AND 1.5 TABLETS ON SUNDAY AS DIRECTED  0   No current facility-administered medications on file prior to visit.    Allergies  Allergen Reactions  . Bee Venom Shortness Of Breath and Swelling  . Iodinated Diagnostic Agents Itching  . Penicillins Swelling  . Tape     Pulls the skin    No results found for this or any previous visit (from the past 2160 hour(s)).  Objective: General: Patient is awake, alert, and oriented x 3 and in no acute distress.  Integument: Skin is warm, dry and supple bilateral. Nails are tender, long, thickened and dystrophic with subungual debris, consistent with onychomycosis, 1-5 bilateral.  No acute signs of infection. No open lesions or preulcerative lesions present bilateral. Remaining integument unremarkable.  Vasculature:  Dorsalis Pedis pulse 1/4 bilateral. Posterior Tibial pulse  0/4 bilateral.  Capillary fill time <5 sec 1-5 bilateral. Diminished hair growth to the level of  the digits. Temperature gradient within normal limits.  Moderate varicosities and venous stasis hyperpigmentation with 1+ pitting edema present bilateral.   Neurology: The patient has diminished sensation measured with a 5.07/10g Semmes Weinstein Monofilament at all pedal sites bilateral . Vibratory sensation diminished bilateral with tuning fork. No Babinski sign present bilateral.   Musculoskeletal: No tenderness bilateral. Muscular strength 5/5 in all lower extremity muscular groups bilateral without pain on range of motion. No tenderness with calf compression bilateral. Asymptomatic Pes planus, bunion and hammertoes bilateral.  Assessment and  Plan: Problem List Items Addressed This Visit    None    Visit Diagnoses    Dermatophytosis of nail    -  Primary   Type 2 diabetes, controlled, with neuropathy (HCC)       Pain of toe, unspecified laterality       PVD (peripheral vascular disease) ([place])         -Examined patient. -Discussed and educated patient on diabetic foot care, especially with regards to the vascular, neurological and musculoskeletal systems.  -Stressed the importance of good glycemic control and the detriment of not controlling glucose levels in relation to the foot. -Mechanically debrided all nails 1-5 bilateral using sterile nail nipper and filed with dremel without incident  -Safe step diabetic shoe order form was completed; office to contact primary care for approval / certification;  Office to arrange shoe fitting and dispensing. -Patient to return  in 3 months for at risk foot care and for diabetic shoe measurements as scheduled  -Advised patient to follow-up with cardiologist and medical doctor for edema management -Patient advised to call the office if any problems or questions arise in the meantime.  Landis Martins, DPM

## 2018-04-15 ENCOUNTER — Encounter: Payer: Self-pay | Admitting: Cardiovascular Disease

## 2018-04-15 ENCOUNTER — Ambulatory Visit (INDEPENDENT_AMBULATORY_CARE_PROVIDER_SITE_OTHER): Payer: Medicare Other | Admitting: Cardiovascular Disease

## 2018-04-15 VITALS — BP 96/60 | HR 100 | Ht 64.0 in | Wt 208.6 lb

## 2018-04-15 DIAGNOSIS — I482 Chronic atrial fibrillation, unspecified: Secondary | ICD-10-CM

## 2018-04-15 DIAGNOSIS — E785 Hyperlipidemia, unspecified: Secondary | ICD-10-CM | POA: Diagnosis not present

## 2018-04-15 DIAGNOSIS — I5032 Chronic diastolic (congestive) heart failure: Secondary | ICD-10-CM | POA: Diagnosis not present

## 2018-04-15 DIAGNOSIS — I2510 Atherosclerotic heart disease of native coronary artery without angina pectoris: Secondary | ICD-10-CM

## 2018-04-15 DIAGNOSIS — I10 Essential (primary) hypertension: Secondary | ICD-10-CM | POA: Diagnosis not present

## 2018-04-15 NOTE — Patient Instructions (Signed)
Medication Instructions: STOP the spironolactone  If you need a refill on your cardiac medications before your next appointment, please call your pharmacy.   Follow-Up: Your physician wants you to follow-up in 6 months with Dr. Fletcher Anon. You will receive a reminder letter in the mail two months in advance. If you don't receive a letter, please call our office at [phone] to schedule this follow-up appointment.   Thank you for choosing Heart[facility] at [facility]!!

## 2018-04-15 NOTE — Progress Notes (Signed)
Cardiology Office Note   Date:  04/15/2018   ID:  Rebekah Stewart, DOB 07/08/1935, MRN [id number]  PCP:  Nicoletta Dress, MD  Cardiologist:   Kathlyn Sacramento, MD   No chief complaint on file.     History of Present Illness: Rebekah Stewart is a 83 y.o. female who presents for  a followup visit regarding chronic atrial fibrillation, refractory hypertension and diastolic heart failure. She had a cardiac catheterization done in 2010 which showed mild to moderate three-vessel nonobstructive coronary artery disease. Worst stenosis was in the mid LAD which was estimated to be 50%. She has multiple chronic medical conditions including Diabetes and hyperlipidemia. Most recent nuclear stress test in June 2015 was normal.  She is known to have chronic bilateral leg edema  with stasis dermatitis.  She has secondary lymphedema due to chronic venous insufficiency. Lower extremity arterial Doppler in July showed normal ABI bilaterally. The patient could not afford Eliquis due to cost and she is back on warfarin.    She was noted to have venous ulceration on lower extremities during last visit which has healed since then.  She continues to have moderate bilateral leg edema in spite of to diuretics.  Blood pressure has been running low.  She reports occasional substernal chest tightness but this has been stable over the last few months.  Dyspnea is also stable.  Past Medical History:  Diagnosis Date  . Arthritis of back    lumbar  . Atrial fibrillation ([place]) 2010  . Atrial fibrillation ([place])   . Atrial fibrillation ([place])   . Chronic diastolic heart failure ([place])   . Colonic polyp   . Compression fracture of thoracic vertebra ([facility])   . Congestive heart failure, unspecified   . Coronary artery disease    mild non obstructive disease per cardiac cath in 2010  . Degeneration of lumbar or lumbosacral intervertebral disc   . Diverticulosis   . DM2 (diabetes mellitus, type 2) ([place])   . Hemorrhoids     . HLD (hyperlipidemia)   . HTN (hypertension)    diagnosed when she was in her 62s. Refractory. No RAS by [place] in 2010  . Long-term (current) use of anticoagulants   . Lumbosacral spondylosis without myelopathy   . Osteoarthrosis, unspecified whether generalized or localized, unspecified site   . Pain in thoracic spine   . Swelling of limb   . UTI (lower urinary tract infection)   . Varicose veins of lower extremities with other complications   . Vitamin D deficiency     Past Surgical History:  Procedure Laterality Date  . ADENOIDECTOMY    . CARDIAC CATHETERIZATION  2010   At [place]. Mild nonobstructive CAD  . CORONARY ANGIOPLASTY    . EYE SURGERY    . TONSILLECTOMY       Current Outpatient Medications  Medication Sig Dispense Refill  . atorvastatin (LIPITOR) 40 MG tablet Take 40 mg by mouth daily.    . benazepril (LOTENSIN) 20 MG tablet Take 20 mg by mouth daily.     . carvedilol (COREG) 25 MG tablet Take 25 mg by mouth 2 (two) times daily with a meal.    . Cholecalciferol (VITAMIN D) 2000 UNITS CAPS Take 1 capsule by mouth daily.    . fish oil-omega-3 fatty acids 1000 MG capsule Take 2 g by mouth 2 (two) times daily.     . furosemide (LASIX) 40 MG tablet Take 20 mg by mouth daily.     Marland Kitchen  glucose blood (ACCU-CHEK AVIVA PLUS) test strip Check two times daily. Dx code E11.65 100 each 1  . isosorbide mononitrate (IMDUR) 60 MG 24 hr tablet Take 60 mg by mouth daily.      Marland Kitchen lidocaine (XYLOCAINE) 2 % solution     . metFORMIN (GLUCOPHAGE) 500 MG tablet Take 1 tablet (500 mg total) by mouth 3 (three) times daily. (Patient taking differently: Take 500 mg by mouth 2 (two) times daily with a meal. ) 270 tablet 1  . mupirocin ointment (BACTROBAN) 2 % Apply 1 application topically daily.    Marland Kitchen spironolactone (ALDACTONE) 25 MG tablet Take 25 mg by mouth daily.  3  . warfarin (COUMADIN) 5 MG tablet TAKE 1 TABLET BY MOUTH DAILY AND 1.5 TABLETS ON SUNDAY AS DIRECTED  0  . warfarin  (COUMADIN) 6 MG tablet Take 6 mg by mouth 2 (two) times a week. Use as directed.     No current facility-administered medications for this visit.     Allergies:   Bee venom; Iodinated diagnostic agents; Penicillins; and Tape    Social History:  The patient  reports that she has never smoked. She has never used smokeless tobacco. She reports that she does not drink alcohol or use drugs.   Family History:  The patient's family history includes Cancer in her sister; Heart attack in her father; Heart disease in her brother, father, mother, and unknown relative; Stroke in her mother.    ROS:  Please see the history of present illness.   Otherwise, review of systems are positive for none.   All other systems are reviewed and negative.    PHYSICAL EXAM: VS:  BP 96/60   Pulse 100   Ht _0  (1.626 m)   Wt 208 lb 9.6 oz (94.6 kg)   BMI 35.81 kg/m  , BMI Body mass index is 35.81 kg/m. GEN: Well nourished, well developed, in no acute distress  HEENT: normal  Neck: no JVD, carotid bruits, or masses Cardiac: Irregularly irregular ; no murmurs, rubs, or gallops, trace edema Respiratory:  clear to auscultation bilaterally, normal work of breathing GI: soft, nontender, nondistended, + BS MS: no deformity or atrophy  Skin: warm and dry, no rash Neuro:  Strength and sensation are intact Psych: euthymic mood, full affect Dorsalis pedis is palpable bilaterally.  Significant lymphedema with +2 edema and no ulceration.  EKG:  EKG is  Not ordered today.    Recent Labs: No results found for requested labs within last 8760 hours.    Lipid Panel    Component Value Date/Time   LDLCALC 75 03/12/2014      Wt Readings from Last 3 Encounters:  04/15/18 208 lb 9.6 oz (94.6 kg)  12/24/17 214 lb (97.1 kg)  04/23/17 204 lb 12.8 oz (92.9 kg)       ASSESSMENT AND PLAN:  1.  Chronic atrial fibrillation:  Ventricular rate is reasonably controlled on carvedilol.  She is on long-term  anticoagulation with warfarin.  She could not afford Eliquis due to cost.    2. Essential hypertension:  Blood pressure has been running low.  I elected to discontinue spironolactone for now.  3. Coronary artery disease involving native coronary arteries with atypical chest pain: Most recent stress test in 2015 was normal.  If symptoms worsen, I recommend repeating nuclear stress test.  4. Hyperlipidemia: Continue treatment with atorvastatin.   5. Chronic diastolic heart failure: She appears to be euvolemic on current dose of furosemide.. Recent echocardiogram showed  normal LV systolic function with minimal pulmonary hypertension.  I discontinued spironolactone due to low blood pressure.  6.  Chronic venous insufficiency with secondary lymphedema currently with history venous ulceration.  She continues to have class II edema in spite of using 2 diuretics.  The patient has failed leg elevation, regular walking.  Bandaging system helped with the ulceration but her edema persist which puts her at significant risk for recurrent wounds.  Thus, I recommend lymphedema pump.   Disposition:   FU with me in 6 months  Signed,  Kathlyn Sacramento, MD  04/15/2018 11:50 AM    [place]

## 2018-04-16 DIAGNOSIS — R791 Abnormal coagulation profile: Secondary | ICD-10-CM | POA: Diagnosis not present

## 2018-04-30 ENCOUNTER — Telehealth: Payer: Self-pay | Admitting: Cardiovascular Disease

## 2018-04-30 NOTE — Telephone Encounter (Signed)
Returned call to patient of Dr. Fletcher Anon. She received a compression pump for her leg today and used it for 20 minutes and her right leg did fine and left leg hurt. She states her left leg still hurt despite lowering the pressure. Patient states there is a lump under her skin where her leg was hurting but is unsure if it is the lump or her varicose veins that are causing her the pain. She would like Dr. Tyrell Antonio advice on what to do next.

## 2018-04-30 NOTE — Telephone Encounter (Signed)
New Message         Patient recv'd her leg sleeve today, and patient had discomfort while using the leg sleeve. Patient was told by the rep. From [employer] not to use the sleeve until she talk with the doctor because she has a knot on her leg. Pls advise

## 2018-05-01 NOTE — Telephone Encounter (Signed)
Not exactly sure if it is a sizing or another technical issue. Please get in touch with the company's rep to see what they recommend.

## 2018-05-01 NOTE — Telephone Encounter (Signed)
Spoke with pt, the patient has not used the pump since she had the pain the first time. The patient feels it was a varicose vein that was felt and she now reports she has bruising in the area of the lump and the lump is still there. Spoke with amy, compression rep @ [phone], she reports the size of the sleeve was the right size and the pain she had had nothing to do with the pressure because they went all the way to the lowest pressure available. The pain was localized to a spot on her leg that amy reports felt hard like a lipoma. There was no redness or bruising when the sleeve was removed. She did not see any varicose veins in the area. Will make dr Fletcher Anon aware.

## 2018-05-02 DIAGNOSIS — R791 Abnormal coagulation profile: Secondary | ICD-10-CM | POA: Diagnosis not present

## 2018-05-02 DIAGNOSIS — L905 Scar conditions and fibrosis of skin: Secondary | ICD-10-CM | POA: Diagnosis not present

## 2018-05-02 DIAGNOSIS — L918 Other hypertrophic disorders of the skin: Secondary | ICD-10-CM | POA: Diagnosis not present

## 2018-05-02 DIAGNOSIS — L814 Other melanin hyperpigmentation: Secondary | ICD-10-CM | POA: Diagnosis not present

## 2018-05-02 DIAGNOSIS — L821 Other seborrheic keratosis: Secondary | ICD-10-CM | POA: Diagnosis not present

## 2018-05-02 DIAGNOSIS — I8392 Asymptomatic varicose veins of left lower extremity: Secondary | ICD-10-CM | POA: Diagnosis not present

## 2018-05-02 DIAGNOSIS — D692 Other nonthrombocytopenic purpura: Secondary | ICD-10-CM | POA: Diagnosis not present

## 2018-05-02 DIAGNOSIS — D1801 Hemangioma of skin and subcutaneous tissue: Secondary | ICD-10-CM | POA: Diagnosis not present

## 2018-05-05 NOTE — Telephone Encounter (Signed)
She should try using the pump again once the pain has resolved completely.  Let us see if she can tolerate it.

## 2018-05-06 NOTE — Telephone Encounter (Signed)
Patient called and made aware of Dr. Tyrell Antonio recommendations. She will retry the pump and call back with the results.

## 2018-05-08 ENCOUNTER — Ambulatory Visit: Payer: Medicare Other | Admitting: *Deleted

## 2018-05-08 DIAGNOSIS — E1140 Type 2 diabetes mellitus with diabetic neuropathy, unspecified: Secondary | ICD-10-CM

## 2018-05-16 ENCOUNTER — Telehealth: Payer: Self-pay | Admitting: Cardiovascular Disease

## 2018-05-16 DIAGNOSIS — R791 Abnormal coagulation profile: Secondary | ICD-10-CM | POA: Diagnosis not present

## 2018-05-16 NOTE — Telephone Encounter (Signed)
New message   Per Alden Benjamin, please refax the Certificate Medical Neccisity  /There needs to be 2 corrections: under section above question 1 need number 99 on number of months and the code I89.0 added to the other codes.

## 2018-05-16 NOTE — Telephone Encounter (Signed)
Documentation has been refaxed.

## 2018-05-23 DIAGNOSIS — M109 Gout, unspecified: Secondary | ICD-10-CM | POA: Diagnosis not present

## 2018-05-23 DIAGNOSIS — Z6837 Body mass index (BMI) 37.0-37.9, adult: Secondary | ICD-10-CM | POA: Diagnosis not present

## 2018-05-26 ENCOUNTER — Telehealth: Payer: Self-pay | Admitting: Cardiovascular Disease

## 2018-05-26 NOTE — Telephone Encounter (Signed)
Returned call to patient.She stated she found out today if Dr.Arida would send in a prescription for Eliquis she might qualify for help with the cost.Stated she is interested in trying to see if she qualifies.Stated she wanted a prescription sent to [facility] [address] in [place].Advised I will send message to his RN.

## 2018-05-26 NOTE — Telephone Encounter (Signed)
New Message:   Pt has a question about Eliquis

## 2018-05-27 NOTE — Telephone Encounter (Signed)
Returned the call to the patient. She stated that she has been offered a chance at a discount program for Eliquis. She would like to know if she should attempt to get accepted and switch to Eliquis from Warfarin or to just stay on the Warfarin. She stated that she is fine with either one.

## 2018-05-28 NOTE — Telephone Encounter (Signed)
Eliquis is better if she can afford it. He dose should be 5 mg bid.

## 2018-05-29 MED ORDER — APIXABAN 5 MG PO TABS: 5.0000 mg | ORAL_TABLET | Freq: Two times a day (BID) | ORAL | 1 refills | Status: DC

## 2018-05-29 NOTE — Telephone Encounter (Signed)
Eliquis 5 mg bid has been sent in for the patient. She has been advised to call if she gets the assistance. She will need to be transitioned to the Eliquis. She will call when she finds out.

## 2018-05-29 NOTE — Telephone Encounter (Signed)
Follow Up   Rebekah Stewart from [facility] is calling, states they received a prescription for Eliquis but the pt is taking Warfarin

## 2018-05-29 NOTE — Telephone Encounter (Signed)
[facility] has been made aware to not fill the Eliquis unless informed otherwise. The patient is waiting to hear if she will get approved for Eliquis.

## 2018-05-30 ENCOUNTER — Encounter: Payer: Self-pay | Admitting: Sports Medicine

## 2018-05-30 ENCOUNTER — Ambulatory Visit (INDEPENDENT_AMBULATORY_CARE_PROVIDER_SITE_OTHER): Payer: Medicare Other

## 2018-05-30 ENCOUNTER — Ambulatory Visit (INDEPENDENT_AMBULATORY_CARE_PROVIDER_SITE_OTHER): Payer: Medicare Other | Admitting: Sports Medicine

## 2018-05-30 DIAGNOSIS — R791 Abnormal coagulation profile: Secondary | ICD-10-CM | POA: Diagnosis not present

## 2018-05-30 DIAGNOSIS — M25475 Effusion, left foot: Secondary | ICD-10-CM

## 2018-05-30 DIAGNOSIS — M779 Enthesopathy, unspecified: Secondary | ICD-10-CM

## 2018-05-30 DIAGNOSIS — M10472 Other secondary gout, left ankle and foot: Secondary | ICD-10-CM

## 2018-05-30 DIAGNOSIS — M79675 Pain in left toe(s): Secondary | ICD-10-CM | POA: Diagnosis not present

## 2018-05-30 DIAGNOSIS — M25471 Effusion, right ankle: Secondary | ICD-10-CM

## 2018-05-30 DIAGNOSIS — I2510 Atherosclerotic heart disease of native coronary artery without angina pectoris: Secondary | ICD-10-CM

## 2018-05-30 DIAGNOSIS — M25476 Effusion, unspecified foot: Secondary | ICD-10-CM

## 2018-05-30 DIAGNOSIS — M25473 Effusion, unspecified ankle: Secondary | ICD-10-CM

## 2018-05-30 MED ORDER — COLCHICINE 0.6 MG PO TABS: 0.6000 mg | ORAL_TABLET | Freq: Every day | ORAL | 0 refills | Status: DC

## 2018-05-30 NOTE — Patient Instructions (Signed)
SOFT CAST/ UNNA BOOT INSTRUCTIONS Unna boot needs to be worn until next office visit.  We will remove them when you come to office next week. BATHING INSTRUCTIONS Keep soft cast clean and dry. If wet, pat dry and blow dry with hair dryer and call office for further instructions.   CHERRY JUICE Cherry concentrate juice: Mix 2 ounces of concentrated juice to 2 ounces of water and drink once daily.  You may increase to drinking 4 ounces of concentrated cherry juice mixed with 4 ounces of water daily if you can tolerate how tart the drink is.   COLCHICINE PILLS Take colchicine as prescribed for the next 3 to 4 days if pain is better after 3 to 4 days may stop the colchicine  Information for patients with Gout  Gout defined-Gout occurs when urate crystals accumulate in your joint causing the inflammation and intense pain of gout attack.  Urate crystals can form when you have high levels of uric acid in your blood.  Your body produces uric acid when it breaks down prurines-substances that are found naturally in your body, as well as in certain foods such as organ meats, anchioves, herring, asparagus, and mushrooms.  Normally uric acid dissolves in your blood and passes through your kidneys into your urine.  But sometimes your body either produces too much uric acid or your kidneys excrete too little uric acid.  When this happens, uric acid can build up, forming sharp needle-like urate crystals in a joint or surrounding tissue that cause pain, inflammation and swelling.    Gout is characterized by sudden, severe attacks of pain, redness and tenderness in joints, often the joint at the base of the big toe.  Gout is complex form of arthritis that can affect anyone.  Men are more likely to get gout but women become increasingly more susceptible to gout after menopause.  An acute attack of gout can wake you up in the middle of the night with the sensation that your big toe is on fire.  The affected joint  is hot, swollen and so tender that even the weight or the sheet on it may seem intolerable.  If you experience symptoms of an acute gout attack it is important to your doctor as soon as the symptoms start.  Gout that goes untreated can lead to worsening pain and joint damage.  Risk Factors:  You are more likely to develop gout if you have high levels of uric acid in your body.    Factors that increase the uric acid level in your body include:  Lifestyle factors.  Excessive alcohol use-generally more than two drinks a day for men and more than one for women increase the risk of gout.  Medical conditions.  Certain conditions make it more likely that you will develop gout.  These include hypertension, and chronic conditions such as diabetes, high levels of fat and cholesterol in the blood, and narrowing of the arteries.  Certain medications.  The uses of Thiazide diuretics- commonly used to treat hypertension and low dose aspirin can also increase uric acid levels.  Family history of gout.  If other members of your family have had gout, you are more likely to develop the disease.  Age and sex. Gout occurs more often in men than it does in women, primarily because women tend to have lower uric acid levels than men do.  Men are more likely to develop gout earlier usually between the ages of 76-50- whereas women generally develop  signs and symptoms after menopause.    Tests and diagnosis:  Tests to help diagnose gout may include:  Blood test.  Your doctor may recommend a blood test to measure the uric acid level in your blood .  Blood tests can be misleading, though.  Some people have high uric acid levels but never experience gout.  And some people have signs and symptoms of gout, but don't have unusual levels of uric acid in their blood.  Joint fluid test.  Your doctor may use a needle to draw fluid from your affected joint.  When examined under the microscope, your joint fluid may reveal urate  crystals.  Treatment:  Treatment for gout usually involves medications.  What medications you and your doctor choose will be based on your current health and other medications you currently take.  Gout medications can be used to treat acute gout attacks and prevent future attacks as well as reduce your risk of complications from gout such as the development of tophi from urate crystal deposits.  Alternative medicine:   Certain foods have been studied for their potential to lower uric acid levels, including:  Coffee.  Studies have found an association between coffee drinking (regular and decaf) and lower uric acid levels.  The evidence is not enough to encourage non-coffee drinkers to start, but it may give clues to new ways of treating gout in the future.  Vitamin C.  Supplements containing vitamin C may reduce the levels of uric acid in your blood.  However, vitamin as a treatment for gout. Don't assume that if a little vitamin C is good, than lots is better.  Megadoses of vitamin C may increase your bodies uric acid levels.  Cherries.  Cherries have been associated with lower levels of uric acid in studies, but it isn't clear if they have any effect on gout signs and symptoms.  Eating more cherries and other dar-colored fruits, such as blackberries, blueberries, purple grapes and raspberries, may be a safe way to support your gout treatment.    Lifestyle/Diet Recommendations:   Drink 8 to 16 cups ( about 2 to 4 liters) of fluid each day, with at least half being water.  Avoid alcohol  Eat a moderate amount of protein, preferably from healthy sources, such as low-fat or fat-free dairy, tofu, eggs, and nut butters.  Limit you daily intake of meat, fish, and poultry to 4 to 6 ounces.  Avoid high fat meats and desserts.  Decrease you intake of shellfish, beef, lamb, pork, eggs and cheese.  Choose a good source of vitamin C daily such as citrus fruits, strawberries, broccoli,  brussel  sprouts, papaya, and cantaloupe.   Choose a good source of vitamin A every other day such as yellow fruits, or dark green/yellow vegetables.  Avoid drastic weigh reduction or fasting.  If weigh loss is desired lose it over a period of several months.  See "dietary considerations.." chart for specific food recommendations.  Dietary Considerations for people with Gout  Food with negligible purine content (0-15 mg of purine nitrogen per 100 grams food)  May use as desired except on calorie variations  Non fat milk Cocoa Cereals (except in list II) Hard candies  Buttermilk Carbonated drinks Vegetables (except in list II) Sherbet  Coffee Fruits Sugar Honey  Tea Cottage Cheese Gelatin-jell-o Salt  Fruit juice Breads Angel food Cake   Herbs/spices Jams/Jellies [employer]    Foods that do not contain excessive purine content, but must be limited  due to fat content  Cream Eggs Oil and Salad Dressing  Half and Half Peanut Butter Chocolate  Whole Milk Cakes Potato Chips  Butter Ice Cream Fried Foods  Cheese Nuts Waffles, pancakes   List II: Food with moderate purine content (50-150 mg of purine nitrogen per 100 grams of food)  Limit total amount each day to 5 oz. cooked Lean meat, other than those on list III   Poultry, other than those on list III Fish, other than those on list III   Seafood, other than those on list III  These foods may be used occasionally  Peas Lentils Bran  Spinach Oatmeal Dried Beans and Peas  Asparagus Wheat Germ Mushrooms   Additional information about meat choices  Choose fish and poultry, particularly without skin, often.  Select lean, well trimmed cuts of meat.  Avoid all fatty meats, bacon , sausage, fried meats, fried fish, or poultry, luncheon meats, cold cuts, hot dogs, meats canned or frozen in gravy, spareribs and frozen and packaged prepared meats.   List III: Foods with HIGH purine content / Foods to AVOID (150-800 mg of purine  nitrogen per 100 grams of food)  Anchovies Herring Meat Broths  Liver Mackerel Meat Extracts  Kidney Scallops Meat Drippings  Sardines Wild Game Mincemeat  Sweetbreads Goose Gravy  Heart Tongue Yeast, baker's and brewers   Commercial soups made with any of the foods listed in List II or List III  In addition avoid all alcoholic beverages

## 2018-05-30 NOTE — Progress Notes (Signed)
Subjective: JALEIGHA DEANE is a 83 y.o. female patient who presents to office for evaluation of Left foot pain. Patient complains of progressive pain especially over the last year in the Left foot at the big toe joint for the last 10 days.  Patient reports that she went to see her primary care doctor who thought she was having a gout flare and did blood test but the blood test did not show anything patient also states that her primary care doctor did give her pain medicine of which she did not take any. Ranks pain 3-4/10. Admits to warmth, redness, and swelling to the area that is unrelieved throbbing in nature that started all of a sudden. Patient has tried seeing her PCP with no relief in symptoms.  Denies previous gouty attack/family history/change in diet however does have a possible history of kidney issues. Patient denies any other pedal complaints.   Patient Active Problem List   Diagnosis Date Noted  . Varicose veins of lower extremities with complications, bilateral 10/30/2016  . Pure hypercholesterolemia 05/21/2014  . Bilateral leg pain 11/26/2012  . Chronic diastolic heart failure ([place])   . Atrial fibrillation ([place])   . HTN (hypertension)   . Coronary artery disease     Current Outpatient Medications on File Prior to Visit  Medication Sig Dispense Refill  . apixaban (ELIQUIS) 5 MG TABS tablet Take 1 tablet (5 mg total) by mouth 2 (two) times daily. 60 tablet 1  . atorvastatin (LIPITOR) 40 MG tablet Take 40 mg by mouth daily.    . benazepril (LOTENSIN) 20 MG tablet Take 20 mg by mouth daily.     . carvedilol (COREG) 25 MG tablet Take 25 mg by mouth 2 (two) times daily with a meal.    . Cholecalciferol (VITAMIN D) 2000 UNITS CAPS Take 1 capsule by mouth daily.    . fish oil-omega-3 fatty acids 1000 MG capsule Take 2 g by mouth 2 (two) times daily.     . furosemide (LASIX) 40 MG tablet Take 20 mg by mouth daily.     Marland Kitchen glucose blood (ACCU-CHEK AVIVA PLUS) test strip Check two times daily.  Dx code E11.65 100 each 1  . isosorbide mononitrate (IMDUR) 60 MG 24 hr tablet Take 60 mg by mouth daily.      Marland Kitchen lidocaine (XYLOCAINE) 2 % solution     . metFORMIN (GLUCOPHAGE) 500 MG tablet Take 1 tablet (500 mg total) by mouth 3 (three) times daily. (Patient taking differently: Take 500 mg by mouth 2 (two) times daily with a meal. ) 270 tablet 1  . mupirocin ointment (BACTROBAN) 2 % Apply 1 application topically daily.    Marland Kitchen warfarin (COUMADIN) 5 MG tablet TAKE 1 TABLET BY MOUTH DAILY AND 1.5 TABLETS ON SUNDAY AS DIRECTED  0  . warfarin (COUMADIN) 6 MG tablet Take 6 mg by mouth 2 (two) times a week. Use as directed.     No current facility-administered medications on file prior to visit.     Allergies  Allergen Reactions  . Bee Venom Shortness Of Breath and Swelling  . Iodinated Diagnostic Agents Itching  . Penicillins Swelling  . Tape     Pulls the skin    Objective:  General: Alert and oriented x3 in no acute distress  Dermatology: Focal Swelling, warmth, redness present on the left first metatarsophalangeal joint, No open lesions bilateral lower extremities, no webspace macerations, no ecchymosis bilateral, all nails x 10 are well manicured.  Vascular: Dorsalis Pedis  1/4 and Posterior Tibial pedal pulses 0/4, Capillary Fill Time 3 seconds, scant pedal hair growth bilateral,Temperature gradient increased over the left first metatarsophalangeal joint.  Edema present bilateral however left forefoot edema slightly worse compared to right.  Neurology: Johney Maine sensation intact via light touch bilateral, protective sensation diminished bilateral.   Musculoskeletal: There is tenderness with palpation at left first metatarsophalangeal joint. No pain with calf compression bilateral. All joint range of motion is within normal limits except at the left first MPJ where there is pain and limiation, Strength within normal limits in all groups bilateral.   Gait: Unassisted, Antalgic gait avoiding  weight on left/right foot  Xrays  Left Foot    Impression: No acute osseous findings significant soft tissue swelling       Assessment and Plan: Problem List Items Addressed This Visit    None    Visit Diagnoses    Great toe pain, left    -  Primary   Relevant Medications   colchicine 0.6 MG tablet   Other Relevant Orders   DG Foot Complete Left   Acute gout due to other secondary cause involving toe of left foot       Relevant Medications   colchicine 0.6 MG tablet   Capsulitis       Bilateral swelling of feet and ankles          -Complete examination performed -Xrays reviewed -Discussed treatement options for gouty arthritis versus capsulitis, versus tendinitis and gout education provided -Teacher, English as a foreign language boot bilateral for bilateral foot and ankle swelling -Rx Colchicine 0.54m -Reviewed uric acid which was noted to be 6.6 -Advised patient to drink cherry juice concentrate to also help for gout -Patient to return in 1 week for removal of Unna boots or sooner if condition worsens.  TLandis Martins DPM

## 2018-06-03 ENCOUNTER — Other Ambulatory Visit: Payer: Self-pay | Admitting: Sports Medicine

## 2018-06-03 DIAGNOSIS — M79675 Pain in left toe(s): Secondary | ICD-10-CM

## 2018-06-03 DIAGNOSIS — M25473 Effusion, unspecified ankle: Secondary | ICD-10-CM

## 2018-06-03 DIAGNOSIS — M25471 Effusion, right ankle: Secondary | ICD-10-CM

## 2018-06-03 DIAGNOSIS — M10472 Other secondary gout, left ankle and foot: Secondary | ICD-10-CM

## 2018-06-03 DIAGNOSIS — M25475 Effusion, left foot: Secondary | ICD-10-CM

## 2018-06-03 DIAGNOSIS — M25476 Effusion, unspecified foot: Secondary | ICD-10-CM

## 2018-06-03 DIAGNOSIS — M779 Enthesopathy, unspecified: Secondary | ICD-10-CM

## 2018-06-06 ENCOUNTER — Ambulatory Visit: Payer: Medicare Other | Admitting: Sports Medicine

## 2018-06-06 ENCOUNTER — Telehealth: Payer: Self-pay | Admitting: Sports Medicine

## 2018-06-06 DIAGNOSIS — I4891 Unspecified atrial fibrillation: Secondary | ICD-10-CM | POA: Diagnosis not present

## 2018-06-06 DIAGNOSIS — R0600 Dyspnea, unspecified: Secondary | ICD-10-CM | POA: Diagnosis not present

## 2018-06-06 DIAGNOSIS — R079 Chest pain, unspecified: Secondary | ICD-10-CM | POA: Diagnosis not present

## 2018-06-06 DIAGNOSIS — I5032 Chronic diastolic (congestive) heart failure: Secondary | ICD-10-CM | POA: Diagnosis not present

## 2018-06-06 DIAGNOSIS — R791 Abnormal coagulation profile: Secondary | ICD-10-CM | POA: Diagnosis not present

## 2018-06-06 DIAGNOSIS — R0602 Shortness of breath: Secondary | ICD-10-CM | POA: Diagnosis not present

## 2018-06-06 NOTE — Telephone Encounter (Signed)
She should remove the wraps and after she is seen in the ED make a follow up to be seen in office on next week for further care. -Dr. Chauncey Cruel

## 2018-06-06 NOTE — Telephone Encounter (Signed)
This is Rebekah Stewart. I saw my primary care doctor today and while here I was having chest pains and I was short of breath so they kept me and did an EKG. Because of that, I was unable to make my follow up appointment with Dr. Cannon Kettle today. I saw her last week and she wrapped my feet/legs because she thought I might have the gout. Could you please call me and let me know if I need to go ahead and take the wraps off because they were going to take them off today when I came in to check my feet, or what do I need to do? My number is [phone]. Talk to you later. Bye.

## 2018-06-06 NOTE — Telephone Encounter (Signed)
Called and spoke with the patient she is on her way to the ER for chest x-rays now she states he has not felt well for several days. I told patient that yes she may remove the wraps and that we want her to call us if she feels like her symptoms worsen or fail to improve.  Patient apologized for canceling at the last minute I told her there was no reason to apologize to take care of herself and let us know how she is doing next week patient stated understanding.

## 2018-06-09 ENCOUNTER — Telehealth: Payer: Self-pay | Admitting: Sports Medicine

## 2018-06-09 NOTE — Telephone Encounter (Signed)
I am taking the cherry pill and the cherry juice for gout, and it is better, its gone. I'm just wondering how long am I supposed to keep taking the pill and drinking the juice? Also, since the heart doctor had me to use the edema pump on my legs, I haven't used it in a long time because of the gout and stuff. I need to know if I would be alright if I start using the edema pump since my feet and legs are so swollen. My phone number is [phone]. Thank you. Bye bye.

## 2018-06-10 NOTE — Telephone Encounter (Signed)
She may resume the edema pumps and continue taking Cherry juice and pill to prevent another gout attack from happening. When she sees her PCP she should further discuss long term gout management with her PCP -Dr Chauncey Cruel

## 2018-06-10 NOTE — Telephone Encounter (Signed)
Left message informing pt of Dr. Leeanne Rio orders of 06/10/2018 7:24am.

## 2018-06-13 ENCOUNTER — Ambulatory Visit (INDEPENDENT_AMBULATORY_CARE_PROVIDER_SITE_OTHER): Payer: Medicare Other | Admitting: Sports Medicine

## 2018-06-13 ENCOUNTER — Encounter: Payer: Self-pay | Admitting: Sports Medicine

## 2018-06-13 DIAGNOSIS — M25473 Effusion, unspecified ankle: Secondary | ICD-10-CM | POA: Diagnosis not present

## 2018-06-13 DIAGNOSIS — M25476 Effusion, unspecified foot: Secondary | ICD-10-CM | POA: Diagnosis not present

## 2018-06-13 DIAGNOSIS — M79675 Pain in left toe(s): Secondary | ICD-10-CM | POA: Diagnosis not present

## 2018-06-13 DIAGNOSIS — M10472 Other secondary gout, left ankle and foot: Secondary | ICD-10-CM | POA: Diagnosis not present

## 2018-06-13 DIAGNOSIS — E1140 Type 2 diabetes mellitus with diabetic neuropathy, unspecified: Secondary | ICD-10-CM | POA: Diagnosis not present

## 2018-06-13 DIAGNOSIS — M779 Enthesopathy, unspecified: Secondary | ICD-10-CM

## 2018-06-13 DIAGNOSIS — M25475 Effusion, left foot: Secondary | ICD-10-CM

## 2018-06-13 DIAGNOSIS — I2510 Atherosclerotic heart disease of native coronary artery without angina pectoris: Secondary | ICD-10-CM | POA: Diagnosis not present

## 2018-06-13 DIAGNOSIS — R791 Abnormal coagulation profile: Secondary | ICD-10-CM | POA: Diagnosis not present

## 2018-06-13 NOTE — Progress Notes (Signed)
Subjective: ROLENE ANDRADES is a 83 y.o. female patient who presents to office for follow-up evaluation of Left foot pain. Patient reports that her pain is 90% better states that she was compliant with taking her colchicine of which she took for 3 days and has also picked up cherry juice and cherry tablets of which she feels like has really helped states that there is no warmth no redness and the swelling is much improved however she still has some because she has not been using her lymphedema pumps.  Patient states that occasionally she does have a feeling of pain but her foot feels much better especially over the big toe.  Patient is also here for pickup of diabetic shoes. Patient denies any other pedal complaints.   Patient Active Problem List   Diagnosis Date Noted  . Varicose veins of lower extremities with complications, bilateral 10/30/2016  . Pure hypercholesterolemia 05/21/2014  . Bilateral leg pain 11/26/2012  . Chronic diastolic heart failure ([place])   . Atrial fibrillation ([place])   . HTN (hypertension)   . Coronary artery disease     Current Outpatient Medications on File Prior to Visit  Medication Sig Dispense Refill  . apixaban (ELIQUIS) 5 MG TABS tablet Take 1 tablet (5 mg total) by mouth 2 (two) times daily. 60 tablet 1  . atorvastatin (LIPITOR) 40 MG tablet Take 40 mg by mouth daily.    . benazepril (LOTENSIN) 20 MG tablet Take 20 mg by mouth daily.     . carvedilol (COREG) 25 MG tablet Take 25 mg by mouth 2 (two) times daily with a meal.    . Cholecalciferol (VITAMIN D) 2000 UNITS CAPS Take 1 capsule by mouth daily.    . colchicine 0.6 MG tablet Take 1 tablet (0.6 mg total) by mouth daily. May stop taking if better after 3-4 days 7 tablet 0  . fish oil-omega-3 fatty acids 1000 MG capsule Take 2 g by mouth 2 (two) times daily.     . furosemide (LASIX) 40 MG tablet Take 20 mg by mouth daily.     Marland Kitchen glucose blood (ACCU-CHEK AVIVA PLUS) test strip Check two times daily. Dx code E11.65  100 each 1  . isosorbide mononitrate (IMDUR) 60 MG 24 hr tablet Take 60 mg by mouth daily.      Marland Kitchen lidocaine (XYLOCAINE) 2 % solution     . metFORMIN (GLUCOPHAGE) 500 MG tablet Take 1 tablet (500 mg total) by mouth 3 (three) times daily. (Patient taking differently: Take 500 mg by mouth 2 (two) times daily with a meal. ) 270 tablet 1  . mupirocin ointment (BACTROBAN) 2 % Apply 1 application topically daily.    Marland Kitchen warfarin (COUMADIN) 5 MG tablet TAKE 1 TABLET BY MOUTH DAILY AND 1.5 TABLETS ON SUNDAY AS DIRECTED  0  . warfarin (COUMADIN) 6 MG tablet Take 6 mg by mouth 2 (two) times a week. Use as directed.     No current facility-administered medications on file prior to visit.     Allergies  Allergen Reactions  . Bee Venom Shortness Of Breath and Swelling  . Iodinated Diagnostic Agents Itching  . Penicillins Swelling  . Tape     Pulls the skin    Objective:  General: Alert and oriented x3 in no acute distress  Dermatology: Diffuse swelling consistent with history of lymphedema without warmth or redness at the left first metatarsophalangeal joint, No open lesions bilateral lower extremities, no webspace macerations, no ecchymosis bilateral, all nails x  10 are well manicured.  Vascular: Dorsalis Pedis 1/4 and Posterior Tibial pedal pulses 0/4, Capillary Fill Time 3 seconds, scant pedal hair growth bilateral,Temperature gradient increased over the left first metatarsophalangeal joint.  Edema present bilateral lower extremities 2+ pitting in nature with very minimal clear fluid oozing on the left leg.  Neurology: Johney Maine sensation intact via light touch bilateral, protective sensation diminished bilateral.   Musculoskeletal: There is no tenderness with palpation at left first metatarsophalangeal joint. No pain with calf compression bilateral. All joint range of motion is within normal limits with no reproducible pain at the left first metatarsophalangeal joint.   Assessment and Plan: Problem  List Items Addressed This Visit    None    Visit Diagnoses    Acute gout due to other secondary cause involving toe of left foot    -  Primary   Great toe pain, left       Capsulitis       Bilateral swelling of feet and ankles       Type 2 diabetes, controlled, with neuropathy (HCC)          -Complete examination performed -Rediscussed long-term care for likely gout attack which patient was previously experiencing and advised patient to continue with cherry juice and tablets for at least 1 months then after 1 month may continue with tablets or juice as needed -Advised patient that over the next week may use her lymphedema pump once a day on the lowest setting for about 20 minutes until her legs can tolerated and then may resume her normal schedule of using them twice a day to assist with edema control I advised patient if her swelling and edema worsens to return to office or to further discuss with her primary care doctor and other treatment options may benefit from Unna boots if recurs -Diabetic shoes were attempted to be dispensed to patient today however patient reports that this is the wrong color so our office will send back the shoes in order them and white instead of black per patient request -Patient to return when called for pickup of diabetic shoes and as scheduled for routine diabetic foot care or sooner if condition worsens.  Landis Martins, DPM

## 2018-06-20 DIAGNOSIS — R791 Abnormal coagulation profile: Secondary | ICD-10-CM | POA: Diagnosis not present

## 2018-06-24 ENCOUNTER — Ambulatory Visit: Payer: Medicare Other | Admitting: Cardiovascular Disease

## 2018-06-24 DIAGNOSIS — E1140 Type 2 diabetes mellitus with diabetic neuropathy, unspecified: Secondary | ICD-10-CM | POA: Diagnosis not present

## 2018-06-24 DIAGNOSIS — E559 Vitamin D deficiency, unspecified: Secondary | ICD-10-CM | POA: Diagnosis not present

## 2018-06-24 DIAGNOSIS — E785 Hyperlipidemia, unspecified: Secondary | ICD-10-CM | POA: Diagnosis not present

## 2018-06-24 DIAGNOSIS — Z79899 Other long term (current) drug therapy: Secondary | ICD-10-CM | POA: Diagnosis not present

## 2018-06-25 NOTE — Telephone Encounter (Signed)
Patient was called to check on status of Eliquis approval. She stated she has not decided whether or not she will take the Eliquis or stay on the coumadin.   The patient did state that she has been having chest pain and shortness of breath for a few weeks now. She is scheduled to see her PCP tomorrow. She stated that the pain is mid chest and hurts intermittently when she is sitting or standing.   Her PCP had prescribed Ranolazine ER but the patient stated that it would cost her over $200 so she could not afford it. The patient is currently taking Imdur 60 mg.  The patient has been set up to see Dr. Fletcher Anon on 07/01/18. She has been advised to go to the ED for her chest pain and stated that she would if it progresses.

## 2018-06-26 DIAGNOSIS — I110 Hypertensive heart disease with heart failure: Secondary | ICD-10-CM | POA: Diagnosis not present

## 2018-06-26 DIAGNOSIS — Z1339 Encounter for screening examination for other mental health and behavioral disorders: Secondary | ICD-10-CM | POA: Diagnosis not present

## 2018-06-26 DIAGNOSIS — E1140 Type 2 diabetes mellitus with diabetic neuropathy, unspecified: Secondary | ICD-10-CM | POA: Diagnosis not present

## 2018-06-26 DIAGNOSIS — I4891 Unspecified atrial fibrillation: Secondary | ICD-10-CM | POA: Diagnosis not present

## 2018-06-26 DIAGNOSIS — E785 Hyperlipidemia, unspecified: Secondary | ICD-10-CM | POA: Diagnosis not present

## 2018-06-26 DIAGNOSIS — R791 Abnormal coagulation profile: Secondary | ICD-10-CM | POA: Diagnosis not present

## 2018-06-26 NOTE — Telephone Encounter (Signed)
Call placed to the patient to check on her. She stated that she was feeling better but will keep her appointment on 07/01/2108 with Dr. Fletcher Anon.

## 2018-06-30 ENCOUNTER — Telehealth: Payer: Self-pay | Admitting: Sports Medicine

## 2018-06-30 NOTE — Telephone Encounter (Signed)
I've been seen recently by Dr. Cannon Kettle and I have an appointment with my heart doctor tomorrow because I think I've been having chest pains. He is with [facility] in [place] and his name is Dr. Kathlyn Sacramento. I didn't know if Dr. Cannon Kettle needed to send any of my records to him. My number is [phone]. Thank you.

## 2018-07-01 ENCOUNTER — Encounter: Payer: Self-pay | Admitting: Cardiovascular Disease

## 2018-07-01 ENCOUNTER — Ambulatory Visit: Payer: Medicare Other | Admitting: Cardiovascular Disease

## 2018-07-01 ENCOUNTER — Ambulatory Visit (INDEPENDENT_AMBULATORY_CARE_PROVIDER_SITE_OTHER): Payer: Medicare Other | Admitting: Cardiovascular Disease

## 2018-07-01 VITALS — BP 136/88 | HR 98 | Ht 64.0 in | Wt 207.4 lb

## 2018-07-01 DIAGNOSIS — I5032 Chronic diastolic (congestive) heart failure: Secondary | ICD-10-CM

## 2018-07-01 DIAGNOSIS — I482 Chronic atrial fibrillation, unspecified: Secondary | ICD-10-CM | POA: Diagnosis not present

## 2018-07-01 DIAGNOSIS — I10 Essential (primary) hypertension: Secondary | ICD-10-CM | POA: Diagnosis not present

## 2018-07-01 DIAGNOSIS — R079 Chest pain, unspecified: Secondary | ICD-10-CM | POA: Diagnosis not present

## 2018-07-01 DIAGNOSIS — I2510 Atherosclerotic heart disease of native coronary artery without angina pectoris: Secondary | ICD-10-CM

## 2018-07-01 NOTE — Patient Instructions (Signed)
Medication Instructions:  Your physician recommends that you continue on your current medications as directed. Please refer to the Current Medication list given to you today.   Labwork: None ordered  Testing/Procedures: Your physician has requested that you have a lexiscan myoview. For further information please visit [email or web address]. Please follow instruction sheet, as given.   Follow-Up: Your physician wants you to follow-up in: 6 months with Dr.Arida You will receive a reminder letter in the mail two months in advance. If you don't receive a letter, please call our office to schedule the follow-up appointment.   Any Other Special Instructions Will Be Listed Below (If Applicable).     If you need a refill on your cardiac medications before your next appointment, please call your pharmacy. Marland Kitchennu

## 2018-07-01 NOTE — Telephone Encounter (Signed)
Thanks

## 2018-07-01 NOTE — Progress Notes (Signed)
Cardiology Office Note   Date:  07/02/2018   ID:  Rebekah Stewart, DOB 04-22-1935, MRN [id number]  PCP:  Nicoletta Dress, MD  Cardiologist:   Kathlyn Sacramento, MD   No chief complaint on file.     History of Present Illness: Rebekah Stewart is a 83 y.o. female who presents for  a followup visit regarding chronic atrial fibrillation, refractory hypertension and diastolic heart failure. She had a cardiac catheterization done in 2010 which showed mild to moderate three-vessel nonobstructive coronary artery disease. Worst stenosis was in the mid LAD which was estimated to be 50%. She has multiple chronic medical conditions including Diabetes and hyperlipidemia. Most recent nuclear stress test in June 2015 was normal.  She is known to have chronic bilateral leg edema  with stasis dermatitis.  She has secondary lymphedema due to chronic venous insufficiency. Lower extremity arterial Doppler in July showed normal ABI bilaterally. The patient could not afford Eliquis due to cost and she is back on warfarin.    She had previous venous ulceration of the lower extremities.  I prescribed lymphedema pump.  Blood pressure was running low during last visit.  I discontinued spironolactone.  She had recent episodes of substernal chest pain which has been intermittent mostly at rest.  She also had some worsening dyspnea.  The dose of furosemide was increased to 40 mg daily for few days by her PCP.  She also has been dealing with throat worsening gout.  She is taking colchicine as needed and is supposed to start allopurinol in the near future.  Past Medical History:  Diagnosis Date  . Arthritis of back    lumbar  . Atrial fibrillation ([place]) 2010  . Atrial fibrillation ([place])   . Atrial fibrillation ([place])   . Chronic diastolic heart failure ([place])   . Colonic polyp   . Compression fracture of thoracic vertebra (HCC)   . Congestive heart failure, unspecified   . Coronary artery disease    mild non  obstructive disease per cardiac cath in 2010  . Degeneration of lumbar or lumbosacral intervertebral disc   . Diverticulosis   . DM2 (diabetes mellitus, type 2) ([place])   . Hemorrhoids   . HLD (hyperlipidemia)   . HTN (hypertension)    diagnosed when she was in her 43s. Refractory. No RAS by [place] in 2010  . Long-term (current) use of anticoagulants   . Lumbosacral spondylosis without myelopathy   . Osteoarthrosis, unspecified whether generalized or localized, unspecified site   . Pain in thoracic spine   . Swelling of limb   . UTI (lower urinary tract infection)   . Varicose veins of lower extremities with other complications   . Vitamin D deficiency     Past Surgical History:  Procedure Laterality Date  . ADENOIDECTOMY    . CARDIAC CATHETERIZATION  2010   At [place]. Mild nonobstructive CAD  . CORONARY ANGIOPLASTY    . EYE SURGERY    . TONSILLECTOMY       Current Outpatient Medications  Medication Sig Dispense Refill  . atorvastatin (LIPITOR) 40 MG tablet Take 40 mg by mouth daily.    . benazepril (LOTENSIN) 20 MG tablet Take 20 mg by mouth daily.     . carvedilol (COREG) 25 MG tablet Take 25 mg by mouth 2 (two) times daily with a meal.    . CHERRY PO Take by mouth.    . Cholecalciferol (VITAMIN D) 2000 UNITS CAPS Take 1  capsule by mouth daily.    . fish oil-omega-3 fatty acids 1000 MG capsule Take 2 g by mouth 2 (two) times daily.     . furosemide (LASIX) 40 MG tablet Take 20 mg by mouth daily.     Marland Kitchen glucose blood (ACCU-CHEK AVIVA PLUS) test strip Check two times daily. Dx code E11.65 100 each 1  . isosorbide mononitrate (IMDUR) 60 MG 24 hr tablet Take 60 mg by mouth daily.      . metFORMIN (GLUCOPHAGE) 500 MG tablet Take 1 tablet (500 mg total) by mouth 3 (three) times daily. (Patient taking differently: Take 500 mg by mouth 2 (two) times daily with a meal. ) 270 tablet 1  . mupirocin ointment (BACTROBAN) 2 % Apply 1 application topically daily.    Marland Kitchen warfarin  (COUMADIN) 5 MG tablet TAKE 1 TABLET BY MOUTH DAILY AND 1.5 TABLETS ON SUNDAY AS DIRECTED  0  . warfarin (COUMADIN) 6 MG tablet Take 6 mg by mouth 2 (two) times a week. Use as directed.    . colchicine 0.6 MG tablet Take 1 tablet (0.6 mg total) by mouth daily. May stop taking if better after 3-4 days (Patient not taking: Reported on 07/01/2018) 7 tablet 0  . lidocaine (XYLOCAINE) 2 % solution      No current facility-administered medications for this visit.     Allergies:   Bee venom; Iodinated diagnostic agents; Penicillins; and Tape    Social History:  The patient  reports that she has never smoked. She has never used smokeless tobacco. She reports that she does not drink alcohol or use drugs.   Family History:  The patient's family history includes Cancer in her sister; Heart attack in her father; Heart disease in her brother, father, mother, and unknown relative; Stroke in her mother.    ROS:  Please see the history of present illness.   Otherwise, review of systems are positive for none.   All other systems are reviewed and negative.    PHYSICAL EXAM: VS:  BP 136/88 (BP Location: Left Arm, Patient Position: Sitting, Cuff Size: Large)   Pulse 98   Ht _0  (1.626 m)   Wt 207 lb 6.4 oz (94.1 kg)   BMI 35.60 kg/m  , BMI Body mass index is 35.6 kg/m. GEN: Well nourished, well developed, in no acute distress  HEENT: normal  Neck: no JVD, carotid bruits, or masses Cardiac: Irregularly irregular ; no murmurs, rubs, or gallops, trace edema Respiratory:  clear to auscultation bilaterally, normal work of breathing GI: soft, nontender, nondistended, + BS MS: no deformity or atrophy  Skin: warm and dry, no rash Neuro:  Strength and sensation are intact Psych: euthymic mood, full affect Dorsalis pedis is palpable bilaterally.  Significant lymphedema with +2 edema and no ulceration.  EKG:  EKG is ordered today. EKG showed atrial fibrillation with ventricular rate of 98 bpm.  Nonspecific  T wave changes.   Recent Labs: No results found for requested labs within last 8760 hours.    Lipid Panel    Component Value Date/Time   LDLCALC 75 03/12/2014      Wt Readings from Last 3 Encounters:  07/01/18 207 lb 6.4 oz (94.1 kg)  04/15/18 208 lb 9.6 oz (94.6 kg)  12/24/17 214 lb (97.1 kg)       ASSESSMENT AND PLAN:  1.  Chronic atrial fibrillation:  Ventricular rate is reasonably controlled on carvedilol.  She is on long-term anticoagulation with warfarin.  She still has  not decided if she wants to go on Eliquis.  She is still concerned about cost.  2. Essential hypertension:  Blood pressure was running low but we stopped spironolactone and blood pressure is now normal.  3. Coronary artery disease involving native coronary arteries with atypical chest pain: Recent episodes of chest pain that has worsened.  I requested a Lexiscan Myoview.  She is not able to exercise on a treadmill.  4. Hyperlipidemia: Continue treatment with atorvastatin.  Most recent LDL was 58.  5. Chronic diastolic heart failure: She appears to be euvolemic on current dose of furosemide.. Recent echocardiogram showed normal LV systolic function with minimal pulmonary hypertension.  Recent labs showed mild hypokalemia which is likely due to stopping spironolactone.  If this continues, consider adding small dose KCl.  6.  Chronic venous insufficiency with secondary lymphedema currently with history venous ulceration.  I encouraged her to continue using lymphedema pump as long as she is not having gout attacks.   Disposition:   FU with me in 6 months  Signed,  Kathlyn Sacramento, MD  07/02/2018 10:18 AM    [place]

## 2018-07-03 ENCOUNTER — Telehealth (HOSPITAL_COMMUNITY): Payer: Self-pay

## 2018-07-03 ENCOUNTER — Encounter (INDEPENDENT_AMBULATORY_CARE_PROVIDER_SITE_OTHER): Payer: Medicare Other | Admitting: Ophthalmology

## 2018-07-03 DIAGNOSIS — H35033 Hypertensive retinopathy, bilateral: Secondary | ICD-10-CM

## 2018-07-03 DIAGNOSIS — I10 Essential (primary) hypertension: Secondary | ICD-10-CM | POA: Diagnosis not present

## 2018-07-03 DIAGNOSIS — H35373 Puckering of macula, bilateral: Secondary | ICD-10-CM | POA: Diagnosis not present

## 2018-07-03 DIAGNOSIS — H348322 Tributary (branch) retinal vein occlusion, left eye, stable: Secondary | ICD-10-CM | POA: Diagnosis not present

## 2018-07-03 DIAGNOSIS — R791 Abnormal coagulation profile: Secondary | ICD-10-CM | POA: Diagnosis not present

## 2018-07-03 DIAGNOSIS — H2511 Age-related nuclear cataract, right eye: Secondary | ICD-10-CM | POA: Diagnosis not present

## 2018-07-03 DIAGNOSIS — H43813 Vitreous degeneration, bilateral: Secondary | ICD-10-CM

## 2018-07-03 DIAGNOSIS — H26492 Other secondary cataract, left eye: Secondary | ICD-10-CM | POA: Diagnosis not present

## 2018-07-03 NOTE — Telephone Encounter (Signed)
Encounter complete.

## 2018-07-04 ENCOUNTER — Ambulatory Visit (HOSPITAL_COMMUNITY)
Admission: RE | Admit: 2018-07-04 | Discharge: 2018-07-04 | Disposition: A | Discharge status: Home / Self Care | Payer: Medicare Other | Source: Ambulatory Visit | Attending: Cardiovascular Disease | Admitting: Cardiovascular Disease

## 2018-07-04 DIAGNOSIS — R079 Chest pain, unspecified: Secondary | ICD-10-CM | POA: Insufficient documentation

## 2018-07-04 LAB — MYOCARDIAL PERFUSION IMAGING
CHL CUP NUCLEAR SDS: 2
CHL CUP NUCLEAR SRS: 1
CHL CUP NUCLEAR SSS: 3
CSEPPHR: 91 {beats}/min
Rest HR: 68 {beats}/min
TID: 1.13

## 2018-07-04 MED ORDER — TECHNETIUM TC 99M TETROFOSMIN IV KIT
31.4000 | PACK | Freq: Once | INTRAVENOUS | Status: AC | PRN
  Administered 2018-07-04: 31.4 via INTRAVENOUS
  Filled 2018-07-04: qty 32

## 2018-07-04 MED ORDER — TECHNETIUM TC 99M TETROFOSMIN IV KIT
10.1000 | PACK | Freq: Once | INTRAVENOUS | Status: AC | PRN
  Administered 2018-07-04: 10.1 via INTRAVENOUS
  Filled 2018-07-04: qty 11

## 2018-07-04 MED ORDER — REGADENOSON 0.4 MG/5ML IV SOLN
0.4000 mg | Freq: Once | INTRAVENOUS | Status: AC
  Administered 2018-07-04: 0.4 mg via INTRAVENOUS

## 2018-07-10 ENCOUNTER — Other Ambulatory Visit: Payer: Self-pay

## 2018-07-10 ENCOUNTER — Ambulatory Visit (INDEPENDENT_AMBULATORY_CARE_PROVIDER_SITE_OTHER): Payer: Medicare Other | Admitting: Sports Medicine

## 2018-07-10 ENCOUNTER — Encounter: Payer: Self-pay | Admitting: Sports Medicine

## 2018-07-10 VITALS — BP 150/84 | HR 83 | Resp 16

## 2018-07-10 DIAGNOSIS — M2042 Other hammer toe(s) (acquired), left foot: Secondary | ICD-10-CM

## 2018-07-10 DIAGNOSIS — B351 Tinea unguium: Secondary | ICD-10-CM

## 2018-07-10 DIAGNOSIS — M2140 Flat foot [pes planus] (acquired), unspecified foot: Secondary | ICD-10-CM

## 2018-07-10 DIAGNOSIS — M2041 Other hammer toe(s) (acquired), right foot: Secondary | ICD-10-CM

## 2018-07-10 DIAGNOSIS — I739 Peripheral vascular disease, unspecified: Secondary | ICD-10-CM

## 2018-07-10 DIAGNOSIS — E1140 Type 2 diabetes mellitus with diabetic neuropathy, unspecified: Secondary | ICD-10-CM

## 2018-07-10 DIAGNOSIS — M79676 Pain in unspecified toe(s): Secondary | ICD-10-CM

## 2018-07-10 DIAGNOSIS — R791 Abnormal coagulation profile: Secondary | ICD-10-CM | POA: Diagnosis not present

## 2018-07-10 DIAGNOSIS — M2010 Hallux valgus (acquired), unspecified foot: Secondary | ICD-10-CM

## 2018-07-10 NOTE — Progress Notes (Signed)
Subjective: Rebekah Stewart is a 83 y.o. female patient with history of diabetes who returns to office today complaining of long, painful nails while ambulating in shoes; unable to trim. Patient states that the glucose reading this morning was not recorded, but was 101 1 month ago with last A1c recorded at 6.2 and she saw her primary care Dr. Lily Kocher 1 month ago.  Patient denies any other pedal complaints.  Patient is also here for pickup of diabetic shoes.  Patient Active Problem List   Diagnosis Date Noted  . Varicose veins of lower extremities with complications, bilateral 10/30/2016  . Pure hypercholesterolemia 05/21/2014  . Bilateral leg pain 11/26/2012  . Chronic diastolic heart failure ([place])   . Atrial fibrillation ([place])   . HTN (hypertension)   . Coronary artery disease    Current Outpatient Medications on File Prior to Visit  Medication Sig Dispense Refill  . atorvastatin (LIPITOR) 40 MG tablet Take 40 mg by mouth daily.    . benazepril (LOTENSIN) 20 MG tablet Take 20 mg by mouth daily.     . carvedilol (COREG) 25 MG tablet Take 25 mg by mouth 2 (two) times daily with a meal.    . CHERRY PO Take by mouth.    . Cholecalciferol (VITAMIN D) 2000 UNITS CAPS Take 1 capsule by mouth daily.    . colchicine 0.6 MG tablet Take 1 tablet (0.6 mg total) by mouth daily. May stop taking if better after 3-4 days 7 tablet 0  . fish oil-omega-3 fatty acids 1000 MG capsule Take 2 g by mouth 2 (two) times daily.     . furosemide (LASIX) 40 MG tablet Take 20 mg by mouth daily.     Marland Kitchen glucose blood (ACCU-CHEK AVIVA PLUS) test strip Check two times daily. Dx code E11.65 100 each 1  . isosorbide mononitrate (IMDUR) 60 MG 24 hr tablet Take 60 mg by mouth daily.      Marland Kitchen lidocaine (XYLOCAINE) 2 % solution     . metFORMIN (GLUCOPHAGE) 500 MG tablet Take 1 tablet (500 mg total) by mouth 3 (three) times daily. (Patient taking differently: Take 500 mg by mouth 2 (two) times daily with a meal. ) 270 tablet 1  .  mupirocin ointment (BACTROBAN) 2 % Apply 1 application topically daily.    Marland Kitchen warfarin (COUMADIN) 5 MG tablet TAKE 1 TABLET BY MOUTH DAILY AND 1.5 TABLETS ON SUNDAY AS DIRECTED  0  . warfarin (COUMADIN) 6 MG tablet Take 6 mg by mouth 2 (two) times a week. Use as directed.     No current facility-administered medications on file prior to visit.    Allergies  Allergen Reactions  . Bee Venom Shortness Of Breath and Swelling  . Iodinated Diagnostic Agents Itching  . Penicillins Swelling  . Tape     Pulls the skin    Recent Results (from the past 2160 hour(s))  Myocardial Perfusion Imaging     Status: None   Collection Time: 07/04/18 12:50 PM  Result Value Ref Range   Rest HR 68 bpm   Rest BP 150/77 mmHg   Peak HR 91 bpm   Peak BP 150/77 mmHg   SSS 3    SRS 1    SDS 2    TID 1.13     Objective: General: Patient is awake, alert, and oriented x 3 and in no acute distress.  Integument: Skin is warm, dry and supple bilateral. Nails are tender, long, thickened and dystrophic with subungual debris,  consistent with onychomycosis, 1-5 bilateral.  No acute signs of infection. No open lesions or preulcerative lesions present bilateral. Remaining integument unremarkable.  Vasculature:  Dorsalis Pedis pulse 1/4 bilateral. Posterior Tibial pulse  0/4 bilateral.  Capillary fill time <5 sec 1-5 bilateral. Diminished hair growth to the level of the digits. Temperature gradient within normal limits.  Moderate varicosities and venous stasis hyperpigmentation with 1+ pitting edema present bilateral.   Neurology: The patient has diminished sensation measured with a 5.07/10g Semmes Weinstein Monofilament at all pedal sites bilateral . Vibratory sensation diminished bilateral with tuning fork. No Babinski sign present bilateral.   Musculoskeletal: No tenderness bilateral. Muscular strength 5/5 in all lower extremity muscular groups bilateral without pain on range of motion. No tenderness with calf  compression bilateral. Asymptomatic Pes planus, bunion and hammertoes bilateral.  Assessment and Plan: Problem List Items Addressed This Visit    None    Visit Diagnoses    Type 2 diabetes, controlled, with neuropathy ([place])    -  Primary   PVD (peripheral vascular disease) ([place])       Acquired hallux valgus, unspecified laterality       Hammer toes of both feet       Acquired flat foot, unspecified laterality       Dermatophytosis of nail       Pain of toe, unspecified laterality         -Examined patient. -Discussed and educated patient on diabetic foot care, especially with regards to the vascular, neurological and musculoskeletal systems.  -Mechanically debrided all nails 1-5 bilateral using sterile nail nipper and filed with dremel without incident  -Diabetic shoes dispense wear and break in explained -Recommend to continue with primary care and cardiology recommendations for edema management -Patient to return  in 3 months for at risk foot care  -Patient advised to call the office if any problems or questions arise in the meantime.  Landis Martins, DPM

## 2018-07-10 NOTE — Patient Instructions (Signed)

## 2018-07-10 NOTE — Progress Notes (Signed)
Patient ID: Rebekah Stewart, female   DOB: 25-Nov-1934, 83 y.o.   MRN: [id number]   Patient presents for diabetic shoe pick up, shoes are tried on for good fit.  Patient received 1 pair [employer]pex Women's - Emmy Tan - [id number] in 9.5 wide and 3 pairs custom molded diabetic inserts.  Verbal and written break in and wear instructions given.

## 2018-07-11 ENCOUNTER — Ambulatory Visit: Payer: Medicare Other | Admitting: Sports Medicine

## 2018-07-12 DIAGNOSIS — I4891 Unspecified atrial fibrillation: Secondary | ICD-10-CM | POA: Diagnosis not present

## 2018-07-12 DIAGNOSIS — M542 Cervicalgia: Secondary | ICD-10-CM | POA: Diagnosis not present

## 2018-07-17 DIAGNOSIS — R791 Abnormal coagulation profile: Secondary | ICD-10-CM | POA: Diagnosis not present

## 2018-07-24 DIAGNOSIS — R791 Abnormal coagulation profile: Secondary | ICD-10-CM | POA: Diagnosis not present

## 2018-07-31 DIAGNOSIS — R791 Abnormal coagulation profile: Secondary | ICD-10-CM | POA: Diagnosis not present

## 2018-08-06 ENCOUNTER — Encounter (INDEPENDENT_AMBULATORY_CARE_PROVIDER_SITE_OTHER): Payer: Medicare Other | Admitting: Ophthalmology

## 2018-08-06 DIAGNOSIS — H2702 Aphakia, left eye: Secondary | ICD-10-CM

## 2018-08-08 DIAGNOSIS — D692 Other nonthrombocytopenic purpura: Secondary | ICD-10-CM | POA: Diagnosis not present

## 2018-08-08 DIAGNOSIS — I8312 Varicose veins of left lower extremity with inflammation: Secondary | ICD-10-CM | POA: Diagnosis not present

## 2018-08-08 DIAGNOSIS — I872 Venous insufficiency (chronic) (peripheral): Secondary | ICD-10-CM | POA: Diagnosis not present

## 2018-08-08 DIAGNOSIS — I8311 Varicose veins of right lower extremity with inflammation: Secondary | ICD-10-CM | POA: Diagnosis not present

## 2018-08-09 ENCOUNTER — Inpatient Hospital Stay (HOSPITAL_COMMUNITY)
Admission: EM | Admit: 2018-08-09 | Discharge: 2018-08-13 | DRG: 292 | Disposition: A | Discharge status: Home / Self Care | Payer: Medicare Other | Attending: Student in an Organized Health Care Education/Training Program | Admitting: Student in an Organized Health Care Education/Training Program

## 2018-08-09 ENCOUNTER — Other Ambulatory Visit: Payer: Self-pay

## 2018-08-09 ENCOUNTER — Encounter (HOSPITAL_COMMUNITY): Payer: Self-pay

## 2018-08-09 ENCOUNTER — Inpatient Hospital Stay (HOSPITAL_COMMUNITY): Payer: Medicare Other

## 2018-08-09 ENCOUNTER — Emergency Department (HOSPITAL_COMMUNITY): Payer: Medicare Other

## 2018-08-09 DIAGNOSIS — I10 Essential (primary) hypertension: Secondary | ICD-10-CM | POA: Diagnosis present

## 2018-08-09 DIAGNOSIS — I272 Pulmonary hypertension, unspecified: Secondary | ICD-10-CM | POA: Diagnosis present

## 2018-08-09 DIAGNOSIS — Z79899 Other long term (current) drug therapy: Secondary | ICD-10-CM | POA: Diagnosis not present

## 2018-08-09 DIAGNOSIS — Z823 Family history of stroke: Secondary | ICD-10-CM

## 2018-08-09 DIAGNOSIS — Z880 Allergy status to penicillin: Secondary | ICD-10-CM

## 2018-08-09 DIAGNOSIS — Z86010 Personal history of colonic polyps: Secondary | ICD-10-CM

## 2018-08-09 DIAGNOSIS — I4581 Long QT syndrome: Secondary | ICD-10-CM | POA: Diagnosis not present

## 2018-08-09 DIAGNOSIS — I2510 Atherosclerotic heart disease of native coronary artery without angina pectoris: Secondary | ICD-10-CM | POA: Diagnosis not present

## 2018-08-09 DIAGNOSIS — E1100 Type 2 diabetes mellitus with hyperosmolarity without nonketotic hyperglycemic-hyperosmolar coma (NKHHC): Secondary | ICD-10-CM

## 2018-08-09 DIAGNOSIS — Z7901 Long term (current) use of anticoagulants: Secondary | ICD-10-CM | POA: Diagnosis not present

## 2018-08-09 DIAGNOSIS — M10472 Other secondary gout, left ankle and foot: Secondary | ICD-10-CM

## 2018-08-09 DIAGNOSIS — Z91030 Bee allergy status: Secondary | ICD-10-CM

## 2018-08-09 DIAGNOSIS — M109 Gout, unspecified: Secondary | ICD-10-CM | POA: Diagnosis present

## 2018-08-09 DIAGNOSIS — J9811 Atelectasis: Secondary | ICD-10-CM | POA: Diagnosis present

## 2018-08-09 DIAGNOSIS — M542 Cervicalgia: Secondary | ICD-10-CM | POA: Diagnosis not present

## 2018-08-09 DIAGNOSIS — L304 Erythema intertrigo: Secondary | ICD-10-CM | POA: Diagnosis present

## 2018-08-09 DIAGNOSIS — I361 Nonrheumatic tricuspid (valve) insufficiency: Secondary | ICD-10-CM | POA: Diagnosis not present

## 2018-08-09 DIAGNOSIS — J810 Acute pulmonary edema: Secondary | ICD-10-CM | POA: Diagnosis not present

## 2018-08-09 DIAGNOSIS — R0902 Hypoxemia: Secondary | ICD-10-CM | POA: Diagnosis present

## 2018-08-09 DIAGNOSIS — I4891 Unspecified atrial fibrillation: Secondary | ICD-10-CM | POA: Diagnosis present

## 2018-08-09 DIAGNOSIS — R0602 Shortness of breath: Secondary | ICD-10-CM | POA: Diagnosis not present

## 2018-08-09 DIAGNOSIS — I878 Other specified disorders of veins: Secondary | ICD-10-CM | POA: Diagnosis not present

## 2018-08-09 DIAGNOSIS — IMO0002 Reserved for concepts with insufficient information to code with codable children: Secondary | ICD-10-CM

## 2018-08-09 DIAGNOSIS — I2729 Other secondary pulmonary hypertension: Secondary | ICD-10-CM | POA: Diagnosis not present

## 2018-08-09 DIAGNOSIS — I160 Hypertensive urgency: Secondary | ICD-10-CM

## 2018-08-09 DIAGNOSIS — F423 Hoarding disorder: Secondary | ICD-10-CM | POA: Diagnosis not present

## 2018-08-09 DIAGNOSIS — I110 Hypertensive heart disease with heart failure: Principal | ICD-10-CM | POA: Diagnosis present

## 2018-08-09 DIAGNOSIS — I341 Nonrheumatic mitral (valve) prolapse: Secondary | ICD-10-CM | POA: Diagnosis not present

## 2018-08-09 DIAGNOSIS — E1165 Type 2 diabetes mellitus with hyperglycemia: Secondary | ICD-10-CM

## 2018-08-09 DIAGNOSIS — I5033 Acute on chronic diastolic (congestive) heart failure: Secondary | ICD-10-CM

## 2018-08-09 DIAGNOSIS — M79675 Pain in left toe(s): Secondary | ICD-10-CM

## 2018-08-09 DIAGNOSIS — Z8249 Family history of ischemic heart disease and other diseases of the circulatory system: Secondary | ICD-10-CM | POA: Diagnosis not present

## 2018-08-09 DIAGNOSIS — E785 Hyperlipidemia, unspecified: Secondary | ICD-10-CM | POA: Diagnosis not present

## 2018-08-09 DIAGNOSIS — Z9861 Coronary angioplasty status: Secondary | ICD-10-CM

## 2018-08-09 DIAGNOSIS — I482 Chronic atrial fibrillation, unspecified: Secondary | ICD-10-CM | POA: Diagnosis present

## 2018-08-09 DIAGNOSIS — Z91048 Other nonmedicinal substance allergy status: Secondary | ICD-10-CM | POA: Diagnosis not present

## 2018-08-09 DIAGNOSIS — E119 Type 2 diabetes mellitus without complications: Secondary | ICD-10-CM

## 2018-08-09 DIAGNOSIS — I509 Heart failure, unspecified: Secondary | ICD-10-CM | POA: Diagnosis not present

## 2018-08-09 DIAGNOSIS — J90 Pleural effusion, not elsewhere classified: Secondary | ICD-10-CM | POA: Diagnosis not present

## 2018-08-09 DIAGNOSIS — R400 Somnolence: Secondary | ICD-10-CM | POA: Diagnosis not present

## 2018-08-09 LAB — CBC WITH DIFFERENTIAL/PLATELET
Abs Immature Granulocytes: 0.05 10*3/uL (ref 0.00–0.07)
BASOS PCT: 0 %
Basophils Absolute: 0 10*3/uL (ref 0.0–0.1)
Eosinophils Absolute: 0.2 10*3/uL (ref 0.0–0.5)
Eosinophils Relative: 2 %
HCT: 46 % (ref 36.0–46.0)
Hemoglobin: 14.2 g/dL (ref 12.0–15.0)
IMMATURE GRANULOCYTES: 1 %
Lymphocytes Relative: 11 %
Lymphs Abs: 1.1 10*3/uL (ref 0.7–4.0)
MCH: 29.7 pg (ref 26.0–34.0)
MCHC: 30.9 g/dL (ref 30.0–36.0)
MCV: 96.2 fL (ref 80.0–100.0)
Monocytes Absolute: 0.5 10*3/uL (ref 0.1–1.0)
Monocytes Relative: 5 %
NEUTROS ABS: 8.3 10*3/uL — AB (ref 1.7–7.7)
NEUTROS PCT: 81 %
NRBC: 0 % (ref 0.0–0.2)
PLATELETS: 332 10*3/uL (ref 150–400)
RBC: 4.78 MIL/uL (ref 3.87–5.11)
RDW: 14.3 % (ref 11.5–15.5)
WBC: 10.2 10*3/uL (ref 4.0–10.5)

## 2018-08-09 LAB — BASIC METABOLIC PANEL
ANION GAP: 13 (ref 5–15)
BUN: 12 mg/dL (ref 8–23)
CHLORIDE: 102 mmol/L (ref 98–111)
CO2: 28 mmol/L (ref 22–32)
CREATININE: 0.93 mg/dL (ref 0.44–1.00)
Calcium: 8.7 mg/dL — ABNORMAL LOW (ref 8.9–10.3)
GFR calc non Af Amer: 56 mL/min — ABNORMAL LOW (ref >60)
Glucose, Bld: 156 mg/dL — ABNORMAL HIGH (ref 70–99)
Potassium: 3 mmol/L — ABNORMAL LOW (ref 3.5–5.1)
SODIUM: 143 mmol/L (ref 135–145)

## 2018-08-09 LAB — ECHOCARDIOGRAM COMPLETE
Height: 64 in
WEIGHTICAEL: 3312 [oz_av]

## 2018-08-09 LAB — PROTIME-INR
INR: 2.08
PROTHROMBIN TIME: 23.1 s — AB (ref 11.4–15.2)

## 2018-08-09 LAB — HEMOGLOBIN A1C
HEMOGLOBIN A1C: 6.5 % — AB (ref 4.8–5.6)
MEAN PLASMA GLUCOSE: 139.85 mg/dL

## 2018-08-09 LAB — TROPONIN I
Troponin I: <0.03 ng/mL (ref <0.03)
Troponin I: <0.03 ng/mL (ref <0.03)

## 2018-08-09 LAB — BRAIN NATRIURETIC PEPTIDE: B NATRIURETIC PEPTIDE 5: 356.8 pg/mL — AB (ref 0.0–100.0)

## 2018-08-09 LAB — TSH: TSH: 2.312 u[IU]/mL (ref 0.350–4.500)

## 2018-08-09 LAB — CBG MONITORING, ED: GLUCOSE-CAPILLARY: 153 mg/dL — AB (ref 70–99)

## 2018-08-09 LAB — GLUCOSE, CAPILLARY
GLUCOSE-CAPILLARY: 103 mg/dL — AB (ref 70–99)
Glucose-Capillary: 133 mg/dL — ABNORMAL HIGH (ref 70–99)

## 2018-08-09 MED ORDER — ISOSORBIDE MONONITRATE ER 60 MG PO TB24
60.0000 mg | ORAL_TABLET | Freq: Every day | ORAL | Status: DC
  Administered 2018-08-10 – 2018-08-13 (×4): 60 mg via ORAL
  Filled 2018-08-09 (×4): qty 1

## 2018-08-09 MED ORDER — HYDROCERIN EX CREA
TOPICAL_CREAM | Freq: Two times a day (BID) | CUTANEOUS | Status: DC | PRN
  Administered 2018-08-09: 20:00:00 via TOPICAL
  Administered 2018-08-10 – 2018-08-11 (×2): 1 via TOPICAL
  Administered 2018-08-12 (×2): via TOPICAL
  Filled 2018-08-09: qty 113

## 2018-08-09 MED ORDER — BENAZEPRIL HCL 20 MG PO TABS
20.0000 mg | ORAL_TABLET | Freq: Every day | ORAL | Status: DC
  Administered 2018-08-10 – 2018-08-12 (×3): 20 mg via ORAL
  Filled 2018-08-09 (×4): qty 1

## 2018-08-09 MED ORDER — POTASSIUM CHLORIDE CRYS ER 20 MEQ PO TBCR
40.0000 meq | EXTENDED_RELEASE_TABLET | Freq: Two times a day (BID) | ORAL | Status: AC
  Administered 2018-08-09: 40 meq via ORAL
  Filled 2018-08-09 (×2): qty 2

## 2018-08-09 MED ORDER — CARVEDILOL 25 MG PO TABS
25.0000 mg | ORAL_TABLET | Freq: Two times a day (BID) | ORAL | Status: DC
  Filled 2018-08-09: qty 1

## 2018-08-09 MED ORDER — BENAZEPRIL HCL 20 MG PO TABS: 20.0000 mg | ORAL_TABLET | Freq: Every day | ORAL | Status: DC

## 2018-08-09 MED ORDER — WARFARIN - PHYSICIAN DOSING INPATIENT: Freq: Every day | Status: DC

## 2018-08-09 MED ORDER — INSULIN ASPART 100 UNIT/ML ~~LOC~~ SOLN
0.0000 [IU] | Freq: Three times a day (TID) | SUBCUTANEOUS | Status: DC
  Administered 2018-08-10: 1 [IU] via SUBCUTANEOUS
  Administered 2018-08-10 – 2018-08-11 (×2): 2 [IU] via SUBCUTANEOUS
  Administered 2018-08-11: 1 [IU] via SUBCUTANEOUS
  Administered 2018-08-11: 2 [IU] via SUBCUTANEOUS
  Administered 2018-08-12: 1 [IU] via SUBCUTANEOUS
  Administered 2018-08-12 (×2): 2 [IU] via SUBCUTANEOUS

## 2018-08-09 MED ORDER — INSULIN ASPART 100 UNIT/ML ~~LOC~~ SOLN: 0.0000 [IU] | Freq: Every day | SUBCUTANEOUS | Status: DC

## 2018-08-09 MED ORDER — ASPIRIN 81 MG PO CHEW
324.0000 mg | CHEWABLE_TABLET | Freq: Once | ORAL | Status: AC
  Administered 2018-08-09: 324 mg via ORAL
  Filled 2018-08-09: qty 4

## 2018-08-09 MED ORDER — POTASSIUM CHLORIDE CRYS ER 20 MEQ PO TBCR
40.0000 meq | EXTENDED_RELEASE_TABLET | Freq: Once | ORAL | Status: AC
  Administered 2018-08-09: 40 meq via ORAL
  Filled 2018-08-09: qty 2

## 2018-08-09 MED ORDER — WARFARIN SODIUM 2 MG PO TABS
4.0000 mg | ORAL_TABLET | Freq: Once | ORAL | Status: DC
  Filled 2018-08-09: qty 2

## 2018-08-09 MED ORDER — FUROSEMIDE 10 MG/ML IJ SOLN
40.0000 mg | Freq: Once | INTRAMUSCULAR | Status: AC
  Administered 2018-08-09: 40 mg via INTRAVENOUS
  Filled 2018-08-09: qty 4

## 2018-08-09 MED ORDER — NITROGLYCERIN 0.4 MG SL SUBL
0.4000 mg | SUBLINGUAL_TABLET | SUBLINGUAL | Status: AC | PRN
  Administered 2018-08-09 (×3): 0.4 mg via SUBLINGUAL
  Filled 2018-08-09: qty 1

## 2018-08-09 MED ORDER — ATORVASTATIN CALCIUM 40 MG PO TABS
40.0000 mg | ORAL_TABLET | Freq: Every day | ORAL | Status: DC
  Administered 2018-08-09 – 2018-08-12 (×4): 40 mg via ORAL
  Filled 2018-08-09 (×6): qty 1

## 2018-08-09 MED ORDER — TRIAMCINOLONE ACETONIDE 0.1 % EX CREA
1.0000 "application " | TOPICAL_CREAM | Freq: Two times a day (BID) | CUTANEOUS | Status: DC
  Administered 2018-08-09 – 2018-08-13 (×8): 1 via TOPICAL
  Filled 2018-08-09: qty 15

## 2018-08-09 MED ORDER — SODIUM CHLORIDE 0.9% FLUSH
3.0000 mL | Freq: Two times a day (BID) | INTRAVENOUS | Status: DC
  Administered 2018-08-09 – 2018-08-13 (×7): 3 mL via INTRAVENOUS

## 2018-08-09 MED ORDER — CARVEDILOL 25 MG PO TABS
25.0000 mg | ORAL_TABLET | Freq: Two times a day (BID) | ORAL | Status: DC
  Administered 2018-08-09 – 2018-08-13 (×8): 25 mg via ORAL
  Filled 2018-08-09 (×6): qty 1
  Filled 2018-08-09: qty 2
  Filled 2018-08-09: qty 1

## 2018-08-09 MED ORDER — ACETAMINOPHEN 650 MG RE SUPP: 650.0000 mg | Freq: Four times a day (QID) | RECTAL | Status: DC | PRN

## 2018-08-09 MED ORDER — ACETAMINOPHEN 325 MG PO TABS: 650.0000 mg | ORAL_TABLET | Freq: Four times a day (QID) | ORAL | Status: DC | PRN

## 2018-08-09 MED ORDER — WARFARIN SODIUM 2 MG PO TABS
4.0000 mg | ORAL_TABLET | Freq: Every day | ORAL | Status: DC
  Administered 2018-08-09 – 2018-08-11 (×3): 4 mg via ORAL
  Filled 2018-08-09 (×3): qty 2

## 2018-08-09 NOTE — Progress Notes (Signed)
Pt heart rate sustaining in 150s  EKG completed at bedside  Pt non symptomatic  Vitals documented   Ordered pt dinner per request

## 2018-08-09 NOTE — Progress Notes (Signed)
Pt states she feels light headed  Pt vital signs documented  Pt states she feels better resting in bed at this time  Pt refusing medications at this time, offered tylenol as well

## 2018-08-09 NOTE — ED Notes (Signed)
ED Provider at bedside. 

## 2018-08-09 NOTE — ED Triage Notes (Signed)
Pt states she has had shortness of breath for a at least a month. Pt reports chest pain at this time as well. Pt is 77% on room air on arrival. Pt is 92% on 2L.

## 2018-08-09 NOTE — ED Notes (Signed)
Does not use home oxygen-[place]

## 2018-08-09 NOTE — H&P (Signed)
Date: 08/09/2018               Patient Name:  Rebekah Stewart MRN: [id number]  DOB: [birth year] Age / Sex: 83 y.o., female   PCP: Nicoletta Dress, MD         Medical Service: Internal Medicine Teaching Service         Attending Physician: Dr. Oval Linsey, MD    First Contact: Dr. Sharon Seller Pager: [phone]  Second Contact: Dr. Frederico Hamman Pager: [phone]       After Hours (After 5p/  First Contact Pager: [phone]  weekends / holidays): Second Contact Pager: [phone]   Chief Complaint: shortness of breath   History of Present Illness:  Mrs. Dedominicis is an 83 yo female w PMH a.fib, CAD w/cardiac cath 2010, HFpEF, HTN, TIIDM, and gout presenting to [facility] ED with SOB that has been worsening for the past month that acutely worsened in the last few days with chest pressure and tightness. She states she has had recent intermittent random CP where she will feel pressure and tightness around her chest. It is worse when she bends forward to pick things up. There is occasional left arm pain, but she is not sure if this is associated with her chest pain She has had a mild non-productive cough. SOB is worse on exertion. For the past week she has had difficulty even walking twenty feet, normally ambulates on her own and volunteers a few times a week. She has noticed increased swelling in her legs and doubled her normal dose of lasix last night and has had increased urination as a result. She does not use supplemental O2.  She has not had any recent illness besides recent diagnosis of gout, denies sick contacts. She is able to perform home ADLs. She is accompanied by her son and niece who stated they are worried about her. She is very adamant that she does not want anyone else coming to her house for PT/OT. She continues to drive and up until one week ago was still volunteering although this was very difficult. She states she eats out a lot and enjoys a high salt diet.   I spoke with the son afterward alone who  stated that she has an issue with hoarding and has a lot of fall risk factors at home. He stated that PT/OT could potentially be done at SNF, but that even if she were to agree with home PT/OT she would later cancel these appointments as she done during previous hospitalizations.   In the ED sats were 70% on arrival, bp 213/110, resp 26, other vitals were wnl. She received nitro, asa 325, and was placed on 2.5L supplemental O2. She received nitro and lasix 78m IV.   Meds:  Current Meds  Medication Sig  . allopurinol (ZYLOPRIM) 300 MG tablet Take 300 mg by mouth daily as needed (for gout).   .Marland Kitchenatorvastatin (LIPITOR) 40 MG tablet Take 40 mg by mouth daily.  . benazepril (LOTENSIN) 20 MG tablet Take 20 mg by mouth daily.   . carvedilol (COREG) 25 MG tablet Take 25 mg by mouth 2 (two) times daily with a meal.  . CHERRY PO Take by mouth.  . Cholecalciferol (VITAMIN D) 2000 UNITS CAPS Take 1 capsule by mouth daily.  . colchicine 0.6 MG tablet Take 1 tablet (0.6 mg total) by mouth daily. May stop taking if better after 3-4 days  . fish oil-omega-3 fatty acids 1000 MG capsule Take 2 g  by mouth 2 (two) times daily.   . furosemide (LASIX) 20 MG tablet Take 20 mg by mouth daily.   . isosorbide mononitrate (IMDUR) 60 MG 24 hr tablet Take 60 mg by mouth daily.    . metFORMIN (GLUCOPHAGE) 500 MG tablet Take 1 tablet (500 mg total) by mouth 3 (three) times daily. (Patient taking differently: Take 500 mg by mouth 3 (three) times daily with meals. )  . triamcinolone cream (KENALOG) 0.1 % Apply 1 application topically 2 (two) times daily.  Marland Kitchen warfarin (COUMADIN) 4 MG tablet Take 4 mg by mouth one time only at 6 PM. Use as directed.      Allergies: Allergies as of 08/09/2018 - Review Complete 08/09/2018  Allergen Reaction Noted  . Bee venom Shortness Of Breath and Swelling 04/23/2014  . Iodinated diagnostic agents Itching 05/03/2011  . Penicillins Swelling 05/03/2011  . Tape  08/13/2013   Past Medical  History:  Diagnosis Date  . Arthritis of back    lumbar  . Atrial fibrillation ([place]) 2010  . Atrial fibrillation ([place])   . Atrial fibrillation ([place])   . Chronic diastolic heart failure ([place])   . Colonic polyp   . Compression fracture of thoracic vertebra ([facility])   . Congestive heart failure, unspecified   . Coronary artery disease    mild non obstructive disease per cardiac cath in 2010  . Degeneration of lumbar or lumbosacral intervertebral disc   . Diverticulosis   . DM2 (diabetes mellitus, type 2) ([place])   . Hemorrhoids   . HLD (hyperlipidemia)   . HTN (hypertension)    diagnosed when she was in her 53s. Refractory. No RAS by [place] in 2010  . Long-term (current) use of anticoagulants   . Lumbosacral spondylosis without myelopathy   . Osteoarthrosis, unspecified whether generalized or localized, unspecified site   . Pain in thoracic spine   . Swelling of limb   . UTI (lower urinary tract infection)   . Varicose veins of lower extremities with other complications   . Vitamin D deficiency     Family History: Family History  Problem Relation Age of Onset  . Stroke Mother   . Heart disease Mother   . Heart attack Father   . Heart disease Father   . Heart disease Unknown   . Cancer Sister   . Heart disease Brother     Social History:  She lives alone at her home in [place] and does not smoke or drink alcohol Her son and niece live nearby and daughter lives in [place].  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  She has a history of hoarding per her son, and he worries about her falling as the pathways through her house are very narrow. She does not have central heating or AC and does not really cook at home but eats out a lot.    Review of Systems: A complete ROS was negative except as per HPI.   Physical Exam: Blood pressure (!) 162/82, pulse 98, temperature 97.9 F (36.6 C), temperature source Oral, resp. rate (!) 26, height _0  (1.626 m), weight 93.9  kg, SpO2 97 %.  Constitution: NAD, sitting in bed  HEENT: [place] in place, EOM intact, no scleral icterus Cardio: irregular rate and rhythm, +2 L pedal pulse, +1 R pedal pulses, No JVD Respiratory: mild left lower lobe crackles, decreased breath sounds, otherwise CTA, no wheezing Abdominal: NTTP, soft, non-distended MSK: moving all extremities GU: purewick Neuro: a&ox3, pleasant, cooperative  Skin: LE stasis dermatitis L>R w/some erythema, no warmth. +1 pitting edema   EKG: personally reviewed my interpretation is prolonged QTc, irregular rate and rhythm  CXR: personally reviewed my interpretation is left pleural effusion and diffuse interstitial edema.  Assessment & Plan by Problem: Active Problems:   Flash pulmonary edema ([place])  83 yo female w PMH a.fib, CAD w/cardiac cath 2010, HFpEF, HTN, TIIDM, and gout presenting to [facility] ED with SOB that has been worsening for the past month.   Flash Pulmonary Edema  Acute on Chronic Diastolic Heart Failure Increasing SOB and LE swelling with intermittent, random CP for the past month. Acutely worsened in the past few days so that she can barely walk across a room. She doubled her lasix to 62m yesterday and received 439mIV in the ED. BNP 356. Chest x-ray shows L pleural effusion and increased interstitial opacity suggesting pulmonary edema. 10/19 stress test was without ST elevation or signs of ischemia. It is likely she has been having worsening heart failure exacerbation with increasing blood pressure causing flash pulmonary edema. This could also be secondary to her atrial fibrillation, which is rate controlled only and also may explain her chest tightness that appears to be occurring at random intervals.    - echo ordered  - troponinx3 - lasix 40 mg qd - am BMP, Mg - telemetry  - PT/OT - placed on fall precautions  - daily weights, strict I/O's - repeat EKG  Atrial Fibrillation On coumadin at home and rate controlled with coreg.   -  cont. Coumadin   - cont. Coreg 25 mg bid  CAD Hypertension Blood pressure 162/82. Did not take am medications today.    - continue home coreg 25 mg bid, benazepril 20 mg qd, Imdur 6062m cont. lipitor  TIIDM On metformin at home   - Hbg A1c - SSI sensitive  Gout Recently diagnosed. Allopurinol prescription is PRN. Will confirm with patient that she is taking daily.   Diet: heart healthy/carb modified VTE:  IVF: Code:  Dispo: Admit patient to Inpatient with expected length of stay greater than 2 midnights.  Signed: SMarty HeckO 08/09/2018, 3:01 PM  Pager: [phone]

## 2018-08-09 NOTE — Progress Notes (Signed)
Pt son pulled RN to the side  Stated the patient is a severe hoarder and he is concerned home health would not be able to get in the house  Pt son stated pt has no central air/heat Pt son would like to speak to case manager / Education officer, museum in person tomorrow -pt son number listed in chart if need to contact-  Pt son stated they are willing to do out patient rehab if needed

## 2018-08-09 NOTE — Progress Notes (Signed)
Pt concerned for wound care tonight  Verified pt home medications of kenalog and pt states she mixes it with Alexis Frock  Paged MD to inform  MD aware and stated she will review

## 2018-08-09 NOTE — ED Notes (Signed)
Admitting at bedside 

## 2018-08-09 NOTE — Progress Notes (Signed)
Pt states she takes all her medications at bedtime- 10pm   Once pt settled in bed and placed on [place] 2L, heart rate afib rate 85  EKG in chart

## 2018-08-09 NOTE — Progress Notes (Addendum)
Messaged IM Resident : Pt wants to take her Benazapril at night NOW as she takes this in am at home, do you want to give her at night or remain in am?

## 2018-08-09 NOTE — Progress Notes (Signed)
  Echocardiogram 2D Echocardiogram has been performed.  Rebekah Stewart 08/09/2018, 3:44 PM

## 2018-08-09 NOTE — ED Notes (Addendum)
ECHO in progress-

## 2018-08-09 NOTE — ED Provider Notes (Signed)
[facility] Provider Note   CSN: [id number] Arrival date & time: 08/09/18  0957     History   Chief Complaint Chief Complaint  Patient presents with  . Shortness of Breath    HPI Rebekah Stewart is a 83 y.o. female.  HPI  83 year old female with a history of atrial fibrillation, CHF, hypertension, and diabetes presents with shortness of breath.  She is been feeling short of breath for multiple weeks.  It has progressively worsened.  She is also had on and off chest pain that she describes as a heaviness.  Currently she rates this heaviness is a 4 out of 10 and has been having this since last night.  She is noticed some swelling in her legs that is a little improved this morning because she elevate her legs all night.  She noticed some redness that is not painful, itching, or burning to her right lower leg that she was told was dermatitis by dermatologist a few days ago.  No unilateral leg swelling.  She has a trace cough for these past couple weeks but denies any productive sputum or fever.  When in triage she was found to have O2 sats of 70%.  She was placed on 2 L oxygen.  She does not wear oxygen at home.  She did not take her medicines this morning.  Past Medical History:  Diagnosis Date  . Arthritis of back    lumbar  . Atrial fibrillation ([place]) 2010  . Atrial fibrillation ([place])   . Atrial fibrillation ([place])   . Chronic diastolic heart failure ([place])   . Colonic polyp   . Compression fracture of thoracic vertebra (HCC)   . Congestive heart failure, unspecified   . Coronary artery disease    mild non obstructive disease per cardiac cath in 2010  . Degeneration of lumbar or lumbosacral intervertebral disc   . Diverticulosis   . DM2 (diabetes mellitus, type 2) ([place])   . Hemorrhoids   . HLD (hyperlipidemia)   . HTN (hypertension)    diagnosed when she was in her 77s. Refractory. No RAS by [place] in 2010  . Long-term (current) use of  anticoagulants   . Lumbosacral spondylosis without myelopathy   . Osteoarthrosis, unspecified whether generalized or localized, unspecified site   . Pain in thoracic spine   . Swelling of limb   . UTI (lower urinary tract infection)   . Varicose veins of lower extremities with other complications   . Vitamin D deficiency     Patient Active Problem List   Diagnosis Date Noted  . Varicose veins of lower extremities with complications, bilateral 10/30/2016  . Pure hypercholesterolemia 05/21/2014  . Bilateral leg pain 11/26/2012  . Chronic diastolic heart failure ([place])   . Atrial fibrillation ([place])   . HTN (hypertension)   . Coronary artery disease     Past Surgical History:  Procedure Laterality Date  . ADENOIDECTOMY    . CARDIAC CATHETERIZATION  2010   At [facility]. Mild nonobstructive CAD  . CORONARY ANGIOPLASTY    . EYE SURGERY    . TONSILLECTOMY       OB History   None      Home Medications    Prior to Admission medications   Medication Sig Start Date End Date Taking? Authorizing Provider  allopurinol (ZYLOPRIM) 300 MG tablet Take 300 mg by mouth daily. 06/26/18   [provider]  atorvastatin (LIPITOR) 40 MG tablet Take 40  mg by mouth daily.    [provider]  benazepril (LOTENSIN) 20 MG tablet Take 20 mg by mouth daily.     [provider]  carvedilol (COREG) 25 MG tablet Take 25 mg by mouth 2 (two) times daily with a meal.    [provider]  CHERRY PO Take by mouth.    [provider]  Cholecalciferol (VITAMIN D) 2000 UNITS CAPS Take 1 capsule by mouth daily.    [provider]  colchicine 0.6 MG tablet Take 1 tablet (0.6 mg total) by mouth daily. May stop taking if better after 3-4 days 05/30/18   Landis Martins, DPM  fish oil-omega-3 fatty acids 1000 MG capsule Take 2 g by mouth 2 (two) times daily.     [provider]  furosemide (LASIX) 40 MG tablet Take 20 mg by mouth daily.     [provider]  glucose blood (ACCU-CHEK AVIVA PLUS) test strip Check two times daily. Dx code E11.65 07/27/14   Elayne Snare, MD  isosorbide mononitrate (IMDUR) 60 MG 24 hr tablet Take 60 mg by mouth daily.      [provider]  lidocaine (XYLOCAINE) 2 % solution  06/19/17   [provider]  metFORMIN (GLUCOPHAGE) 500 MG tablet Take 1 tablet (500 mg total) by mouth 3 (three) times daily. Patient taking differently: Take 500 mg by mouth 2 (two) times daily with a meal.  06/24/14   Elayne Snare, MD  mupirocin ointment (BACTROBAN) 2 % Apply 1 application topically daily. 12/18/17   [provider]  prednisoLONE acetate (PRED FORTE) 1 % ophthalmic suspension INSTILL 1 DROP IN THE LEFT EYE 4 TIMES A DAY FOR 1 WEEK THEN DISCONTINUE 07/03/18   [provider]  warfarin (COUMADIN) 5 MG tablet TAKE 1 TABLET BY MOUTH DAILY AND 1.5 TABLETS ON SUNDAY AS DIRECTED 09/04/16   [provider]  warfarin (COUMADIN) 6 MG tablet Take 6 mg by mouth 2 (two) times a week. Use as directed.    [provider]    Family History Family History  Problem Relation Age of Onset  . Stroke Mother   . Heart disease Mother   . Heart attack Father   . Heart disease Father   . Heart disease Unknown   . Cancer Sister   . Heart disease Brother     Social History Social History   Tobacco Use  . Smoking status: Never Smoker  . Smokeless tobacco: Never Used  Substance Use Topics  . Alcohol use: No  . Drug use: No     Allergies   Bee venom; Iodinated diagnostic agents; Penicillins; and Tape   Review of Systems Review of Systems  Respiratory: Positive for cough and shortness of breath.   Cardiovascular: Positive for chest pain and leg swelling.  Gastrointestinal: Negative for abdominal pain.  All other systems reviewed and are negative.    Physical Exam Updated Vital Signs BP (!) 213/110 (BP Location: Left Arm)   Pulse 90   Temp 97.9 F (36.6 C) (Oral)    Resp (!) 26   Ht _0  (1.626 m)   Wt 93.9 kg   SpO2 97%   BMI 35.53 kg/m   Physical Exam  Constitutional: She appears well-developed and well-nourished.  Non-toxic appearance. She does not appear ill. No distress.  HENT:  Head: Normocephalic and atraumatic.  Right Ear: External ear normal.  Left Ear: External ear normal.  Nose: Nose normal.  Eyes: Right eye exhibits  no discharge. Left eye exhibits no discharge.  Cardiovascular: Normal heart sounds. An irregular rhythm present. Tachycardia present.  HR low 100s  Pulmonary/Chest: Effort normal. No accessory muscle usage. Tachypnea noted. No respiratory distress. She has decreased breath sounds in the right lower field and the left lower field. She has rales in the right lower field and the left lower field.  Abdominal: Soft. There is no tenderness.  Musculoskeletal:       Right lower leg: She exhibits edema (mild).       Left lower leg: She exhibits edema (mild).  Erythema over anterior right lower leg. Irregularly shaped. Not tender, fluctuant or warm  Neurological: She is alert.  Skin: Skin is warm and dry.  Psychiatric: Her mood appears not anxious.  Nursing note and vitals reviewed.    ED Treatments / Results  Labs (all labs ordered are listed, but only abnormal results are displayed) Labs Reviewed  BASIC METABOLIC PANEL - Abnormal; Notable for the following components:      Result Value   Potassium 3.0 (*)    Glucose, Bld 156 (*)    Calcium 8.7 (*)    GFR calc non Af Amer 56 (*)    All other components within normal limits  BRAIN NATRIURETIC PEPTIDE - Abnormal; Notable for the following components:   B Natriuretic Peptide 356.8 (*)    All other components within normal limits  CBC WITH DIFFERENTIAL/PLATELET - Abnormal; Notable for the following components:   Neutro Abs 8.3 (*)    All other components within normal limits  PROTIME-INR - Abnormal; Notable for the following components:   Prothrombin Time 23.1 (*)     All other components within normal limits  CBG MONITORING, ED - Abnormal; Notable for the following components:   Glucose-Capillary 153 (*)    All other components within normal limits  TROPONIN I  TSH  TROPONIN I  TROPONIN I  TROPONIN I  HEMOGLOBIN A1C    EKG EKG Interpretation  Date/Time:  Saturday August 09 2018 10:06:54 EST Ventricular Rate:  104 PR Interval:    QRS Duration: 76 QT Interval:  405 QTC Calculation: 533 R Axis:   6 Text Interpretation:  Atrial fibrillation Paired ventricular premature complexes Anterior infarct, old Borderline repolarization abnormality Prolonged QT interval similar to 2015 Confirmed by Sherwood Gambler [phone]) on 08/09/2018 10:16:17 AM   Radiology Dg Chest 2 View  Result Date: 08/09/2018 CLINICAL DATA:  Shortness of breath and dyspnea. EXAM: CHEST - 2 VIEW COMPARISON:  06/06/2018 FINDINGS: New densities at the lung bases, particularly the left lung base. Findings are compatible with a small amount of pleural fluid and atelectasis. Prominent interstitial lung markings with some peripheral linear densities. Findings may represent interstitial edema. Heart size is within normal limits and stable. Atherosclerotic calcifications at the aortic arch. No acute bone abnormality. Stable linear density in the medial right lower chest is compatible with scarring. IMPRESSION: Small pleural effusions with basilar atelectasis and suspect mild interstitial pulmonary edema. Electronically Signed   By: Markus Daft M.D.   On: 08/09/2018 11:50    Procedures Procedures (including critical care time)  Medications Ordered in ED Medications  atorvastatin (LIPITOR) tablet 40 mg (has no administration in time range)  benazepril (LOTENSIN) tablet 20 mg (has no administration in time range)  isosorbide mononitrate (IMDUR) 24 hr tablet 60 mg (has no administration in time range)  warfarin (COUMADIN) tablet 4 mg (has no administration in time range)  sodium chloride flush  (NS) 0.9 %  injection 3 mL (3 mLs Intravenous Not Given 08/09/18 1631)  acetaminophen (TYLENOL) tablet 650 mg (has no administration in time range)    Or  acetaminophen (TYLENOL) suppository 650 mg (has no administration in time range)  insulin aspart (novoLOG) injection 0-9 Units (0 Units Subcutaneous Not Given 08/09/18 1630)  insulin aspart (novoLOG) injection 0-5 Units (has no administration in time range)  potassium chloride SA (K-DUR,KLOR-CON) CR tablet 40 mEq (has no administration in time range)  Warfarin - Physician Dosing Inpatient (has no administration in time range)  carvedilol (COREG) tablet 25 mg (has no administration in time range)  aspirin chewable tablet 324 mg (324 mg Oral Given 08/09/18 1043)  nitroGLYCERIN (NITROSTAT) SL tablet 0.4 mg (0.4 mg Sublingual Given 08/09/18 1046)  potassium chloride SA (K-DUR,KLOR-CON) CR tablet 40 mEq (40 mEq Oral Given 08/09/18 1134)  furosemide (LASIX) injection 40 mg (40 mg Intravenous Given 08/09/18 1317)     Initial Impression / Assessment and Plan / ED Course  I have reviewed the triage vital signs and the nursing notes.  Pertinent labs & imaging results that were available during my care of the patient were reviewed by me and considered in my medical decision making (see chart for details).     Patient's symptoms are likely a combination of progressive uncontrolled hypertension and CHF.  She is not in distress and appears somewhat comfortable now that she is on oxygen.  Airway is stable.  She will be given IV diuretics and her potassium will be repleted.  Blood pressure is also somewhat improving after nitroglycerin.  The chest discomfort seems to be better.  She will be admitted to the internal medicine teaching service for further treatment and care.  Final Clinical Impressions(s) / ED Diagnoses   Final diagnoses:  Acute congestive heart failure, unspecified heart failure type [facility])    ED Discharge Orders    None       Sherwood Gambler, MD 08/09/18 1640

## 2018-08-10 DIAGNOSIS — E119 Type 2 diabetes mellitus without complications: Secondary | ICD-10-CM

## 2018-08-10 DIAGNOSIS — I160 Hypertensive urgency: Secondary | ICD-10-CM

## 2018-08-10 DIAGNOSIS — M109 Gout, unspecified: Secondary | ICD-10-CM

## 2018-08-10 DIAGNOSIS — J810 Acute pulmonary edema: Secondary | ICD-10-CM

## 2018-08-10 DIAGNOSIS — L304 Erythema intertrigo: Secondary | ICD-10-CM

## 2018-08-10 DIAGNOSIS — I110 Hypertensive heart disease with heart failure: Principal | ICD-10-CM

## 2018-08-10 DIAGNOSIS — I4581 Long QT syndrome: Secondary | ICD-10-CM

## 2018-08-10 DIAGNOSIS — I4891 Unspecified atrial fibrillation: Secondary | ICD-10-CM

## 2018-08-10 DIAGNOSIS — Z79899 Other long term (current) drug therapy: Secondary | ICD-10-CM

## 2018-08-10 DIAGNOSIS — F423 Hoarding disorder: Secondary | ICD-10-CM

## 2018-08-10 DIAGNOSIS — I2510 Atherosclerotic heart disease of native coronary artery without angina pectoris: Secondary | ICD-10-CM

## 2018-08-10 DIAGNOSIS — I5033 Acute on chronic diastolic (congestive) heart failure: Secondary | ICD-10-CM

## 2018-08-10 DIAGNOSIS — I2729 Other secondary pulmonary hypertension: Secondary | ICD-10-CM

## 2018-08-10 DIAGNOSIS — Z7901 Long term (current) use of anticoagulants: Secondary | ICD-10-CM

## 2018-08-10 LAB — GLUCOSE, CAPILLARY
Glucose-Capillary: 134 mg/dL — ABNORMAL HIGH (ref 70–99)
Glucose-Capillary: 151 mg/dL — ABNORMAL HIGH (ref 70–99)
Glucose-Capillary: 102 mg/dL — ABNORMAL HIGH (ref 70–99)
Glucose-Capillary: 153 mg/dL — ABNORMAL HIGH (ref 70–99)

## 2018-08-10 LAB — BASIC METABOLIC PANEL
ANION GAP: 18 — AB (ref 5–15)
BUN: 16 mg/dL (ref 8–23)
CHLORIDE: 104 mmol/L (ref 98–111)
CO2: 20 mmol/L — ABNORMAL LOW (ref 22–32)
CREATININE: 0.92 mg/dL (ref 0.44–1.00)
Calcium: 8.2 mg/dL — ABNORMAL LOW (ref 8.9–10.3)
GFR calc non Af Amer: 57 mL/min — ABNORMAL LOW (ref >60)
Glucose, Bld: 141 mg/dL — ABNORMAL HIGH (ref 70–99)
POTASSIUM: 4 mmol/L (ref 3.5–5.1)
Sodium: 142 mmol/L (ref 135–145)

## 2018-08-10 LAB — TROPONIN I: Troponin I: <0.03 ng/mL (ref <0.03)

## 2018-08-10 LAB — PROTIME-INR
INR: 2.99
PROTHROMBIN TIME: 30.6 s — AB (ref 11.4–15.2)

## 2018-08-10 LAB — MAGNESIUM: MAGNESIUM: 1.2 mg/dL — AB (ref 1.7–2.4)

## 2018-08-10 MED ORDER — MAGNESIUM SULFATE 4 GM/100ML IV SOLN
4.0000 g | Freq: Once | INTRAVENOUS | Status: AC
  Administered 2018-08-10: 4 g via INTRAVENOUS
  Filled 2018-08-10: qty 100

## 2018-08-10 MED ORDER — FUROSEMIDE 10 MG/ML IJ SOLN
20.0000 mg | Freq: Every day | INTRAMUSCULAR | Status: DC
  Administered 2018-08-10: 20 mg via INTRAVENOUS
  Filled 2018-08-10: qty 2

## 2018-08-10 MED ORDER — IPRATROPIUM-ALBUTEROL 0.5-2.5 (3) MG/3ML IN SOLN: 3.0000 mL | Freq: Four times a day (QID) | RESPIRATORY_TRACT | Status: DC

## 2018-08-10 MED ORDER — IPRATROPIUM-ALBUTEROL 0.5-2.5 (3) MG/3ML IN SOLN
3.0000 mL | Freq: Four times a day (QID) | RESPIRATORY_TRACT | Status: DC
  Administered 2018-08-10 (×2): 3 mL via RESPIRATORY_TRACT
  Filled 2018-08-10 (×2): qty 3

## 2018-08-10 MED ORDER — FUROSEMIDE 10 MG/ML IJ SOLN
40.0000 mg | Freq: Two times a day (BID) | INTRAMUSCULAR | Status: AC
  Administered 2018-08-11 (×2): 40 mg via INTRAVENOUS
  Filled 2018-08-10 (×3): qty 4

## 2018-08-10 MED ORDER — IPRATROPIUM-ALBUTEROL 0.5-2.5 (3) MG/3ML IN SOLN
3.0000 mL | Freq: Four times a day (QID) | RESPIRATORY_TRACT | Status: AC | PRN
  Administered 2018-08-10: 3 mL via RESPIRATORY_TRACT
  Filled 2018-08-10: qty 3

## 2018-08-10 MED ORDER — FUROSEMIDE 10 MG/ML IJ SOLN
20.0000 mg | Freq: Once | INTRAMUSCULAR | Status: AC
  Administered 2018-08-10: 20 mg via INTRAVENOUS
  Filled 2018-08-10: qty 2

## 2018-08-10 MED ORDER — NYSTATIN 100000 UNIT/GM EX POWD
Freq: Two times a day (BID) | CUTANEOUS | Status: DC
  Administered 2018-08-10 – 2018-08-13 (×7): via TOPICAL
  Filled 2018-08-10: qty 15

## 2018-08-10 NOTE — Progress Notes (Signed)
Pt accidentally discontinued IV when moving  Site is red and swollen  Informed MD to be aware of

## 2018-08-10 NOTE — Progress Notes (Signed)
IV team at bedside now

## 2018-08-10 NOTE — Progress Notes (Signed)
MD --Pt has severe MASD in groin and abdomen, was cleaned and dried, applied antifungal powder.  Pt asked for medicine to apply to extensive area.  Pt urine foul strong smelling.  Purewick and tubing was changed.  Please advise if you recommend Urinalysis.

## 2018-08-10 NOTE — Progress Notes (Signed)
Paged MD to inform pt received 67m of lasix, and competing order of 486mlasix  MD and pharmacy aware to adjust

## 2018-08-10 NOTE — Progress Notes (Signed)
Internal Medicine Attending  Date: 08/10/2018  Patient name: Rebekah Stewart Medical record number: [id number] Date of birth: 15-Nov-1934 Age: 83 y.o. Gender: female  I saw and evaluated the patient. I reviewed the resident's note by Dr. Sharon Seller and I agree with the resident's findings and plans as documented in her progress note.  Please see my H&P dated August 10, 2018 for the specifics of my evaluation, assessment, and plan from earlier in the day.

## 2018-08-10 NOTE — Evaluation (Signed)
Physical Therapy Evaluation Patient Details Name: Rebekah Stewart MRN: [id number] DOB: January 10, 1935 Today's Date: 08/10/2018   History of Present Illness  Pt admitted with SOB and CP. PMH: afib, CAD, CHF, DM, gout. Per chart and pt, she is a severe hoarder.   Clinical Impression  Pt admitted with above diagnosis. Prior to admission, pt lives alone, walks with quad cane and drives. Denies history of recent falls. Received on 1L O2 with SpO2 92%, decreased to 89% SpO2 on RA. Returned to 1L O2 for ambulation, pt subsequently decreased to 85% SpO2, increased to 2L O2 during mobility and pt maintained 90% SpO2 with instructions for pursed lip breathing. Ambulating 60 feet with supervision. Pt will benefit from skilled PT to increase their independence and safety with mobility to allow discharge to the venue listed below.       Follow Up Recommendations Home health PT;Supervision - Intermittent (pt likely to decline HHPT unless she goes to son's home)    Equipment Recommendations  Other (comment)(Rollator)    Recommendations for Other Services       Precautions / Restrictions Precautions Precautions: Fall Precaution Comments: denies recent falls Restrictions Weight Bearing Restrictions: No      Mobility  Bed Mobility Overal bed mobility: Modified Independent                Transfers Overall transfer level: Needs assistance Equipment used: Quad cane Transfers: Sit to/from Stand Sit to Stand: Supervision         General transfer comment: for safety and 02 line management  Ambulation/Gait Ambulation/Gait assistance: Supervision Gait Distance (Feet): 60 Feet Assistive device: Quad cane Gait Pattern/deviations: Step-through pattern;Trunk flexed;Decreased stride length Gait velocity: decreased Gait velocity interpretation: <1.8 ft/sec, indicate of risk for recurrent falls General Gait Details: Slow speed but no overt LOB noted  Stairs            Wheelchair Mobility    Modified Rankin (Stroke Patients Only)       Balance Overall balance assessment: Needs assistance   Sitting balance-Leahy Scale: Good       Standing balance-Leahy Scale: Fair                               Pertinent Vitals/Pain Pain Assessment: No/denies pain    Home Living Family/patient expects to be discharged to:: Private residence Living Arrangements: Alone Available Help at Discharge: Family;Available PRN/intermittently Type of Home: House Home Access: Stairs to enter Entrance Stairs-Rails: None Entrance Stairs-Number of Steps: 2 Home Layout: One level Home Equipment: Cane - quad;Shower seat;Bedside commode Additional Comments: per pt, she has clear pathways to walk around her home, she drives, volunteers during elections    Prior Function Level of Independence: Independent with assistive device(s)         Comments: walks with a quad cane     Hand Dominance   Dominant Hand: Right    Extremity/Trunk Assessment   Upper Extremity Assessment Upper Extremity Assessment: Overall WFL for tasks assessed    Lower Extremity Assessment Lower Extremity Assessment: Overall WFL for tasks assessed    Cervical / Trunk Assessment Cervical / Trunk Assessment: Kyphotic  Communication   Communication: No difficulties  Cognition Arousal/Alertness: Awake/alert Behavior During Therapy: WFL for tasks assessed/performed Overall Cognitive Status: Within Functional Limits for tasks assessed  General Comments      Exercises     Assessment/Plan    PT Assessment Patient needs continued PT services  PT Problem List Decreased activity tolerance;Decreased balance;Decreased mobility;Cardiopulmonary status limiting activity       PT Treatment Interventions DME instruction;Gait training;Stair training;Functional mobility training;Therapeutic activities;Therapeutic exercise;Balance  training;Patient/family education    PT Goals (Current goals can be found in the Care Plan section)  Acute Rehab PT Goals Patient Stated Goal: to breathe better PT Goal Formulation: With patient Time For Goal Achievement: 08/24/18 Potential to Achieve Goals: Good    Frequency Min 3X/week   Barriers to discharge Inaccessible home environment pt is a hoarder    Co-evaluation               AM-PAC PT "6 Clicks" Daily Activity  Outcome Measure Difficulty turning over in bed (including adjusting bedclothes, sheets and blankets)?: None Difficulty moving from lying on back to sitting on the side of the bed? : None Difficulty sitting down on and standing up from a chair with arms (e.g., wheelchair, bedside commode, etc,.)?: A Little Help needed moving to and from a bed to chair (including a wheelchair)?: A Little Help needed walking in hospital room?: A Little Help needed climbing 3-5 steps with a railing? : A Little 6 Click Score: 20    End of Session Equipment Utilized During Treatment: Oxygen Activity Tolerance: Patient tolerated treatment well Patient left: in chair;with call bell/phone within reach;with family/visitor present Nurse Communication: Mobility status PT Visit Diagnosis: Difficulty in walking, not elsewhere classified (R26.2)    Time: 7858-8502 PT Time Calculation (min) (ACUTE ONLY): 22 min   Charges:   PT Evaluation $PT Eval Moderate Complexity: [place], PT, DPT Acute Rehabilitation Services Pager [phone] Office [phone]   Willy Eddy 08/10/2018, 3:20 PM

## 2018-08-10 NOTE — Progress Notes (Signed)
   Subjective:  She states she continues to have shortness of breath that is slightly improved from yesterday. She was on RA yesterday but was placed back on [employer] for increased SOB overnight. She has not had any recurrence of chest pain but does feel tight. She is urinating and eating well.  On further discussing her SOB at home, she has difficulty sleeping at night and wakes up frequently. She also has fatigue in the mornings and does not feel like she has gotten a full night's rest.    Objective:  Vital signs in last 24 hours: Vitals:   08/10/18 0615 08/10/18 0835 08/10/18 0836 08/10/18 1053  BP: (!) 146/77  (!) 176/97   Pulse: 77  92   Resp: (!) 22     Temp: 97.7 F (36.5 C)     TempSrc: Oral     SpO2: 95% (!) 88% 95% 96%  Weight: 92.4 kg     Height:       Physical Exam:  Constitution: NAD, lying supine in bed HENT:  in place  Cardio: irregular rate & rhythm, no m/r/g Respiratory: bilateral wheezing, left lower lobe crackles  GU: pure wick in place Neuro: a&o, cooperative, normal affect Skin: +1 pitting edema, venous stasis skin changes    Assessment/Plan:  Principal Problem:   Flash pulmonary edema (HCC) Active Problems:   Atrial fibrillation (HCC)   HTN (hypertension)   Chronic diastolic heart failure (HCC)   Intertrigo   Hypertensive urgency   Pulmonary hypertension assoc with unclear multi-factorial mechanisms (HCC)   Acute on chronic diastolic heart failure ([place])  83 yo female w PMH a.fib, CAD w/cardiac cath 2010, HFpEF, HTN, TIIDM, and gout presenting [facility] ED with SOB that has been worsening for the past month.  Acute on Chronic Diastolic Heart Failure  Flash Pulmonary Edema 2/2 Hypertension Atrial Fibrillation  Continued but slightly improved SOB with crackles on exam this morning and new wheezing. Continues to have signs of hypervolemia and will need further diuresis. She also has a history consistent with possible OSA that has not been worked up in the  past. ECHO showed possible pulmonary HTN which was also seen in her echo from 2018. This could be secondary to sleep apnea or possible chronic pulmonary emboli secondary to her atrial fibrillation. Finally, she has a history of symptomatic hypotension with spironolactone use with hypokalemia at admission. This is likely secondary to her increased lasix dosage, but we will obtain an am aldosterone + renin. She did not receive her evening dose of benazepril last night and has remained hypertensive today although less than yesterday. She will get her medications tonight and we will reassess BP in the am.     - 40 mg lasix IV qd - duo-neb now and then q6h prn  - Mg 1.2, 4g Mg given - am CMP, Mg - outpatient sleep study referral  - consider outpatient VQ scan - cont. Coumadin, coreg 25 mg bid, benazepril 20 mg qd - am aldosterone + renin activity  Intertrigo  Across lower abdominal folds and inner thigh creases  - nystatin powder - avoid using her kenalog cream here - keep area dry    VTE: warfarin IVF: none Diet: heart healthy/carb modified Code: full   Dispo: Anticipated discharge in approximately 2-3 days.   Seawell, Jaimie A, DO 08/10/2018, 1:35 PM Pager: [phone]

## 2018-08-10 NOTE — Progress Notes (Signed)
Neb treatment given per patient's request.

## 2018-08-10 NOTE — Evaluation (Signed)
Occupational Therapy Evaluation Patient Details Name: Rebekah Stewart MRN: [id number] DOB: 05-04-1935 Today's Date: 08/10/2018    History of Present Illness Pt admitted with SOB and CP. PMH: afib, CAD, CHF, DM, gout. Per chart and pt, she is a severe hoarder.    Clinical Impression   Pt lives alone, walks with a quad cane and drives. She presents with decreased activity tolerance, SOB with Sp02 of 88% on RA (replaced 2L). She reports her MD has recommended compression hose, but she is not able to don. Pt eats out a lot, unclear if her kitchen is accessible due to her hoarding. Pt may go to either her niece's or her son's home at discharge.She is requesting an "upright walker" to help her stand straighter, will defer to PT. Will follow acutely.    Follow Up Recommendations  No OT follow up(pt likely to decline HHOT unless she goes to her son's home)    Equipment Recommendations  None recommended by OT    Recommendations for Other Services       Precautions / Restrictions Precautions Precautions: Fall Precaution Comments: denies recent falls Restrictions Weight Bearing Restrictions: No      Mobility Bed Mobility Overal bed mobility: Modified Independent                Transfers Overall transfer level: Needs assistance Equipment used: Quad cane Transfers: Sit to/from Stand Sit to Stand: Supervision         General transfer comment: for safety and 02 line management    Balance Overall balance assessment: Needs assistance   Sitting balance-Leahy Scale: Good       Standing balance-Leahy Scale: Fair                             ADL either performed or assessed with clinical judgement   ADL Overall ADL's : Needs assistance/impaired Eating/Feeding: Independent;Sitting   Grooming: Supervision/safety;Sitting   Upper Body Bathing: Set up;Sitting   Lower Body Bathing: Supervison/ safety;Sit to/from stand   Upper Body Dressing : Set up;Sitting    Lower Body Dressing: Supervision/safety;Sit to/from stand Lower Body Dressing Details (indicate cue type and reason): pt reports she is supposed to wear compression socks, but cannot manage them Toilet Transfer: Supervision/safety;Ambulation;BSC   Toileting- Water quality scientist and Hygiene: Supervision/safety;Sit to/from stand       Functional mobility during ADLs: Supervision/safety;Cane(assist to manage 02 line) General ADL Comments: pt with dyspnea with minimal exertion     Vision Baseline Vision/History: Wears glasses Wears Glasses: At all times Patient Visual Report: No change from baseline       Perception     Praxis      Pertinent Vitals/Pain Pain Assessment: No/denies pain     Hand Dominance Right   Extremity/Trunk Assessment Upper Extremity Assessment Upper Extremity Assessment: Overall WFL for tasks assessed   Lower Extremity Assessment Lower Extremity Assessment: Defer to PT evaluation   Cervical / Trunk Assessment Cervical / Trunk Assessment: Kyphotic   Communication Communication Communication: No difficulties   Cognition Arousal/Alertness: Awake/alert Behavior During Therapy: WFL for tasks assessed/performed Overall Cognitive Status: Within Functional Limits for tasks assessed                                     General Comments       Exercises     Shoulder Instructions  Home Living Family/patient expects to be discharged to:: Private residence Living Arrangements: Alone Available Help at Discharge: Family;Available PRN/intermittently Type of Home: House Home Access: Stairs to enter [employer] of Steps: 2 Entrance Stairs-Rails: None Home Layout: One level     Bathroom Shower/Tub: Occupational psychologist: Standard     Home Equipment: Cane - quad;Shower seat;Bedside commode   Additional Comments: per pt, she has clear pathways to walk around her home, she drives, volunteers during  elections      Prior Functioning/Environment Level of Independence: Independent with assistive device(s)        Comments: walks with a quad cane        OT Problem List: Impaired balance (sitting and/or standing);Cardiopulmonary status limiting activity;Decreased knowledge of use of DME or AE      OT Treatment/Interventions: Self-care/ADL training;Energy conservation;Patient/family education;DME and/or AE instruction    OT Goals(Current goals can be found in the care plan section) Acute Rehab OT Goals Patient Stated Goal: to breathe better OT Goal Formulation: With patient Time For Goal Achievement: 08/24/18 Potential to Achieve Goals: Good ADL Goals Pt Will Perform Grooming: with modified independence;standing Pt Will Perform Lower Body Dressing: with modified independence;sit to/from stand Pt Will Transfer to Toilet: with modified independence;ambulating;bedside commode(over toilet) Pt Will Perform Toileting - Clothing Manipulation and hygiene: with modified independence;sit to/from stand Additional ADL Goal #1: Pt will state at least 3 energy conservation strategies in ADL independently. Additional ADL Goal #2: Pt will gather items necessary for ADL around her room modified independently, managing 02 line as needed.  OT Frequency: Min 2X/week   Barriers to D/C: Decreased caregiver support;Inaccessible home environment          Co-evaluation              AM-PAC PT "6 Clicks" Daily Activity     Outcome Measure Help from another person eating meals?: None Help from another person taking care of personal grooming?: A Little Help from another person toileting, which includes using toliet, bedpan, or urinal?: A Little Help from another person bathing (including washing, rinsing, drying)?: A Little Help from another person to put on and taking off regular upper body clothing?: None Help from another person to put on and taking off regular lower body clothing?: A  Little 6 Click Score: 20   End of Session Equipment Utilized During Treatment: Gait belt;Oxygen(2L) Nurse Communication: Mobility status  Activity Tolerance: Patient limited by fatigue Patient left: in chair;with call bell/phone within reach;with chair alarm set  OT Visit Diagnosis: Unsteadiness on feet (R26.81);Other abnormalities of gait and mobility (R26.89);Other (comment)(decreased activity tolerance)                Time: 0786-7544 OT Time Calculation (min): 25 min Charges:  OT General Charges $OT Visit: 1 Visit OT Evaluation $OT Eval Moderate Complexity: 1 Mod OT Treatments $Self Care/Home Management : 8-22 mins  Nestor Lewandowsky, OTR/L Acute Rehabilitation Services Pager: [phone] Office: [phone]  Malka So 08/10/2018, 10:58 AM

## 2018-08-10 NOTE — H&P (Signed)
Internal Medicine Attending Admission Note Date: 08/10/2018  Patient name: Rebekah Stewart Medical record number: [id number] Date of birth: 02/28/1935 Age: 83 y.o. Gender: female  I saw and evaluated the patient. I reviewed the resident's note and I agree with the resident's findings and plan as documented in the resident's note.  Chief Complaint(s): Progressive shortness of breath x1 month with a rapid worsening over the last 3 days.  History - key components related to admission:  Rebekah Stewart is a 83 year old woman with a history of coronary artery disease, chronic diastolic heart failure, atrial fibrillation, essential hypertension, type 2 diabetes, gout, and hoarding disorder who presents with a one-month history of progressive dyspnea on exertion with rapid worsening over the last 3 days.  She notes a recent history of chest pressure and tightness that are intermittent and worse with bending over.  She was recently seen by her cardiologist and underwent a nuclear stress test which was low risk for ischemia.  That said, her dyspnea has continued to progress and she now has to stop after walking only a few feet.  She has also noted increasing lower extremity swelling and had self titrated up her Lasix from 20 mg daily to 40 mg daily recently.  This has resulted in an increase in diuresis.  With this increased dyspnea she presented to the emergency department.  In the emergency department she was noted to have a blood pressure of 230/110.  Her chest x-ray was consistent with pulmonary edema.  The concern was that she had a hypertensive urgency with acute heart failure.  She was admitted to the internal medicine teaching service for further evaluation and care.  When seen on rounds the morning after admission she noted minimal improvement in her dyspnea.  When asked about her medication she states that she generally has been compliant.  If she takes her morning medications late she will set her clock to wake  up 12 hours later so she could take her evening medications appropriately spaced.  She states that when she awakens she will occasionally have morning headaches and rarely feel like she had a good night sleep.  That said, she admits to only sleeping a few hours, then waking up for a few hours, and then sleeping for a few more hours.  She denies depression and this pattern of sleeping would not be consistent with obstructive sleep apnea associated daytime somnolence although she could still have OSA.  With regards to her chest pain it is more of a tightness and she denies a pleuritic component.  She was without other acute complaints.  Physical Exam - key components related to admission:  Vitals:   08/10/18 0615 08/10/18 0835 08/10/18 0836 08/10/18 1053  BP: (!) 146/77  (!) 176/97   Pulse: 77  92   Resp: (!) 22     Temp: 97.7 F (36.5 C)     TempSrc: Oral     SpO2: 95% (!) 88% 95% 96%  Weight: 92.4 kg     Height:       General: Well-developed, well-nourished, obese woman lying comfortably in bed with the head elevated at approximately 25 degrees.  She is on room air but becomes dyspneic just with speaking. Lungs: Bibasilar inspiratory crackles with expiratory wheezing and end expiratory squeaks. Heart: Distant heart sounds, irregularly irregular without appreciated murmurs, rubs, or gallops. Extremities: 1+ pitting edema bilaterally with an erythematous dermatitis of the left ankle anteriorly and laterally. Skin: Erythema and maceration under the abdominal pannus.  Lab results:  Basic Metabolic Panel: Recent Labs    08/09/18 1019 08/10/18 0657  NA 143 142  K 3.0* 4.0  CL 102 104  CO2 28 20*  GLUCOSE 156* 141*  BUN 12 16  CREATININE 0.93 0.92  CALCIUM 8.7* 8.2*  MG  --  1.2*   CBC: Recent Labs    08/09/18 1019  WBC 10.2  NEUTROABS 8.3*  HGB 14.2  HCT 46.0  MCV 96.2  PLT 332   Cardiac Enzymes: Recent Labs    08/09/18 1633 08/10/18 0324 08/10/18 0657  TROPONINI  <0.03 <0.03 <0.03   CBG: Recent Labs    08/09/18 1013 08/09/18 1625 08/09/18 2135 08/10/18 0747  GLUCAP 153* 103* 133* 134*   Hemoglobin A1C: Recent Labs    08/09/18 1633  HGBA1C 6.5*   Thyroid Function Tests: Recent Labs    08/09/18 1019  TSH 2.312   Coagulation: Recent Labs    08/09/18 1019 08/10/18 0657  INR 2.08 2.99   Misc. Labs:  BNP 356.8  Imaging results:   AP portable and lateral chest x-ray: Personally reviewed.  Small left pleural effusion with atelectasis at the left base, interstitial prominence with right-sided Kerly B-lines.  The interstitial prominence and left effusion are new compared to the previous chest x-ray on June 06, 2018.  Other results:  EKG: Personally reviewed.  Atrial fibrillation at 104 bpm, occasional PVCs, normal axis, prolonged QTC at 533 ms, no significant Q waves, no LVH by voltage, deep Q waves in V1, voltage transition between V3 and V4, no ST segment changes, but diffuse T wave flattening.  There is no significant changes from the ECG on July 01, 2018.  Assessment & Plan by Problem:  Rebekah Stewart is a 83 year old woman with a history of coronary artery disease, chronic diastolic heart failure, atrial fibrillation, essential hypertension, type 2 diabetes, gout, and hoarding disorder who presents with a one-month history of progressive dyspnea on exertion with rapid worsening over the last 3 days.  My suspicion is that she has chronic worsening of her chronic diastolic heart failure with an increased diuretic requirement.  Over the last 3 days her symptoms accelerated likely secondary to a hypertensive urgency resulting in pulmonary edema.  She has now had 2 echocardiograms suggesting pulmonary artery hypertension.  This may be secondary to chronic diastolic heart failure but could also be related to obstructive sleep apnea given her daytime somnolence and body habitus or suboptimal hypertensive control.  Anyone with pulmonary  hypertension must have chronic thromboembolic disease within the differential diagnosis and this will need to be assessed.  My suspicion for this is low in her given she is on chronic anticoagulation for her atrial fibrillation and seems to be within therapeutic target in the vast majority of time.  1) Acute on chronic diastolic heart failure: We will aggressively diurese Ms. Warriner in the acute care setting.  Although echocardiography suggests that her central venous pressure is 8 mmHg, clinically and radiographically it is much higher.  In addition, I suspect she will require at least 40 mg of Lasix orally as an outpatient as her diuretic needs seems to have increased.  Ultimately, control of her chronic diastolic heart failure will require that we manage all of those other chronic medical conditions that resulted in an insult to the heart leading to diastolic heart failure.  She will require a formal nocturnal polysomnogram as an outpatient to assess for obstructive sleep apnea.  Prior to discharge we could consider getting a  VQ scan to rule out chronic pulmonary emboli, although this too can be done as an outpatient.  Control of her atrial fibrillation is reasonable and if she has never had a go at cardioversion this could be considered.  Her diabetes is well controlled and weight loss would be ideal for her diastolic heart failure although unlikely to be achieved.  Finally, better control of her essential hypertension would also be helpful in managing her chronic diastolic heart failure and avoiding acute exacerbations as occurred in this case.  2) Essential hypertension: Per her history Spironolactone was discontinued in the past secondary to hypotension.  Ironically, this suggests that she may have hyperaldosteronism as the blood pressure seem to meltaway with this therapy.  We will check a renin plasma activity level and aldosterone level to assess for this possibility.  Given the hypertension upon  presentation, if she has primary hyperaldosteronism another go at Spironolactone and discontinuance of other blood pressure medications may be beneficial.  In the meantime, we will consider escalation of her current regimen and start with increasing her benazepril dose.  3) Hoarding disorder: Unfortunately, the best therapy for hoarding disorder at this time seems to be cognitive behavioral therapy.  This would be treatment she would have to actively participate in.  SSRI medications have been poorly studied in this particular disorder and at this point cannot be recommended.  4) Disposition: I suspect her symptoms will improve with diuresis and control of her afterload.  It appears her son is concerned about her current living conditions and social work has been consulted. Occupational Therapy feels no outpatient OT is required.  We are awaiting physical therapy's assessment as to what would be the best place for her to transition from the inpatient service.

## 2018-08-10 NOTE — Progress Notes (Signed)
Pt slept most of night. She stated early in shift that she had not slept and needed to sleep.   When Benazapril arrived, pt was asleep and not woken.    MD-pt normally takes Benazapril HS.

## 2018-08-10 NOTE — Progress Notes (Signed)
Called RT for a breathing treatment  Update provided to pt niece at bedside

## 2018-08-11 DIAGNOSIS — I878 Other specified disorders of veins: Secondary | ICD-10-CM

## 2018-08-11 DIAGNOSIS — J90 Pleural effusion, not elsewhere classified: Secondary | ICD-10-CM

## 2018-08-11 DIAGNOSIS — I2729 Other secondary pulmonary hypertension: Secondary | ICD-10-CM

## 2018-08-11 LAB — COMPREHENSIVE METABOLIC PANEL
ALBUMIN: 2.9 g/dL — AB (ref 3.5–5.0)
ALT: 13 U/L (ref 0–44)
ANION GAP: 8 (ref 5–15)
AST: 16 U/L (ref 15–41)
Alkaline Phosphatase: 48 U/L (ref 38–126)
BUN: 13 mg/dL (ref 8–23)
CO2: 34 mmol/L — AB (ref 22–32)
Calcium: 8.3 mg/dL — ABNORMAL LOW (ref 8.9–10.3)
Chloride: 100 mmol/L (ref 98–111)
Creatinine, Ser: 0.97 mg/dL (ref 0.44–1.00)
GFR calc Af Amer: >60 mL/min (ref >60)
GFR calc non Af Amer: 53 mL/min — ABNORMAL LOW (ref >60)
GLUCOSE: 140 mg/dL — AB (ref 70–99)
POTASSIUM: 3 mmol/L — AB (ref 3.5–5.1)
SODIUM: 142 mmol/L (ref 135–145)
Total Bilirubin: 1.1 mg/dL (ref 0.3–1.2)
Total Protein: 6.2 g/dL — ABNORMAL LOW (ref 6.5–8.1)

## 2018-08-11 LAB — GLUCOSE, CAPILLARY
GLUCOSE-CAPILLARY: 159 mg/dL — AB (ref 70–99)
GLUCOSE-CAPILLARY: 164 mg/dL — AB (ref 70–99)
Glucose-Capillary: 123 mg/dL — ABNORMAL HIGH (ref 70–99)
Glucose-Capillary: 104 mg/dL — ABNORMAL HIGH (ref 70–99)

## 2018-08-11 LAB — PROTIME-INR
INR: 1.99
PROTHROMBIN TIME: 22.3 s — AB (ref 11.4–15.2)

## 2018-08-11 LAB — MAGNESIUM: Magnesium: 1.9 mg/dL (ref 1.7–2.4)

## 2018-08-11 MED ORDER — POTASSIUM CHLORIDE CRYS ER 20 MEQ PO TBCR
40.0000 meq | EXTENDED_RELEASE_TABLET | Freq: Two times a day (BID) | ORAL | Status: AC
  Administered 2018-08-11 (×2): 40 meq via ORAL
  Filled 2018-08-11 (×2): qty 2

## 2018-08-11 MED ORDER — IPRATROPIUM-ALBUTEROL 0.5-2.5 (3) MG/3ML IN SOLN: 3.0000 mL | Freq: Four times a day (QID) | RESPIRATORY_TRACT | Status: DC | PRN

## 2018-08-11 NOTE — Progress Notes (Addendum)
Cardiac order expired, paged Dr. Sharon Seller  and informed. She will place order for patient to stay on [place], RN

## 2018-08-11 NOTE — Progress Notes (Signed)
   Subjective:  SOB improved today, she ambulated yesterday down the hall with physical therapy. Cont. To use supplemental O2 today. States duo-neb treatment helped and is loosening phlegm. She is agreeable to doing outpatient PT after discharge.   Objective:  Vital signs in last 24 hours: Vitals:   08/10/18 2154 08/11/18 0123 08/11/18 0559 08/11/18 0603  BP: 124/67  (!) 159/90   Pulse: 72 74 79   Resp: 20  20   Temp: 97.6 F (36.4 C)  97.6 F (36.4 C)   TempSrc: Oral  Oral   SpO2: 95%  98%   Weight:    92 kg  Height:       Physical Exam:   Constitution: NAD, lying supine in bed HENT: Amaya in place Cardio: irregular rate & rhythm, no m/r/g Respiratory: Left lower lobe crackles, no wheezing or rhonchi Abdominal: NTTP, non-distended Neuro: a&o, pleasant Skin: +1 pitting edema, chronic venous stasis LE b/l   Assessment/Plan:  Principal Problem:   Acute on chronic diastolic heart failure (HCC) Active Problems:   Atrial fibrillation (HCC)   HTN (hypertension)   Flash pulmonary edema (HCC)   Intertrigo   Hypertensive urgency   Pulmonary hypertension assoc with unclear multi-factorial mechanisms (HCC)  Pulmonary Hypertension Acute on Chronic Diastolic Heart Failure A. Fib SOB improving today. She was able to ambulate down the hall with PT yesterday and is improving on PE. We discussed that her refractory hypertension and symptoms could be related to her HFpEF and atrial fibrillation. I believe she would benefit from possible rhythm control, and she is interested in outpatient referral to the a. Fib clinic. She also has episodes of waking frequently at night and having daytime somnolence. We will recommend outpt sleep study. Baseline weight is 205 lbs.   - Cont. Lasix 40 mg bid - daily weights, strict I/O's  - cont. PT/OT  - wean supplemental O2 for >92% - duo-neb q6h prn - repleting potassium  - am bmp, mg - outpatient sleep study referral - continuous pulse ox this  evening  - atrial fibrillation clinic  - aldosterone + renin pending  - cont. Coreg, benazapril, imdur   Intertrigo Cont. Nystatin powder, keep dry. Do not apply kenelog cream   VTE: coumadin IVF: none Diet: heart/carb modified Code: Full   Dispo: Anticipated discharge in approximately 1-2 days.   Marty Heck, DO 08/11/2018, 6:45 AM Pager: [phone]

## 2018-08-11 NOTE — Progress Notes (Signed)
Tried to wean pt to RA.  Tried to decreased oxygen to 1 L at first, oxygen dropped down to 88% at rest. Placed patient back to 2 L.

## 2018-08-11 NOTE — Progress Notes (Signed)
Internal Medicine Attending:   I saw and examined the patient. I reviewed the resident's note and I agree with the resident's findings and plan as documented in the resident's note.  Principal Problem:   Acute on chronic diastolic heart failure (HCC) Active Problems:   Atrial fibrillation (HCC)   HTN (hypertension)   Flash pulmonary edema (HCC)   Intertrigo   Pulmonary hypertension assoc with unclear multi-factorial mechanisms [facility])  Hospital day #2 with acute on chronic heart failure with preserved ejection fraction complicated by atrial fibrillation.  Patient is doing well with minimal symptoms today.  Still appears modestly volume overloaded, mild pulmonary edema and a small right-sided pleural effusion on exam today.  Still has mild lower extremity pitting edema.  Urine output has been good over the last 24 hours with IV Lasix diuresis and we will continue this again today.  Will arrange for home health physical therapy when she is ready for discharge, which should be in approximately 1-2 days.  Lalla Brothers, MD

## 2018-08-12 ENCOUNTER — Ambulatory Visit: Payer: Medicare Other | Admitting: Cardiovascular Disease

## 2018-08-12 DIAGNOSIS — R0902 Hypoxemia: Secondary | ICD-10-CM

## 2018-08-12 LAB — PROTIME-INR
INR: 1.87
Prothrombin Time: 21.3 seconds — ABNORMAL HIGH (ref 11.4–15.2)

## 2018-08-12 LAB — GLUCOSE, CAPILLARY
GLUCOSE-CAPILLARY: 121 mg/dL — AB (ref 70–99)
Glucose-Capillary: 151 mg/dL — ABNORMAL HIGH (ref 70–99)
Glucose-Capillary: 183 mg/dL — ABNORMAL HIGH (ref 70–99)
Glucose-Capillary: 122 mg/dL — ABNORMAL HIGH (ref 70–99)

## 2018-08-12 LAB — MAGNESIUM: MAGNESIUM: 1.7 mg/dL (ref 1.7–2.4)

## 2018-08-12 LAB — BASIC METABOLIC PANEL
Anion gap: 10 (ref 5–15)
BUN: 19 mg/dL (ref 8–23)
CO2: 32 mmol/L (ref 22–32)
Calcium: 8.4 mg/dL — ABNORMAL LOW (ref 8.9–10.3)
Chloride: 98 mmol/L (ref 98–111)
Creatinine, Ser: 0.99 mg/dL (ref 0.44–1.00)
GFR calc Af Amer: >60 mL/min (ref >60)
GFR, EST NON AFRICAN AMERICAN: 52 mL/min — AB (ref >60)
Glucose, Bld: 159 mg/dL — ABNORMAL HIGH (ref 70–99)
Potassium: 4 mmol/L (ref 3.5–5.1)
SODIUM: 140 mmol/L (ref 135–145)

## 2018-08-12 MED ORDER — WARFARIN - PHARMACIST DOSING INPATIENT: Freq: Every day | Status: DC

## 2018-08-12 MED ORDER — COLCHICINE 0.6 MG PO TABS
1.2000 mg | ORAL_TABLET | Freq: Once | ORAL | Status: AC
  Administered 2018-08-12: 1.2 mg via ORAL
  Filled 2018-08-12: qty 2

## 2018-08-12 MED ORDER — WARFARIN SODIUM 3 MG PO TABS
6.0000 mg | ORAL_TABLET | Freq: Once | ORAL | Status: AC
  Administered 2018-08-12: 6 mg via ORAL
  Filled 2018-08-12: qty 2

## 2018-08-12 MED ORDER — FUROSEMIDE 20 MG PO TABS
20.0000 mg | ORAL_TABLET | Freq: Every day | ORAL | Status: DC
  Administered 2018-08-12 – 2018-08-13 (×2): 20 mg via ORAL
  Filled 2018-08-12 (×2): qty 1

## 2018-08-12 MED ORDER — COLCHICINE 0.6 MG PO TABS
0.6000 mg | ORAL_TABLET | Freq: Once | ORAL | Status: AC
  Administered 2018-08-12: 0.6 mg via ORAL
  Filled 2018-08-12: qty 1

## 2018-08-12 MED ORDER — COLCHICINE 0.6 MG PO TABS
0.6000 mg | ORAL_TABLET | Freq: Once | ORAL | Status: AC
  Administered 2018-08-13: 0.6 mg via ORAL
  Filled 2018-08-12: qty 1

## 2018-08-12 NOTE — Progress Notes (Addendum)
   Subjective:  Patient is feeling better today and is sitting up in chair at bedside. Her SOB is decreased, no recent CP, and she has been walking with physical therapy.  She is accompanied by her son. She has been unable to wean off of the [place] lower than 2L including while walking and sitting and will qualify for home supplemental O2. We discussed that this may or may not be temporary. We also discussed that she will benefit from staying with her son or daughter while doing outpatient PT and learning to work with her supplemental O2.    Objective:  Vital signs in last 24 hours: Vitals:   08/11/18 1517 08/11/18 1756 08/11/18 2021 08/12/18 0428  BP:  (!) 145/71 133/70 (!) 171/102  Pulse:  86 93 96  Resp:   18 18  Temp:   98.2 F (36.8 C) 98 F (36.7 C)  TempSrc:   Oral Oral  SpO2: 95%  98% 96%  Weight:    90.4 kg  Height:       Physical Exam:  Constitution: NAD, sitting comfortably Cardio: irregular rate and rhythm  Respiratory: 2LNC, non-labored breathing MSK: LLE 1st metatarsal erythematous, non-edematous, TTP Neuro: a&o, pleasant, normal affect Skin: c/d/i, no pitting edema    Assessment/Plan:  Principal Problem:   Acute on chronic diastolic heart failure (HCC) Active Problems:   Atrial fibrillation (HCC)   HTN (hypertension)   Flash pulmonary edema (HCC)   Intertrigo   Pulmonary hypertension assoc with unclear multi-factorial mechanisms (HCC)  Pulmonary Hypertension Acute on Chronic Diastolic Heart Failure Atrial Fibrillation SOB improved, and she is euvolemic on exam and slightly below her baseline weight. She has been unable to wean supplemental O2 and qualifies for outpatient supplemental O2. With results of her ECHO showing likely pulmonary hypertension and ongoing hypoxia she will need further outpatient work up including sleep study, referral to the atrial fibrillation clinic for rhythm control of her a. Fib., and V/Q scan for chronic PE.   - transition to home  lasix 20 mg po  - cont. Home bp medications  - cont. PT/OT - am bmp, mg  - wean supplemental O2 for >92% - daily weights, strict I/O's - duo-neb q6h prn   Acute Gout Flare Patient had not yet filled her allopurinol prescription after her first gout flare in September. She began to develop pain in her left great toe overnight consistent with acute gout flare   - colchicine 1.2 mg, then .6 one hour later  - colchicine .6 mg tomorrow  VTE: coumadin per pharm IVF: none  Diet: heart healthy/carb modified  Code: Full   Dispo: Anticipated discharge in approximately .   Molli Hazard A, DO 08/12/2018, 7:56 AM Pager: [phone]

## 2018-08-12 NOTE — Consult Note (Signed)
[facility] CM Primary Care Navigator  08/12/2018  Rebekah Stewart [birth year] [id number]   Met with patient at the bedsideto identify possible discharge needs. Patientreports having "worsening shortness of breath and decreased oxygen level on the 70's" that resulted to this admission. (hypertensive urgency, acute on chronic diastolic heart failure, flash pulmonary edema, pulmonary HTN, atrial fibrillation, intertrigo, hoarding disorder).  PatientendorsesDr.Douglas [name] with [facility] in [place] as her primary care provider.   [facility] [place] [facility] so far, since provider had substituted some medications that are expensive for patient to afford.  Patientstatesthat shehas been managingher own medications at homeusing "pill box" system filled once a week.  Patient reports that she has been driving prior to admission, but her son [name]), niece [name]) and daughter [name] able to providetransportation toher doctors' appointmentsafter discharge.  Patient verbalized that her son will be the primary caregiver for her after discharge, since she will be going to his house to recover.   Anticipated discharge plan is home with home health services per therapy recommendation. Patient is to stay with her son [name]) after discharge, per Inpatient CM.  Patient voiced understanding to call primary care provider's office upon discharge for a post hospitalization follow-up appointment within1- 2 weeksor sooner if needs arise.Patient letter (with PCP's contact number) was provided asa reminder.  She admits having health conditions that needs attention (mainly HTN, HF, DM) and she is knowledgeable of ways to manage these conditions, stating "I just need commitment in doing what I am suppose to do to take care of myself". She mentioned having a weighing scale, blood pressure  machine and blood sugar device that she can get from her home to use in monitoring herself. She admits to not regularly checking/ recording weights, blood sugar and blood pressure and not adhering to diet restrictions. Importance and benefits of managing her health issues had been discussed and reviewed with her and was encouraged to do so. Patient declined any home visits for now.  Explained topatient regarding[facility] CM services available for health management and resourcesat home but would only opt and verbally agree with EMMI HF callsto follow-up with her recovery at home.  Referral was made for EMMI HFcalls after discharge.  Patient expressed understanding to seek referral to [facility] care managementfrom primary care provider as deemed necessary forfurtherservicesin the future.   [facility] care management information provided for future needs that may arise.  Primary care provider's office is listed as providing transition of care (TOC) follow-up.   For additional questions please contact:  Edwena Felty A. Clemente Dewey, BSN, RN-BC [facility] PRIMARY CARE Navigator Cell: [phone]

## 2018-08-12 NOTE — Plan of Care (Signed)

## 2018-08-12 NOTE — Clinical Social Work Note (Signed)
Clinical Social Work Assessment  Patient Details  Name: Rebekah Stewart MRN: [id number] Date of Birth: [birth year]  Date of referral:  08/12/18               Reason for consult:  Housing Concerns/Homelessness                Permission sought to share information with:  Family Supports Permission granted to share information::  Yes, Verbal Permission Granted  Name::     Cari Burgo  Agency::     Relationship::  Son  Contact Information:  [phone]  Housing/Transportation Living arrangements for the past 2 months:  [place] of Information:  Patient, Medical Team, Adult Children Patient Interpreter Needed:  None Criminal Activity/Legal Involvement Pertinent to Current Situation/Hospitalization:  No - Comment as needed Significant Relationships:  Adult Children Lives with:  Self Do you feel safe going back to the place where you live?  Yes Need for family participation in patient care:  Yes (Comment)  Care giving concerns:  Hoarding, home safety.   Social Worker assessment / plan:  CSW met with patient. Son at bedside. CSW introduced role and inquired about concerns. Patient and son confirmed hoarding and that she does not have AC/heat in the home. Patient's son stated that there are pathways through the home but not wide enough for her walker. She has a window unit and heaters in the home. Patient will discharge home with her son. Daughter lives in [place] and would like for patient to come stay with her for a week or so within the next few weeks after discharge. Patient's son plans to start making patient's home more manageable this winter although patient would prefer for him to wait until spring/summer. He is also considering putting cameras in her home so he can check on her throughout the day. RNCM will follow up with patient and her son today regarding home health needs. No indication for APS report needed. Son appears very supportive. No further concerns. CSW signing  off as social work intervention is no longer needed.  Employment status:  Retired Forensic scientist:  Medicare PT Recommendations:  Home with [place] / Referral to community resources:  Other (Comment Required)(No resources indicated.)  Patient/Family's Response to care:  Patient and her son agreeable to speaking with CSW. Patient's children supportive and involved in patient's care. Patient and her son appreciated social work intervention.  Patient/Family's Understanding of and Emotional Response to Diagnosis, Current Treatment, and Prognosis:  Patient and her son have a good understanding of the reason for admission and social work consult. Patient and her son appear happy with hospital care.  Emotional Assessment Appearance:  Appears stated age Attitude/Demeanor/Rapport:  Engaged, Gracious Affect (typically observed):  Accepting, Appropriate, Calm, Pleasant Orientation:  Oriented to Self, Oriented to Place, Oriented to  Time, Oriented to Situation Alcohol / Substance use:  Never Used Psych involvement (Current and /or in the community):  No (Comment)  Discharge Needs  Concerns to be addressed:  Care Coordination Readmission within the last 30 days:  No Current discharge risk:  None Barriers to Discharge:  Continued Medical Work up   Candie Chroman, LCSW 08/12/2018, 10:49 AM

## 2018-08-12 NOTE — Progress Notes (Addendum)
SATURATION QUALIFICATIONS: (This note is used to comply with regulatory documentation for home oxygen)  Patient Saturations on Room Air at Rest = 86%  Patient Saturations while Ambulating = 90% on 2L via nasal cannula  Pt 94% on 2L O2 at rest  Please briefly explain why patient needs home oxygen: Patient desats with exertion  .

## 2018-08-12 NOTE — Progress Notes (Signed)
ANTICOAGULATION CONSULT NOTE - Initial Consult  Pharmacy Consult for Coumadin Indication: atrial fibrillation  Allergies  Allergen Reactions  . Bee Venom Shortness Of Breath and Swelling  . Iodinated Diagnostic Agents Itching  . Penicillins Swelling  . Tape     Pulls the skin    Patient Measurements: Height: _0  (162.6 cm) Weight: 199 lb 6.4 oz (90.4 kg) IBW/kg (Calculated) : 54.7 Heparin Dosing Weight: n/a  Vital Signs: Temp: 98 F (36.7 C) (11/12 0428) Temp Source: Oral (11/12 0428) BP: 172/95 (11/12 0848) Pulse Rate: 75 (11/12 0848)  Labs: Recent Labs    08/09/18 1633 08/10/18 0324 08/10/18 0657 08/11/18 0719 08/11/18 1047 08/12/18 0459 08/12/18 1031  LABPROT  --   --  30.6*  --  22.3*  --  21.3*  INR  --   --  2.99  --  1.99  --  1.87  CREATININE  --   --  0.92 0.97  --  0.99  --   TROPONINI <0.03 <0.03 <0.03  --   --   --   --     Estimated Creatinine Clearance: 48.5 mL/min (by C-G formula based on SCr of 0.99 mg/dL).   Medical History: Past Medical History:  Diagnosis Date  . Arthritis of back    lumbar  . Atrial fibrillation ([place]) 2010  . Atrial fibrillation ([place])   . Atrial fibrillation ([place])   . Chronic diastolic heart failure ([place])   . Colonic polyp   . Compression fracture of thoracic vertebra (HCC)   . Congestive heart failure, unspecified   . Coronary artery disease    mild non obstructive disease per cardiac cath in 2010  . Degeneration of lumbar or lumbosacral intervertebral disc   . Diverticulosis   . DM2 (diabetes mellitus, type 2) ([place])   . Hemorrhoids   . HLD (hyperlipidemia)   . HTN (hypertension)    diagnosed when she was in her 38s. Refractory. No RAS by [place] in 2010  . Long-term (current) use of anticoagulants   . Lumbosacral spondylosis without myelopathy   . Osteoarthrosis, unspecified whether generalized or localized, unspecified site   . Pain in thoracic spine   . Swelling of limb   . UTI (lower urinary tract  infection)   . Varicose veins of lower extremities with other complications   . Vitamin D deficiency     Medications:  Scheduled:  . atorvastatin  40 mg Oral Daily  . benazepril  20 mg Oral QHS  . carvedilol  25 mg Oral BID WC  . furosemide  20 mg Oral Daily  . insulin aspart  0-5 Units Subcutaneous QHS  . insulin aspart  0-9 Units Subcutaneous TID WC  . isosorbide mononitrate  60 mg Oral Daily  . nystatin   Topical BID  . sodium chloride flush  3 mL Intravenous Q12H  . triamcinolone cream  1 application Topical BID  . Warfarin - Physician Dosing Inpatient   Does not apply q1800    Assessment: 83 yo female on chronic Coumadin for afib.  Admitted with INR = 2.99, but has dropped to 1.8 today despite continuing her home dose of Coumadin 4 mg daily since admission.  No amiodarone or other interacting medications noted.  No overt bleeding or complications noted.  Goal of Therapy:  INR 2-3 Monitor platelets by anticoagulation protocol: Yes   Plan:  1. Increase Coumadin to 6 mg x 1 tonight. 2. Continue daily PT/INR.  Marguerite Olea, [facility] Clinical Pharmacist  Phone [phone]  08/12/2018 11:10 AM

## 2018-08-12 NOTE — Progress Notes (Signed)
Internal Medicine Attending:   I saw and examined the patient. I reviewed the resident's note and I agree with the resident's findings and plan as documented in the resident's note.   Principal Problem:   Acute on chronic diastolic heart failure (HCC) Active Problems:   Atrial fibrillation (HCC)   HTN (hypertension)   Flash pulmonary edema (HCC)   Intertrigo   Pulmonary hypertension assoc with unclear multi-factorial mechanisms [facility])   Hospital day #3 with acute on chronic heart failure with preserved ejection fraction complicated by atrial fibrillation.  We will continue to monitor for rapid ventricular response, continue treatment with carvedilol 25 mg twice daily.  Volume status looks appropriate today.  Still has hypoxia with exertion, we will arrange for home supplemental oxygen as this is probably going to be a chronic issue.  If heart rate remains stable, okay for discharge to home tomorrow.  Arranging for home equipment to be sent to her son's house where she is going to live indefinitely.  She will follow-up with her primary care physician and with the A. fib clinic.  Lalla Brothers, MD

## 2018-08-12 NOTE — Plan of Care (Signed)
  Problem: Education: Goal: Knowledge of General Education information will improve Description Including pain rating scale, medication(s)/side effects and non-pharmacologic comfort measures Outcome: Progressing   Problem: Health Behavior/Discharge Planning: Goal: Ability to manage health-related needs will improve Outcome: Progressing   Problem: Clinical Measurements: Goal: Will remain free from infection Outcome: Progressing Goal: Diagnostic test results will improve Outcome: Progressing Goal: Cardiovascular complication will be avoided Outcome: Progressing   Problem: Activity: Goal: Risk for activity intolerance will decrease Outcome: Progressing   Problem: Elimination: Goal: Will not experience complications related to bowel motility Outcome: Progressing   Problem: Pain Managment: Goal: General experience of comfort will improve Outcome: Progressing   Problem: Safety: Goal: Ability to remain free from injury will improve Outcome: Progressing   Problem: Skin Integrity: Goal: Risk for impaired skin integrity will decrease Outcome: Progressing   Problem: Education: Goal: Ability to demonstrate management of disease process will improve Outcome: Progressing Goal: Ability to verbalize understanding of medication therapies will improve Outcome: Progressing   Problem: Activity: Goal: Capacity to carry out activities will improve Outcome: Progressing   Problem: Cardiac: Goal: Ability to achieve and maintain adequate cardiopulmonary perfusion will improve Outcome: Progressing   Problem: Clinical Measurements: Goal: Ability to maintain clinical measurements within normal limits will improve Outcome: Not Met (add Reason) Goal: Respiratory complications will improve Outcome: Not Met (add Reason)   Problem: Elimination: Goal: Will not experience complications related to urinary retention Outcome: Not Met (add Reason)   Problem: Education: Goal: Individualized  Educational Video(s) Outcome: Not Met (add Reason)

## 2018-08-12 NOTE — Progress Notes (Signed)
Occupational Therapy Treatment Patient Details Name: Rebekah Stewart MRN: [id number] DOB: 1935-08-24 Today's Date: 08/12/2018    History of present illness Pt admitted with SOB and chest pain; worked up for acute on chronic diastolic HF. PMH includes afib, CAD, CHF, DM, gout. Per chart and pt, she is a severe hoarder.    OT comments  Pt progressing towards established OT goals. Pt performing four grooming tasks at sink with supervision for safety. Pt performing functional mobility with cane and Supervision-Min Guard A. Pt with pain at left foot due to gout in left toes impacting her functional performance. Recommend dc to HHOT and planning on dc to son's home. Will continue to follow acutely as admitted.    Follow Up Recommendations  Home health OT    Equipment Recommendations  None recommended by OT    Recommendations for Other Services      Precautions / Restrictions Precautions Precautions: Fall Precaution Comments: Watch O2 Restrictions Weight Bearing Restrictions: No       Mobility Bed Mobility Overal bed mobility: Modified Independent             General bed mobility comments: OOB with PT upon arrival  Transfers Overall transfer level: Needs assistance Equipment used: Quad cane Transfers: Sit to/from Stand Sit to Stand: Supervision         General transfer comment: supervision for safety    Balance Overall balance assessment: Needs assistance   Sitting balance-Leahy Scale: Fair Sitting balance - Comments: Cannot reach feet, required assist to don socks; reports wearing slip on shoes at home     Standing balance-Leahy Scale: Fair Standing balance comment: Can static stand at sink without UE support                            ADL either performed or assessed with clinical judgement   ADL Overall ADL's : Needs assistance/impaired     Grooming: Supervision/safety;Set up;Standing;Wash/dry hands;Wash/dry face;Oral care;Brushing hair Grooming  Details (indicate cue type and reason): Pt performing grooming at sink with supervision for safety.                 Toilet Transfer: Supervision/safety;Ambulation(simulated to recliner)           Functional mobility during ADLs: Supervision/safety;Cane;Min guard General ADL Comments: Pt SpO2 at 90% on 2L. Educating pt on purse lip breathing.      Vision       Perception     Praxis      Cognition Arousal/Alertness: Awake/alert Behavior During Therapy: WFL for tasks assessed/performed Overall Cognitive Status: Within Functional Limits for tasks assessed                                          Exercises     Shoulder Instructions       General Comments Son present; plans to have pt to d/c to his house    Pertinent Vitals/ Pain       Pain Assessment: Faces Faces Pain Scale: Hurts even more Pain Location: L great toe Pain Descriptors / Indicators: Sore;Grimacing Pain Intervention(s): Monitored during session;Limited activity within patient's tolerance;Repositioned  Home Living  Prior Functioning/Environment              Frequency  Min 2X/week        Progress Toward Goals  OT Goals(current goals can now be found in the care plan section)  Progress towards OT goals: Progressing toward goals  Acute Rehab OT Goals Patient Stated Goal: to breathe better OT Goal Formulation: With patient Time For Goal Achievement: 08/24/18 Potential to Achieve Goals: Good ADL Goals Pt Will Perform Grooming: with modified independence;standing Pt Will Perform Lower Body Dressing: with modified independence;sit to/from stand Pt Will Transfer to Toilet: with modified independence;ambulating;bedside commode(over toilet) Pt Will Perform Toileting - Clothing Manipulation and hygiene: with modified independence;sit to/from stand Additional ADL Goal #1: Pt will state at least 3 energy conservation  strategies in ADL independently. Additional ADL Goal #2: Pt will gather items necessary for ADL around her room modified independently, managing 02 line as needed.  Plan Discharge plan remains appropriate    Co-evaluation                 AM-PAC PT "6 Clicks" Daily Activity     Outcome Measure   Help from another person eating meals?: None Help from another person taking care of personal grooming?: A Little Help from another person toileting, which includes using toliet, bedpan, or urinal?: A Little Help from another person bathing (including washing, rinsing, drying)?: A Little Help from another person to put on and taking off regular upper body clothing?: None Help from another person to put on and taking off regular lower body clothing?: A Little 6 Click Score: 20    End of Session Equipment Utilized During Treatment: Gait belt;Oxygen(2L)  OT Visit Diagnosis: Unsteadiness on feet (R26.81);Other abnormalities of gait and mobility (R26.89);Other (comment)(decreased activity tolerance)   Activity Tolerance Patient limited by fatigue   Patient Left in chair;with call bell/phone within reach;with family/visitor present   Nurse Communication Mobility status        Time: 1443-2469 OT Time Calculation (min): 20 min  Charges: OT General Charges $OT Visit: 1 Visit OT Treatments $Self Care/Home Management : 8-22 mins  [place], OTR/L Acute Rehab Pager: [phone] Office: [place] 08/12/2018, 9:25 AM

## 2018-08-12 NOTE — Progress Notes (Signed)
Pt complains of 10/10 pain in her L big toe. Pt has chronic gout. I paged the provider and he said they will relay the message in the morning and start pt back on her home gout meds.

## 2018-08-12 NOTE — Care Management Important Message (Signed)
Important Message  Patient Details  Name: PAQUITA PRINTY MRN: [id number] Date of Birth: 19-Aug-1935   Medicare Important Message Given:  Yes    Seydou Hearns P Taletha Twiford 08/12/2018, 2:13 PM

## 2018-08-12 NOTE — Care Management Note (Addendum)
Case Management Note  Patient Details  Name: Rebekah Stewart MRN: [id number] Date of Birth: Dec 28, 1934  Subjective/Objective:       CHF            Action/Plan: Patient is to stay with her son Rebekah Stewart at discharge Address [address],[place] [address] Cell# [phone]  PCP is Dr [name], Rebekah Havens, MD; has private insurance with Medicare; patient possible need home oxygen; awaiting for qualifying oxygen saturations. HHC choice offered, son requested [place]; Rebekah [facility] with [facility] called for arrangements. CM will continue to follow for progression of care. Rebekah Lorriane Dehart RN,MHA,BSN  1:15 pm - Patient qualifies for home oxygen; oxygen ordered through [facility]; CM talked to [place] with [facility] for referral; portable 02 tank to be delivered to the patient's room the day of discharge and more tanks will be delivered to the son's home. Rebekah Slicker RN,MHA,BSN  Expected Discharge Date:    possibly 08/12/2018              Expected Discharge Plan:  [place]  In-House Referral:   [facility]  Discharge planning Services  CM Consult  Choice offered to:  Adult Children  HH Arranged:  PT HH Agency:    [facility] Status of Service:  In process, will continue to follow  Rebekah Stewart [phone] 08/12/2018, 11:04 AM

## 2018-08-12 NOTE — Progress Notes (Signed)
Physical Therapy Treatment Patient Details Name: Rebekah Stewart MRN: [id number] DOB: 02/25/1935 Today's Date: 08/12/2018    History of Present Illness Pt admitted with SOB and chest pain; worked up for acute on chronic diastolic HF. PMH includes afib, CAD, CHF, DM, gout. Per chart and pt, she is a severe hoarder.    PT Comments    Pt limited by c/o L toe gout pain this session. Discussed use of RW to offload LLE with mobility, but pt able to amb short distance with quad cane and supervision to min guard for balance. Son present and reports pt plans to discharge to his home to allow for increased supervision. Recommend follow-up with HHPT services.   SpO2 down to 84% on RA; returned to >/90% on 2L O2 [place]    Follow Up Recommendations  Home health PT;Supervision for mobility/OOB     Equipment Recommendations  None recommended by PT    Recommendations for Other Services       Precautions / Restrictions Precautions Precautions: Fall Precaution Comments: Watch O2 Restrictions Weight Bearing Restrictions: No    Mobility  Bed Mobility Overal bed mobility: Modified Independent             General bed mobility comments: HOB elevated  Transfers Overall transfer level: Needs assistance Equipment used: Quad cane Transfers: Sit to/from Stand Sit to Stand: Supervision         General transfer comment: Suggested use of RW to offload LLE if toe pain too great, but pt still able to stand to quad cane; supervision for safety  Ambulation/Gait Ambulation/Gait assistance: Min guard;Supervision Gait Distance (Feet): 6 Feet Assistive device: Quad cane Gait Pattern/deviations: Step-to pattern;Decreased weight shift to left;Trunk flexed Gait velocity: Decreased Gait velocity interpretation: <1.31 ft/sec, indicative of household ambulator General Gait Details: Initial min guard secondary to c/o significant toe pain; suggested use of RW to offload LLE if pain too great, but pt still  able to amb short distance with quad cane   Stairs             Wheelchair Mobility    Modified Rankin (Stroke Patients Only)       Balance Overall balance assessment: Needs assistance   Sitting balance-Leahy Scale: Fair Sitting balance - Comments: Cannot reach feet, required assist to don socks; reports wearing slip on shoes at home     Standing balance-Leahy Scale: Fair Standing balance comment: Can static stand at sink without UE support                             Cognition Arousal/Alertness: Awake/alert Behavior During Therapy: WFL for tasks assessed/performed Overall Cognitive Status: Within Functional Limits for tasks assessed                                        Exercises      General Comments General comments (skin integrity, edema, etc.): Son present; plans to have pt to d/c to his house      Pertinent Vitals/Pain Pain Assessment: Faces Faces Pain Scale: Hurts even more Pain Location: L great toe Pain Descriptors / Indicators: Sore;Grimacing Pain Intervention(s): Monitored during session;Limited activity within patient's tolerance    Home Living                      Prior Function  PT Goals (current goals can now be found in the care plan section) Acute Rehab PT Goals Patient Stated Goal: to breathe better PT Goal Formulation: With patient Time For Goal Achievement: 08/24/18 Potential to Achieve Goals: Good Progress towards PT goals: Not progressing toward goals - comment(Limited by pain)    Frequency    Min 3X/week      PT Plan Current plan remains appropriate    Co-evaluation              AM-PAC PT "6 Clicks" Daily Activity  Outcome Measure  Difficulty turning over in bed (including adjusting bedclothes, sheets and blankets)?: None Difficulty moving from lying on back to sitting on the side of the bed? : None Difficulty sitting down on and standing up from a chair with  arms (e.g., wheelchair, bedside commode, etc,.)?: A Little Help needed moving to and from a bed to chair (including a wheelchair)?: A Little Help needed walking in hospital room?: A Little Help needed climbing 3-5 steps with a railing? : A Little 6 Click Score: 20    End of Session Equipment Utilized During Treatment: Oxygen;Gait belt Activity Tolerance: Patient limited by pain Patient left: with family/visitor present;Other (comment)(standing at sink with OT) Nurse Communication: Mobility status PT Visit Diagnosis: Difficulty in walking, not elsewhere classified (R26.2)     Time: 5396-7289 PT Time Calculation (min) (ACUTE ONLY): 18 min  Charges:  $Therapeutic Activity: 8-22 mins                     Mabeline Caras, PT, DPT Acute Rehabilitation Services  Pager [phone] [facility] [place] 08/12/2018, 9:14 AM

## 2018-08-13 DIAGNOSIS — Z880 Allergy status to penicillin: Secondary | ICD-10-CM

## 2018-08-13 DIAGNOSIS — R400 Somnolence: Secondary | ICD-10-CM

## 2018-08-13 DIAGNOSIS — Z91030 Bee allergy status: Secondary | ICD-10-CM

## 2018-08-13 DIAGNOSIS — E119 Type 2 diabetes mellitus without complications: Secondary | ICD-10-CM

## 2018-08-13 DIAGNOSIS — Z91041 Radiographic dye allergy status: Secondary | ICD-10-CM

## 2018-08-13 DIAGNOSIS — Z91048 Other nonmedicinal substance allergy status: Secondary | ICD-10-CM

## 2018-08-13 DIAGNOSIS — M542 Cervicalgia: Secondary | ICD-10-CM

## 2018-08-13 LAB — PROTIME-INR
INR: 1.85
Prothrombin Time: 21.1 seconds — ABNORMAL HIGH (ref 11.4–15.2)

## 2018-08-13 LAB — GLUCOSE, CAPILLARY: GLUCOSE-CAPILLARY: 118 mg/dL — AB (ref 70–99)

## 2018-08-13 MED ORDER — WARFARIN SODIUM 4 MG PO TABS: 4.0000 mg | ORAL_TABLET | Freq: Once | ORAL | 1 refills | Status: DC

## 2018-08-13 MED ORDER — WARFARIN SODIUM 3 MG PO TABS: 6.0000 mg | ORAL_TABLET | Freq: Once | ORAL | Status: DC

## 2018-08-13 MED ORDER — BENAZEPRIL HCL 20 MG PO TABS: 20.0000 mg | ORAL_TABLET | Freq: Every day | ORAL | 1 refills | Status: DC

## 2018-08-13 MED ORDER — METFORMIN HCL 500 MG PO TABS: 500.0000 mg | ORAL_TABLET | Freq: Three times a day (TID) | ORAL | 1 refills | Status: AC

## 2018-08-13 MED ORDER — FUROSEMIDE 20 MG PO TABS: 40.0000 mg | ORAL_TABLET | Freq: Every day | ORAL | 1 refills | Status: DC

## 2018-08-13 MED ORDER — ALLOPURINOL 300 MG PO TABS: 300.0000 mg | ORAL_TABLET | Freq: Every day | ORAL | 1 refills | Status: AC | PRN

## 2018-08-13 MED ORDER — COLCHICINE 0.6 MG PO TABS: 0.6000 mg | ORAL_TABLET | Freq: Every day | ORAL | 0 refills | Status: DC

## 2018-08-13 MED ORDER — GLUCOSE BLOOD VI STRP: ORAL_STRIP | 0 refills | Status: AC

## 2018-08-13 MED ORDER — ATORVASTATIN CALCIUM 40 MG PO TABS: 40.0000 mg | ORAL_TABLET | Freq: Every day | ORAL | 1 refills | Status: DC

## 2018-08-13 MED ORDER — CARVEDILOL 25 MG PO TABS: 25.0000 mg | ORAL_TABLET | Freq: Two times a day (BID) | ORAL | 1 refills | Status: DC

## 2018-08-13 MED ORDER — ISOSORBIDE MONONITRATE ER 60 MG PO TB24: 60.0000 mg | ORAL_TABLET | Freq: Every day | ORAL | 1 refills | Status: DC

## 2018-08-13 MED ORDER — NYSTATIN 100000 UNIT/GM EX POWD: Freq: Two times a day (BID) | CUTANEOUS | 0 refills | Status: DC

## 2018-08-13 NOTE — Progress Notes (Signed)
   Subjective:  She is feeling this morning. Continues to have some musculoskeletal pain in her neck. No SOB with [place] in place. She continues to have some pain in left great toe.   Objective:  Vital signs in last 24 hours: Vitals:   08/12/18 0848 08/12/18 1303 08/12/18 2044 08/13/18 0402  BP: (!) 172/95 136/81 123/70 124/64  Pulse: 75 77 82 (!) 57  Resp:  18 20 (!) 24  Temp:  99.1 F (37.3 C) 97.9 F (36.6 C) 97.8 F (36.6 C)  TempSrc:  Oral Oral Oral  SpO2:  99% 96% 97%  Weight:    91.4 kg  Height:       Physical Exam:  Constitution: NAD, lying supine in bed HEENT: AT, [place], [place] in place  Respiratory: non-labored breathing MSK: moving all extremities, no pitting edema  Neuro: a&o, pleasant, normal affect Skin: c/d/i    Assessment/Plan:  Principal Problem:   Acute on chronic diastolic heart failure (HCC) Active Problems:   Atrial fibrillation (HCC)   HTN (hypertension)   Flash pulmonary edema (HCC)   Intertrigo   Pulmonary hypertension assoc with unclear multi-factorial mechanisms (HCC)   DM2 (diabetes mellitus, type 2) (HCC)  Pulmonary Hypertension Acute on Chronic Diastolic Heart Failure Atrial Fibrillation Euvolemic on exam today with no SOB and using supplemental O2. She will be going to stay with her son and they have prepared the house for her with home health ordered. She will be sent with 2L supplemental O2 prn.    - increase po lasix 40 mg po  - cont. Home bp medications  - wean supplemental O2 for >92% - duo-neb q6h prn  - a.fib clinic and PCP f/u scheduled   Acute Gout Flare Continues to have some pain in left great toe, improved from yesterday. Inflammation present but decreased as well.   - colchicine .6 mg prn until pain resolves  - discussed beginning allopurinol per PCP after gout flare    VTE: coumadin per pharm IVF: none  Diet: heart healthy/carb modified  Code: Full  Dispo: Anticipated discharge today.   Erika Slaby A,  DO 08/13/2018, 10:14 AM Pager: [phone]

## 2018-08-13 NOTE — Progress Notes (Signed)
Occupational Therapy Treatment Patient Details Name: Rebekah Stewart MRN: [id number] DOB: [birth year] Today's Date: 08/13/2018    History of present illness Pt admitted with SOB and chest pain; worked up for acute on chronic diastolic HF. PMH includes afib, CAD, CHF, DM, gout. Per chart and pt, she is a severe hoarder.    OT comments  Patient is d/c home with son and will have [facility] services. Patient was seen for skilled OT today to increase patient I and safety with ADLs and mobility. Patient was able to stand at sink for 8 min performing grooming tasks. Patient was able to don socks today without AE. Patient was able to AMB to bathroom at S level with quad cane. Patient is motivated to work with therapy  Follow Up Recommendations  Home health OT    Equipment Recommendations  (rollator)    Recommendations for Other Services      Precautions / Restrictions Precautions Precautions: Fall Precaution Comments: Watch O2 Restrictions Weight Bearing Restrictions: No       Mobility Bed Mobility                  Transfers       Sit to Stand: Supervision              Balance                                           ADL either performed or assessed with clinical judgement   ADL       Grooming: Wash/dry hands;Wash/dry face;Oral care;Brushing hair;Standing;Supervision/safety               Lower Body Dressing: Supervision/safety;Set up;Sit to/from stand   Toilet Transfer: Supervision/safety;Ambulation;Comfort height toilet;Grab bars   Toileting- Clothing Manipulation and Hygiene: Modified independent       Functional mobility during ADLs: Supervision/safety General ADL Comments: Patient is requiring extra time to perform task.     Vision       Perception     Praxis      Cognition Arousal/Alertness: Awake/alert Behavior During Therapy: WFL for tasks assessed/performed Overall Cognitive Status: Within Functional Limits for tasks  assessed                                          Exercises     Shoulder Instructions       General Comments      Pertinent Vitals/ Pain       Pain Assessment: 0-10 Pain Score: 2  Pain Location: L great toe Pain Descriptors / Indicators: Sore Pain Intervention(s): Monitored during session  Home Living                                          Prior Functioning/Environment              Frequency  Min 2X/week        Progress Toward Goals  OT Goals(current goals can now be found in the care plan section)  Progress towards OT goals: Progressing toward goals  Acute Rehab OT Goals Patient Stated Goal: to go home  Plan Discharge plan remains appropriate    Co-evaluation  AM-PAC PT "6 Clicks" Daily Activity     Outcome Measure   Help from another person eating meals?: None Help from another person taking care of personal grooming?: A Little Help from another person toileting, which includes using toliet, bedpan, or urinal?: A Little Help from another person bathing (including washing, rinsing, drying)?: A Little Help from another person to put on and taking off regular upper body clothing?: None Help from another person to put on and taking off regular lower body clothing?: A Little 6 Click Score: 20    End of Session Equipment Utilized During Treatment: Gait belt;Oxygen;Other (comment)(quad cane)  OT Visit Diagnosis: Unsteadiness on feet (R26.81)   Activity Tolerance Patient tolerated treatment well   Patient Left in chair;with call bell/phone within reach;with family/visitor present   Nurse Communication (ok therapy)        Time: 0950-1030 OT Time Calculation (min): 40 min  Charges: OT General Charges $OT Visit: 1 Visit OT Treatments $Self Care/Home Management : 46-95 mins  6 clicks   Jadie Allington 08/13/2018, 10:40 AM

## 2018-08-13 NOTE — Progress Notes (Signed)
Rollator delivered to room, given to patient and son.

## 2018-08-13 NOTE — Progress Notes (Addendum)
Spoke with son [name] explaining [facility] services for home health and home oxygen. Verified with Caryl Pina of [employer] that tanks for transportation home will be delivered to the hospital room today. Requested rollator to be delivered to room prior to DC

## 2018-08-13 NOTE — Progress Notes (Signed)
Patient discharged: Home with son  Via: Wheelchair   Discharge paperwork given: to patient and family  Reviewed with teach back   Belongings given to patient  Oxygen tank deliver, rollator  will not be delivered per son. He was contacted by [facility] agency and they will deliver it home since they do not have it in [place] today. Patient and son agreeable with plan.  Devonta Blanford, RN

## 2018-08-13 NOTE — Progress Notes (Signed)
[facility] for Coumadin Indication: atrial fibrillation  Allergies  Allergen Reactions  . Bee Venom Shortness Of Breath and Swelling  . Iodinated Diagnostic Agents Itching  . Penicillins Swelling  . Tape     Pulls the skin    Patient Measurements: Height: _0  (162.6 cm) Weight: 201 lb 8 oz (91.4 kg) IBW/kg (Calculated) : 54.7 Heparin Dosing Weight: n/a  Vital Signs: Temp: 97.8 F (36.6 C) (11/13 0402) Temp Source: Oral (11/13 0402) BP: 124/64 (11/13 0402) Pulse Rate: 57 (11/13 0402)  Labs: Recent Labs    08/11/18 0719 08/11/18 1047 08/12/18 0459 08/12/18 1031 08/13/18 0354  LABPROT  --  22.3*  --  21.3* 21.1*  INR  --  1.99  --  1.87 1.85  CREATININE 0.97  --  0.99  --   --     Estimated Creatinine Clearance: 48.8 mL/min (by C-G formula based on SCr of 0.99 mg/dL).   Assessment: 83 yo female on chronic Coumadin for afib.  Admitted with INR = 2.99, but has dropped to 1.85 today; dose was increased yesterday. No amiodarone or other interacting medications noted. No overt bleeding or complications noted.  Home dose of Coumadin 4 mg daily  Goal of Therapy:  INR 2-3 Monitor platelets by anticoagulation protocol: Yes   Plan:  1. Repeat Coumadin 6 mg PO tonight. If goes home today, would recommend start home regimen tomorrow 2. Continue daily PT/INR. 3. Monitor for s/sx of bleeding   Renold Genta, PharmD, BCPS Clinical Pharmacist Clinical phone for 08/13/2018 until 3p is [phone] 08/13/2018 8:48 AM  **Pharmacist phone directory can now be found on [email or web address] listed under [place]**

## 2018-08-13 NOTE — Progress Notes (Signed)
Telemetry  D/C.  IV and paperwork given by Gertha Calkin, Charge nurse.  Oxygen tank delivered, awaiting on rollator.  Tyri Elmore, RN

## 2018-08-14 DIAGNOSIS — I4891 Unspecified atrial fibrillation: Secondary | ICD-10-CM | POA: Diagnosis not present

## 2018-08-14 DIAGNOSIS — I272 Pulmonary hypertension, unspecified: Secondary | ICD-10-CM | POA: Diagnosis not present

## 2018-08-14 DIAGNOSIS — I110 Hypertensive heart disease with heart failure: Secondary | ICD-10-CM | POA: Diagnosis not present

## 2018-08-14 DIAGNOSIS — Z9981 Dependence on supplemental oxygen: Secondary | ICD-10-CM | POA: Diagnosis not present

## 2018-08-14 DIAGNOSIS — Z7984 Long term (current) use of oral hypoglycemic drugs: Secondary | ICD-10-CM | POA: Diagnosis not present

## 2018-08-14 DIAGNOSIS — E119 Type 2 diabetes mellitus without complications: Secondary | ICD-10-CM | POA: Diagnosis not present

## 2018-08-14 DIAGNOSIS — F423 Hoarding disorder: Secondary | ICD-10-CM | POA: Diagnosis not present

## 2018-08-14 DIAGNOSIS — I2510 Atherosclerotic heart disease of native coronary artery without angina pectoris: Secondary | ICD-10-CM | POA: Diagnosis not present

## 2018-08-14 DIAGNOSIS — I082 Rheumatic disorders of both aortic and tricuspid valves: Secondary | ICD-10-CM | POA: Diagnosis not present

## 2018-08-14 DIAGNOSIS — Z7901 Long term (current) use of anticoagulants: Secondary | ICD-10-CM | POA: Diagnosis not present

## 2018-08-14 DIAGNOSIS — M109 Gout, unspecified: Secondary | ICD-10-CM | POA: Diagnosis not present

## 2018-08-14 DIAGNOSIS — I5033 Acute on chronic diastolic (congestive) heart failure: Secondary | ICD-10-CM | POA: Diagnosis not present

## 2018-08-14 DIAGNOSIS — Z5181 Encounter for therapeutic drug level monitoring: Secondary | ICD-10-CM | POA: Diagnosis not present

## 2018-08-14 LAB — ALDOSTERONE + RENIN ACTIVITY W/ RATIO: Aldosterone: 7.9 ng/dL (ref 0.0–30.0)

## 2018-08-14 NOTE — Discharge Summary (Addendum)
Name: Rebekah Stewart MRN: [id number] DOB: [birth year] 83 y.o. PCP: Nicoletta Dress, MD  Date of Admission: 08/09/2018  9:58 AM Date of Discharge: 08/12/2018 Attending Physician: Lalla Brothers  Discharge Diagnosis: 1. Acute on Chronic Diastolic Heart Failure  3. Atrial Fibrillation 3. Acute Gout 4. Hoarding Disorder  Discharge Medications: Allergies as of 08/13/2018      Reactions   Bee Venom Shortness Of Breath, Swelling   Iodinated Diagnostic Agents Itching   Penicillins Swelling   Tape    Pulls the skin      Medication List    TAKE these medications   allopurinol 300 MG tablet Commonly known as:  ZYLOPRIM Take 1 tablet (300 mg total) by mouth daily as needed (for gout).   atorvastatin 40 MG tablet Commonly known as:  LIPITOR Take 1 tablet (40 mg total) by mouth daily.   benazepril 20 MG tablet Commonly known as:  LOTENSIN Take 1 tablet (20 mg total) by mouth daily.   carvedilol 25 MG tablet Commonly known as:  COREG Take 1 tablet (25 mg total) by mouth 2 (two) times daily with a meal.   CHERRY PO Take by mouth.   colchicine 0.6 MG tablet Take 1 tablet (0.6 mg total) by mouth daily. May stop taking if better after 3-4 days   fish oil-omega-3 fatty acids 1000 MG capsule Take 2 g by mouth 2 (two) times daily.   furosemide 20 MG tablet Commonly known as:  LASIX Take 2 tablets (40 mg total) by mouth daily. What changed:  how much to take   glucose blood test strip Check two times daily. Dx code E11.65   isosorbide mononitrate 60 MG 24 hr tablet Commonly known as:  IMDUR Take 1 tablet (60 mg total) by mouth daily.   metaxalone 800 MG tablet Commonly known as:  SKELAXIN Take 800 mg by mouth 3 (three) times daily as needed for muscle spasms.   metFORMIN 500 MG tablet Commonly known as:  GLUCOPHAGE Take 1 tablet (500 mg total) by mouth 3 (three) times daily with meals.   nystatin powder Commonly known as:  MYCOSTATIN/NYSTOP Apply topically 2  (two) times daily.   triamcinolone cream 0.1 % Commonly known as:  KENALOG Apply 1 application topically 2 (two) times daily.   Vitamin D 50 MCG (2000 UT) Caps Take 1 capsule by mouth daily.   warfarin 4 MG tablet Commonly known as:  COUMADIN Take 1 tablet (4 mg total) by mouth one time only at 6 PM. Use as directed.       Disposition and follow-up:   Rebekah Stewart was discharged from [facility] in Stable condition.  At the hospital follow up visit please address:  1.  Acute on Chronic Diastolic Heart Failure: Discharged with new 2L supplemental O2 and improved but continued SOB despite diuresis. ECHO showing propable Pulmary HTN. Referral to a. Fib clinic scheduled and needs OSA sleep study as she has symptoms of daytime somnolence and repeated awakening throughout the night. Lasix increased to 40 mg qd and home benazapril 20 mg and coreg 25 mg bid continued at discharge Atrial Fibrillation: rate controlled. Referred to [facility] Atrial Fibrillation clinic  Acute Gout Flare: recently diagnosed. She had not yet filled her allopurinol prescription and developed in the setting of diuresis. Colchicine prescribed until pain resolved, then she will need to start allopurinol prophylactically   Hoarding Disorder: difficult to get around her house due to narrowing of rooms with stuff, increased fall  risk. Going to live with her son.   2.  Labs / imaging needed at time of follow-up: BMP, Uric Acid  3.  Pending labs/ test needing follow-up: none  Follow-up Appointments: Follow-up Information    [facility] Follow [facility].   Specialty:  Home Health Services Why:  For home health services. They will be in contact with you in the next 1-2 days to set up your first home appointment Contact information: [place] [address] [phone]        [place] Follow up.   Why:  For home oxygen. Portable tanks to be delivered to hospital  room prior to DC with set up of home oxygen today.       Nicoletta Dress, MD. Go on 08/20/2018.   Specialty:  Internal Medicine Why:  _0  Contact information: [place] [address] [place] Hospital Course by problem list: Acute on Chronic Heart Failure Atrial Fibrillation  Rebekah Stewart is a 83yo female with PMH of coronary artery disease, chronic diastolic heart failure, atrial fibrillation, essential hypertension, type 2 diabetes, gout, and hoarding disorder who presented due to worsening shortness of breath and intermittent chest tightness over the past two months with sudden acute worsening the morning of admission. She was admitted for acute heart failure exacerbation and placed on 2L . Chest xray showed atelectasis and likely pulmonary edema and echo showed slightly increased PA peak pressure significant for probably pulmonary hypertension. She was diuresed to her baseline weight with improvement in symptoms and blood pressure but unable to wean from supplemental O2. In addition she qualified for home supplemental oxygen as she desaturated on ambulation. She likely has underlying pathology contributing to her heart failure that will need to be addressed further at discharge, such as obstructive sleep apnea or symptomatic atrial fibrillation and may warrant RHC. Supplemental O2 was ordered for home, her lasix was increased to 40 mg qd at discharge and she was referred to the [facility]. Fib clinic and her PCP for close follow-up.  She also has a history of hoarding disorder, making it difficulty to move around her house safely and is at high risk for fall. She was agreeable to going to live with her son and will do outpatient physical therapy.  Acute Gout On hospital day 3 she began to have pain in her left 1st metatarsal significant for gout. She was recently diagnosed and had not yet started her allopurinol prescription. She was  started on colchicine with symptoms improving at discharge. She will need to start her allopurinol prophylactically once her symptoms resolve.   Discharge Vitals:   BP 124/64 (BP Location: Right Arm)   Pulse (!) 57   Temp 97.8 F (36.6 C) (Oral)   Resp (!) 24   Ht _1  (1.626 m)   Wt 91.4 kg   SpO2 97%   BMI 34.59 kg/m   Pertinent Labs, Studies, and Procedures:   Echo 11/10 - Left ventricle: The cavity size was normal. Wall thickness was   increased in a pattern of mild LVH. Systolic function was normal.   The estimated ejection fraction was in the range of 55% to 60%.   Wall motion was normal; there were no regional wall motion   abnormalities. The study was not technically sufficient to allow   evaluation of LV diastolic dysfunction due to atrial   fibrillation. - Aortic  valve: Mildly calcified annulus. Trileaflet. There was   mild regurgitation. - Mitral valve: Mildly calcified annulus. - Left atrium: The atrium was mildly dilated. - Right ventricle: Systolic function was mildly reduced. - Tricuspid valve: There was moderate regurgitation. - Pulmonary arteries: PA peak pressure: 59 mm Hg (S). - Systemic veins: IVC is dilated with normal respiratory variation.   Estimated CVP 8 mmHg.  Chest xr 11/09 IMPRESSION:  Small pleural effusions with basilar atelectasis and suspect mild interstitial pulmonary edema.   By: Markus Daft M.D.   On: 08/09/2018 11:50   Discharge Instructions: Discharge Instructions    Diet - low sodium heart healthy   Complete by:  As directed    Discharge instructions   Complete by:  As directed    Rebekah Stewart,  It was a pleasure taking care of you, I hope you continue to feel better. You were hospitalized for acute on chronic heart failure exacerbation. Thank you for allowing [place] to be part of your care.   We arranged for you to follow up at:   Atrial Fibrillation Clinic at [place]. Time: November 20th at 11:30am  [address], [place] [address] [phone] Please pass the green awning and then you will park in [facility] parking lot. The code is 1800. If you are unable to gain or find the entrance, just call the number above and they will help you.   Dr. Nelda Bucks Appt. Time: November 15th, 2:45 pm [address] [place], [place] [address] [phone] Please call if this time does not work with your schedule, but please make sure to have close follow-up   Please note these changes made to your medications:   Please START taking:   Lasix (furosemide) 40 mg one tablet once per day  Please weigh yourself daily in the mornings. Your current baseline weight is 199lbs. If you gain more than five lbs within one day or began to feel short of breath, please call your primary care physician.   Apply Nystatin powder twice per day to the area of chafing on your lower abdomen and inguinal area until symptoms clear. Keep this area clean and dry.  I have refilled your other medications. Please take these medications as you have been prior to admission.   Increase activity slowly   Complete by:  As directed       Signed: Molli Hazard A, DO 08/14/2018, 2:16 PM   Pager: [phone]

## 2018-08-14 NOTE — Progress Notes (Signed)
Patient ID: Rebekah Stewart, female   DOB: 02/08/1935, 83 y.o.   MRN: [id number]   Patient presents at Dr Leeanne Rio request to be measured for diabetic shoes and inserts with [facility] Certified Pedorthist.  Patient will be called when shoes and inserts arrive to schedule a fitting.

## 2018-08-17 ENCOUNTER — Other Ambulatory Visit: Payer: Self-pay | Admitting: Internal Medicine

## 2018-08-18 ENCOUNTER — Other Ambulatory Visit: Payer: Self-pay

## 2018-08-18 DIAGNOSIS — I110 Hypertensive heart disease with heart failure: Secondary | ICD-10-CM | POA: Diagnosis not present

## 2018-08-18 DIAGNOSIS — E119 Type 2 diabetes mellitus without complications: Secondary | ICD-10-CM | POA: Diagnosis not present

## 2018-08-18 DIAGNOSIS — I2510 Atherosclerotic heart disease of native coronary artery without angina pectoris: Secondary | ICD-10-CM | POA: Diagnosis not present

## 2018-08-18 DIAGNOSIS — I5033 Acute on chronic diastolic (congestive) heart failure: Secondary | ICD-10-CM | POA: Diagnosis not present

## 2018-08-18 DIAGNOSIS — M109 Gout, unspecified: Secondary | ICD-10-CM | POA: Diagnosis not present

## 2018-08-18 DIAGNOSIS — I4891 Unspecified atrial fibrillation: Secondary | ICD-10-CM | POA: Diagnosis not present

## 2018-08-18 NOTE — Patient Outreach (Signed)
[facility]) Care Management  08/18/2018  Rebekah Stewart 1935/07/16 [id number]     EMMI-HF RED ON EMMI ALERT Day # 3 Date: 08/17/18 Red Alert Reason: " Any new problems? Yes  New/worsening problems? Yes  New swelling? Yes  New/worsening SOB? Yes  Nausea or vomiting? Yes  Tired/fatigued? Yes Lightheaded or dizzy? Yes Fever or chills? Yes  Other symptoms/problems? Yes"   Outreach attempt # 1 to patient. Spoke with patient who voices that she is doing well. She reports that she is currently staying with her son in [place] while she is recovering form hospitalization. Reviewed and addressed red alerts with patient. She states that the machine was not hearing her correctly. She denies any and all of these symptoms. She is pleased to voice that she is doing well and just fine. She states that [facility] services have been out to see her and will be out again later today. Patient has several MD follow up appts in place. She goes to see PCP on 08/22/18 and has an appt at A-fib clinic this week. She has to call and make [facility] heart care appt. Patient voices that her children take her to appts. She reports that she has her meds and got her pharmacy to transfer her meds to a closer pharmacy near her son. RN CM confirmed with patient that she is monitoring weight. She voices that she has new scale and her weight this morning was down to 186.2 lbs from 188 lbs previously. She denies any edema or swelling. No SOB reported. Patient aware of s/s of worsening condition and when to alert MD of changes in weight. She voices that she keeps notebook of weights and is adhering to low salt diet and diuretic regimen. She denies any further RN CM needs or concerns at this time. Advised patient that they would continue to get automated EMMI- HF post discharge calls to assess how they are doing following recent hospitalization and will receive a call from a nurse if any of their responses were abnormal. Patient  voiced understanding and was appreciative of f/u call.    Plan: RN CM will close case at this time as no further interventions needed at this time.   Enzo Montgomery, RN,BSN,CCM [place] Management Telephonic Care Management Coordinator Direct Phone: [phone] Toll Free: [phone] Fax: [phone]

## 2018-08-19 ENCOUNTER — Other Ambulatory Visit: Payer: Self-pay | Admitting: Internal Medicine

## 2018-08-19 ENCOUNTER — Ambulatory Visit (INDEPENDENT_AMBULATORY_CARE_PROVIDER_SITE_OTHER): Payer: Medicare Other

## 2018-08-19 ENCOUNTER — Telehealth: Payer: Self-pay | Admitting: Cardiovascular Disease

## 2018-08-19 DIAGNOSIS — Z7901 Long term (current) use of anticoagulants: Secondary | ICD-10-CM | POA: Diagnosis not present

## 2018-08-19 DIAGNOSIS — I4891 Unspecified atrial fibrillation: Secondary | ICD-10-CM | POA: Diagnosis not present

## 2018-08-19 DIAGNOSIS — E119 Type 2 diabetes mellitus without complications: Secondary | ICD-10-CM | POA: Diagnosis not present

## 2018-08-19 DIAGNOSIS — I5033 Acute on chronic diastolic (congestive) heart failure: Secondary | ICD-10-CM | POA: Diagnosis not present

## 2018-08-19 DIAGNOSIS — M109 Gout, unspecified: Secondary | ICD-10-CM | POA: Diagnosis not present

## 2018-08-19 DIAGNOSIS — I2510 Atherosclerotic heart disease of native coronary artery without angina pectoris: Secondary | ICD-10-CM | POA: Diagnosis not present

## 2018-08-19 DIAGNOSIS — I4811 Longstanding persistent atrial fibrillation: Secondary | ICD-10-CM

## 2018-08-19 DIAGNOSIS — I110 Hypertensive heart disease with heart failure: Secondary | ICD-10-CM | POA: Diagnosis not present

## 2018-08-19 LAB — POCT INR: INR: 2.1 (ref 2.0–3.0)

## 2018-08-19 NOTE — Telephone Encounter (Signed)
Follow up     [place] is calling on behalf with the same questions that the patient was calling back. Please call to discuss.

## 2018-08-19 NOTE — Telephone Encounter (Signed)
Spoke with nurse Morey Hummingbird and advised of patients appointment with Afib clinic tomorrow. Will forward question regarding fluid restrictions to Dr Fletcher Anon. Morey Hummingbird is calling patient to advise of INR results and advise patient.

## 2018-08-19 NOTE — Telephone Encounter (Signed)
New Message:     Pt as in the hospital  From 08-09-12 thru 08-13-18. She wants to know if she needs a follow up appt with Dr Fletcher Anon.She have a lot of questions regarding her fluid in take, how long will she need a Home Health Nurse, and  About getting her Pro Time once a week.

## 2018-08-19 NOTE — Telephone Encounter (Signed)
6 to 9 cups of water daily is fine.  Sodium restriction is much more important than fluid restriction in heart failure.

## 2018-08-19 NOTE — Telephone Encounter (Signed)
Patient made aware of Dr. Tyrell Antonio recommendation and verbalized her understanding.

## 2018-08-19 NOTE — Telephone Encounter (Signed)
Received message on 08/14/18 from [place]. Patient will have PT/INR done at home on Monday 08/18/18 and results will be called to [facility].   [facility] had not called results yet. Orders for INR will be competed today 08/19/18.

## 2018-08-20 ENCOUNTER — Encounter (HOSPITAL_COMMUNITY): Payer: Self-pay | Admitting: Nurse Practitioner

## 2018-08-20 ENCOUNTER — Ambulatory Visit (HOSPITAL_COMMUNITY)
Admission: RE | Admit: 2018-08-20 | Discharge: 2018-08-20 | Disposition: A | Discharge status: Home / Self Care | Payer: Medicare Other | Source: Ambulatory Visit | Attending: Nurse Practitioner | Admitting: Nurse Practitioner

## 2018-08-20 ENCOUNTER — Other Ambulatory Visit (HOSPITAL_COMMUNITY): Payer: Self-pay | Admitting: *Deleted

## 2018-08-20 ENCOUNTER — Other Ambulatory Visit: Payer: Self-pay

## 2018-08-20 VITALS — BP 100/58 | HR 91 | Ht 64.0 in | Wt 195.0 lb

## 2018-08-20 DIAGNOSIS — Z9889 Other specified postprocedural states: Secondary | ICD-10-CM | POA: Diagnosis not present

## 2018-08-20 DIAGNOSIS — Z79899 Other long term (current) drug therapy: Secondary | ICD-10-CM | POA: Insufficient documentation

## 2018-08-20 DIAGNOSIS — Z8249 Family history of ischemic heart disease and other diseases of the circulatory system: Secondary | ICD-10-CM | POA: Diagnosis not present

## 2018-08-20 DIAGNOSIS — I5033 Acute on chronic diastolic (congestive) heart failure: Secondary | ICD-10-CM | POA: Insufficient documentation

## 2018-08-20 DIAGNOSIS — Z880 Allergy status to penicillin: Secondary | ICD-10-CM | POA: Diagnosis not present

## 2018-08-20 DIAGNOSIS — I482 Chronic atrial fibrillation, unspecified: Secondary | ICD-10-CM

## 2018-08-20 DIAGNOSIS — Z823 Family history of stroke: Secondary | ICD-10-CM | POA: Diagnosis not present

## 2018-08-20 DIAGNOSIS — I110 Hypertensive heart disease with heart failure: Secondary | ICD-10-CM | POA: Insufficient documentation

## 2018-08-20 DIAGNOSIS — E785 Hyperlipidemia, unspecified: Secondary | ICD-10-CM | POA: Insufficient documentation

## 2018-08-20 DIAGNOSIS — Z7901 Long term (current) use of anticoagulants: Secondary | ICD-10-CM | POA: Insufficient documentation

## 2018-08-20 DIAGNOSIS — E119 Type 2 diabetes mellitus without complications: Secondary | ICD-10-CM | POA: Insufficient documentation

## 2018-08-20 DIAGNOSIS — Z7984 Long term (current) use of oral hypoglycemic drugs: Secondary | ICD-10-CM | POA: Diagnosis not present

## 2018-08-20 DIAGNOSIS — Z888 Allergy status to other drugs, medicaments and biological substances status: Secondary | ICD-10-CM | POA: Insufficient documentation

## 2018-08-20 DIAGNOSIS — I2510 Atherosclerotic heart disease of native coronary artery without angina pectoris: Secondary | ICD-10-CM | POA: Insufficient documentation

## 2018-08-20 DIAGNOSIS — I4891 Unspecified atrial fibrillation: Secondary | ICD-10-CM | POA: Diagnosis not present

## 2018-08-20 DIAGNOSIS — I480 Paroxysmal atrial fibrillation: Secondary | ICD-10-CM

## 2018-08-20 DIAGNOSIS — M109 Gout, unspecified: Secondary | ICD-10-CM | POA: Diagnosis not present

## 2018-08-20 LAB — BASIC METABOLIC PANEL
Anion gap: 10 (ref 5–15)
BUN: 32 mg/dL — AB (ref 8–23)
CHLORIDE: 99 mmol/L (ref 98–111)
CO2: 32 mmol/L (ref 22–32)
CREATININE: 1.06 mg/dL — AB (ref 0.44–1.00)
Calcium: 9.3 mg/dL (ref 8.9–10.3)
GFR calc Af Amer: 56 mL/min — ABNORMAL LOW (ref >60)
GFR calc non Af Amer: 48 mL/min — ABNORMAL LOW (ref >60)
GLUCOSE: 125 mg/dL — AB (ref 70–99)
Potassium: 3.4 mmol/L — ABNORMAL LOW (ref 3.5–5.1)
SODIUM: 141 mmol/L (ref 135–145)

## 2018-08-20 MED ORDER — POTASSIUM CHLORIDE CRYS ER 20 MEQ PO TBCR: 20.0000 meq | EXTENDED_RELEASE_TABLET | Freq: Every day | ORAL | 3 refills | Status: DC

## 2018-08-20 NOTE — Progress Notes (Signed)
Primary Care Physician: Nicoletta Dress, MD Referring Physician:  [facility] f/u Cardiologist: Dr. Audry Riles is a 83 y.o. female with a h/o coronary artery disease, chronic diastolic heart failure, atrial fibrillation, essential hypertension, type 2 diabetes, gout, and hoarding disorder who presented due to worsening shortness of breath and intermittent chest tightness over the past two months with sudden acute worsening the morning of admission. She was admitted for acute heart failure exacerbation and placed on 2L [place]. Chest xray showed atelectasis and likely pulmonary edema and echo showed slightly increased PA peak pressure significant for probably pulmonary hypertension. She was diuresed to her baseline weight with improvement in symptoms and blood pressure but unable to wean from supplemental O2. In addition she qualified for home supplemental oxygen as she desaturated on ambulation. She likely has underlying pathology contributing to her heart failure that will need to be addressed further at discharge, such as obstructive sleep apnea or symptomatic atrial fibrillation and may warrant RHC. Supplemental O2 was ordered for home, her lasix was increased to 40 mg qd at discharge and she was referred to the afib clinic for f/u.  She is feeling improved since discharge. She continues to be on O2 by [place]. PO on O2 in office is 96%. She is in rate controlled afib. She has not had a bmet since increase of her lasix. She is doing daily weights and watching closely salt intake. Weight is down another 5 lbs since d/c.  She has noticed some soft BP's. [place] nurse is coming out to see her. She is staying with her son and daughter in law for now.    Today, she denies symptoms of palpitations, chest pain, shortness of breath, orthopnea, PND, lower extremity edema, dizziness, presyncope, syncope, or neurologic sequela. The patient is tolerating medications without difficulties and is otherwise without complaint  today.   Past Medical History:  Diagnosis Date  . Arthritis of back    lumbar  . Atrial fibrillation ([place]) 2010  . Atrial fibrillation ([place])   . Atrial fibrillation ([place])   . Chronic diastolic heart failure ([place])   . Colonic polyp   . Compression fracture of thoracic vertebra ([facility])   . Congestive heart failure, unspecified   . Coronary artery disease    mild non obstructive disease per cardiac cath in 2010  . Degeneration of lumbar or lumbosacral intervertebral disc   . Diverticulosis   . DM2 (diabetes mellitus, type 2) ([place])   . Hemorrhoids   . HLD (hyperlipidemia)   . HTN (hypertension)    diagnosed when she was in her 63s. Refractory. No RAS by [place] in 2010  . Long-term (current) use of anticoagulants   . Lumbosacral spondylosis without myelopathy   . Osteoarthrosis, unspecified whether generalized or localized, unspecified site   . Pain in thoracic spine   . Swelling of limb   . UTI (lower urinary tract infection)   . Varicose veins of lower extremities with other complications   . Vitamin D deficiency    Past Surgical History:  Procedure Laterality Date  . ADENOIDECTOMY    . CARDIAC CATHETERIZATION  2010   At [place]. Mild nonobstructive CAD  . CORONARY ANGIOPLASTY    . EYE SURGERY    . TONSILLECTOMY      Current Outpatient Medications  Medication Sig Dispense Refill  . atorvastatin (LIPITOR) 40 MG tablet TAKE 1 TABLET BY MOUTH EVERY DAY 30 tablet 1  . benazepril (LOTENSIN) 20 MG tablet  Take 1 tablet (20 mg total) by mouth daily. 30 tablet 1  . carvedilol (COREG) 25 MG tablet Take 1 tablet (25 mg total) by mouth 2 (two) times daily with a meal. 60 tablet 1  . Cholecalciferol (VITAMIN D) 2000 UNITS CAPS Take 1 capsule by mouth daily.    . fish oil-omega-3 fatty acids 1000 MG capsule Take 2 g by mouth 2 (two) times daily.     . furosemide (LASIX) 20 MG tablet Take 2 tablets (40 mg total) by mouth daily. 30 tablet 1  . glucose blood (ACCU-CHEK AVIVA PLUS) test  strip Check two times daily. Dx code E11.65 100 each 0  . isosorbide mononitrate (IMDUR) 60 MG 24 hr tablet Take 1 tablet (60 mg total) by mouth daily. 30 tablet 1  . metFORMIN (GLUCOPHAGE) 500 MG tablet Take 1 tablet (500 mg total) by mouth 3 (three) times daily with meals. 90 tablet 1  . nystatin (MYCOSTATIN/NYSTOP) powder Apply topically 2 (two) times daily. 15 g 0  . triamcinolone cream (KENALOG) 0.1 % Apply 1 application topically 2 (two) times daily.    Marland Kitchen warfarin (COUMADIN) 4 MG tablet Take 1 tablet (4 mg total) by mouth one time only at 6 PM. Use as directed. 30 tablet 1  . allopurinol (ZYLOPRIM) 300 MG tablet Take 1 tablet (300 mg total) by mouth daily as needed (for gout). (Patient not taking: Reported on 08/20/2018) 30 tablet 1   No current facility-administered medications for this encounter.     Allergies  Allergen Reactions  . Bee Venom Shortness Of Breath and Swelling  . Iodinated Diagnostic Agents Itching  . Penicillins Swelling  . Tape     Pulls the skin    Social History   Socioeconomic History  . Marital status: Widowed    Spouse name: Not on file  . Number of children: Not on file  . Years of education: Not on file  . Highest education level: Not on file  Occupational History  . Occupation: retired  Scientific laboratory technician  . Financial resource strain: Not on file  . Food insecurity:    Worry: Not on file    Inability: Not on file  . Transportation needs:    Medical: Not on file    Non-medical: Not on file  Tobacco Use  . Smoking status: Never Smoker  . Smokeless tobacco: Never Used  Substance and Sexual Activity  . Alcohol use: No  . Drug use: No  . Sexual activity: Not Currently  Lifestyle  . Physical activity:    Days per week: Not on file    Minutes per session: Not on file  . Stress: Not on file  Relationships  . Social connections:    Talks on phone: Not on file    Gets together: Not on file    Attends religious service: Not on file    Active  member of club or organization: Not on file    Attends meetings of clubs or organizations: Not on file    Relationship status: Not on file  . Intimate partner violence:    Fear of current or ex partner: Not on file    Emotionally abused: Not on file    Physically abused: Not on file    Forced sexual activity: Not on file  Other Topics Concern  . Not on file  Social History Narrative  . Not on file    Family History  Problem Relation Age of Onset  . Stroke Mother   .  Heart disease Mother   . Heart attack Father   . Heart disease Father   . Heart disease Unknown   . Cancer Sister   . Heart disease Brother     ROS- All systems are reviewed and negative except as per the HPI above  Physical Exam: Vitals:   08/20/18 1146  BP: (!) 100/58  Pulse: 91  Weight: 88.5 kg  Height: _0  (1.626 m)   Wt Readings from Last 3 Encounters:  08/20/18 88.5 kg  08/13/18 91.4 kg  07/04/18 93.9 kg    Labs: Lab Results  Component Value Date   NA 141 08/20/2018   K 3.4 (L) 08/20/2018   CL 99 08/20/2018   CO2 32 08/20/2018   GLUCOSE 125 (H) 08/20/2018   BUN 32 (H) 08/20/2018   CREATININE 1.06 (H) 08/20/2018   CALCIUM 9.3 08/20/2018   MG 1.7 08/12/2018   Lab Results  Component Value Date   INR 2.1 08/19/2018   Lab Results  Component Value Date   LDLCALC 75 03/12/2014     GEN- The patient is well appearing, alert and oriented x 3 today.   Head- normocephalic, atraumatic Eyes-  Sclera clear, conjunctiva pink Ears- hearing intact Oropharynx- clear Neck- supple, no JVP Lymph- no cervical lymphadenopathy Lungs- Clear to ausculation bilaterally, normal work of breathing Heart- irregular rate and rhythm, no murmurs, rubs or gallops, PMI not laterally displaced GI- soft, NT, ND, + BS Extremities- no clubbing, cyanosis, or edema MS- no significant deformity or atrophy Skin- no rash or lesion Psych- euthymic mood, full affect Neuro- strength and sensation are  intact  EKG-afib at 91 bpm, qrs int 78 ms, qtc 474 ms Echo-Study Conclusions  - Left ventricle: The cavity size was normal. Wall thickness was   increased in a pattern of mild LVH. Systolic function was normal.   The estimated ejection fraction was in the range of 55% to 60%.   Wall motion was normal; there were no regional wall motion   abnormalities. The study was not technically sufficient to allow   evaluation of LV diastolic dysfunction due to atrial   fibrillation. - Aortic valve: Mildly calcified annulus. Trileaflet. There was   mild regurgitation. - Mitral valve: Mildly calcified annulus. - Left atrium: The atrium was mildly dilated. - Right ventricle: Systolic function was mildly reduced. - Tricuspid valve: There was moderate regurgitation. - Pulmonary arteries: PA peak pressure: 59 mm Hg (S). - Systemic veins: IVC is dilated with normal respiratory variation.   Estimated CVP 8 mmHg.   Assessment and Plan: 1. Chronic afib Is rate controlled Continue carvedilol 25 mg bid  2. Acute on chronic diastolic  CHF bmet today shows increase in BUN, Creatinine BP soft Pt has lost another 5 lbs since d/c Decrease lasix  20 mg alternating with 40 mg K+ is also low and will add 20 meq K+  Daily Will need recheck in 7-10 days Continue  O2 via [place]  Will have pt f/u with Dr. Fletcher Anon of PA/NP in 3-4 weeks  Geroge Baseman. Dreamer Carillo, [facility] [address] [place], [place] [address] [phone]

## 2018-08-20 NOTE — Patient Instructions (Signed)
Follow up with Dr. Fletcher Anon in 1 month - scheduling will be in contact to schedule this appointment.

## 2018-08-20 NOTE — Patient Outreach (Signed)
[place] [facility]) Care Management  08/20/2018  NAKYA WEYAND [birth year] [id number]     EMMI-HF RED ON EMMI ALERT Day # 5 Date: 08/19/18 Red Alert Reason: " New/worsening problems? Yes  New swelling? Yes  New/worsening SOB? Yes"   Outreach attempt # 1 to patient.  Spoke with patient who voices she is doing well. Reviewed and addressed red alerts with patient. She immediately states that machine did not hear her correctly and she tried to correct her answer but the machine would not allow her to do so. She denies any and all of these issues. She voices that she is doing and feeling just fine. Weight remains stable. She denies any edema. No further RN CM needs or concerns at this time. Patient aware that she will continue to get automated EMMI-HF calls and will receive a call from staff if any of her responses are abnormal. She voiced understanding and was appreciative of follow up call.       Plan: RN CM will close case at this time.    Enzo Montgomery, RN,BSN,CCM [place] Management Telephonic Care Management Coordinator Direct Phone: [phone] Toll Free: [phone] Fax: [phone]

## 2018-08-21 DIAGNOSIS — E119 Type 2 diabetes mellitus without complications: Secondary | ICD-10-CM | POA: Diagnosis not present

## 2018-08-21 DIAGNOSIS — I5033 Acute on chronic diastolic (congestive) heart failure: Secondary | ICD-10-CM | POA: Diagnosis not present

## 2018-08-21 DIAGNOSIS — M109 Gout, unspecified: Secondary | ICD-10-CM | POA: Diagnosis not present

## 2018-08-21 DIAGNOSIS — I2510 Atherosclerotic heart disease of native coronary artery without angina pectoris: Secondary | ICD-10-CM | POA: Diagnosis not present

## 2018-08-21 DIAGNOSIS — I4891 Unspecified atrial fibrillation: Secondary | ICD-10-CM | POA: Diagnosis not present

## 2018-08-21 DIAGNOSIS — I110 Hypertensive heart disease with heart failure: Secondary | ICD-10-CM | POA: Diagnosis not present

## 2018-08-22 ENCOUNTER — Other Ambulatory Visit: Payer: Self-pay

## 2018-08-22 NOTE — Patient Outreach (Signed)
[place] [facility]) Care Management  08/22/2018  Rebekah Stewart 1934-11-03 [id number]    EMMI-HF RED ON EMMI ALERT Day # 7 Date: 08/21/18 Red Alert Reason: "Weight: 187lbs, previous weight: 180 lbs"   Incoming call from patient returning RN CM call. Reviewed and addressed red alerts with patient. She continues to weigh daily and records weight. Patient able to verbalize that machine recorded her weight incorrectly. She voices weight has only fluctuated between 185-187lbs. Her weight has not dropped down to 180 lbs.  Patient aware of when to alert MD of weight changes. She voices she is adhering to fluid and sodium restrictions. She denies any further RN CM needs or concerns at this time. Patient aware that she will continue to get automated post discharge HF calls.     Plan: RN CM will close case at this time as no further interventions needed.   Enzo Montgomery, RN,BSN,CCM [place] Management Telephonic Care Management Coordinator Direct Phone: [phone] Toll Free: [phone] Fax: [phone]

## 2018-08-22 NOTE — Patient Outreach (Signed)
[place] [facility]) Care Management  08/22/2018  Rebekah Stewart 16-Jul-1935 [id number]     EMMI-HF RED ON EMMI ALERT Day # 7 Date: 08/21/18 Red Alert Reason: "Weight: 187lbs, previous weight: 180 lbs"   Outreach attempt #1 patient. No answer at present. RN CM left HIPAA compliant voicemail message along with contact info.     Plan: RN CM will make outreach attempt to patient within 3-4 business days. RN CM will send unsuccessful outreach letter to patient.    Enzo Montgomery, RN,BSN,CCM [place] Management Telephonic Care Management Coordinator Direct Phone: [phone] Toll Free: [phone] Fax: [phone]

## 2018-08-23 ENCOUNTER — Other Ambulatory Visit: Payer: Self-pay | Admitting: Internal Medicine

## 2018-08-25 ENCOUNTER — Other Ambulatory Visit: Payer: Self-pay

## 2018-08-25 DIAGNOSIS — I110 Hypertensive heart disease with heart failure: Secondary | ICD-10-CM | POA: Diagnosis not present

## 2018-08-25 DIAGNOSIS — I2510 Atherosclerotic heart disease of native coronary artery without angina pectoris: Secondary | ICD-10-CM | POA: Diagnosis not present

## 2018-08-25 DIAGNOSIS — I4891 Unspecified atrial fibrillation: Secondary | ICD-10-CM | POA: Diagnosis not present

## 2018-08-25 DIAGNOSIS — I5033 Acute on chronic diastolic (congestive) heart failure: Secondary | ICD-10-CM | POA: Diagnosis not present

## 2018-08-25 DIAGNOSIS — M109 Gout, unspecified: Secondary | ICD-10-CM | POA: Diagnosis not present

## 2018-08-25 DIAGNOSIS — E119 Type 2 diabetes mellitus without complications: Secondary | ICD-10-CM | POA: Diagnosis not present

## 2018-08-25 NOTE — Patient Outreach (Signed)
[place]) Care Management  08/25/2018  ZYLIAH SCHIER [birth year] [id number]     EMMI-HF RED ON EMMI ALERT Day # 10 Date: 08/24/18 Red Alert Reason: "Weight: 186 lbs"   EMMI-HF RED ON EMMI ALERT Day # 9 Date: 08/23/18 Red Alert Reason: "Weight: 190 lbs"    EMMI-HF  RED ON EMMI ALERT Day # 8 Date: 08/22/18 Red Alert Reason: " Any new problems? Yes   New/worsening problems? Yes  New swelling? Yes  Had diarrhea or felt sick to stomach? Yes  Nausea or vomiting? Yes Tired/fatigued? Yes  Fever or chills? Yes  Went to follow up appt? NO  Why they didn't attend follow-up appt? I Missed it"   Outreach attempt # 1 to patient. Spoke with patient who voices she is doing well. Reviewed and addressed red alerts with patient. She immediately reports that the machine did not hear her responses correctly and she knew that a nurse would be calling to follow up with her. She states that her weight has not gone up to 190 lbs. It has been steady and fluctuating only between 185-187 lbs. Weight this morning was 187 lbs and weight on yesterday was 186 lbs. Patient continues to reach and log weight. She denies any edema/swelling. She voices no SOB or any other resp or GI symptoms. She reports that things are going well at present. She denies any further RN needs or concerns at this time. She is aware that she will continue to get automated EMMI-HF post discharge calls.         Plan: RN CM will close case as no further interventions   Enzo Montgomery, RN,BSN,CCM [place] Management Telephonic Care Management Coordinator Direct Phone: [phone] Toll Free: [phone] Fax: [phone]

## 2018-08-26 ENCOUNTER — Ambulatory Visit (INDEPENDENT_AMBULATORY_CARE_PROVIDER_SITE_OTHER): Payer: Medicare Other

## 2018-08-26 ENCOUNTER — Other Ambulatory Visit: Payer: Self-pay

## 2018-08-26 DIAGNOSIS — I4811 Longstanding persistent atrial fibrillation: Secondary | ICD-10-CM | POA: Diagnosis not present

## 2018-08-26 DIAGNOSIS — I4891 Unspecified atrial fibrillation: Secondary | ICD-10-CM | POA: Diagnosis not present

## 2018-08-26 DIAGNOSIS — I110 Hypertensive heart disease with heart failure: Secondary | ICD-10-CM | POA: Diagnosis not present

## 2018-08-26 DIAGNOSIS — M109 Gout, unspecified: Secondary | ICD-10-CM | POA: Diagnosis not present

## 2018-08-26 DIAGNOSIS — I2510 Atherosclerotic heart disease of native coronary artery without angina pectoris: Secondary | ICD-10-CM | POA: Diagnosis not present

## 2018-08-26 DIAGNOSIS — I5033 Acute on chronic diastolic (congestive) heart failure: Secondary | ICD-10-CM | POA: Diagnosis not present

## 2018-08-26 DIAGNOSIS — Z7901 Long term (current) use of anticoagulants: Secondary | ICD-10-CM | POA: Diagnosis not present

## 2018-08-26 DIAGNOSIS — E119 Type 2 diabetes mellitus without complications: Secondary | ICD-10-CM | POA: Diagnosis not present

## 2018-08-26 LAB — POCT INR: INR: 2.4 (ref 2.0–3.0)

## 2018-08-26 NOTE — Patient Outreach (Signed)
[place] [facility]) Care Management  08/26/2018  TAYLIN MANS 09/01/1935 [id number]     EMMI-HF RED ON EMMI ALERT Day # 11 Date: 08/25/18 Red Alert Reason: "Weight? 188 lbs  New/worsening problems?Yes   New/worsening SOB? Yes"   Red on EMMI dashboard received. No outreach call warranted to patient at this time. Patient reported error in recording of responses. RN CM addressed issue on previous call.       Plan: RN CM will close case at this time.   Enzo Montgomery, RN,BSN,CCM [place] Management Telephonic Care Management Coordinator Direct Phone: [phone] Toll Free: [phone] Fax: [phone]

## 2018-08-27 DIAGNOSIS — I2510 Atherosclerotic heart disease of native coronary artery without angina pectoris: Secondary | ICD-10-CM | POA: Diagnosis not present

## 2018-08-27 DIAGNOSIS — M109 Gout, unspecified: Secondary | ICD-10-CM | POA: Diagnosis not present

## 2018-08-27 DIAGNOSIS — E119 Type 2 diabetes mellitus without complications: Secondary | ICD-10-CM | POA: Diagnosis not present

## 2018-08-27 DIAGNOSIS — I4891 Unspecified atrial fibrillation: Secondary | ICD-10-CM | POA: Diagnosis not present

## 2018-08-27 DIAGNOSIS — I5033 Acute on chronic diastolic (congestive) heart failure: Secondary | ICD-10-CM | POA: Diagnosis not present

## 2018-08-27 DIAGNOSIS — I110 Hypertensive heart disease with heart failure: Secondary | ICD-10-CM | POA: Diagnosis not present

## 2018-09-01 ENCOUNTER — Other Ambulatory Visit: Payer: Self-pay

## 2018-09-01 ENCOUNTER — Telehealth: Payer: Self-pay | Admitting: Cardiovascular Disease

## 2018-09-01 DIAGNOSIS — I4891 Unspecified atrial fibrillation: Secondary | ICD-10-CM | POA: Diagnosis not present

## 2018-09-01 DIAGNOSIS — I5033 Acute on chronic diastolic (congestive) heart failure: Secondary | ICD-10-CM | POA: Diagnosis not present

## 2018-09-01 DIAGNOSIS — I2510 Atherosclerotic heart disease of native coronary artery without angina pectoris: Secondary | ICD-10-CM | POA: Diagnosis not present

## 2018-09-01 DIAGNOSIS — E119 Type 2 diabetes mellitus without complications: Secondary | ICD-10-CM | POA: Diagnosis not present

## 2018-09-01 DIAGNOSIS — M109 Gout, unspecified: Secondary | ICD-10-CM | POA: Diagnosis not present

## 2018-09-01 DIAGNOSIS — I110 Hypertensive heart disease with heart failure: Secondary | ICD-10-CM | POA: Diagnosis not present

## 2018-09-01 NOTE — Telephone Encounter (Signed)
Patient called back, stating that she is feeling okay now, but she still has issues with nausea. She states that her blood pressure jumps around a lot, from 153/92 bp to 117/70, and even when her PT checked it was 120/80. Patient denies CP, SOB, Swelling, no other issues than the nausea and moving BP.   Patient was advised to keep a BP record until appointment with PA on 09/18/18, and to bring monitor with her to that appointment to check both automatic BP and manuel BP. Patient verbalized understanding to call if any other issues.

## 2018-09-01 NOTE — Telephone Encounter (Signed)
New message    Per Clair Gulling the patient reported that had nausea on today and yesterday while she was exercising.  Today when doing bills prior to taking morning medicine she felt mildly nauseated and she reported 150/92 b/p. B/p was 120/80 on today when exercising she became nauseated and had mild afib.  Please contact patient with any concerns.  No significant change in daily weight per Clair Gulling.

## 2018-09-01 NOTE — Telephone Encounter (Signed)
Lilia Argue PT from [facility]. He states that the patient while doing crunches did mention some mild nausea, and then noticed it today while doing her bills, and then again when doing crunches today. He advised her to stop the crunches, patient advised him of her BP it was 150/90, but when he arrived and checked it was 120/80. Patient did mention some afib today (normal for per patient) no weight gain, no sob per PT. Patient is going to her new PCP on Thursday this week. I contact patient to see how she was feeling now, she did not answer and I left a message for her to call back to check in. Left call back number.

## 2018-09-01 NOTE — Telephone Encounter (Signed)
The home health nurse called to see if it was okay to do the patient's lab work tomorrow since the patient was out of town. She has been informed that this was okay.

## 2018-09-01 NOTE — Telephone Encounter (Signed)
Patient out of town with family [place] wants to know if todays labs and inr can be done tomorrow.  Please call

## 2018-09-01 NOTE — Patient Outreach (Signed)
[facility]) Care Management  09/01/2018  Rebekah Stewart October 29, 1934 [id number]   EMMI-HF RED ON EMMI ALERT Day #16 Date: 08/30/18 Red Alert Reason: " Weighed themselves today? No   New/worsening problems? Yes"   Incoming call from patient returning RN CM call. Patient voices that she is doing better than she was earlier. She has been staying with her niece for a few days as her son had a lot of company over the holidays. Patient shares that she became nauseated on yesterday and again this morning while doing some of her PT exercises. She denies any vomiting but does voice some dry heaving. She has been having a good appetite and no recent changes in diet. Patient talked with HHPT regarding this and has been advised to discontinue doing that activity that was causing her to get nauseated. She reports that symptoms have resolved and she is back to feeling better. She states that her BP and other vitals were all normal during these two episodes. She is aware that if symptoms persist and/or worsen she needs to seek medical attention. She denies any further RN CM needs or concerns at this time. Patient aware that she will continue to get automated EMMI-HF post discharge calls and will receive a phone call from nurse for any abnormal responses. She voiced understanding and was appreciative of follow up call.   Plan: RN CM will close case as no further interventions needed at this time.    Enzo Montgomery, RN,BSN,CCM [place] Management Telephonic Care Management Coordinator Direct Phone: [phone] Toll Free: [phone] Fax: [phone]

## 2018-09-01 NOTE — Patient Outreach (Signed)
[place] [facility]) Care Management  09/01/2018  HILIARY OSORTO 1935-07-03 [id number]     EMMI-HF RED ON EMMI ALERT Day #16 Date: 08/30/18 Red Alert Reason: " Weighed themselves today? No   New/worsening problems? Yes"   Outreach attempt #1 to patient. No answer at present. RN CM left HIPAA compliant voicemail message along with contact info.           Plan: RN CM will make outreach attempt to patient within 3-4 business days. RN CM will send unsuccessful outreach letter to patient.    Enzo Montgomery, RN,BSN,CCM [place] Management Telephonic Care Management Coordinator Direct Phone: [phone] Toll Free: [phone] Fax: [phone]  30/19

## 2018-09-02 ENCOUNTER — Ambulatory Visit: Payer: Self-pay

## 2018-09-02 ENCOUNTER — Telehealth: Payer: Self-pay | Admitting: Cardiovascular Disease

## 2018-09-02 ENCOUNTER — Ambulatory Visit: Payer: Self-pay | Admitting: Pharmacist Clinician (PhC)/ Clinical Pharmacy Specialist

## 2018-09-02 DIAGNOSIS — I5033 Acute on chronic diastolic (congestive) heart failure: Secondary | ICD-10-CM | POA: Diagnosis not present

## 2018-09-02 DIAGNOSIS — I110 Hypertensive heart disease with heart failure: Secondary | ICD-10-CM | POA: Diagnosis not present

## 2018-09-02 DIAGNOSIS — I4891 Unspecified atrial fibrillation: Secondary | ICD-10-CM | POA: Diagnosis not present

## 2018-09-02 DIAGNOSIS — I5022 Chronic systolic (congestive) heart failure: Secondary | ICD-10-CM | POA: Diagnosis not present

## 2018-09-02 DIAGNOSIS — E119 Type 2 diabetes mellitus without complications: Secondary | ICD-10-CM | POA: Diagnosis not present

## 2018-09-02 DIAGNOSIS — I4811 Longstanding persistent atrial fibrillation: Secondary | ICD-10-CM

## 2018-09-02 DIAGNOSIS — I2510 Atherosclerotic heart disease of native coronary artery without angina pectoris: Secondary | ICD-10-CM | POA: Diagnosis not present

## 2018-09-02 DIAGNOSIS — Z7901 Long term (current) use of anticoagulants: Secondary | ICD-10-CM

## 2018-09-02 DIAGNOSIS — M109 Gout, unspecified: Secondary | ICD-10-CM | POA: Diagnosis not present

## 2018-09-02 NOTE — Telephone Encounter (Signed)
Rebekah Stewart has been made aware that the [place] level is being followed by her PCP.

## 2018-09-02 NOTE — Telephone Encounter (Signed)
Calling with INR results. 2.5 Please address of any fluid restrictions.

## 2018-09-03 ENCOUNTER — Other Ambulatory Visit: Payer: Self-pay

## 2018-09-03 DIAGNOSIS — I2510 Atherosclerotic heart disease of native coronary artery without angina pectoris: Secondary | ICD-10-CM | POA: Diagnosis not present

## 2018-09-03 DIAGNOSIS — I110 Hypertensive heart disease with heart failure: Secondary | ICD-10-CM | POA: Diagnosis not present

## 2018-09-03 DIAGNOSIS — E119 Type 2 diabetes mellitus without complications: Secondary | ICD-10-CM | POA: Diagnosis not present

## 2018-09-03 DIAGNOSIS — I5033 Acute on chronic diastolic (congestive) heart failure: Secondary | ICD-10-CM | POA: Diagnosis not present

## 2018-09-03 DIAGNOSIS — M109 Gout, unspecified: Secondary | ICD-10-CM | POA: Diagnosis not present

## 2018-09-03 DIAGNOSIS — I4891 Unspecified atrial fibrillation: Secondary | ICD-10-CM | POA: Diagnosis not present

## 2018-09-03 NOTE — Patient Outreach (Signed)
[facility]) Care Management  09/03/2018  Rebekah Stewart October 06, 1934 [id number]     EMMI-HF RED ON EMMI ALERT Day # 19 Date: 09/02/18 Red Alert Reason: " Any new problems? Yes  New/worsening problems?Yes  Tired/fatigued? Yes"   Outreach attempt # 1 to patient.  Spoke with patient who voices she is doing well. She states that yesterday was a very busy day for her as she has an MD appt as well as [place] came out to see her. Reviewed and addressed red alerts with patient. She denies any of those issues. She states that the machine keeps hearing her responses incorrectly. She voices that she has had no further nausea episodes since speaking with RN CM earlier this week. She denies any issues or concerns at this time. Patient aware that she will continue to get automated post discharge EMMI-HF calls.        Plan: RN CM will close case at this tim as no further interventions needed.    Enzo Montgomery, RN,BSN,CCM [place] [facility] Telephonic Care Management Coordinator Direct Phone: [phone] Free: [phone]: [phone]

## 2018-09-04 ENCOUNTER — Encounter: Payer: Self-pay | Admitting: Family Medicine

## 2018-09-04 ENCOUNTER — Ambulatory Visit (INDEPENDENT_AMBULATORY_CARE_PROVIDER_SITE_OTHER): Payer: Medicare Other | Admitting: Family Medicine

## 2018-09-04 VITALS — BP 90/50 | HR 86 | Temp 98.0°F | Ht 64.0 in | Wt 193.0 lb

## 2018-09-04 DIAGNOSIS — I10 Essential (primary) hypertension: Secondary | ICD-10-CM | POA: Diagnosis not present

## 2018-09-04 DIAGNOSIS — I8393 Asymptomatic varicose veins of bilateral lower extremities: Secondary | ICD-10-CM

## 2018-09-04 DIAGNOSIS — Z8673 Personal history of transient ischemic attack (TIA), and cerebral infarction without residual deficits: Secondary | ICD-10-CM | POA: Diagnosis not present

## 2018-09-04 DIAGNOSIS — Z8739 Personal history of other diseases of the musculoskeletal system and connective tissue: Secondary | ICD-10-CM | POA: Diagnosis not present

## 2018-09-04 DIAGNOSIS — E119 Type 2 diabetes mellitus without complications: Secondary | ICD-10-CM | POA: Diagnosis not present

## 2018-09-04 DIAGNOSIS — I482 Chronic atrial fibrillation, unspecified: Secondary | ICD-10-CM | POA: Diagnosis not present

## 2018-09-04 DIAGNOSIS — Z7901 Long term (current) use of anticoagulants: Secondary | ICD-10-CM | POA: Diagnosis not present

## 2018-09-04 DIAGNOSIS — Z7689 Persons encountering health services in other specified circumstances: Secondary | ICD-10-CM | POA: Diagnosis not present

## 2018-09-04 DIAGNOSIS — I2510 Atherosclerotic heart disease of native coronary artery without angina pectoris: Secondary | ICD-10-CM

## 2018-09-04 DIAGNOSIS — I5032 Chronic diastolic (congestive) heart failure: Secondary | ICD-10-CM | POA: Diagnosis not present

## 2018-09-04 NOTE — Patient Instructions (Signed)
Heart Failure Heart failure is a condition in which the heart has trouble pumping blood because it has become weak or stiff. This means that the heart does not pump blood efficiently for the body to work well. For some people with heart failure, fluid may back up into the lungs and there may be swelling (edema) in the lower legs. Heart failure is usually a long-term (chronic) condition. It is important for you to take good care of yourself and follow the treatment plan from your health care provider. What are the causes? This condition is caused by some health problems, including:  High blood pressure (hypertension). Hypertension causes the heart muscle to work harder than normal. High blood pressure eventually causes the heart to become stiff and weak.  Coronary artery disease (CAD). CAD is the buildup of cholesterol and fat (plaques) in the arteries of the heart.  Heart attack (myocardial infarction). Injured tissue, which is caused by the heart attack, does not contract as well and the heart's ability to pump blood is weakened.  Abnormal heart valves. When the heart valves do not open and close properly, the heart muscle must pump harder to keep the blood flowing.  Heart muscle disease (cardiomyopathy or myocarditis). Heart muscle disease is damage to the heart muscle from a variety of causes, such as drug or alcohol abuse, infections, or unknown causes. These can increase the risk of heart failure.  Lung disease. When the lungs do not work properly, the heart must work harder.  What increases the risk? Risk of heart failure increases as a person ages. This condition is also more likely to develop in people who:  Are overweight.  Are female.  Smoke or chew tobacco.  Abuse alcohol or illegal drugs.  Have taken medicines that can damage the heart, such as chemotherapy drugs.  Have diabetes. ? High blood sugar (glucose) is associated with high fat (lipid) levels in the blood. ? Diabetes  can also damage tiny blood vessels that carry nutrients to the heart muscle.  Have abnormal heart rhythms.  Have thyroid problems.  Have low blood counts (anemia).  What are the signs or symptoms? Symptoms of this condition include:  Shortness of breath with activity, such as when climbing stairs.  Persistent cough.  Swelling of the feet, ankles, legs, or abdomen.  Unexplained weight gain.  Difficulty breathing when lying flat (orthopnea).  Waking from sleep because of the need to sit up and get more air.  Rapid heartbeat.  Fatigue and loss of energy.  Feeling light-headed, dizzy, or close to fainting.  Loss of appetite.  Nausea.  Increased urination during the night (nocturia).  Confusion.  How is this diagnosed? This condition is diagnosed based on:  Medical history, symptoms, and a physical exam.  Diagnostic tests, which may include: ? Echocardiogram. ? Electrocardiogram (ECG). ? Chest X-ray. ? Blood tests. ? Exercise stress test. ? Radionuclide scans. ? Cardiac catheterization and angiogram.  How is this treated? Treatment for this condition is aimed at managing the symptoms of heart failure. Medicines, behavioral changes, or other treatments may be necessary to treat heart failure. Medicines These may include:  Angiotensin-converting enzyme (ACE) inhibitors. This type of medicine blocks the effects of a blood protein called angiotensin-converting enzyme. ACE inhibitors relax (dilate) the blood vessels and help to lower blood pressure.  Angiotensin receptor blockers (ARBs). This type of medicine blocks the actions of a blood protein called angiotensin. ARBs dilate the blood vessels and help to lower blood pressure.  Water  pills (diuretics). Diuretics cause the kidneys to remove salt and water from the blood. The extra fluid is removed through urination, leaving a lower volume of blood that the heart has to pump.  Beta blockers. These improve heart  muscle strength and they prevent the heart from beating too quickly.  Digoxin. This increases the force of the heartbeat.  Healthy behavior changes These may include:  Reaching and maintaining a healthy weight.  Stopping smoking or chewing tobacco.  Eating heart-healthy foods.  Limiting or avoiding alcohol.  Stopping use of street drugs (illegal drugs).  Physical activity.  Other treatments These may include:  Surgery to open blocked coronary arteries or repair damaged heart valves.  Placement of a biventricular pacemaker to improve heart muscle function (cardiac resynchronization therapy). This device paces both the right ventricle and left ventricle.  Placement of a device to treat serious abnormal heart rhythms (implantable cardioverter defibrillator, or ICD).  Placement of a device to improve the pumping ability of the heart (left ventricular assist device, or LVAD).  Heart transplant. This can cure heart failure, and it is considered for certain patients who do not improve with other therapies.  Follow these instructions at home: Medicines  Take over-the-counter and prescription medicines only as told by your health care provider. Medicines are important in reducing the workload of your heart, slowing the progression of heart failure, and improving your symptoms. ? Do not stop taking your medicine unless your health care provider told you to do that. ? Do not skip any dose of medicine. ? Refill your prescriptions before you run out of medicine. You need your medicines every day. Eating and drinking   Eat heart-healthy foods. Talk with a dietitian to make an eating plan that is right for you. ? Choose foods that contain no trans fat and are low in saturated fat and cholesterol. Healthy choices include fresh or frozen fruits and vegetables, fish, lean meats, legumes, fat-free or low-fat dairy products, and whole-grain or high-fiber foods. ? Limit salt (sodium) if  directed by your health care provider. Sodium restriction may reduce symptoms of heart failure. Ask a dietitian to recommend heart-healthy seasonings. ? Use healthy cooking methods instead of frying. Healthy methods include roasting, grilling, broiling, baking, poaching, steaming, and stir-frying.  Limit your fluid intake if directed by your health care provider. Fluid restriction may reduce symptoms of heart failure. Lifestyle  Stop smoking or using chewing tobacco. Nicotine and tobacco can damage your heart and your blood vessels. Do not use nicotine gum or patches before talking to your health care provider.  Limit alcohol intake to no more than 1 drink per day for non-pregnant women and 2 drinks per day for men. One drink equals 12 oz of beer, 5 oz of wine, or 1 oz of hard liquor. ? Drinking more than that is harmful to your heart. Tell your health care provider if you drink alcohol several times a week. ? Talk with your health care provider about whether any level of alcohol use is safe for you. ? If your heart has already been damaged by alcohol or you have severe heart failure, drinking alcohol should be stopped completely.  Stop use of illegal drugs.  Lose weight if directed by your health care provider. Weight loss may reduce symptoms of heart failure.  Do moderate physical activity if directed by your health care provider. People who are elderly and people with severe heart failure should consult with a health care provider for physical activity recommendations.  Monitor important information  Weigh yourself every day. Keeping track of your weight daily helps you to notice excess fluid sooner. ? Weigh yourself every morning after you urinate and before you eat breakfast. ? Wear the same amount of clothing each time you weigh yourself. ? Record your daily weight. Provide your health care provider with your weight record.  Monitor and record your blood pressure as told by your health  care provider.  Check your pulse as told by your health care provider. Dealing with extreme temperatures  If the weather is extremely hot: ? Avoid vigorous physical activity. ? Use air conditioning or fans or seek a cooler location. ? Avoid caffeine and alcohol. ? Wear loose-fitting, lightweight, and light-colored clothing.  If the weather is extremely cold: ? Avoid vigorous physical activity. ? Layer your clothes. ? Wear mittens or gloves, a hat, and a scarf when you go outside. ? Avoid alcohol. General instructions  Manage other health conditions such as hypertension, diabetes, thyroid disease, or abnormal heart rhythms as told by your health care provider.  Learn to manage stress. If you need help to do this, ask your health care provider.  Plan rest periods when fatigued.  Get ongoing education and support as needed.  Participate in or seek rehabilitation as needed to maintain or improve independence and quality of life.  Stay up to date with immunizations. Keeping current on pneumococcal and influenza immunizations is especially important to prevent respiratory infections.  Keep all follow-up visits as told by your health care provider. This is important. Contact a health care provider if:  You have a rapid weight gain.  You have increasing shortness of breath that is unusual for you.  You are unable to participate in your usual physical activities.  You tire easily.  You cough more than normal, especially with physical activity.  You have any swelling or more swelling in areas such as your hands, feet, ankles, or abdomen.  You are unable to sleep because it is hard to breathe.  You feel like your heart is beating quickly (palpitations).  You become dizzy or light-headed when you stand up. Get help right away if:  You have difficulty breathing.  You notice or your family notices a change in your awareness, such as having trouble staying awake or having  difficulty with concentration.  You have pain or discomfort in your chest.  You have an episode of fainting (syncope). This information is not intended to replace advice given to you by your health care provider. Make sure you discuss any questions you have with your health care provider. Document Released: 09/17/2005 Document Revised: 05/22/2016 Document Reviewed: 04/11/2016 Elsevier Interactive Patient Education  2018 [place] With Heart Failure  Heart failure is a long-term (chronic) condition in which the heart cannot pump enough blood through the body. When this happens, parts of the body do not get the blood and oxygen they need. There is no cure for heart failure at this time, so it is important for you to take good care of yourself and follow the treatment plan set by your health care provider. If you are living with heart failure, there are ways to help you manage the disease. Follow these instructions at home: Living with heart failure requires you to make changes in your life. Your health care team will teach you about the changes you need to make in order to relieve your symptoms and lower your risk of going to the hospital. Follow the  treatment plan as set by your health care provider. Medicines Medicines are important in reducing your heart's workload, slowing the progression of heart failure, and improving your symptoms.  Take over-the-counter and prescription medicines only as told by your health care provider.  Do not stop taking your medicine unless your health care provider tells you to do that.  Do not skip any dose of your medicine.  Refill prescriptions before you run out of medicine. You need your medicines every day.  Eating and drinking  Eat heart-healthy foods. Talk with a dietitian to make an eating plan that is right for you. ? If directed by your health care provider: ? Limit salt (sodium). Lowering your sodium intake may reduce symptoms of  heart failure. Ask a dietitian to recommend heart-healthy seasonings. ? Limit your fluid intake. Fluid restriction may reduce symptoms of heart failure. ? Use low-fat cooking methods instead of frying. Low-fat methods include roasting, grilling, broiling, baking, poaching, steaming, and stir-frying. ? Choose foods that contain no trans fat and are low in saturated fat and cholesterol. Healthy choices include fresh or frozen fruits and vegetables, fish, lean meats, legumes, fat-free or low-fat dairy products, and whole-grain or high-fiber foods.  Limit alcohol intake to no more than 1 drink a day for nonpregnant women and 2 drinks a day for men. One drink equals 12 oz of beer, 5 oz of wine, or 1 oz of hard liquor. ? Drinking more than that is harmful to your heart. Tell your health care provider if you drink alcohol several times a week. ? Talk with your health care provider about whether any level of alcohol use is safe for you. Activity  Ask your health care provider about attending cardiac rehabilitation. These programs include aerobic physical activity, which provides many benefits for your heart.  If no cardiac rehabilitation program is available, ask your health care provider what aerobic exercises are safe for you to do. Lifestyle Make the lifestyle changes recommended by your health care provider. In general:  Lose weight if your health care provider tells you to do that. Weight loss may reduce symptoms of heart failure.  Do not use any products that contain nicotine or tobacco, such as cigarettes or e-cigarettes. If you need help quitting, ask your health care provider.  Do not use street (illegal) drugs.  Return to your normal activities as told by your health care provider. Ask your health care provider what activities are safe for you.  General instructions  Make sure you weigh yourself every day to track your weight. Rapid weight gain may indicate an increase in fluid in your  body and may increase the workload of your heart. ? Weigh yourself every morning. Do this after you urinate but before you eat breakfast. ? Wear the same type of clothing, without shoes, each time you weigh yourself. ? Weigh yourself on the same scale and in the same spot each time.  Living with chronic heart failure often leads to emotions such as fear, stress, anxiety, and depression. If you feel any of these emotions and need help coping, contact your health care provider. Other ways to get help include: ? Talking to friends and family members about your condition. They can give you support and guidance. Explain your symptoms to them and, if comfortable, invite them to attend appointments or rehabilitation with you. ? Joining a support group for people with chronic heart failure. Talking with other people who have the same symptoms may give you new ways  of coping with your disease and your emotions.  Stay up to date with your shots (vaccines). Staying current on pneumococcal and influenza vaccines is especially important in preventing germs from attacking your airways (respiratory infections).  Keep all follow-up visits as told by your health care provider. This is important. How to recognize changes in your condition You and your family members need to know what changes to watch for in your condition. Watch for the following changes and report them to your health care provider:  Sudden weight gain. Ask your health care provider what amount of weight gain to report.  Shortness of breath: ? Feeling short of breath while at rest, with no exercise or activity that required great effort. ? Feeling breathless with activity.  Swelling of your lower legs or ankles.  Difficulty sleeping: ? You wake up feeling short of breath. ? You have to use more pillows to raise your head in order to sleep.  Frequent, dry, hacking cough.  Loss of appetite.  Feeling more tired all the time.  Depression  or feelings of sadness or hopelessness.  Bloating in the stomach.  Where to find more information  Local support groups. Ask your health care provider about groups near you.  The American Heart Association: www.heart.org Contact a health care provider if:  You have a rapid weight gain.  You have increasing shortness of breath that is unusual for you.  You are unable to participate in your usual physical activities.  You tire easily.  You cough more than normal, especially with physical activity.  You have any swelling or more swelling in areas such as your hands, feet, ankles, or abdomen.  You feel like your heart is beating quickly (palpitations).  You become dizzy or light-headed when you stand up. Get help right away if:  You have difficulty breathing.  You notice or your family notices a change in your awareness, such as having trouble staying awake or having difficulty with concentration.  You have pain or discomfort in your chest.  You have an episode of fainting (syncope). Summary  There is no cure for heart failure, so it is important for you to take good care of yourself and follow the treatment plan set by your health care provider.  Medicines are important in reducing your heart's workload, slowing the progression of heart failure, and improving your symptoms.  Living with chronic heart failure often leads to emotions such as fear, stress, anxiety, and depression. If you are feeling any of these emotions and need help coping, contact your health care provider. This information is not intended to replace advice given to you by your health care provider. Make sure you discuss any questions you have with your health care provider. Document Released: 01/30/2017 Document Revised: 01/30/2017 Document Reviewed: 01/30/2017 Elsevier Interactive Patient Education  2018 [place] are compounds that affect the level of uric acid in your body.  A low-purine diet is a diet that is low in purines. Eating a low-purine diet can prevent the level of uric acid in your body from getting too high and causing gout or kidney stones or both. What do I need to know about this diet?  Choose low-purine foods. Examples of low-purine foods are listed in the next section.  Drink plenty of fluids, especially water. Fluids can help remove uric acid from your body. Try to drink 8-16 cups (1.9-3.8 L) a day.  Limit foods high in fat, especially saturated fat, as fat makes  it harder for the body to get rid of uric acid. Foods high in saturated fat include pizza, cheese, ice cream, whole milk, fried foods, and gravies. Choose foods that are lower in fat and lean sources of protein. Use olive oil when cooking as it contains healthy fats that are not high in saturated fat.  Limit alcohol. Alcohol interferes with the elimination of uric acid from your body. If you are having a gout attack, avoid all alcohol.  Keep in mind that different people's bodies react differently to different foods. You will probably learn over time which foods do or do not affect you. If you discover that a food tends to cause your gout to flare up, avoid eating that food. You can more freely enjoy foods that do not cause problems. If you have any questions about a food item, talk to your dietitian or health care provider. Which foods are low, moderate, and high in purines? The following is a list of foods that are low, moderate, and high in purines. You can eat any amount of the foods that are low in purines. You may be able to have small amounts of foods that are moderate in purines. Ask your health care provider how much of a food moderate in purines you can have. Avoid foods high in purines. Grains  Foods low in purines: Enriched white bread, pasta, rice, cake, cornbread, popcorn.  Foods moderate in purines: Whole-grain breads and cereals, wheat germ, bran, oatmeal. Uncooked oatmeal.  Dry wheat bran or wheat germ.  Foods high in purines: Pancakes, [place] toast, biscuits, muffins. Vegetables  Foods low in purines: All vegetables, except those that are moderate in purines.  Foods moderate in purines: Asparagus, cauliflower, spinach, mushrooms, green peas. Fruits  All fruits are low in purines. Meats and other Protein Foods  Foods low in purines: Eggs, nuts, peanut butter.  Foods moderate in purines: 80-90% lean beef, lamb, veal, pork, poultry, fish, eggs, peanut butter, nuts. Crab, lobster, oysters, and shrimp. Cooked dried beans, peas, and lentils.  Foods high in purines: Anchovies, sardines, herring, mussels, tuna, codfish, scallops, trout, and haddock. [place] Salines. Organ meats (such as liver or kidney). [place]. Game meat. Goose. Sweetbreads. Dairy  All dairy foods are low in purines. Low-fat and fat-free dairy products are best because they are low in saturated fat. Beverages  Drinks low in purines: Water, carbonated beverages, tea, coffee, cocoa.  Drinks moderate in purines: Soft drinks and other drinks sweetened with high-fructose corn syrup. Juices. To find whether a food or drink is sweetened with high-fructose corn syrup, look at the ingredients list.  Drinks high in purines: Alcoholic beverages (such as beer). Condiments  Foods low in purines: Salt, herbs, olives, pickles, relishes, vinegar.  Foods moderate in purines: Butter, margarine, oils, mayonnaise. Fats and Oils  Foods low in purines: All types, except gravies and sauces made with meat.  Foods high in purines: Gravies and sauces made with meat. Other Foods  Foods low in purines: Sugars, sweets, gelatin. Cake. Soups made without meat.  Foods moderate in purines: Meat-based or fish-based soups, broths, or bouillons. Foods and drinks sweetened with high-fructose corn syrup.  Foods high in purines: High-fat desserts (such as ice cream, cookies, cakes, pies, doughnuts, and chocolate). Contact your  dietitian for more information on foods that are not listed here. This information is not intended to replace advice given to you by your health care provider. Make sure you discuss any questions you have with your health care provider. Document  Released: 01/12/2011 Document Revised: 02/23/2016 Document Reviewed: 08/24/2013 Elsevier Interactive Patient Education  2017 [place] for Eating Away From Home If You Have Diabetes Controlling your level of blood glucose, also known as blood sugar, can be challenging. It can be even more difficult when you do not prepare your own meals. The following tips can help you manage your diabetes when you eat away from home. Planning ahead Plan ahead if you know you will be eating away from home:  Ask your health care provider how to time meals and medicine if you are taking insulin.  Make a list of restaurants near you that offer healthy choices. If they have a carry-out menu, take it home and plan what you will order ahead of time.  Look up the restaurant you want to eat at online. Many chain and fast-food restaurants list nutritional information online. Use this information to choose the healthiest options and to calculate how many carbohydrates will be in your meal.  Use a carbohydrate-counting book or mobile app to look up the carbohydrate content and serving size of the foods you want to eat.  Become familiar with serving sizes and learn to recognize how many servings are in a portion. This will allow you to estimate how many carbohydrates you can eat.  Free foods A "free food" is any food or drink that has less than 5 g of carbohydrates per serving. Free foods include:  Many vegetables.  Hard boiled eggs.  Nuts or seeds.  Olives.  Cheeses.  Meats.  These types of foods make good appetizer choices and are often available at salad bars. Lemon juice, vinegar, or a low-calorie salad dressing of fewer than 20 calories per serving can  be used as a "free" salad dressing. Choices to reduce carbohydrates  Substitute nonfat sweetened yogurt with a sugar-free yogurt. Yogurt made from soy milk may also be used, but you will still want a sugar-free or plain option to choose a lower carbohydrate amount.  Ask your server to take away the bread basket or chips from your table.  Order fresh fruit. A salad bar often offers fresh fruit choices. Avoid canned fruit because it is usually packed in sugar or syrup.  Order a salad, and eat it without dressing. Or, create a "free" salad dressing.  Ask for substitutions. For example, instead of [place] fries, request an order of a vegetable such as salad, green beans, or broccoli. Other tips  If you take insulin, take the insulin once your food arrives to your table. This will ensure your insulin and food are timed correctly.  Ask your server about the portion size before your order, and ask for a take-out box if the portion has more servings than you should have. When your food comes, leave the amount you should have on the plate, and put the rest in the take-out box.  Consider splitting an entree with someone and ordering a side salad. This information is not intended to replace advice given to you by your health care provider. Make sure you discuss any questions you have with your health care provider. Document Released: 09/17/2005 Document Revised: 02/23/2016 Document Reviewed: 12/15/2013 Elsevier Interactive Patient Education  [employer].

## 2018-09-04 NOTE — Progress Notes (Signed)
Patient presents to clinic today to follow-up on chronic conditions and establish care.  Patient is accompanied by her daughter-in-law.  SUBJECTIVE: PMH: Pt is an 83 yo female with pmh sig for arthritis, diabetes, diverticulitis, A. fib, heart disease, HTN, HLD, varicose veins, CVA, and urinary incontinence.  t was previously seen by Dr. Nelda Bucks in [place].  CHF: -followed by Cardiology, Dr, Fletcher Anon -endorses recent hospitalization  11/9-11/12/19 for CHF exacerbation -d/c on 2L O2 for continued SOB despite diuresis. -taking lasix 20 then 40 qod, benazapril 20 mg, Coreg 25 -taking imdur 60 mg daily -was eating fast food prior to hospitalization -monitoring weight at home -has questions regarding fluid restriction amounts and sodium intake.  Afib: -on coumadin 4 mg daily -followed by Cardiology.  Referred to afib clinic -dx'd in 2010 [facility] nurse checks INR weekly  Gout: -had a flair in hospital 2/2 diuresis -on allopurinol  DM II: -dx'd in 2005 -Taking metformin 500 mg TID -checking fsbs since d/c from hospital.  fsbs upper 90s-110s -Last eye exam and foot exam October 2019 -Monitoring carb intake  Varicose veins bilateral lower extremities: -Endorses venous stasis ulcers, healing -Was using steroid cream   Allergies: Iodine-cannot recall what happens Penicillin-lip edema Bee venom Plastic tape  Past surgical history: Tonsillectomy at age 71  Social history: Patient is a widow.  She is retired.  Patient has 3 children.  Patient currently living with her son and daughter-in-law as well as her niece here in [place].  Patient was living in [place].  Patient denies alcohol, tobacco, drug use.  Health Maintenance: Dermatologist-Dr. Rolm Bookbinder Podiatrist-Dr. Cannon Kettle [facility] --Dr. Darleen Crocker and Dr. Tempie Hoist Gastroenterology--Dr. Misenheimer Immunizations --influenza vaccine 2018 Colonoscopy --? Mammogram --2017   Past Medical  History:  Diagnosis Date  . Arthritis of back    lumbar  . Atrial fibrillation ([facility]) 2010  . Atrial fibrillation ([facility])   . Atrial fibrillation ([facility])   . Chronic diastolic heart failure ([facility])   . Colonic polyp   . Compression fracture of thoracic vertebra (HCC)   . Congestive heart failure, unspecified   . Coronary artery disease    mild non obstructive disease per cardiac cath in 2010  . Degeneration of lumbar or lumbosacral intervertebral disc   . Diverticulosis   . DM2 (diabetes mellitus, type 2) ([facility])   . Hemorrhoids   . HLD (hyperlipidemia)   . HTN (hypertension)    diagnosed when she was in her 68s. Refractory. No RAS by [place] in 2010  . Long-term (current) use of anticoagulants   . Lumbosacral spondylosis without myelopathy   . Osteoarthrosis, unspecified whether generalized or localized, unspecified site   . Pain in thoracic spine   . Swelling of limb   . UTI (lower urinary tract infection)   . Varicose veins of lower extremities with other complications   . Vitamin D deficiency     Past Surgical History:  Procedure Laterality Date  . ADENOIDECTOMY    . CARDIAC CATHETERIZATION  2010   At [place]. Mild nonobstructive CAD  . CORONARY ANGIOPLASTY    . EYE SURGERY    . TONSILLECTOMY      Current Outpatient Medications on File Prior to Visit  Medication Sig Dispense Refill  . allopurinol (ZYLOPRIM) 300 MG tablet Take 1 tablet (300 mg total) by mouth daily as needed (for gout). 30 tablet 1  . atorvastatin (LIPITOR) 40 MG tablet TAKE 1 TABLET BY MOUTH EVERY DAY 30 tablet 1  .  benazepril (LOTENSIN) 20 MG tablet Take 1 tablet (20 mg total) by mouth daily. 30 tablet 1  . carvedilol (COREG) 25 MG tablet Take 1 tablet (25 mg total) by mouth 2 (two) times daily with a meal. 60 tablet 1  . Cholecalciferol (VITAMIN D) 2000 UNITS CAPS Take 1 capsule by mouth daily.    . fish oil-omega-3 fatty acids 1000 MG capsule Take 2 g by mouth 2 (two) times daily.     . furosemide  (LASIX) 20 MG tablet Take 2 tablets (40 mg total) by mouth daily. 30 tablet 1  . glucose blood (ACCU-CHEK AVIVA PLUS) test strip Check two times daily. Dx code E11.65 100 each 0  . isosorbide mononitrate (IMDUR) 60 MG 24 hr tablet Take 1 tablet (60 mg total) by mouth daily. 30 tablet 1  . metFORMIN (GLUCOPHAGE) 500 MG tablet Take 1 tablet (500 mg total) by mouth 3 (three) times daily with meals. 90 tablet 1  . nystatin (MYCOSTATIN/NYSTOP) powder Apply topically 2 (two) times daily. 15 g 0  . potassium chloride SA (KLOR-CON M20) 20 MEQ tablet Take 1 tablet (20 mEq total) by mouth daily. 90 tablet 3  . triamcinolone cream (KENALOG) 0.1 % Apply 1 application topically 2 (two) times daily.    Marland Kitchen warfarin (COUMADIN) 4 MG tablet Take 1 tablet (4 mg total) by mouth one time only at 6 PM. Use as directed. 30 tablet 1   No current facility-administered medications on file prior to visit.     Allergies  Allergen Reactions  . Bee Venom Shortness Of Breath and Swelling  . Iodinated Diagnostic Agents Itching  . Penicillins Swelling  . Tape     Pulls the skin    Family History  Problem Relation Age of Onset  . Stroke Mother   . Heart disease Mother   . Heart attack Father   . Heart disease Father   . Heart disease Unknown   . Cancer Sister   . Heart disease Brother     Social History   Socioeconomic History  . Marital status: Widowed    Spouse name: Not on file  . Number of children: Not on file  . Years of education: Not on file  . Highest education level: Not on file  Occupational History  . Occupation: retired  Scientific laboratory technician  . Financial resource strain: Not on file  . Food insecurity:    Worry: Not on file    Inability: Not on file  . Transportation needs:    Medical: Not on file    Non-medical: Not on file  Tobacco Use  . Smoking status: Never Smoker  . Smokeless tobacco: Never Used  Substance and Sexual Activity  . Alcohol use: No  . Drug use: No  . Sexual activity: Not  Currently  Lifestyle  . Physical activity:    Days per week: Not on file    Minutes per session: Not on file  . Stress: Not on file  Relationships  . Social connections:    Talks on phone: Not on file    Gets together: Not on file    Attends religious service: Not on file    Active member of club or organization: Not on file    Attends meetings of clubs or organizations: Not on file    Relationship status: Not on file  . Intimate partner violence:    Fear of current or ex partner: Not on file    Emotionally abused: Not on file  Physically abused: Not on file    Forced sexual activity: Not on file  Other Topics Concern  . Not on file  Social History Narrative  . Not on file    ROS General: Denies fever, chills, night sweats, changes in weight, changes in appetite HEENT: Denies headaches, ear pain, changes in vision, rhinorrhea, sore throat CV: Denies CP, palpitations, SOB, orthopnea Pulm: Denies SOB, cough, wheezing GI: Denies abdominal pain, nausea, vomiting, diarrhea, constipation GU: Denies dysuria, hematuria, frequency, vaginal discharge Msk: Denies muscle cramps, joint pains Neuro: Denies weakness, numbness, tingling Skin: Denies rashes, bruising + varicose veins, venous stasis ulcers Psych: Denies depression, anxiety, hallucinations   BP (!) 90/50 (BP Location: Left Arm, Patient Position: Sitting, Cuff Size: Large)   Pulse 86   Temp 98 F (36.7 C) (Oral)   Ht _0  (1.626 m)   Wt 193 lb (87.5 kg)   SpO2 97%   BMI 33.13 kg/m   Physical Exam Gen. Pleasant, well developed, well-nourished, in NAD on 2 L  O2 via [place] HEENT -wearing glasses Blunt/AT, PERRL, no scleral icterus, no nasal drainage, dentures in place, pharynx without erythema or exudate.  TMs normal bilaterally. Lungs: no use of accessory muscles, CTAB, no wheezes, rales or rhonchi Cardiovascular: RRR,  No r/g/m, no peripheral edema Abdomen: BS present, soft, nontender,nondistended Neuro:  A&Ox3, CN  II-XII intact, ambulating with quad cane Skin:  Warm, dry, intact.  Healing venous stasis ulcers bilateral lower extremities  Recent Results (from the past 2160 hour(s))  Myocardial Perfusion Imaging     Status: None   Collection Time: 07/04/18 12:50 PM  Result Value Ref Range   Rest HR 68 bpm   Rest BP 150/77 mmHg   Peak HR 91 bpm   Peak BP 150/77 mmHg   SSS 3    SRS 1    SDS 2    TID 1.13   CBG monitoring, ED     Status: Abnormal   Collection Time: 08/09/18 10:13 AM  Result Value Ref Range   Glucose-Capillary 153 (H) 70 - 99 mg/dL  Basic metabolic panel     Status: Abnormal   Collection Time: 08/09/18 10:19 AM  Result Value Ref Range   Sodium 143 135 - 145 mmol/L   Potassium 3.0 (L) 3.5 - 5.1 mmol/L   Chloride 102 98 - 111 mmol/L   CO2 28 22 - 32 mmol/L   Glucose, Bld 156 (H) 70 - 99 mg/dL   BUN 12 8 - 23 mg/dL   Creatinine, Ser 0.93 0.44 - 1.00 mg/dL   Calcium 8.7 (L) 8.9 - 10.3 mg/dL   GFR calc non Af Amer 56 (L) >60 mL/min   GFR calc Af Amer >60 >60 mL/min    Comment: (NOTE) The eGFR has been calculated using the CKD EPI equation. This calculation has not been validated in all clinical situations. eGFR's persistently <60 mL/min signify possible Chronic Kidney Disease.    Anion gap 13 5 - 15    Comment: Performed at [facility] [address] [place], [place] [address]  Brain natriuretic peptide     Status: Abnormal   Collection Time: 08/09/18 10:19 AM  Result Value Ref Range   B Natriuretic Peptide 356.8 (H) 0.0 - 100.0 pg/mL    Comment: Performed at [facility] [address], [place], [place] [address]  Troponin I Once     Status: None   Collection Time: 08/09/18 10:19 AM  Result Value Ref Range  Troponin I <0.03 <0.03 ng/mL    Comment: Performed at [facility], [place] [address] [place], [place] [address]  CBC with Differential     Status: Abnormal   Collection Time: 08/09/18 10:19 AM  Result Value Ref Range   WBC 10.2 4.0 - 10.5  K/uL   RBC 4.78 3.87 - 5.11 MIL/uL   Hemoglobin 14.2 12.0 - 15.0 g/dL   HCT 46.0 36.0 - 46.0 %   MCV 96.2 80.0 - 100.0 fL   MCH 29.7 26.0 - 34.0 pg   MCHC 30.9 30.0 - 36.0 g/dL   RDW 14.3 11.5 - 15.5 %   Platelets 332 150 - 400 K/uL   nRBC 0.0 0.0 - 0.2 %   Neutrophils Relative % 81 %   Neutro Abs 8.3 (H) 1.7 - 7.7 K/uL   Lymphocytes Relative 11 %   Lymphs Abs 1.1 0.7 - 4.0 K/uL   Monocytes Relative 5 %   Monocytes Absolute 0.5 0.1 - 1.0 K/uL   Eosinophils Relative 2 %   Eosinophils Absolute 0.2 0.0 - 0.5 K/uL   Basophils Relative 0 %   Basophils Absolute 0.0 0.0 - 0.1 K/uL   Immature Granulocytes 1 %   Abs Immature Granulocytes 0.05 0.00 - 0.07 K/uL    Comment: Performed at [facility] [address], [place], [place] [address]  Protime-INR     Status: Abnormal   Collection Time: 08/09/18 10:19 AM  Result Value Ref Range   Prothrombin Time 23.1 (H) 11.4 - 15.2 seconds   INR 2.08     Comment: Performed at [facility] [address], [place], [place] [address]  TSH     Status: None   Collection Time: 08/09/18 10:19 AM  Result Value Ref Range   TSH 2.312 0.350 - 4.500 uIU/mL    Comment: Performed by a 3rd Generation assay with a functional sensitivity of <=0.01 uIU/mL. Performed at [facility], [place] [address] [place], [place] [address]   ECHOCARDIOGRAM COMPLETE     Status: None   Collection Time: 08/09/18  3:43 PM  Result Value Ref Range   Weight 3,312 oz   Height 64 in   BP 162/82 mmHg  Glucose, capillary     Status: Abnormal   Collection Time: 08/09/18  4:25 PM  Result Value Ref Range   Glucose-Capillary 103 (H) 70 - 99 mg/dL  Troponin I Now Then Q6H     Status: None   Collection Time: 08/09/18  4:33 PM  Result Value Ref Range   Troponin I <0.03 <0.03 ng/mL    Comment: Performed at [facility] [address], [place], [place] [address]  Hemoglobin A1c     Status: Abnormal   Collection Time: 08/09/18  4:33 PM  Result Value Ref Range     Hgb A1c MFr Bld 6.5 (H) 4.8 - 5.6 %    Comment: (NOTE) Pre diabetes:          5.7%-6.4% Diabetes:              >6.4% Glycemic control for   <7.0% adults with diabetes    Mean Plasma Glucose 139.85 mg/dL    Comment: Performed at [facility] [address], [place], [place] [address]  Glucose, capillary     Status: Abnormal   Collection Time: 08/09/18  9:35 PM  Result Value Ref Range   Glucose-Capillary 133 (H) 70 - 99 mg/dL  Troponin I Now  Then Q6H     Status: None   Collection Time: 08/10/18  3:24 AM  Result Value Ref Range   Troponin I <0.03 <0.03 ng/mL    Comment: Performed at [facility] [address], [place], [place] [address]  Basic metabolic panel     Status: Abnormal   Collection Time: 08/10/18  6:57 AM  Result Value Ref Range   Sodium 142 135 - 145 mmol/L   Potassium 4.0 3.5 - 5.1 mmol/L    Comment: DELTA CHECK NOTED   Chloride 104 98 - 111 mmol/L   CO2 20 (L) 22 - 32 mmol/L   Glucose, Bld 141 (H) 70 - 99 mg/dL   BUN 16 8 - 23 mg/dL   Creatinine, Ser 0.92 0.44 - 1.00 mg/dL   Calcium 8.2 (L) 8.9 - 10.3 mg/dL   GFR calc non Af Amer 57 (L) >60 mL/min   GFR calc Af Amer >60 >60 mL/min    Comment: (NOTE) The eGFR has been calculated using the CKD EPI equation. This calculation has not been validated in all clinical situations. eGFR's persistently <60 mL/min signify possible Chronic Kidney Disease.    Anion gap 18 (H) 5 - 15    Comment: Performed at [facility], [place] [address] [place], [place] [address]  Magnesium     Status: Abnormal   Collection Time: 08/10/18  6:57 AM  Result Value Ref Range   Magnesium 1.2 (L) 1.7 - 2.4 mg/dL    Comment: Performed at [facility] [address] [place], [place] [address]  Protime-INR     Status: Abnormal   Collection Time: 08/10/18  6:57 AM  Result Value Ref Range   Prothrombin Time 30.6 (H) 11.4 - 15.2 seconds   INR 2.99     Comment: Performed at [facility] [address]  [place], [place] [address]  Troponin I     Status: None   Collection Time: 08/10/18  6:57 AM  Result Value Ref Range   Troponin I <0.03 <0.03 ng/mL    Comment: Performed at [facility] [address], [place], [place] [address]  Glucose, capillary     Status: Abnormal   Collection Time: 08/10/18  7:47 AM  Result Value Ref Range   Glucose-Capillary 134 (H) 70 - 99 mg/dL   Comment 1 Notify RN    Comment 2 Document in Chart   Glucose, capillary     Status: Abnormal   Collection Time: 08/10/18 12:33 PM  Result Value Ref Range   Glucose-Capillary 151 (H) 70 - 99 mg/dL   Comment 1 Notify RN    Comment 2 Document in Chart   Glucose, capillary     Status: Abnormal   Collection Time: 08/10/18  4:15 PM  Result Value Ref Range   Glucose-Capillary 102 (H) 70 - 99 mg/dL   Comment 1 Notify RN    Comment 2 Document in Chart   Glucose, capillary     Status: Abnormal   Collection Time: 08/10/18  9:57 PM  Result Value Ref Range   Glucose-Capillary 153 (H) 70 - 99 mg/dL  Aldosterone + renin activity w/ ratio     Status: Abnormal   Collection Time: 08/11/18  7:19 AM  Result Value Ref Range   PRA LC/MS/MS <0.167 (L) 0.167 - 5.380 ng/mL/hr    Comment: (NOTE) This test was developed and its performance characteristics determined by [facility]. It has not been cleared or approved by  the Food and Drug Administration.    ALDO / PRA Ratio >47.3 (H) 0.0 - 30.0    Comment: (NOTE)                         Units:      ng/dL per ng/mL/hr Performed At: [facility] [place], [place] [id number] Rush Farmer MD [id number]    Aldosterone 7.9 0.0 - 30.0 ng/dL    Comment: (NOTE) This test was developed and its performance characteristics determined by [facility]. It has not been cleared or approved by the Food and Drug Administration.   Magnesium     Status: None   Collection Time: 08/11/18  7:19 AM  Result Value Ref Range   Magnesium 1.9 1.7 - 2.4 mg/dL    Comment: Performed at  [facility] [address], [place], [place] [address]  Comprehensive metabolic panel     Status: Abnormal   Collection Time: 08/11/18  7:19 AM  Result Value Ref Range   Sodium 142 135 - 145 mmol/L   Potassium 3.0 (L) 3.5 - 5.1 mmol/L   Chloride 100 98 - 111 mmol/L   CO2 34 (H) 22 - 32 mmol/L   Glucose, Bld 140 (H) 70 - 99 mg/dL   BUN 13 8 - 23 mg/dL   Creatinine, Ser 0.97 0.44 - 1.00 mg/dL   Calcium 8.3 (L) 8.9 - 10.3 mg/dL   Total Protein 6.2 (L) 6.5 - 8.1 g/dL   Albumin 2.9 (L) 3.5 - 5.0 g/dL   AST 16 15 - 41 U/L   ALT 13 0 - 44 U/L   Alkaline Phosphatase 48 38 - 126 U/L   Total Bilirubin 1.1 0.3 - 1.2 mg/dL   GFR calc non Af Amer 53 (L) >60 mL/min   GFR calc Af Amer >60 >60 mL/min    Comment: (NOTE) The eGFR has been calculated using the CKD EPI equation. This calculation has not been validated in all clinical situations. eGFR's persistently <60 mL/min signify possible Chronic Kidney Disease.    Anion gap 8 5 - 15    Comment: Performed at [facility] [address] [place], [place] [address]  Glucose, capillary     Status: Abnormal   Collection Time: 08/11/18  8:04 AM  Result Value Ref Range   Glucose-Capillary 123 (H) 70 - 99 mg/dL   Comment 1 Notify RN   Protime-INR     Status: Abnormal   Collection Time: 08/11/18 10:47 AM  Result Value Ref Range   Prothrombin Time 22.3 (H) 11.4 - 15.2 seconds   INR 1.99     Comment: Performed at [facility], [place] [address] [place], [place] [address]  Glucose, capillary     Status: Abnormal   Collection Time: 08/11/18 11:35 AM  Result Value Ref Range   Glucose-Capillary 159 (H) 70 - 99 mg/dL   Comment 1 Notify RN   Glucose, capillary     Status: Abnormal   Collection Time: 08/11/18  4:34 PM  Result Value Ref Range   Glucose-Capillary 164 (H) 70 - 99 mg/dL   Comment 1 Notify RN   Glucose, capillary     Status: Abnormal   Collection Time: 08/11/18  9:24 PM  Result Value Ref Range   Glucose-Capillary 104  (H) 70 - 99 mg/dL  Basic metabolic panel     Status: Abnormal   Collection Time: 08/12/18  4:59 AM  Result Value  Ref Range   Sodium 140 135 - 145 mmol/L   Potassium 4.0 3.5 - 5.1 mmol/L   Chloride 98 98 - 111 mmol/L   CO2 32 22 - 32 mmol/L   Glucose, Bld 159 (H) 70 - 99 mg/dL   BUN 19 8 - 23 mg/dL   Creatinine, Ser 0.99 0.44 - 1.00 mg/dL   Calcium 8.4 (L) 8.9 - 10.3 mg/dL   GFR calc non Af Amer 52 (L) >60 mL/min   GFR calc Af Amer >60 >60 mL/min    Comment: (NOTE) The eGFR has been calculated using the CKD EPI equation. This calculation has not been validated in all clinical situations. eGFR's persistently <60 mL/min signify possible Chronic Kidney Disease.    Anion gap 10 5 - 15    Comment: Performed at [facility] [address], [place], [place] [address]  Magnesium     Status: None   Collection Time: 08/12/18  4:59 AM  Result Value Ref Range   Magnesium 1.7 1.7 - 2.4 mg/dL    Comment: Performed at [facility] [address] [place], [place] [address]  Glucose, capillary     Status: Abnormal   Collection Time: 08/12/18  7:55 AM  Result Value Ref Range   Glucose-Capillary 151 (H) 70 - 99 mg/dL   Comment 1 Notify RN   Protime-INR     Status: Abnormal   Collection Time: 08/12/18 10:31 AM  Result Value Ref Range   Prothrombin Time 21.3 (H) 11.4 - 15.2 seconds   INR 1.87     Comment: Performed at [facility], [place] [address] [place], [place] [address]  Glucose, capillary     Status: Abnormal   Collection Time: 08/12/18 11:43 AM  Result Value Ref Range   Glucose-Capillary 183 (H) 70 - 99 mg/dL   Comment 1 Notify RN   Glucose, capillary     Status: Abnormal   Collection Time: 08/12/18  4:26 PM  Result Value Ref Range   Glucose-Capillary 121 (H) 70 - 99 mg/dL   Comment 1 Notify RN   Glucose, capillary     Status: Abnormal   Collection Time: 08/12/18  9:40 PM  Result Value Ref Range   Glucose-Capillary 122 (H) 70 - 99 mg/dL   Comment 1 Notify RN      Comment 2 Document in Chart   Protime-INR     Status: Abnormal   Collection Time: 08/13/18  3:54 AM  Result Value Ref Range   Prothrombin Time 21.1 (H) 11.4 - 15.2 seconds   INR 1.85     Comment: Performed at [facility], [place] [address] [place], [place] [address]  Glucose, capillary     Status: Abnormal   Collection Time: 08/13/18  8:09 AM  Result Value Ref Range   Glucose-Capillary 118 (H) 70 - 99 mg/dL  POCT INR     Status: None   Collection Time: 08/19/18 12:00 AM  Result Value Ref Range   INR 2.1 2.0 - 3.0  Basic metabolic panel     Status: Abnormal   Collection Time: 08/20/18 12:47 PM  Result Value Ref Range   Sodium 141 135 - 145 mmol/L   Potassium 3.4 (L) 3.5 - 5.1 mmol/L   Chloride 99 98 - 111 mmol/L   CO2 32 22 - 32 mmol/L   Glucose, Bld 125 (H) 70 - 99 mg/dL   BUN 32 (H) 8 - 23 mg/dL   Creatinine, Ser 1.06 (  H) 0.44 - 1.00 mg/dL   Calcium 9.3 8.9 - 10.3 mg/dL   GFR calc non Af Amer 48 (L) >60 mL/min   GFR calc Af Amer 56 (L) >60 mL/min    Comment: (NOTE) The eGFR has been calculated using the CKD EPI equation. This calculation has not been validated in all clinical situations. eGFR's persistently <60 mL/min signify possible Chronic Kidney Disease.    Anion gap 10 5 - 15    Comment: Performed at [facility] [address] [place], [place] [address]  POCT INR     Status: None   Collection Time: 08/26/18  2:50 PM  Result Value Ref Range   INR 2.4 2.0 - 3.0    Assessment/Plan: Essential hypertension -controlled  -continue benazepril 20 mg, Coreg 25 mg twice daily, Lasix alternating 20 and 40 mg every other day -Discussed sodium restrictions -Given handouts -Discussed checking BP at home  Chronic diastolic heart failure (HCC) -Discussed monitoring sodium intake and fluid restrictions -Discussed daily weight -Continue follow-up with cardiology -Continue Coreg 25 mg twice daily, Lasix alternating 20 and 40 mg every other day,  imdur  Controlled type 2 diabetes mellitus without complication, without long-term current use of insulin (HCC) -Controlled -Continue metformin 700 mg 3 times daily -Patient encouraged to continue checking FS BS at home and keep a log of her blood sugars -Continue lifestyle modifications  Varicose veins of both lower extremities, unspecified whether complicated -Discussed wearing TED hose/support stockings -Discussed elevating lower extremities when sitting. -Continue referral to vein and vascular if becomes painful or for continued ulcers.  History of CVA (cerebrovascular accident) -Discussed lifestyle modifications -Continue Lipitor 40 mg daily -Continue to control blood sugar and blood pressure  Chronic atrial fibrillation -Stable -Continue Coumadin 20 mg daily -Continue following with cardiology  Chronic anticoagulation -On chronic Coumadin 2/2 A. fib -Discussed following up with Coumadin clinic if home health nurse will not be able to continue coming to her home for INR check -Continue Coumadin 4 mg daily  History of gout -Advised on low purine diet -Given handout -Continue allopurinol  Encounter to establish care -We reviewed the PMH, PSH, FH, SH, Meds and Allergies. -We provided refills for any medications we will prescribe as needed. -We addressed current concerns per orders and patient instructions. -We have asked for records for pertinent exams, studies, vaccines and notes from previous providers. -We have advised patient to follow up per instructions below.  Follow-up PRN in the next few months  Grier Mitts, MD

## 2018-09-05 ENCOUNTER — Other Ambulatory Visit: Payer: Self-pay | Admitting: Internal Medicine

## 2018-09-05 DIAGNOSIS — M109 Gout, unspecified: Secondary | ICD-10-CM | POA: Diagnosis not present

## 2018-09-05 DIAGNOSIS — I110 Hypertensive heart disease with heart failure: Secondary | ICD-10-CM | POA: Diagnosis not present

## 2018-09-05 DIAGNOSIS — I5033 Acute on chronic diastolic (congestive) heart failure: Secondary | ICD-10-CM | POA: Diagnosis not present

## 2018-09-05 DIAGNOSIS — I4891 Unspecified atrial fibrillation: Secondary | ICD-10-CM | POA: Diagnosis not present

## 2018-09-05 DIAGNOSIS — I2510 Atherosclerotic heart disease of native coronary artery without angina pectoris: Secondary | ICD-10-CM | POA: Diagnosis not present

## 2018-09-05 DIAGNOSIS — E119 Type 2 diabetes mellitus without complications: Secondary | ICD-10-CM | POA: Diagnosis not present

## 2018-09-08 ENCOUNTER — Other Ambulatory Visit: Payer: Self-pay

## 2018-09-08 NOTE — Patient Outreach (Signed)
[facility]) Care Management  09/08/2018  Rebekah Stewart [birth year] [id number]     EMMI-HF RED ON EMMI ALERT Day # 23 Date: 09/08/18 Red Alert Reason: " Weight? 189 lbs  Any new problems? Yes New/worsening problems? Yes  New swelling? Yes  New/worsening SOB? Yes  Nausea or vomiting? Yes  Tired/fatigued? Yes  Fever or chills? Yes  Other symptoms/problems? Yes"   Outreach attempt # 1 to patient. Spoke with patient who voices she is doing fine. She denies any acute issues or concerns. Reviewed and addressed red alerts with patient. She voices that her weight did go up to 189 lbs over the weekend. Patient keeps food log and was able to recall that on Friday she ate small serving spaghetti and that was the only meal she had that day that had salt in it. She denies having any swelling or edema during this time or today. She reports that weight is back down to 187 lbs this morning. Patient denies experiencing any of the above symptoms that automated machine recorded as yes responses. She denies any further RN CM needs or concerns at this time. She is aware that she will continue to get automated EMMI-HF post discharge calls and is appreciative of follow up call.        Plan: RN CM will close case as no further interventions needed at this time.    Enzo Montgomery, RN,BSN,CCM [place] Management Telephonic Care Management Coordinator Direct Phone: [phone] Toll Free: [phone] Fax: [phone]

## 2018-09-09 ENCOUNTER — Other Ambulatory Visit: Payer: Self-pay

## 2018-09-09 DIAGNOSIS — M109 Gout, unspecified: Secondary | ICD-10-CM | POA: Diagnosis not present

## 2018-09-09 DIAGNOSIS — I5033 Acute on chronic diastolic (congestive) heart failure: Secondary | ICD-10-CM | POA: Diagnosis not present

## 2018-09-09 DIAGNOSIS — I4891 Unspecified atrial fibrillation: Secondary | ICD-10-CM | POA: Diagnosis not present

## 2018-09-09 DIAGNOSIS — E119 Type 2 diabetes mellitus without complications: Secondary | ICD-10-CM | POA: Diagnosis not present

## 2018-09-09 DIAGNOSIS — I110 Hypertensive heart disease with heart failure: Secondary | ICD-10-CM | POA: Diagnosis not present

## 2018-09-09 DIAGNOSIS — I2510 Atherosclerotic heart disease of native coronary artery without angina pectoris: Secondary | ICD-10-CM | POA: Diagnosis not present

## 2018-09-09 NOTE — Patient Outreach (Signed)
[facility]) Care Management  09/09/2018  Rebekah Stewart 08-Jan-1935 [id number]     EMMI-HF RED ON EMMI ALERT Day # 25 Date: 09/08/18 Red Alert Reason: " Any new problems? Yes  New/worsening problems? Yes  New swelling? Yes"   Red on EMMI dashboard received. No outreach call warranted to patient at this time. RN CM addressed issue on previous call. Patient voiced error in response.      Plan: RN CM will close case at this time.    Enzo Montgomery, RN,BSN,CCM [place] Management Telephonic Care Management Coordinator Direct Phone: [phone] Toll Free: [phone] Fax: [phone]

## 2018-09-10 DIAGNOSIS — M109 Gout, unspecified: Secondary | ICD-10-CM | POA: Diagnosis not present

## 2018-09-10 DIAGNOSIS — I4891 Unspecified atrial fibrillation: Secondary | ICD-10-CM | POA: Diagnosis not present

## 2018-09-10 DIAGNOSIS — I5033 Acute on chronic diastolic (congestive) heart failure: Secondary | ICD-10-CM | POA: Diagnosis not present

## 2018-09-10 DIAGNOSIS — E119 Type 2 diabetes mellitus without complications: Secondary | ICD-10-CM | POA: Diagnosis not present

## 2018-09-10 DIAGNOSIS — I2510 Atherosclerotic heart disease of native coronary artery without angina pectoris: Secondary | ICD-10-CM | POA: Diagnosis not present

## 2018-09-10 DIAGNOSIS — I110 Hypertensive heart disease with heart failure: Secondary | ICD-10-CM | POA: Diagnosis not present

## 2018-09-11 ENCOUNTER — Encounter: Payer: Self-pay | Admitting: Family Medicine

## 2018-09-12 DIAGNOSIS — I4891 Unspecified atrial fibrillation: Secondary | ICD-10-CM | POA: Diagnosis not present

## 2018-09-12 DIAGNOSIS — I5033 Acute on chronic diastolic (congestive) heart failure: Secondary | ICD-10-CM | POA: Diagnosis not present

## 2018-09-12 DIAGNOSIS — I2510 Atherosclerotic heart disease of native coronary artery without angina pectoris: Secondary | ICD-10-CM | POA: Diagnosis not present

## 2018-09-12 DIAGNOSIS — I110 Hypertensive heart disease with heart failure: Secondary | ICD-10-CM | POA: Diagnosis not present

## 2018-09-12 DIAGNOSIS — M109 Gout, unspecified: Secondary | ICD-10-CM | POA: Diagnosis not present

## 2018-09-12 DIAGNOSIS — E119 Type 2 diabetes mellitus without complications: Secondary | ICD-10-CM | POA: Diagnosis not present

## 2018-09-15 DIAGNOSIS — I110 Hypertensive heart disease with heart failure: Secondary | ICD-10-CM | POA: Diagnosis not present

## 2018-09-15 DIAGNOSIS — E119 Type 2 diabetes mellitus without complications: Secondary | ICD-10-CM | POA: Diagnosis not present

## 2018-09-15 DIAGNOSIS — I5033 Acute on chronic diastolic (congestive) heart failure: Secondary | ICD-10-CM | POA: Diagnosis not present

## 2018-09-15 DIAGNOSIS — I4891 Unspecified atrial fibrillation: Secondary | ICD-10-CM | POA: Diagnosis not present

## 2018-09-15 DIAGNOSIS — M109 Gout, unspecified: Secondary | ICD-10-CM | POA: Diagnosis not present

## 2018-09-15 DIAGNOSIS — I2510 Atherosclerotic heart disease of native coronary artery without angina pectoris: Secondary | ICD-10-CM | POA: Diagnosis not present

## 2018-09-16 DIAGNOSIS — M109 Gout, unspecified: Secondary | ICD-10-CM | POA: Diagnosis not present

## 2018-09-16 DIAGNOSIS — E119 Type 2 diabetes mellitus without complications: Secondary | ICD-10-CM | POA: Diagnosis not present

## 2018-09-16 DIAGNOSIS — I4891 Unspecified atrial fibrillation: Secondary | ICD-10-CM | POA: Diagnosis not present

## 2018-09-16 DIAGNOSIS — I110 Hypertensive heart disease with heart failure: Secondary | ICD-10-CM | POA: Diagnosis not present

## 2018-09-16 DIAGNOSIS — I5033 Acute on chronic diastolic (congestive) heart failure: Secondary | ICD-10-CM | POA: Diagnosis not present

## 2018-09-16 DIAGNOSIS — I2510 Atherosclerotic heart disease of native coronary artery without angina pectoris: Secondary | ICD-10-CM | POA: Diagnosis not present

## 2018-09-18 ENCOUNTER — Ambulatory Visit: Payer: Medicare Other | Admitting: Cardiology

## 2018-09-21 DIAGNOSIS — I4891 Unspecified atrial fibrillation: Secondary | ICD-10-CM | POA: Diagnosis not present

## 2018-09-21 DIAGNOSIS — M109 Gout, unspecified: Secondary | ICD-10-CM | POA: Diagnosis not present

## 2018-09-21 DIAGNOSIS — I110 Hypertensive heart disease with heart failure: Secondary | ICD-10-CM | POA: Diagnosis not present

## 2018-09-21 DIAGNOSIS — E119 Type 2 diabetes mellitus without complications: Secondary | ICD-10-CM | POA: Diagnosis not present

## 2018-09-21 DIAGNOSIS — I5033 Acute on chronic diastolic (congestive) heart failure: Secondary | ICD-10-CM | POA: Diagnosis not present

## 2018-09-21 DIAGNOSIS — I2510 Atherosclerotic heart disease of native coronary artery without angina pectoris: Secondary | ICD-10-CM | POA: Diagnosis not present

## 2018-09-23 ENCOUNTER — Ambulatory Visit: Payer: Medicare Other | Admitting: Cardiology

## 2018-09-23 DIAGNOSIS — I2510 Atherosclerotic heart disease of native coronary artery without angina pectoris: Secondary | ICD-10-CM | POA: Diagnosis not present

## 2018-09-23 DIAGNOSIS — I5033 Acute on chronic diastolic (congestive) heart failure: Secondary | ICD-10-CM | POA: Diagnosis not present

## 2018-09-23 DIAGNOSIS — E119 Type 2 diabetes mellitus without complications: Secondary | ICD-10-CM | POA: Diagnosis not present

## 2018-09-23 DIAGNOSIS — M109 Gout, unspecified: Secondary | ICD-10-CM | POA: Diagnosis not present

## 2018-09-23 DIAGNOSIS — I4891 Unspecified atrial fibrillation: Secondary | ICD-10-CM | POA: Diagnosis not present

## 2018-09-23 DIAGNOSIS — I110 Hypertensive heart disease with heart failure: Secondary | ICD-10-CM | POA: Diagnosis not present

## 2018-09-30 DIAGNOSIS — I5033 Acute on chronic diastolic (congestive) heart failure: Secondary | ICD-10-CM | POA: Diagnosis not present

## 2018-09-30 DIAGNOSIS — I4891 Unspecified atrial fibrillation: Secondary | ICD-10-CM | POA: Diagnosis not present

## 2018-09-30 DIAGNOSIS — I110 Hypertensive heart disease with heart failure: Secondary | ICD-10-CM | POA: Diagnosis not present

## 2018-09-30 DIAGNOSIS — I2510 Atherosclerotic heart disease of native coronary artery without angina pectoris: Secondary | ICD-10-CM | POA: Diagnosis not present

## 2018-09-30 DIAGNOSIS — E119 Type 2 diabetes mellitus without complications: Secondary | ICD-10-CM | POA: Diagnosis not present

## 2018-09-30 DIAGNOSIS — M109 Gout, unspecified: Secondary | ICD-10-CM | POA: Diagnosis not present

## 2018-10-03 ENCOUNTER — Other Ambulatory Visit (HOSPITAL_COMMUNITY): Payer: Self-pay | Admitting: Internal Medicine

## 2018-10-03 DIAGNOSIS — E119 Type 2 diabetes mellitus without complications: Secondary | ICD-10-CM

## 2018-10-06 ENCOUNTER — Other Ambulatory Visit (HOSPITAL_COMMUNITY): Payer: Self-pay | Admitting: Internal Medicine

## 2018-10-06 DIAGNOSIS — I4891 Unspecified atrial fibrillation: Secondary | ICD-10-CM | POA: Diagnosis not present

## 2018-10-06 DIAGNOSIS — I2510 Atherosclerotic heart disease of native coronary artery without angina pectoris: Secondary | ICD-10-CM | POA: Diagnosis not present

## 2018-10-06 DIAGNOSIS — I5022 Chronic systolic (congestive) heart failure: Secondary | ICD-10-CM | POA: Diagnosis not present

## 2018-10-06 DIAGNOSIS — E1151 Type 2 diabetes mellitus with diabetic peripheral angiopathy without gangrene: Secondary | ICD-10-CM | POA: Diagnosis not present

## 2018-10-06 DIAGNOSIS — E119 Type 2 diabetes mellitus without complications: Secondary | ICD-10-CM

## 2018-10-06 DIAGNOSIS — I5033 Acute on chronic diastolic (congestive) heart failure: Secondary | ICD-10-CM | POA: Diagnosis not present

## 2018-10-06 DIAGNOSIS — M109 Gout, unspecified: Secondary | ICD-10-CM | POA: Diagnosis not present

## 2018-10-06 DIAGNOSIS — I110 Hypertensive heart disease with heart failure: Secondary | ICD-10-CM | POA: Diagnosis not present

## 2018-10-08 ENCOUNTER — Ambulatory Visit: Payer: Medicare Other | Admitting: Sports Medicine

## 2018-10-09 ENCOUNTER — Ambulatory Visit: Payer: Medicare Other | Admitting: Cardiology

## 2018-10-09 DIAGNOSIS — I5033 Acute on chronic diastolic (congestive) heart failure: Secondary | ICD-10-CM | POA: Diagnosis not present

## 2018-10-09 DIAGNOSIS — M109 Gout, unspecified: Secondary | ICD-10-CM | POA: Diagnosis not present

## 2018-10-09 DIAGNOSIS — I10 Essential (primary) hypertension: Secondary | ICD-10-CM | POA: Diagnosis not present

## 2018-10-09 DIAGNOSIS — I4891 Unspecified atrial fibrillation: Secondary | ICD-10-CM | POA: Diagnosis not present

## 2018-10-09 DIAGNOSIS — I110 Hypertensive heart disease with heart failure: Secondary | ICD-10-CM | POA: Diagnosis not present

## 2018-10-09 DIAGNOSIS — E119 Type 2 diabetes mellitus without complications: Secondary | ICD-10-CM | POA: Diagnosis not present

## 2018-10-09 DIAGNOSIS — I2510 Atherosclerotic heart disease of native coronary artery without angina pectoris: Secondary | ICD-10-CM | POA: Diagnosis not present

## 2018-10-10 ENCOUNTER — Telehealth: Payer: Self-pay

## 2018-10-10 ENCOUNTER — Telehealth: Payer: Self-pay | Admitting: Cardiology

## 2018-10-10 ENCOUNTER — Encounter: Payer: Self-pay | Admitting: Cardiology

## 2018-10-10 ENCOUNTER — Ambulatory Visit (INDEPENDENT_AMBULATORY_CARE_PROVIDER_SITE_OTHER): Payer: Medicare Other | Admitting: Cardiology

## 2018-10-10 VITALS — BP 120/64 | HR 92 | Ht 64.0 in | Wt 196.0 lb

## 2018-10-10 DIAGNOSIS — I10 Essential (primary) hypertension: Secondary | ICD-10-CM | POA: Diagnosis not present

## 2018-10-10 DIAGNOSIS — N289 Disorder of kidney and ureter, unspecified: Secondary | ICD-10-CM | POA: Diagnosis not present

## 2018-10-10 DIAGNOSIS — I2510 Atherosclerotic heart disease of native coronary artery without angina pectoris: Secondary | ICD-10-CM | POA: Diagnosis not present

## 2018-10-10 DIAGNOSIS — E119 Type 2 diabetes mellitus without complications: Secondary | ICD-10-CM | POA: Diagnosis not present

## 2018-10-10 DIAGNOSIS — I272 Pulmonary hypertension, unspecified: Secondary | ICD-10-CM

## 2018-10-10 DIAGNOSIS — I5032 Chronic diastolic (congestive) heart failure: Secondary | ICD-10-CM

## 2018-10-10 DIAGNOSIS — I83893 Varicose veins of bilateral lower extremities with other complications: Secondary | ICD-10-CM | POA: Diagnosis not present

## 2018-10-10 DIAGNOSIS — J9691 Respiratory failure, unspecified with hypoxia: Secondary | ICD-10-CM

## 2018-10-10 DIAGNOSIS — Z7901 Long term (current) use of anticoagulants: Secondary | ICD-10-CM | POA: Diagnosis not present

## 2018-10-10 DIAGNOSIS — I482 Chronic atrial fibrillation, unspecified: Secondary | ICD-10-CM

## 2018-10-10 DIAGNOSIS — E785 Hyperlipidemia, unspecified: Secondary | ICD-10-CM | POA: Diagnosis not present

## 2018-10-10 MED ORDER — POTASSIUM CHLORIDE CRYS ER 10 MEQ PO TBCR: 10.0000 meq | EXTENDED_RELEASE_TABLET | Freq: Every day | ORAL | Status: DC

## 2018-10-10 NOTE — Patient Instructions (Signed)
Medication Instructions:  Your physician recommends that you continue on your current medications as directed. Please refer to the Current Medication list given to you today.  If you need a refill on your cardiac medications before your next appointment, please call your pharmacy.   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 A paper copy in the mail If you have any lab test that is abnormal or we need to change your treatment, we will call you to review the results.  Testing/Procedures: None ordered  Follow-Up: At [facility], you and your health needs are our priority.  As part of our continuing mission to provide you with exceptional heart care, we have created designated Provider Care Teams.  These Care Teams include your primary Cardiologist (physician) and Advanced Practice Providers (APPs -  Physician Assistants and Nurse Practitioners) who all work together to provide you with the care you need, when you need it. You will need a follow up appointment in 3  months with Dr.Arida.    You have been referred to [facility]

## 2018-10-10 NOTE — Assessment & Plan Note (Signed)
EF 55-60%, mild LVH Nov 2019-compensated on exam today

## 2018-10-10 NOTE — Telephone Encounter (Signed)
Patient wants to know if she healthy enough to get a flu shot

## 2018-10-10 NOTE — Assessment & Plan Note (Signed)
Warfarin

## 2018-10-10 NOTE — Assessment & Plan Note (Signed)
Rate controlled

## 2018-10-10 NOTE — Telephone Encounter (Signed)
Patient saw Lurena Joiner PA today. She is questioning if she can get a FLU vaccine.   Routed to PA and covering nurse Harriette Bouillon

## 2018-10-10 NOTE — Telephone Encounter (Signed)
Spoke with pt. Per Jana Half it would be ok from a cardiac standpoint for her to get the Flu vaccine. Pt verbalized understanding and voiced appreciation for the call.

## 2018-10-10 NOTE — Assessment & Plan Note (Signed)
RLE varicosities

## 2018-10-10 NOTE — Progress Notes (Signed)
10/10/2018 Rebekah Stewart   [birth year]  [id number]  Primary Physician Nicoletta Dress, MD Primary Cardiologist: Dr Fletcher Anon  HPI: The patient is a delightful 84 year old female with a history of chronic atrial fibrillation, coronary disease, hypertension, non-insulin-dependent diabetes, chronic lower extremity edema, Coumadin therapy, and gout.  She had a remote catheterization in [facility], I do not have those details.  She had a Myoview 07/04/2018 that was low risk.  She was admitted to [facility] in November 5427 with diastolic heart failure.  She ended up being discharged on oxygen.  Echocardiogram during that admission showed preserved LV function with moderate pulmonary hypertension.  Her PA pressures were 59 mmHg.  She had moderate TR.  Cardiology did not see her during that admission.  She was then seen in follow-up by Roderic Palau in the A. fib clinic.  Her diuretics were backed off.a little. She is in the office today with her son for follow up.  The pt is quit talkative and has several issues she wants to discuss. From a CHF standpoint she appears to be stable. She has chronic LE edema "much better than it was". She remains on O2.  She has never seen a pulmonologist, smoked, or had a diagnosis of pulmonary disease.    Current Outpatient Medications  Medication Sig Dispense Refill  . allopurinol (ZYLOPRIM) 300 MG tablet Take 1 tablet (300 mg total) by mouth daily as needed (for gout). 30 tablet 1  . atorvastatin (LIPITOR) 40 MG tablet TAKE 1 TABLET BY MOUTH EVERY DAY 30 tablet 1  . benazepril (LOTENSIN) 20 MG tablet Take 1 tablet (20 mg total) by mouth daily. 30 tablet 1  . carvedilol (COREG) 25 MG tablet Take 1 tablet (25 mg total) by mouth 2 (two) times daily with a meal. 60 tablet 1  . Cholecalciferol (VITAMIN D) 2000 UNITS CAPS Take 1 capsule by mouth daily.    . fish oil-omega-3 fatty acids 1000 MG capsule Take 2 g by mouth 2 (two) times daily.     . FUROSEMIDE PO Take by mouth.  Alternate 10m and 414mdaily     . glucose blood (ACCU-CHEK AVIVA PLUS) test strip Check two times daily. Dx code E11.65 100 each 0  . isosorbide mononitrate (IMDUR) 60 MG 24 hr tablet Take 1 tablet (60 mg total) by mouth daily. 30 tablet 1  . metFORMIN (GLUCOPHAGE) 500 MG tablet Take 1 tablet (500 mg total) by mouth 3 (three) times daily with meals. 90 tablet 1  . nystatin (MYCOSTATIN/NYSTOP) powder Apply topically 2 (two) times daily. 15 g 0  . potassium chloride SA (KLOR-CON M20) 20 MEQ tablet Take 1 tablet (20 mEq total) by mouth daily. 90 tablet 3  . triamcinolone cream (KENALOG) 0.1 % Apply 1 application topically 2 (two) times daily.    . Marland Kitchenarfarin (COUMADIN) 4 MG tablet Take 1 tablet (4 mg total) by mouth one time only at 6 PM. Use as directed. 30 tablet 1   No current facility-administered medications for this visit.     Allergies  Allergen Reactions  . Bee Venom Shortness Of Breath and Swelling  . Iodinated Diagnostic Agents Itching  . Penicillins Swelling  . Tape     Pulls the skin    Past Medical History:  Diagnosis Date  . Arthritis of back    lumbar  . Atrial fibrillation ([facility]  . Atrial fibrillation ([facility]  . Atrial fibrillation ([facility]  . Chronic diastolic heart failure ([facility]  .  Colonic polyp   . Compression fracture of thoracic vertebra (HCC)   . Congestive heart failure, unspecified   . Coronary artery disease    mild non obstructive disease per cardiac cath in 2010  . Degeneration of lumbar or lumbosacral intervertebral disc   . Diverticulosis   . DM2 (diabetes mellitus, type 2) ([place])   . Hemorrhoids   . HLD (hyperlipidemia)   . HTN (hypertension)    diagnosed when she was in her 41s. Refractory. No RAS by [place] in 2010  . Long-term (current) use of anticoagulants   . Lumbosacral spondylosis without myelopathy   . Osteoarthrosis, unspecified whether generalized or localized, unspecified site   . Pain in thoracic spine   . Swelling of limb   . UTI  (lower urinary tract infection)   . Varicose veins of lower extremities with other complications   . Vitamin D deficiency     Social History   Socioeconomic History  . Marital status: Widowed    Spouse name: Not on file  . Number of children: Not on file  . Years of education: Not on file  . Highest education level: Not on file  Occupational History  . Occupation: retired  Scientific laboratory technician  . Financial resource strain: Not on file  . Food insecurity:    Worry: Not on file    Inability: Not on file  . Transportation needs:    Medical: Not on file    Non-medical: Not on file  Tobacco Use  . Smoking status: Never Smoker  . Smokeless tobacco: Never Used  Substance and Sexual Activity  . Alcohol use: No  . Drug use: No  . Sexual activity: Not Currently  Lifestyle  . Physical activity:    Days per week: Not on file    Minutes per session: Not on file  . Stress: Not on file  Relationships  . Social connections:    Talks on phone: Not on file    Gets together: Not on file    Attends religious service: Not on file    Active member of club or organization: Not on file    Attends meetings of clubs or organizations: Not on file    Relationship status: Not on file  . Intimate partner violence:    Fear of current or ex partner: Not on file    Emotionally abused: Not on file    Physically abused: Not on file    Forced sexual activity: Not on file  Other Topics Concern  . Not on file  Social History Narrative  . Not on file     Family History  Problem Relation Age of Onset  . Stroke Mother   . Heart disease Mother   . Heart attack Father   . Heart disease Father   . Heart disease Other   . Cancer Sister   . Heart disease Brother      Review of Systems: General: negative for chills, fever, night sweats or weight changes.  Cardiovascular: negative for chest pain, orthopnea, palpitations, paroxysmal nocturnal dyspnea or shortness of breath Dermatological: negative for  rash Respiratory: negative for cough or wheezing Urologic: negative for hematuria Abdominal: negative for nausea, vomiting, diarrhea, bright red blood per rectum, melena, or hematemesis Neurologic: negative for visual changes, syncope, or dizziness All other systems reviewed and are otherwise negative except as noted above.    Blood pressure 120/64, pulse 92, height _0  (1.626 m), weight 196 lb (88.9 kg).  General appearance: alert, cooperative,  no distress and moderately obese Lungs: kyphosis, decreased breath sounds Lt base Heart: irregularly irregular rhythm Extremities: varicosities Rt leg with trace edema Skin: pale cool and dry Neurologic: Grossly normal  EKG AF with VR 90  ASSESSMENT AND PLAN:   Respiratory failure with hypoxia ([place]) Pt discharged on O2 after nov 2019 admission for respiratory failure and diastolic CHF. No history of smoking or prior lung disease. Elavated PA pressure by echo-moderate pulmonary HTN  Chronic diastolic heart failure (HCC) EF 55-60%, mild LVH Nov 2019-compensated on exam today  Coronary artery disease mild non obstructive disease per cardiac cath in 2010-(worst was 50% LAD) Low risk Myoview Oct 2019  Chronic atrial fibrillation Rate controlled  Essential hypertension diagnosed when she was in her 72s. Refractory. No RAS by [place] in 2010 Under control on current medications  Chronic anticoagulation Warfarin  Varicose veins of lower extremities with complications, bilateral RLE varicosities   PLAN  She had labs drawn yesterday by [facility]- I will try and get those results.  I did not change her medications. I do think she would benefit from pulmonary consult and I'll arrange that.  F/U Dr Fletcher Anon in 3 months.   Kerin Ransom PA-C 10/10/2018 11:27 AM

## 2018-10-10 NOTE — Telephone Encounter (Signed)
Called pt pcp Dr.Schultz office to rqst a copy of the labs drawn by the pt [facility] nurse on 10/09/18. Spoke with a nurse. The results are in Dr.Schultz's inbox for him to review. Since they have not been reviewed  they are unable to fax the results, but she can give the verbal values.  BUN-24 Creatinine-0.89 Potassium- 5.1 Sodium -[address] [place] is aware of the results and instructs the pt to reduce her Potassium to 1/2 tab 62mq.  DPR on file. Spoke with the pt son DMontine Circle DMontine Circleis aware of Luke's instructions and verbalizes understanding. Pt med lis updated.

## 2018-10-10 NOTE — Assessment & Plan Note (Signed)
mild non obstructive disease per cardiac cath in 2010-(worst was 50% LAD) Low risk Myoview Oct 2019

## 2018-10-10 NOTE — Assessment & Plan Note (Signed)
diagnosed when she was in her 4s. Refractory. No RAS by [place] in 2010 Under control on current medications

## 2018-10-10 NOTE — Assessment & Plan Note (Signed)
Pt discharged on O2 after nov 2019 admission for respiratory failure and diastolic CHF. No history of smoking or prior lung disease. Elavated PA pressure by echo-moderate pulmonary HTN

## 2018-10-13 DIAGNOSIS — Z7984 Long term (current) use of oral hypoglycemic drugs: Secondary | ICD-10-CM | POA: Diagnosis not present

## 2018-10-13 DIAGNOSIS — Z7901 Long term (current) use of anticoagulants: Secondary | ICD-10-CM | POA: Diagnosis not present

## 2018-10-13 DIAGNOSIS — Z9981 Dependence on supplemental oxygen: Secondary | ICD-10-CM | POA: Diagnosis not present

## 2018-10-13 DIAGNOSIS — Z23 Encounter for immunization: Secondary | ICD-10-CM | POA: Diagnosis not present

## 2018-10-13 DIAGNOSIS — E119 Type 2 diabetes mellitus without complications: Secondary | ICD-10-CM | POA: Diagnosis not present

## 2018-10-13 DIAGNOSIS — Z5181 Encounter for therapeutic drug level monitoring: Secondary | ICD-10-CM | POA: Diagnosis not present

## 2018-10-13 DIAGNOSIS — I4891 Unspecified atrial fibrillation: Secondary | ICD-10-CM | POA: Diagnosis not present

## 2018-10-13 DIAGNOSIS — I5033 Acute on chronic diastolic (congestive) heart failure: Secondary | ICD-10-CM | POA: Diagnosis not present

## 2018-10-14 DIAGNOSIS — Z7901 Long term (current) use of anticoagulants: Secondary | ICD-10-CM | POA: Diagnosis not present

## 2018-10-14 DIAGNOSIS — Z5181 Encounter for therapeutic drug level monitoring: Secondary | ICD-10-CM | POA: Diagnosis not present

## 2018-10-14 DIAGNOSIS — I5033 Acute on chronic diastolic (congestive) heart failure: Secondary | ICD-10-CM | POA: Diagnosis not present

## 2018-10-14 DIAGNOSIS — E119 Type 2 diabetes mellitus without complications: Secondary | ICD-10-CM | POA: Diagnosis not present

## 2018-10-14 DIAGNOSIS — Z9981 Dependence on supplemental oxygen: Secondary | ICD-10-CM | POA: Diagnosis not present

## 2018-10-14 DIAGNOSIS — I4891 Unspecified atrial fibrillation: Secondary | ICD-10-CM | POA: Diagnosis not present

## 2018-10-16 ENCOUNTER — Encounter: Payer: Self-pay | Admitting: Family Medicine

## 2018-10-16 DIAGNOSIS — I4821 Permanent atrial fibrillation: Secondary | ICD-10-CM | POA: Diagnosis not present

## 2018-10-16 DIAGNOSIS — Z7901 Long term (current) use of anticoagulants: Secondary | ICD-10-CM | POA: Diagnosis not present

## 2018-10-16 LAB — PROTIME-INR

## 2018-10-21 ENCOUNTER — Other Ambulatory Visit (HOSPITAL_COMMUNITY): Payer: Self-pay | Admitting: Internal Medicine

## 2018-10-21 DIAGNOSIS — Z5181 Encounter for therapeutic drug level monitoring: Secondary | ICD-10-CM | POA: Diagnosis not present

## 2018-10-21 DIAGNOSIS — I4891 Unspecified atrial fibrillation: Secondary | ICD-10-CM | POA: Diagnosis not present

## 2018-10-21 DIAGNOSIS — Z9981 Dependence on supplemental oxygen: Secondary | ICD-10-CM | POA: Diagnosis not present

## 2018-10-21 DIAGNOSIS — E119 Type 2 diabetes mellitus without complications: Secondary | ICD-10-CM | POA: Diagnosis not present

## 2018-10-21 DIAGNOSIS — I5033 Acute on chronic diastolic (congestive) heart failure: Secondary | ICD-10-CM | POA: Diagnosis not present

## 2018-10-21 DIAGNOSIS — Z7901 Long term (current) use of anticoagulants: Secondary | ICD-10-CM | POA: Diagnosis not present

## 2018-10-22 ENCOUNTER — Ambulatory Visit (INDEPENDENT_AMBULATORY_CARE_PROVIDER_SITE_OTHER): Payer: Medicare Other | Admitting: Emergency Medicine

## 2018-10-22 ENCOUNTER — Encounter: Payer: Self-pay | Admitting: Emergency Medicine

## 2018-10-22 VITALS — BP 142/92 | HR 84 | Ht 64.0 in | Wt 192.0 lb

## 2018-10-22 DIAGNOSIS — I2729 Other secondary pulmonary hypertension: Secondary | ICD-10-CM

## 2018-10-22 DIAGNOSIS — R0600 Dyspnea, unspecified: Secondary | ICD-10-CM | POA: Diagnosis not present

## 2018-10-22 DIAGNOSIS — I2510 Atherosclerotic heart disease of native coronary artery without angina pectoris: Secondary | ICD-10-CM | POA: Diagnosis not present

## 2018-10-22 DIAGNOSIS — J9691 Respiratory failure, unspecified with hypoxia: Secondary | ICD-10-CM

## 2018-10-22 NOTE — Patient Instructions (Signed)
Walking oximetry today on room air. Please continue your cardiac medications as prescribed. We will perform full pulmonary function testing at your next office visit. We will arrange for a sleep study to evaluate your overnight oxygen levels and for possible sleep apnea. Follow with Dr Lamonte Sakai next available after your sleep study with full PFT on the same day

## 2018-10-22 NOTE — Assessment & Plan Note (Addendum)
Unclear whether she still needs oxygen.  It was started when she was acutely ill in November.  We will try to requalify her today.  If she does not qualify then I will stop it.  She will need a sleep study to confirm the presence or absence of nocturnal hypoxemia and sleep apnea.  We will arrange for full PFT.

## 2018-10-22 NOTE — Progress Notes (Signed)
Subjective:    Patient ID: Rebekah Stewart, female    DOB: Jan 31, 1935, 84 y.o.   MRN: [id number]  HPI Pleasant 84 year old woman, never smoker, with a history of coronary disease, hypertension, atrial fibrillation and diastolic dysfunction.  She is on anticoagulation with Coumadin.  She was admitted for diastolic CHF exacerbation in November 2019, was found to have associated secondary pulmonary hypertension with a PASP of 59 mmHg (possibly overestimated with moderate TR).  She was apparently started on oxygen during that hospitalization.  Remains on 2 L/min pulsed.   She has not experienced any dyspnea since d/c from hospital. Edema and wt are stable. She denies any cough, sometimes hears UA noise, no real wheeze. No real mucous. She snores, has been seen gasping, ? Witnessed apneas.   Her daughter-in-law is here with her today.  Tells me that she may have had some water damage and mold exposure in her home in [place].  She is not living in that home currently, is here with family in [place].  The patient is interested in possibly getting off oxygen if possible.  She is interested in assisting with early voting and I do not see any reason why she cannot do that since it is nonstrenuous.  Her daughter-in-law also asked about safety driving, etc.  I have indicated that I do not see a breathing reason why she cannot drive but that this decision making is a multifactorial issue.  She needs to be okay to drive in every regard.    Review of Systems  Constitutional: Positive for unexpected weight change. Negative for fever.  HENT: Negative for congestion, dental problem, ear pain, nosebleeds, postnasal drip, rhinorrhea, sinus pressure, sneezing, sore throat and trouble swallowing.   Eyes: Negative for redness and itching.  Respiratory: Positive for shortness of breath. Negative for cough, chest tightness and wheezing.   Cardiovascular: Positive for chest pain and palpitations. Negative for leg  swelling.  Gastrointestinal: Negative for nausea and vomiting.  Genitourinary: Negative for dysuria.  Musculoskeletal: Negative for joint swelling.  Skin: Negative for rash.  Neurological: Negative for headaches.  Hematological: Does not bruise/bleed easily.  Psychiatric/Behavioral: Negative for dysphoric mood. The patient is not nervous/anxious.     Past Medical History:  Diagnosis Date  . Arthritis of back    lumbar  . Atrial fibrillation ([place]) 2010  . Atrial fibrillation ([place])   . Atrial fibrillation ([place])   . Chronic diastolic heart failure ([place])   . Colonic polyp   . Compression fracture of thoracic vertebra (HCC)   . Congestive heart failure, unspecified   . Coronary artery disease    mild non obstructive disease per cardiac cath in 2010  . Degeneration of lumbar or lumbosacral intervertebral disc   . Diverticulosis   . DM2 (diabetes mellitus, type 2) ([place])   . Hemorrhoids   . HLD (hyperlipidemia)   . HTN (hypertension)    diagnosed when she was in her 66s. Refractory. No RAS by [place] in 2010  . Long-term (current) use of anticoagulants   . Lumbosacral spondylosis without myelopathy   . Osteoarthrosis, unspecified whether generalized or localized, unspecified site   . Pain in thoracic spine   . Swelling of limb   . UTI (lower urinary tract infection)   . Varicose veins of lower extremities with other complications   . Vitamin D deficiency      Family History  Problem Relation Age of Onset  . Stroke Mother   . Heart disease Mother   .  Heart attack Father   . Heart disease Father   . Heart disease Other   . Cancer Sister   . Heart disease Brother      Social History   Socioeconomic History  . Marital status: Widowed    Spouse name: Not on file  . Number of children: Not on file  . Years of education: Not on file  . Highest education level: Not on file  Occupational History  . Occupation: retired  Scientific laboratory technician  . Financial resource strain: Not on file  .  Food insecurity:    Worry: Not on file    Inability: Not on file  . Transportation needs:    Medical: Not on file    Non-medical: Not on file  Tobacco Use  . Smoking status: Never Smoker  . Smokeless tobacco: Never Used  Substance and Sexual Activity  . Alcohol use: No  . Drug use: No  . Sexual activity: Not Currently  Lifestyle  . Physical activity:    Days per week: Not on file    Minutes per session: Not on file  . Stress: Not on file  Relationships  . Social connections:    Talks on phone: Not on file    Gets together: Not on file    Attends religious service: Not on file    Active member of club or organization: Not on file    Attends meetings of clubs or organizations: Not on file    Relationship status: Not on file  . Intimate partner violence:    Fear of current or ex partner: Not on file    Emotionally abused: Not on file    Physically abused: Not on file    Forced sexual activity: Not on file  Other Topics Concern  . Not on file  Social History Narrative  . Not on file     Allergies  Allergen Reactions  . Bee Venom Shortness Of Breath and Swelling  . Iodinated Diagnostic Agents Itching  . Penicillins Swelling  . Tape     Pulls the skin     Outpatient Medications Prior to Visit  Medication Sig Dispense Refill  . allopurinol (ZYLOPRIM) 300 MG tablet Take 1 tablet (300 mg total) by mouth daily as needed (for gout). 30 tablet 1  . atorvastatin (LIPITOR) 40 MG tablet TAKE 1 TABLET BY MOUTH EVERY DAY 30 tablet 1  . benazepril (LOTENSIN) 20 MG tablet Take 1 tablet (20 mg total) by mouth daily. 30 tablet 1  . carvedilol (COREG) 25 MG tablet Take 1 tablet (25 mg total) by mouth 2 (two) times daily with a meal. 60 tablet 1  . Cholecalciferol (VITAMIN D) 2000 UNITS CAPS Take 1 capsule by mouth daily.    . fish oil-omega-3 fatty acids 1000 MG capsule Take 2 g by mouth 2 (two) times daily.     . FUROSEMIDE PO Take by mouth. Alternate 67m and 446mdaily     .  glucose blood (ACCU-CHEK AVIVA PLUS) test strip Check two times daily. Dx code E11.65 100 each 0  . isosorbide mononitrate (IMDUR) 60 MG 24 hr tablet Take 1 tablet (60 mg total) by mouth daily. 30 tablet 1  . metFORMIN (GLUCOPHAGE) 500 MG tablet Take 1 tablet (500 mg total) by mouth 3 (three) times daily with meals. 90 tablet 1  . nystatin (MYCOSTATIN/NYSTOP) powder Apply topically 2 (two) times daily. 15 g 0  . potassium chloride (K-DUR,KLOR-CON) 10 MEQ tablet Take 1 tablet (10 mEq total)  by mouth daily.    Marland Kitchen triamcinolone cream (KENALOG) 0.1 % Apply 1 application topically 2 (two) times daily.    Marland Kitchen warfarin (COUMADIN) 4 MG tablet Take 1 tablet (4 mg total) by mouth one time only at 6 PM. Use as directed. 30 tablet 1   No facility-administered medications prior to visit.         Objective:   Physical Exam Vitals:   10/22/18 1409  BP: (!) 142/92  Pulse: 84  SpO2: 98%  Weight: 87.1 kg  Height: _0  (1.626 m)   Gen: Pleasant, overwt elderly woman, in no distress,  normal affect  ENT: No lesions,  mouth clear,  oropharynx clear, no postnasal drip  Neck: No JVD, no stridor  Lungs: No use of accessory muscles, no crackles or wheezing on normal respiration, no wheeze on forced expiration  Cardiovascular: irregular, heart sounds normal, no murmur or gallops, no peripheral edema, compression hose on   Musculoskeletal: No deformities, no cyanosis or clubbing  Neuro: alert, awake, non focal  Skin: Warm, no lesions or rash      Assessment & Plan:  Pulmonary hypertension assoc with unclear multi-factorial mechanisms (HCC) Multifactorial secondary pulmonary hypertension, principally due to her left-sided heart disease (hypertension, atrial fibrillation, etc.).  Based on history she may have some sleep apnea and this should probably be investigated.  We will confirm whether she has daytime desaturations today with a walking oximetry.  It is possible that her hypoxemia in November  occurred only when she was acutely ill and that she does not need oxygen anymore.  Respiratory failure with hypoxia ([place]) Unclear whether she still needs oxygen.  It was started when she was acutely ill in November.  We will try to requalify her today.  If she does not qualify then I will stop it.  She will need a sleep study to confirm the presence or absence of nocturnal hypoxemia and sleep apnea.  We will arrange for full PFT.   Baltazar Apo, MD, PhD 10/22/2018, 2:48 PM [facility] [phone] or if no answer [phone]

## 2018-10-22 NOTE — Assessment & Plan Note (Signed)
Multifactorial secondary pulmonary hypertension, principally due to her left-sided heart disease (hypertension, atrial fibrillation, etc.).  Based on history she may have some sleep apnea and this should probably be investigated.  We will confirm whether she has daytime desaturations today with a walking oximetry.  It is possible that her hypoxemia in November occurred only when she was acutely ill and that she does not need oxygen anymore.

## 2018-10-23 ENCOUNTER — Ambulatory Visit (HOSPITAL_BASED_OUTPATIENT_CLINIC_OR_DEPARTMENT_OTHER): Payer: Medicare Other | Attending: Emergency Medicine | Admitting: Pulmonary Disease

## 2018-10-23 DIAGNOSIS — G4730 Sleep apnea, unspecified: Secondary | ICD-10-CM | POA: Insufficient documentation

## 2018-10-23 DIAGNOSIS — J9691 Respiratory failure, unspecified with hypoxia: Secondary | ICD-10-CM | POA: Insufficient documentation

## 2018-10-23 NOTE — Progress Notes (Signed)
Thanks Dr Lamonte Sakai!  Lurena Joiner

## 2018-10-28 ENCOUNTER — Ambulatory Visit: Payer: Medicare Other | Admitting: Sports Medicine

## 2018-10-28 DIAGNOSIS — I5033 Acute on chronic diastolic (congestive) heart failure: Secondary | ICD-10-CM | POA: Diagnosis not present

## 2018-10-28 DIAGNOSIS — Z9981 Dependence on supplemental oxygen: Secondary | ICD-10-CM | POA: Diagnosis not present

## 2018-10-28 DIAGNOSIS — Z7901 Long term (current) use of anticoagulants: Secondary | ICD-10-CM | POA: Diagnosis not present

## 2018-10-28 DIAGNOSIS — I4891 Unspecified atrial fibrillation: Secondary | ICD-10-CM | POA: Diagnosis not present

## 2018-10-28 DIAGNOSIS — E119 Type 2 diabetes mellitus without complications: Secondary | ICD-10-CM | POA: Diagnosis not present

## 2018-10-28 DIAGNOSIS — Z5181 Encounter for therapeutic drug level monitoring: Secondary | ICD-10-CM | POA: Diagnosis not present

## 2018-10-28 LAB — PROTIME-INR

## 2018-10-29 ENCOUNTER — Other Ambulatory Visit (HOSPITAL_COMMUNITY): Payer: Self-pay | Admitting: Internal Medicine

## 2018-10-31 DIAGNOSIS — G4733 Obstructive sleep apnea (adult) (pediatric): Secondary | ICD-10-CM | POA: Diagnosis not present

## 2018-10-31 NOTE — Procedures (Signed)
Patient Name: Rebekah Stewart, Rebekah Stewart Date: 10/23/2018 Gender: Female D.O.B: 1934/11/28 Age (years): 89 Referring Provider: Baltazar Apo Height (inches): 22 Interpreting Physician: Kara Mead MD, ABSM Weight (lbs): 192 RPSGT: Earney [place] BMI: 33 MRN: [id number] Neck Size: 16.50 <br> <br> CLINICAL INFORMATION Sleep Study Type: NPSG    Indication for sleep study: Congestive Heart Failure, Snoring, associated pulmonary hypertension, hypoxia    Epworth Sleepiness Score: 10    SLEEP STUDY TECHNIQUE As per the AASM Manual for the Scoring of Sleep and Associated Events v2.3 (April 2016) with a hypopnea requiring 4% desaturations.  The channels recorded and monitored were frontal, central and occipital EEG, electrooculogram (EOG), submentalis EMG (chin), nasal and oral airflow, thoracic and abdominal wall motion, anterior tibialis EMG, snore microphone, electrocardiogram, and pulse oximetry.  MEDICATIONS Medications self-administered by patient taken the night of the study : N/A  SLEEP ARCHITECTURE The study was initiated at 9:36:12 PM and ended at 5:23:53 AM.  Sleep onset time was 14.4 minutes and the sleep efficiency was 77.5%%. The total sleep time was 362.3 minutes.  Stage REM latency was 75.5 minutes.  The patient spent 1.5%% of the night in stage N1 sleep, 79.7%% in stage N2 sleep, 0.0%% in stage N3 and 18.8% in REM.  Alpha intrusion was absent.  Supine sleep was 91.86%.  RESPIRATORY PARAMETERS The overall apnea/hypopnea index (AHI) was 19.0 per hour. There were 0 total apneas, including 0 obstructive, 0 central and 0 mixed apneas. There were 115 hypopneas and 36 RERAs.  The AHI during Stage REM sleep was 43.2 per hour.  AHI while supine was 18.4 per hour.  The mean oxygen saturation was 87.8%. The minimum SpO2 during sleep was 66.0%. Severe desaturations during REM sleep with & without events.  moderate snoring was noted during this study.  CARDIAC DATA The  2 lead EKG demonstrated sinus rhythm. The mean heart rate was 78.0 beats per minute. Other EKG findings include: PVCs.   LEG MOVEMENT DATA The total PLMS were 0 with a resulting PLMS index of 0.0. Associated arousal with leg movement index was 12.3 .  IMPRESSIONS - Moderate obstructive sleep apnea occurred during this study (AHI = 19.0/h). - No significant central sleep apnea occurred during this study (CAI = 0.0/h). - Severe oxygen desaturation was noted during this study (Min O2 = 66.0%). - The patient snored with moderate snoring volume. - EKG findings include PVCs & known atrial fibrillation - Clinically significant periodic limb movements did not occur during sleep. Isolated limb movements & Associated arousals were significant.   DIAGNOSIS - Obstructive Sleep Apnea (327.23 [G47.33 ICD-10]) - Periodic Limb Movement During Sleep (327.51 [G47.61 ICD-10]) - Nocturnal Hypoxemia (327.26 [G47.36 ICD-10])   RECOMMENDATIONS - Therapeutic CPAP titration to determine optimal pressure required to alleviate sleep disordered breathing. - treat limb movements only if symptomatic and not corrected by CPAP - Avoid alcohol, sedatives and other CNS depressants that may worsen sleep apnea and disrupt normal sleep architecture. - Sleep hygiene should be reviewed to assess factors that may improve sleep quality. - Weight management and regular exercise should be initiated or continued if appropriate.   Kara Mead MD Board Certified in [place]

## 2018-11-04 ENCOUNTER — Telehealth: Payer: Self-pay | Admitting: Cardiovascular Disease

## 2018-11-04 ENCOUNTER — Telehealth: Payer: Self-pay

## 2018-11-04 DIAGNOSIS — E119 Type 2 diabetes mellitus without complications: Secondary | ICD-10-CM | POA: Diagnosis not present

## 2018-11-04 DIAGNOSIS — I5033 Acute on chronic diastolic (congestive) heart failure: Secondary | ICD-10-CM | POA: Diagnosis not present

## 2018-11-04 DIAGNOSIS — Z9981 Dependence on supplemental oxygen: Secondary | ICD-10-CM | POA: Diagnosis not present

## 2018-11-04 DIAGNOSIS — Z5181 Encounter for therapeutic drug level monitoring: Secondary | ICD-10-CM | POA: Diagnosis not present

## 2018-11-04 DIAGNOSIS — I4891 Unspecified atrial fibrillation: Secondary | ICD-10-CM | POA: Diagnosis not present

## 2018-11-04 DIAGNOSIS — Z7901 Long term (current) use of anticoagulants: Secondary | ICD-10-CM | POA: Diagnosis not present

## 2018-11-04 NOTE — Telephone Encounter (Signed)
Author phoned pt. to assess interest in scheduling initial AWV. Author left detailed VM asking for return call if interested.

## 2018-11-04 NOTE — Telephone Encounter (Signed)
Call placed to the patient to check on her. She stated that she was feeling better than she has in a while. She has been watching her diet, maintaining a low sodium, low fat diet. She has been wearing her support hose during the day which she states has helped.  She has been advised to call back if anything is needed.

## 2018-11-04 NOTE — Telephone Encounter (Signed)
Wanted to make MD aware.  Thanks!

## 2018-11-04 NOTE — Telephone Encounter (Signed)
Nurse at [facility] calling about pt  Pt says her BP is high before meds are taken, but nurse took BP and it was ok after meds  130/70  Pt is also noticing chest heaviness over the past few days No SOB or Dizziness  Wants Dr to be aware in case symptoms continue. She will be discharged from home care next week

## 2018-11-05 ENCOUNTER — Ambulatory Visit (INDEPENDENT_AMBULATORY_CARE_PROVIDER_SITE_OTHER): Payer: Medicare Other | Admitting: Family Medicine

## 2018-11-05 ENCOUNTER — Encounter: Payer: Self-pay | Admitting: Family Medicine

## 2018-11-05 VITALS — BP 120/68 | HR 96 | Temp 97.7°F | Wt 193.0 lb

## 2018-11-05 DIAGNOSIS — E119 Type 2 diabetes mellitus without complications: Secondary | ICD-10-CM

## 2018-11-05 DIAGNOSIS — I10 Essential (primary) hypertension: Secondary | ICD-10-CM

## 2018-11-05 DIAGNOSIS — I2510 Atherosclerotic heart disease of native coronary artery without angina pectoris: Secondary | ICD-10-CM | POA: Diagnosis not present

## 2018-11-05 DIAGNOSIS — I5032 Chronic diastolic (congestive) heart failure: Secondary | ICD-10-CM

## 2018-11-05 DIAGNOSIS — I2729 Other secondary pulmonary hypertension: Secondary | ICD-10-CM

## 2018-11-05 DIAGNOSIS — I482 Chronic atrial fibrillation, unspecified: Secondary | ICD-10-CM | POA: Diagnosis not present

## 2018-11-05 LAB — BASIC METABOLIC PANEL
BUN: 23 mg/dL (ref 6–23)
CALCIUM: 9.1 mg/dL (ref 8.4–10.5)
CO2: 30 mEq/L (ref 19–32)
Chloride: 101 mEq/L (ref 96–112)
Creatinine, Ser: 0.96 mg/dL (ref 0.40–1.20)
GFR: 55.73 mL/min — ABNORMAL LOW (ref >60.00)
Glucose, Bld: 105 mg/dL — ABNORMAL HIGH (ref 70–99)
Potassium: 3.4 mEq/L — ABNORMAL LOW (ref 3.5–5.1)
Sodium: 144 mEq/L (ref 135–145)

## 2018-11-05 LAB — BRAIN NATRIURETIC PEPTIDE: Pro B Natriuretic peptide (BNP): 157 pg/mL — ABNORMAL HIGH (ref 0.0–100.0)

## 2018-11-05 MED ORDER — POTASSIUM CHLORIDE CRYS ER 20 MEQ PO TBCR: 20.0000 meq | EXTENDED_RELEASE_TABLET | Freq: Every day | ORAL | 2 refills | Status: DC

## 2018-11-05 NOTE — Progress Notes (Signed)
Subjective:    Patient ID: Rebekah Stewart, female    DOB: [birth year], 84 y.o.   MRN: [id number]  No chief complaint on file.   HPI Patient was seen today for follow up.  HTN: -checking bp at home with an older omron cuff. -readings have been higher the last wk. 176/110, 193/90 -denies headaches, chest pain, changes in vision -mentions having a chest heaviness 1 day last week.  States mention this to her pulmonologist and labs were ordered and negative. -Pt states her Clive called cardiology to inform them of similar symptoms  A. fib: -Pt states her Coumadin was recently adjusted as her home INR was 2.6 but was 3 when the [facility] RN checked it. -Currently taking Coumadin 6 mg Monday Wednesday Friday, 4 mg all other days.  CHF: -Now alternating Lasix 20 mg and 40 mg every other day -Patient expresses concern about kidney function-Notes improvement in lower extremity edema. -Also wearing compression socks and elevating feet when resting. -Patient denies SOB, changes in weight  (checking daily) -Being mindful of sodium intake  Type 2 diabetes: -checking FSBS, 83-120 daily -Taking metformin 500 mg 3 times daily with meals -Denies hypoglycemia  Pulmonary hypertension: -Seen by pulmonology -Patient did well on a trial without oxygen. -No longer using O2 when ambulating -Endorses compliance with meds  Past Medical History:  Diagnosis Date  . Arthritis of back    lumbar  . Atrial fibrillation ([place]) 2010  . Atrial fibrillation ([place])   . Atrial fibrillation ([place])   . Chronic diastolic heart failure ([place])   . Colonic polyp   . Compression fracture of thoracic vertebra (HCC)   . Congestive heart failure, unspecified   . Coronary artery disease    mild non obstructive disease per cardiac cath in 2010  . Degeneration of lumbar or lumbosacral intervertebral disc   . Diverticulosis   . DM2 (diabetes mellitus, type 2) ([place])   . Hemorrhoids   . HLD (hyperlipidemia)   . HTN  (hypertension)    diagnosed when she was in her 4s. Refractory. No RAS by [place] in 2010  . Long-term (current) use of anticoagulants   . Lumbosacral spondylosis without myelopathy   . Osteoarthrosis, unspecified whether generalized or localized, unspecified site   . Pain in thoracic spine   . Swelling of limb   . UTI (lower urinary tract infection)   . Varicose veins of lower extremities with other complications   . Vitamin D deficiency     Allergies  Allergen Reactions  . Bee Venom Shortness Of Breath and Swelling  . Iodinated Diagnostic Agents Itching  . Penicillins Swelling  . Tape     Pulls the skin    ROS General: Denies fever, chills, night sweats, changes in weight, changes in appetite HEENT: Denies headaches, ear pain, changes in vision, rhinorrhea, sore throat CV: Denies CP, palpitations, SOB, orthopnea  +chest pressure- now resolved, LE edema Pulm: Denies SOB, cough, wheezing GI: Denies abdominal pain, nausea, vomiting, diarrhea, constipation GU: Denies dysuria, hematuria, frequency, vaginal discharge Msk: Denies muscle cramps, joint pains   Neuro: Denies weakness, numbness, tingling Skin: Denies rashes, bruising Psych: Denies depression, anxiety, hallucinations     Objective:    Blood pressure 120/68, pulse 96, temperature 97.7 F (36.5 C), temperature source Oral, weight 193 lb (87.5 kg), SpO2 95 %.  Gen. Pleasant, well-nourished, in no distress, normal affect   HEENT: [place]/AT, face symmetric, no scleral icterus, PERRLA, nares patent without drainage Lungs: no accessory muscle use,  CTAB, no wheezes or rales Cardiovascular: RRR, no m/r/g, 1 + pitting edema in ankles.  Wearing compression socks. indention from rolls in socks noted on pt's LEs b/l. Musculoskeletal: No deformities, no cyanosis or clubbing, normal tone Neuro:  A&Ox3, CN II-XII intact, normal gait Skin:  Warm, no lesions/ rash  Wt Readings from Last 3 Encounters:  11/05/18 193 lb (87.5 kg)  10/23/18  192 lb (87.1 kg)  10/22/18 192 lb (87.1 kg)    Lab Results  Component Value Date   WBC 10.2 08/09/2018   HGB 14.2 08/09/2018   HCT 46.0 08/09/2018   PLT 332 08/09/2018   GLUCOSE 125 (H) 08/20/2018   LDLCALC 75 03/12/2014   ALT 13 08/11/2018   AST 16 08/11/2018   NA 141 08/20/2018   K 3.4 (L) 08/20/2018   CL 99 08/20/2018   CREATININE 1.06 (H) 08/20/2018   BUN 32 (H) 08/20/2018   CO2 32 08/20/2018   TSH 2.312 08/09/2018   INR 2.4 08/26/2018   HGBA1C 6.5 (H) 08/09/2018   MICROALBUR 0.6 09/21/2014    Assessment/Plan:  Chronic atrial fibrillation -rated controlled -continue coumadin 6 mg MWF and 4 mg all other days.   -continue Imdur 60 mg, Coreg 25 mg twice daily -continue f/u with Cards.  Essential hypertension  -controlled in office.  Some elevated readings at home like 2/2 home cuff being too small for pt's arm.  Discussed getting a larger cuff -continue benazepril 20 mg, Coreg 25 mg twice daily, Imdur 60 mg - Plan: Basic metabolic panel  Pulmonary hypertension assoc with unclear multi-factorial mechanisms (HCC) -stable.  Doing well off O2 -continue to use incentive spirometer -continue f/u with Pulm -Discussed speaking with pulmonology for sleep study results.  Chronic diastolic heart failure (HCC) -continue daily weights and sodium restrictions -continue f/u with Cardiology -discussed ways to reduce LE edema. -continue lasix 20 mg and 40 mg alternating qod. -will check BNP and creatinine.  - Plan: Brain Natriuretic Peptide  Non-insulin dependent type 2 diabetes mellitus ([place]) -controlled.   -last Hgb A1C 6.5% on 08/09/18 -continue metformin 500 mg TID   F/u prn  Rx for Kdur 20 mEq sent to pharmacy as K+ was 3.4.  BNP was 157, which is an improvement for her.  Grier Mitts, MD

## 2018-11-05 NOTE — Patient Instructions (Signed)
Edema  Edema is an abnormal buildup of fluids in the body tissues and under the skin. Swelling of the legs, feet, and ankles is a common symptom that becomes more likely as you get older. Swelling is also common in looser tissues, like around the eyes. When the affected area is squeezed, the fluid may move out of that spot and leave a dent for a few moments. This dent is called pitting edema. There are many possible causes of edema. Eating too much salt (sodium) and being on your feet or sitting for a long time can cause edema in your legs, feet, and ankles. Hot weather may make edema worse. Common causes of edema include:  Heart failure.  Liver or kidney disease.  Weak leg blood vessels.  Cancer.  An injury.  Pregnancy.  Medicines.  Being obese.  Low protein levels in the blood. Edema is usually painless. Your skin may look swollen or shiny. Follow these instructions at home:  Keep the affected body part raised (elevated) above the level of your heart when you are sitting or lying down.  Do not sit still or stand for long periods of time.  Do not wear tight clothing. Do not wear garters on your upper legs.  Exercise your legs to get your circulation going. This helps to move the fluid back into your blood vessels, and it may help the swelling go down.  Wear elastic bandages or support stockings to reduce swelling as told by your health care provider.  Eat a low-salt (low-sodium) diet to reduce fluid as told by your health care provider.  Depending on the cause of your swelling, you may need to limit how much fluid you drink (fluid restriction).  Take over-the-counter and prescription medicines only as told by your health care provider. Contact a health care provider if:  Your edema does not get better with treatment.  You have heart, liver, or kidney disease and have symptoms of edema.  You have sudden and unexplained weight gain. Get help right away if:  You develop  shortness of breath or chest pain.  You cannot breathe when you lie down.  You develop pain, redness, or warmth in the swollen areas.  You have heart, liver, or kidney disease and suddenly get edema.  You have a fever and your symptoms suddenly get worse. Summary  Edema is an abnormal buildup of fluids in the body tissues and under the skin.  Eating too much salt (sodium) and being on your feet or sitting for a long time can cause edema in your legs, feet, and ankles.  Keep the affected body part raised (elevated) above the level of your heart when you are sitting or lying down. This information is not intended to replace advice given to you by your health care provider. Make sure you discuss any questions you have with your health care provider. Document Released: 09/17/2005 Document Revised: 10/20/2016 Document Reviewed: 10/20/2016 Elsevier Interactive Patient Education  2019 [employer].  How to Take Your Blood Pressure You can take your blood pressure at home with a machine. You may need to check your blood pressure at home:  To check if you have high blood pressure (hypertension).  To check your blood pressure over time.  To make sure your blood pressure medicine is working. Supplies needed: You will need a blood pressure machine, or monitor. You can buy one at a drugstore or online. When choosing one:  Choose one with an arm cuff.  Choose one  that wraps around your upper arm. Only one finger should fit between your arm and the cuff.  Do not choose one that measures your blood pressure from your wrist or finger. Your doctor can suggest a monitor. How to prepare Avoid these things for 30 minutes before checking your blood pressure:  Drinking caffeine.  Drinking alcohol.  Eating.  Smoking.  Exercising. Five minutes before checking your blood pressure:  Pee.  Sit in a dining chair. Avoid sitting in a soft couch or armchair.  Be quiet. Do not talk. How to  take your blood pressure Follow the instructions that came with your machine. If you have a digital blood pressure monitor, these may be the instructions: 1. Sit up straight. 2. Place your feet on the floor. Do not cross your ankles or legs. 3. Rest your left arm at the level of your heart. You may rest it on a table, desk, or chair. 4. Pull up your shirt sleeve. 5. Wrap the blood pressure cuff around the upper part of your left arm. The cuff should be 1 inch (2.5 cm) above your elbow. It is best to wrap the cuff around bare skin. 6. Fit the cuff snugly around your arm. You should be able to place only one finger between the cuff and your arm. 7. Put the cord inside the groove of your elbow. 8. Press the power button. 9. Sit quietly while the cuff fills with air and loses air. 10. Write down the numbers on the screen. 11. Wait 2-3 minutes and then repeat steps 1-10. What do the numbers mean? Two numbers make up your blood pressure. The first number is called systolic pressure. The second is called diastolic pressure. An example of a blood pressure reading is "120 over 80" (or 120/80). If you are an adult and do not have a medical condition, use this guide to find out if your blood pressure is normal: Normal  First number: below 120.  Second number: below 80. Elevated  First number: 120-129.  Second number: below 80. Hypertension stage 1  First number: 130-139.  Second number: 80-89. Hypertension stage 2  First number: 140 or above.  Second number: 63 or above. Your blood pressure is above normal even if only the top or bottom number is above normal. Follow these instructions at home:  Check your blood pressure as often as your doctor tells you to.  Take your monitor to your next doctor's appointment. Your doctor will: ? Make sure you are using it correctly. ? Make sure it is working right.  Make sure you understand what your blood pressure numbers should be.  Tell your  doctor if your medicines are causing side effects. Contact a doctor if:  Your blood pressure keeps being high. Get help right away if:  Your first blood pressure number is higher than 180.  Your second blood pressure number is higher than 120. This information is not intended to replace advice given to you by your health care provider. Make sure you discuss any questions you have with your health care provider. Document Released: 08/30/2008 Document Revised: 08/15/2016 Document Reviewed: 02/24/2016 Elsevier Interactive Patient Education  2019 [employer].  Managing Your Hypertension Hypertension is commonly called high blood pressure. This is when the force of your blood pressing against the walls of your arteries is too strong. Arteries are blood vessels that carry blood from your heart throughout your body. Hypertension forces the heart to work harder to pump blood, and may cause  the arteries to become narrow or stiff. Having untreated or uncontrolled hypertension can cause heart attack, stroke, kidney disease, and other problems. What are blood pressure readings? A blood pressure reading consists of a higher number over a lower number. Ideally, your blood pressure should be below 120/80. The first ("top") number is called the systolic pressure. It is a measure of the pressure in your arteries as your heart beats. The second ("bottom") number is called the diastolic pressure. It is a measure of the pressure in your arteries as the heart relaxes. What does my blood pressure reading mean? Blood pressure is classified into four stages. Based on your blood pressure reading, your health care provider may use the following stages to determine what type of treatment you need, if any. Systolic pressure and diastolic pressure are measured in a unit called mm Hg. Normal  Systolic pressure: below 676.  Diastolic pressure: below 80. Elevated  Systolic pressure: 720-947.  Diastolic pressure: below  80. Hypertension stage 1  Systolic pressure: 096-283.  Diastolic pressure: 66-29. Hypertension stage 2  Systolic pressure: 476 or above.  Diastolic pressure: 90 or above. What health risks are associated with hypertension? Managing your hypertension is an important responsibility. Uncontrolled hypertension can lead to:  A heart attack.  A stroke.  A weakened blood vessel (aneurysm).  Heart failure.  Kidney damage.  Eye damage.  Metabolic syndrome.  Memory and concentration problems. What changes can I make to manage my hypertension? Hypertension can be managed by making lifestyle changes and possibly by taking medicines. Your health care provider will help you make a plan to bring your blood pressure within a normal range. Eating and drinking   Eat a diet that is high in fiber and potassium, and low in salt (sodium), added sugar, and fat. An example eating plan is called the DASH (Dietary Approaches to Stop Hypertension) diet. To eat this way: ? Eat plenty of fresh fruits and vegetables. Try to fill half of your plate at each meal with fruits and vegetables. ? Eat whole grains, such as whole wheat pasta, brown rice, or whole grain bread. Fill about one quarter of your plate with whole grains. ? Eat low-fat diary products. ? Avoid fatty cuts of meat, processed or cured meats, and poultry with skin. Fill about one quarter of your plate with lean proteins such as fish, chicken without skin, beans, eggs, and tofu. ? Avoid premade and processed foods. These tend to be higher in sodium, added sugar, and fat.  Reduce your daily sodium intake. Most people with hypertension should eat less than 1,500 mg of sodium a day.  Limit alcohol intake to no more than 1 drink a day for nonpregnant women and 2 drinks a day for men. One drink equals 12 oz of beer, 5 oz of wine, or 1 oz of hard liquor. Lifestyle  Work with your health care provider to maintain a healthy body weight, or to  lose weight. Ask what an ideal weight is for you.  Get at least 30 minutes of exercise that causes your heart to beat faster (aerobic exercise) most days of the week. Activities may include walking, swimming, or biking.  Include exercise to strengthen your muscles (resistance exercise), such as weight lifting, as part of your weekly exercise routine. Try to do these types of exercises for 30 minutes at least 3 days a week.  Do not use any products that contain nicotine or tobacco, such as cigarettes and e-cigarettes. If you need help  quitting, ask your health care provider.  Control any long-term (chronic) conditions you have, such as high cholesterol or diabetes. Monitoring  Monitor your blood pressure at home as told by your health care provider. Your personal target blood pressure may vary depending on your medical conditions, your age, and other factors.  Have your blood pressure checked regularly, as often as told by your health care provider. Working with your health care provider  Review all the medicines you take with your health care provider because there may be side effects or interactions.  Talk with your health care provider about your diet, exercise habits, and other lifestyle factors that may be contributing to hypertension.  Visit your health care provider regularly. Your health care provider can help you create and adjust your plan for managing hypertension. Will I need medicine to control my blood pressure? Your health care provider may prescribe medicine if lifestyle changes are not enough to get your blood pressure under control, and if:  Your systolic blood pressure is 130 or higher.  Your diastolic blood pressure is 80 or higher. Take medicines only as told by your health care provider. Follow the directions carefully. Blood pressure medicines must be taken as prescribed. The medicine does not work as well when you skip doses. Skipping doses also puts you at risk for  problems. Contact a health care provider if:  You think you are having a reaction to medicines you have taken.  You have repeated (recurrent) headaches.  You feel dizzy.  You have swelling in your ankles.  You have trouble with your vision. Get help right away if:  You develop a severe headache or confusion.  You have unusual weakness or numbness, or you feel faint.  You have severe pain in your chest or abdomen.  You vomit repeatedly.  You have trouble breathing. Summary  Hypertension is when the force of blood pumping through your arteries is too strong. If this condition is not controlled, it may put you at risk for serious complications.  Your personal target blood pressure may vary depending on your medical conditions, your age, and other factors. For most people, a normal blood pressure is less than 120/80.  Hypertension is managed by lifestyle changes, medicines, or both. Lifestyle changes include weight loss, eating a healthy, low-sodium diet, exercising more, and limiting alcohol. This information is not intended to replace advice given to you by your health care provider. Make sure you discuss any questions you have with your health care provider. Document Released: 06/11/2012 Document Revised: 08/15/2016 Document Reviewed: 08/15/2016 Elsevier Interactive Patient Education  2019 [employer].

## 2018-11-07 ENCOUNTER — Other Ambulatory Visit: Payer: Self-pay

## 2018-11-07 MED ORDER — CARVEDILOL 25 MG PO TABS: 25.0000 mg | ORAL_TABLET | Freq: Two times a day (BID) | ORAL | 1 refills | Status: DC

## 2018-11-11 ENCOUNTER — Telehealth: Payer: Self-pay | Admitting: Cardiovascular Disease

## 2018-11-11 ENCOUNTER — Encounter

## 2018-11-11 ENCOUNTER — Ambulatory Visit (INDEPENDENT_AMBULATORY_CARE_PROVIDER_SITE_OTHER): Payer: Medicare Other | Admitting: Sports Medicine

## 2018-11-11 ENCOUNTER — Encounter: Payer: Self-pay | Admitting: Sports Medicine

## 2018-11-11 DIAGNOSIS — B351 Tinea unguium: Secondary | ICD-10-CM | POA: Diagnosis not present

## 2018-11-11 DIAGNOSIS — M2041 Other hammer toe(s) (acquired), right foot: Secondary | ICD-10-CM

## 2018-11-11 DIAGNOSIS — I4891 Unspecified atrial fibrillation: Secondary | ICD-10-CM | POA: Diagnosis not present

## 2018-11-11 DIAGNOSIS — I5033 Acute on chronic diastolic (congestive) heart failure: Secondary | ICD-10-CM | POA: Diagnosis not present

## 2018-11-11 DIAGNOSIS — M79675 Pain in left toe(s): Secondary | ICD-10-CM | POA: Diagnosis not present

## 2018-11-11 DIAGNOSIS — M2010 Hallux valgus (acquired), unspecified foot: Secondary | ICD-10-CM

## 2018-11-11 DIAGNOSIS — M2042 Other hammer toe(s) (acquired), left foot: Secondary | ICD-10-CM

## 2018-11-11 DIAGNOSIS — I739 Peripheral vascular disease, unspecified: Secondary | ICD-10-CM

## 2018-11-11 DIAGNOSIS — Z7901 Long term (current) use of anticoagulants: Secondary | ICD-10-CM | POA: Diagnosis not present

## 2018-11-11 DIAGNOSIS — M2140 Flat foot [pes planus] (acquired), unspecified foot: Secondary | ICD-10-CM

## 2018-11-11 DIAGNOSIS — E119 Type 2 diabetes mellitus without complications: Secondary | ICD-10-CM | POA: Diagnosis not present

## 2018-11-11 DIAGNOSIS — Z5181 Encounter for therapeutic drug level monitoring: Secondary | ICD-10-CM | POA: Diagnosis not present

## 2018-11-11 DIAGNOSIS — M79674 Pain in right toe(s): Secondary | ICD-10-CM | POA: Diagnosis not present

## 2018-11-11 DIAGNOSIS — E1140 Type 2 diabetes mellitus with diabetic neuropathy, unspecified: Secondary | ICD-10-CM

## 2018-11-11 DIAGNOSIS — Z9981 Dependence on supplemental oxygen: Secondary | ICD-10-CM | POA: Diagnosis not present

## 2018-11-11 NOTE — Telephone Encounter (Signed)
Records received from [facility] on 11/11/18, Appt 12/23/18 @ 10:00AM.[place]

## 2018-11-11 NOTE — Progress Notes (Signed)
Subjective: Rebekah Stewart is a 84 y.o. female patient with history of diabetes who returns to office today complaining of long, painful nails while ambulating in shoes; unable to trim. Patient states that the glucose reading this morning was not recorded, last A1c recorded at 6.2 and she saw her primary care Dr. Delena Stewart back in [place] but now living in [place] in daughter after dealing with CHF issues but hopes to go back home soon to [place].  Patient denies any other pedal complaints.   Patient Active Problem List   Diagnosis Date Noted  . Respiratory failure with hypoxia ([place]) 10/10/2018  . Chronic anticoagulation 08/19/2018  . Non-insulin dependent type 2 diabetes mellitus ([place]) 08/13/2018  . Intertrigo 08/10/2018  . Pulmonary hypertension assoc with unclear multi-factorial mechanisms ([place])   . Acute on chronic diastolic heart failure ([place])   . Flash pulmonary edema ([place]) 08/09/2018  . Varicose veins of lower extremities with complications, bilateral 10/30/2016  . Dyslipidemia 05/21/2014  . Bilateral leg pain 11/26/2012  . Chronic diastolic heart failure ([place])   . Chronic atrial fibrillation   . Essential hypertension   . Coronary artery disease    Current Outpatient Medications on File Prior to Visit  Medication Sig Dispense Refill  . allopurinol (ZYLOPRIM) 300 MG tablet Take 1 tablet (300 mg total) by mouth daily as needed (for gout). 30 tablet 1  . atorvastatin (LIPITOR) 40 MG tablet TAKE 1 TABLET BY MOUTH EVERY DAY 30 tablet 1  . benazepril (LOTENSIN) 20 MG tablet Take 1 tablet (20 mg total) by mouth daily. 30 tablet 1  . carvedilol (COREG) 25 MG tablet Take 1 tablet (25 mg total) by mouth 2 (two) times daily with a meal. 60 tablet 1  . Cholecalciferol (VITAMIN D) 2000 UNITS CAPS Take 1 capsule by mouth daily.    . fish oil-omega-3 fatty acids 1000 MG capsule Take 2 g by mouth 2 (two) times daily.     . FUROSEMIDE PO Take by mouth. Alternate 69m and 455mdaily     .  glucose blood (ACCU-CHEK AVIVA PLUS) test strip Check two times daily. Dx code E11.65 100 each 0  . isosorbide mononitrate (IMDUR) 60 MG 24 hr tablet Take 1 tablet (60 mg total) by mouth daily. 30 tablet 1  . metFORMIN (GLUCOPHAGE) 500 MG tablet Take 1 tablet (500 mg total) by mouth 3 (three) times daily with meals. 90 tablet 1  . nystatin (MYCOSTATIN/NYSTOP) powder Apply topically 2 (two) times daily. 15 g 0  . potassium chloride SA (K-DUR,KLOR-CON) 20 MEQ tablet Take 1 tablet (20 mEq total) by mouth daily. 90 tablet 2  . triamcinolone cream (KENALOG) 0.1 % Apply 1 application topically as needed.     . warfarin (COUMADIN) 4 MG tablet Take 1 tablet (4 mg total) by mouth one time only at 6 PM. Use as directed. 30 tablet 1   No current facility-administered medications on file prior to visit.    Allergies  Allergen Reactions  . Bee Venom Shortness Of Breath and Swelling  . Iodinated Diagnostic Agents Itching  . Penicillins Swelling  . Tape     Pulls the skin    Recent Results (from the past 2160 hour(s))  POCT INR     Status: None   Collection Time: 08/19/18 12:00 AM  Result Value Ref Range   INR 2.1 2.0 - 3.0  Basic metabolic panel     Status: Abnormal   Collection Time: 08/20/18 12:47 PM  Result Value Ref Range  Sodium 141 135 - 145 mmol/L   Potassium 3.4 (L) 3.5 - 5.1 mmol/L   Chloride 99 98 - 111 mmol/L   CO2 32 22 - 32 mmol/L   Glucose, Bld 125 (H) 70 - 99 mg/dL   BUN 32 (H) 8 - 23 mg/dL   Creatinine, Ser 1.06 (H) 0.44 - 1.00 mg/dL   Calcium 9.3 8.9 - 10.3 mg/dL   GFR calc non Af Amer 48 (L) >60 mL/min   GFR calc Af Amer 56 (L) >60 mL/min    Comment: (NOTE) The eGFR has been calculated using the CKD EPI equation. This calculation has not been validated in all clinical situations. eGFR's persistently <60 mL/min signify possible Chronic Kidney Disease.    Anion gap 10 5 - 15    Comment: Performed at [facility] [address], [place], [place] [address]  POCT  INR     Status: None   Collection Time: 08/26/18  2:50 PM  Result Value Ref Range   INR 2.4 2.0 - 3.0  Basic metabolic panel     Status: Abnormal   Collection Time: 11/05/18 11:32 AM  Result Value Ref Range   Sodium 144 135 - 145 mEq/L   Potassium 3.4 (L) 3.5 - 5.1 mEq/L   Chloride 101 96 - 112 mEq/L   CO2 30 19 - 32 mEq/L   Glucose, Bld 105 (H) 70 - 99 mg/dL   BUN 23 6 - 23 mg/dL   Creatinine, Ser 0.96 0.40 - 1.20 mg/dL   Calcium 9.1 8.4 - 10.5 mg/dL   GFR 55.73 (L) >60.00 mL/min  Brain Natriuretic Peptide     Status: Abnormal   Collection Time: 11/05/18 11:32 AM  Result Value Ref Range   Pro B Natriuretic peptide (BNP) 157.0 (H) 0.0 - 100.0 pg/mL    Objective: General: Patient is awake, alert, and oriented x 3 and in no acute distress.  Integument: Skin is warm, dry and supple bilateral. Nails are tender, long, thickened and dystrophic with subungual debris, consistent with onychomycosis, 1-5 bilateral.  No acute signs of infection. No open lesions or preulcerative lesions present bilateral. Remaining integument unremarkable.  Vasculature:  Dorsalis Pedis pulse 1/4 bilateral. Posterior Tibial pulse  0/4 bilateral.  Capillary fill time <5 sec 1-5 bilateral. Diminished hair growth to the level of the digits. Temperature gradient within normal limits.  Moderate varicosities and venous stasis hyperpigmentation with 1+ pitting edema present bilateral.   Neurology: The patient has diminished sensation measured with a 5.07/10g Semmes Weinstein Monofilament at all pedal sites bilateral . Vibratory sensation diminished bilateral with tuning fork. No Babinski sign present bilateral.   Musculoskeletal: No tenderness bilateral. Muscular strength 5/5 in all lower extremity muscular groups bilateral without pain on range of motion. No tenderness with calf compression bilateral. Asymptomatic Pes planus, bunion and hammertoes bilateral.  Assessment and Plan: Problem List Items Addressed This  Visit    None    Visit Diagnoses    Pain due to onychomycosis of toenails of both feet    -  Primary   Type 2 diabetes, controlled, with neuropathy (HCC)       PVD (peripheral vascular disease) ([place])       Acquired hallux valgus, unspecified laterality       Hammer toes of both feet       Acquired flat foot, unspecified laterality         -Examined patient. -Discussed and educated patient on diabetic foot care, especially with regards to  the vascular, neurological and musculoskeletal systems.  -Mechanically debrided all nails 1-5 bilateral using sterile nail nipper and filed with dremel without incident  -Continue with diabetic shoes  -Patient to return  in 3 months for at risk foot care  -Patient advised to call the office if any problems or questions arise in the meantime.  Landis Martins, DPM

## 2018-11-12 ENCOUNTER — Encounter: Payer: Self-pay | Admitting: Family Medicine

## 2018-11-12 DIAGNOSIS — Z7901 Long term (current) use of anticoagulants: Secondary | ICD-10-CM | POA: Diagnosis not present

## 2018-11-12 DIAGNOSIS — I4821 Permanent atrial fibrillation: Secondary | ICD-10-CM | POA: Diagnosis not present

## 2018-11-12 LAB — PROTIME-INR

## 2018-11-18 ENCOUNTER — Ambulatory Visit: Payer: Self-pay | Admitting: *Deleted

## 2018-11-18 NOTE — Telephone Encounter (Signed)
Please Advise

## 2018-11-18 NOTE — Telephone Encounter (Signed)
189/112 P 93 today- this morning- patient states her BP has been fluctuating and she is concerned that it is so high. Unable to recheck- she is not at home. 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 Advised UC- and patient will not go. She states the only time she is available to come this week is Friday at 11:30. Call to office- permission to schedule patient given- and note will be reviewed by PCP. Patient wants to know if she needs to increase her night time dosage this week before her appointment. Will let PCP review for response.  Reason for Disposition . Systolic BP  >= 073 OR Diastolic >= 710  Answer Assessment - Initial Assessment Questions 1. BLOOD PRESSURE: "What is the blood pressure?" "Did you take at least two measurements 5 minutes apart?"     189/112 2. ONSET: "When did you take your blood pressure?"     6:00 am before medications 3. HOW: "How did you obtain the blood pressure?" (e.g., visiting nurse, automatic home BP monitor)     Automatic cuff 4. HISTORY: "Do you have a history of high blood pressure?"     yes 5. MEDICATIONS: "Are you taking any medications for blood pressure?" "Have you missed any doses recently?"     On medication and no missed doses 6. OTHER SYMPTOMS: "Do you have any symptoms?" (e.g., headache, chest pain, blurred vision, difficulty breathing, weakness)     Not now- but some days she has headache 7. PREGNANCY: "Is there any chance you are pregnant?" "When was your last menstrual period?"     n/a  Protocols used: HIGH BLOOD PRESSURE-A-AH

## 2018-11-18 NOTE — Telephone Encounter (Signed)
Pt refused all recommendations to be evaluated sooner. Pt has been scheduled for OV on Friday.

## 2018-11-19 ENCOUNTER — Telehealth: Payer: Self-pay | Admitting: Emergency Medicine

## 2018-11-19 NOTE — Telephone Encounter (Signed)
Spoke with pt, advised her that Ria Comment called her but she spoke with Nira Conn about sleep study results. I also reminded her of an appt scheduled with Dr. Lamonte Sakai on 11/21/2018. She understood and nothing further is needed.

## 2018-11-20 ENCOUNTER — Encounter: Payer: Self-pay | Admitting: Family Medicine

## 2018-11-20 ENCOUNTER — Ambulatory Visit: Payer: Medicare Other | Admitting: Family Medicine

## 2018-11-20 ENCOUNTER — Ambulatory Visit: Payer: Self-pay

## 2018-11-20 ENCOUNTER — Other Ambulatory Visit: Payer: Self-pay

## 2018-11-20 ENCOUNTER — Ambulatory Visit (INDEPENDENT_AMBULATORY_CARE_PROVIDER_SITE_OTHER): Payer: Medicare Other | Admitting: Family Medicine

## 2018-11-20 VITALS — BP 130/82 | HR 85 | Temp 98.0°F | Resp 16 | Ht 64.0 in | Wt 191.2 lb

## 2018-11-20 DIAGNOSIS — H1132 Conjunctival hemorrhage, left eye: Secondary | ICD-10-CM | POA: Diagnosis not present

## 2018-11-20 DIAGNOSIS — I10 Essential (primary) hypertension: Secondary | ICD-10-CM

## 2018-11-20 DIAGNOSIS — I5032 Chronic diastolic (congestive) heart failure: Secondary | ICD-10-CM | POA: Diagnosis not present

## 2018-11-20 DIAGNOSIS — K644 Residual hemorrhoidal skin tags: Secondary | ICD-10-CM | POA: Diagnosis not present

## 2018-11-20 DIAGNOSIS — Z7901 Long term (current) use of anticoagulants: Secondary | ICD-10-CM | POA: Diagnosis not present

## 2018-11-20 DIAGNOSIS — I482 Chronic atrial fibrillation, unspecified: Secondary | ICD-10-CM | POA: Diagnosis not present

## 2018-11-20 LAB — PROTIME-INR
INR: 2.4 ratio — ABNORMAL HIGH (ref 0.8–1.0)
Prothrombin Time: 27.8 s — ABNORMAL HIGH (ref 9.6–13.1)

## 2018-11-20 NOTE — Patient Instructions (Addendum)
Please schedule follow up with Dr. Volanda Napoleon in 2 weeks to recheck your blood pressures.   I have ordered your INR today; Dr. Volanda Napoleon will adjust your medication.  Your blood pressure was normal in the office today. No changes to your mediations are recommended at this time.

## 2018-11-20 NOTE — Telephone Encounter (Signed)
Pt. Reports she has had elevated BP readings this week. Has an appointment for tomorrow with her PCP, but requesting to be seen this morning due to inclement weather. No 30 minute time slots available at [facility] this morning per [place] at the practice.Appointment made at [facility]. Has had a headache on and off this week. Saw small amount of blood in stool Monday. No more seen this week. Is on Coumadin.  Reason for Disposition . Systolic BP  >= 346 OR Diastolic >= 219  Answer Assessment - Initial Assessment Questions 1. BLOOD PRESSURE: "What is the blood pressure?" "Did you take at least two measurements 5 minutes apart?"     173/97 2. ONSET: "When did you take your blood pressure?"     This morning 3. HOW: "How did you obtain the blood pressure?" (e.g., visiting nurse, automatic home BP monitor)     Home monitor 4. HISTORY: "Do you have a history of high blood pressure?"     Yes 5. MEDICATIONS: "Are you taking any medications for blood pressure?" "Have you missed any doses recently?"     No 6. OTHER SYMPTOMS: "Do you have any symptoms?" (e.g., headache, chest pain, blurred vision, difficulty breathing, weakness)     Headache, blood in stool Monday 7. PREGNANCY: "Is there any chance you are pregnant?" "When was your last menstrual period?"     No  Protocols used: HIGH BLOOD PRESSURE-A-AH

## 2018-11-20 NOTE — Progress Notes (Signed)
Please call patient: I have reviewed his/her lab results. PT/INR is now in range. Dr. Volanda Napoleon to make any further adjustments as needed Have forwarded to PCP as well.

## 2018-11-20 NOTE — Progress Notes (Signed)
Subjective  CC:  Chief Complaint  Patient presents with  . Elevated blood pressures    She states that this week her BPs have been worse in elevation, unsure if R eye redness is due to BP elevation  . Coagulation Disorder    She states that she needs her PT/INR checked today   Same day acute visit; PCP not available. New pt to me. Chart reviewed.   HPI: Rebekah Stewart is a 84 y.o. female who presents to the office today to address the problems listed above in the chief complaint.  Hypertension f/u: 84 yo followed by cardiology and pulm and pcp - although here transiently from [place]. Home readings are consistently elevated 150s-170s/90s-100s by her home log. She is VERY concerned and thinks she needs another nighttime antihypertensive. I have reviewed recent notes; cards f/u in jan bp was 120/74.  She also had a recent follow-up with her PCP where blood pressure readings were normal in the office.  At that time, her elevated home blood pressure readings were attributed to a small blood pressure cuff.  Patient is very anxious about having a stroke.  She was recently hospitalized for CHF exacerbation.  She denies symptoms of CHF: Specifically no shortness of breath, PND, orthopnea, lower extremity edema or weight gain.  She checks her weight daily.  She is on oral anticoagulants due to chronic A. fib: She reports that they have been adjusting her medications although she is unclear on her specific dose.  Because of this now she would like her blood checked today because she is being referred to the A. fib clinic by cardiology and does not think she will be able to get there tomorrow.  She has a sub-conjunctival hemorrhage on the left and she did have some hemorrhoidal bleeding yesterday without melena.  She is concerned about this as well.  Review of systems negative for chest pain, palpitations, abdominal pain, fever, nausea, vomiting, diarrhea. Lab Results  Component Value Date   CREATININE 0.96 11/05/2018   BUN 23 11/05/2018   NA 144 11/05/2018   K 3.4 (L) 11/05/2018   CL 101 11/05/2018   CO2 30 11/05/2018   Lab Results  Component Value Date   INR 2.4 08/26/2018   INR 2.1 08/19/2018   INR 1.85 08/13/2018   BP Readings from Last 3 Encounters:  11/20/18 130/82  11/05/18 120/68  10/22/18 (!) 142/92    Assessment  1. Essential hypertension   2. Chronic anticoagulation   3. Chronic atrial fibrillation   4. Chronic diastolic heart failure ([place])   5. Subconjunctival hemorrhage, left   6. External hemorrhoid, bleeding      Plan    Hypertension f/u: BP control is fairly well controlled by readings done in the office.  We did check her home blood pressure cuff against our reading today and it was the same.  I suspect there is a white coat or anxiety component to her home readings, but will defer further management of her blood pressure to her PCP and cardiologist.  Counseling done.  Patient did not wholeheartedly agree with my plan but will follow-up with her PCP accordingly.  Anticoagulation f/u: We will recheck the INR here in the office today and forward to PCP for further adjustment of her Coumadin dosage as I am unclear of the most recent changes.  Patient understands and agrees.  Some conjunctival hemorrhage and hemorrhoidal bleeding:, As above check INR to ensure its not out of range.  Reassured, not  related to retinal hemorrhage.  Heart failure: Patient appears euvolemic today. Education regarding management of these chronic disease states was given. Management strategies discussed on successive visits include dietary and exercise recommendations, goals of achieving and maintaining IBW, and lifestyle modifications aiming for adequate sleep and minimizing stressors.   Follow up: No follow-ups on file.  Orders Placed This Encounter  Procedures  . Protime-INR   No orders of the defined types were placed in this encounter.     BP Readings from  Last 3 Encounters:  11/20/18 130/82  11/05/18 120/68  10/22/18 (!) 142/92   Wt Readings from Last 3 Encounters:  11/20/18 191 lb 3.2 oz (86.7 kg)  11/05/18 193 lb (87.5 kg)  10/23/18 192 lb (87.1 kg)    No results found for: CHOL No results found for: HDL Lab Results  Component Value Date   LDLCALC 75 03/12/2014   No results found for: TRIG No results found for: CHOLHDL No results found for: LDLDIRECT Lab Results  Component Value Date   CREATININE 0.96 11/05/2018   BUN 23 11/05/2018   NA 144 11/05/2018   K 3.4 (L) 11/05/2018   CL 101 11/05/2018   CO2 30 11/05/2018    The ASCVD Risk score (Goff DC Jr., et al., 2013) failed to calculate for the following reasons:   The 2013 ASCVD risk score is only valid for ages 19 to 5  I reviewed the patients updated PMH, FH, and SocHx.    Patient Active Problem List   Diagnosis Date Noted  . Respiratory failure with hypoxia ([place]) 10/10/2018  . Chronic anticoagulation 08/19/2018  . Non-insulin dependent type 2 diabetes mellitus ([place]) 08/13/2018  . Intertrigo 08/10/2018  . Pulmonary hypertension assoc with unclear multi-factorial mechanisms ([place])   . Acute on chronic diastolic heart failure ([place])   . Flash pulmonary edema ([place]) 08/09/2018  . Varicose veins of lower extremities with complications, bilateral 10/30/2016  . Dyslipidemia 05/21/2014  . Bilateral leg pain 11/26/2012  . Chronic diastolic heart failure ([place])   . Chronic atrial fibrillation   . Essential hypertension   . Coronary artery disease     Allergies: Bee venom; Iodinated diagnostic agents; Penicillins; and Tape  Social History: Patient  reports that she has never smoked. She has never used smokeless tobacco. She reports that she does not drink alcohol or use drugs.  Current Meds  Medication Sig  . allopurinol (ZYLOPRIM) 300 MG tablet Take 1 tablet (300 mg total) by mouth daily as needed (for gout).  Marland Kitchen atorvastatin (LIPITOR) 40 MG tablet TAKE 1 TABLET BY  MOUTH EVERY DAY  . benazepril (LOTENSIN) 20 MG tablet Take 1 tablet (20 mg total) by mouth daily.  . carvedilol (COREG) 25 MG tablet Take 1 tablet (25 mg total) by mouth 2 (two) times daily with a meal.  . Cholecalciferol (VITAMIN D) 2000 UNITS CAPS Take 1 capsule by mouth daily.  . fish oil-omega-3 fatty acids 1000 MG capsule Take 2 g by mouth 2 (two) times daily.   . FUROSEMIDE PO Take by mouth. Alternate 75m and 476mdaily   . glucose blood (ACCU-CHEK AVIVA PLUS) test strip Check two times daily. Dx code E11.65  . isosorbide mononitrate (IMDUR) 60 MG 24 hr tablet Take 1 tablet (60 mg total) by mouth daily.  . metFORMIN (GLUCOPHAGE) 500 MG tablet Take 1 tablet (500 mg total) by mouth 3 (three) times daily with meals.  . nystatin (MYCOSTATIN/NYSTOP) powder Apply topically 2 (two) times daily. (Patient taking differently: Apply  topically as needed. )  . potassium chloride SA (K-DUR,KLOR-CON) 20 MEQ tablet Take 1 tablet (20 mEq total) by mouth daily. (Patient taking differently: Take 20 mEq by mouth daily. Pt stated, "I am to take a 1/2 tablet now per my doctor")  . triamcinolone cream (KENALOG) 0.1 % Apply 1 application topically as needed.   . warfarin (COUMADIN) 4 MG tablet Take 1 tablet (4 mg total) by mouth one time only at 6 PM. Use as directed.    Review of Systems: Cardiovascular: negative for chest pain, palpitations, leg swelling, orthopnea Respiratory: negative for SOB, wheezing or persistent cough Gastrointestinal: negative for abdominal pain Genitourinary: negative for dysuria or gross hematuria  Objective  Vitals: BP 130/82   Pulse 85   Temp 98 F (36.7 C) (Oral)   Resp 16   Ht _0  (1.626 m)   Wt 191 lb 3.2 oz (86.7 kg)   SpO2 97%   BMI 32.82 kg/m  General: no acute distress  Psych:  Alert and oriented, very anxious mood and affect HEENT:  Normocephalic, atraumatic, supple neck, left medial subconjunctival hemorrhage present Cardiovascular: Irregularly  irregular Respiratory:  Good breath sounds bilaterally, CTAB with normal respiratory effort, no rales Extremity: No peripheral edema Skin:  Warm, no rashes Neurologic:   Mental status is normal  Commons side effects, risks, benefits, and alternatives for medications and treatment plan prescribed today were discussed, and the patient expressed understanding of the given instructions. Patient is instructed to call or message via MyChart if he/she has any questions or concerns regarding our treatment plan. No barriers to understanding were identified. We discussed Red Flag symptoms and signs in detail. Patient expressed understanding regarding what to do in case of urgent or emergency type symptoms.   Medication list was reconciled, printed and provided to the patient in AVS. Patient instructions and summary information was reviewed with the patient as documented in the AVS. This note was prepared with assistance of Dragon voice recognition software. Occasional wrong-word or sound-a-like substitutions may have occurred due to the inherent limitations of voice recognition software

## 2018-11-21 ENCOUNTER — Ambulatory Visit: Payer: Medicare Other | Admitting: Emergency Medicine

## 2018-11-21 ENCOUNTER — Ambulatory Visit: Payer: Medicare Other | Admitting: Family Medicine

## 2018-11-26 LAB — PROTIME-INR

## 2018-11-29 ENCOUNTER — Other Ambulatory Visit: Payer: Self-pay | Admitting: Family Medicine

## 2018-12-01 ENCOUNTER — Ambulatory Visit (INDEPENDENT_AMBULATORY_CARE_PROVIDER_SITE_OTHER): Payer: Medicare Other | Admitting: Emergency Medicine

## 2018-12-01 ENCOUNTER — Encounter: Payer: Self-pay | Admitting: Emergency Medicine

## 2018-12-01 DIAGNOSIS — J9691 Respiratory failure, unspecified with hypoxia: Secondary | ICD-10-CM | POA: Diagnosis not present

## 2018-12-01 DIAGNOSIS — I2729 Other secondary pulmonary hypertension: Secondary | ICD-10-CM | POA: Diagnosis not present

## 2018-12-01 DIAGNOSIS — I2510 Atherosclerotic heart disease of native coronary artery without angina pectoris: Secondary | ICD-10-CM | POA: Diagnosis not present

## 2018-12-01 DIAGNOSIS — R0600 Dyspnea, unspecified: Secondary | ICD-10-CM | POA: Diagnosis not present

## 2018-12-01 NOTE — Assessment & Plan Note (Signed)
Oxygenation improved post hospitalization for CHF.  If she is agreeable we can discontinue her supplemental oxygen.

## 2018-12-01 NOTE — Progress Notes (Signed)
Subjective:    Patient ID: Rebekah Stewart, female    DOB: Nov 12, 1934, 84 y.o.   MRN: [id number]  HPI Pleasant 84 year old woman, never smoker, with a history of coronary disease, hypertension, atrial fibrillation and diastolic dysfunction.  She is on anticoagulation with Coumadin.  She was admitted for diastolic CHF exacerbation in November 2019, was found to have associated secondary pulmonary hypertension with a PASP of 59 mmHg (possibly overestimated with moderate TR).  She was apparently started on oxygen during that hospitalization.  Remains on 2 L/min pulsed.   She has not experienced any dyspnea since d/c from hospital. Edema and wt are stable. She denies any cough, sometimes hears UA noise, no real wheeze. No real mucous. She snores, has been seen gasping, ? Witnessed apneas.   Her daughter-in-law is here with her today.  Tells me that she may have had some water damage and mold exposure in her home in [place].  She is not living in that home currently, is here with family in [place].  The patient is interested in possibly getting off oxygen if possible.  She is interested in assisting with early voting and I do not see any reason why she cannot do that since it is nonstrenuous.  Her daughter-in-law also asked about safety driving, etc.  I have indicated that I do not see a breathing reason why she cannot drive but that this decision making is a multifactorial issue.  She needs to be okay to drive in every regard.  ROV 12/01/2018 --Rebekah Stewart has left-sided heart disease as described above, secondary pulmonary hypertension (pressure estimation question impacted by moderate TR).  She has documented hypoxemia that was identified while she was acutely ill with AE-CHF.  She did not desaturate at our last visit.  She underwent pulmonary function testing today which I reviewed.  This shows evidence for restriction with possible superimposed obstruction.  Lung volumes are normal, diffusion capacity  decreased but corrects to the normal range when adjusted for alveolar volume.  She had a sleep study done on 1/23 that shows moderate obstructive sleep apnea, no central apneas and severe nocturnal desaturation to 66%. She believes that her breathing is a bit better than last visit.     Review of Systems  Constitutional: Positive for unexpected weight change. Negative for fever.  HENT: Negative for congestion, dental problem, ear pain, nosebleeds, postnasal drip, rhinorrhea, sinus pressure, sneezing, sore throat and trouble swallowing.   Eyes: Negative for redness and itching.  Respiratory: Positive for shortness of breath. Negative for cough, chest tightness and wheezing.   Cardiovascular: Positive for chest pain and palpitations. Negative for leg swelling.  Gastrointestinal: Negative for nausea and vomiting.  Genitourinary: Negative for dysuria.  Musculoskeletal: Negative for joint swelling.  Skin: Negative for rash.  Neurological: Negative for headaches.  Hematological: Does not bruise/bleed easily.  Psychiatric/Behavioral: Negative for dysphoric mood. The patient is not nervous/anxious.     Past Medical History:  Diagnosis Date  . Arthritis of back    lumbar  . Atrial fibrillation ([place]) 2010  . Atrial fibrillation ([place])   . Atrial fibrillation ([place])   . Chronic diastolic heart failure ([place])   . Colonic polyp   . Compression fracture of thoracic vertebra (HCC)   . Congestive heart failure, unspecified   . Coronary artery disease    mild non obstructive disease per cardiac cath in 2010  . Degeneration of lumbar or lumbosacral intervertebral disc   . Diverticulosis   . DM2 (diabetes  mellitus, type 2) ([place])   . Hemorrhoids   . HLD (hyperlipidemia)   . HTN (hypertension)    diagnosed when she was in her 31s. Refractory. No RAS by [place] in 2010  . Long-term (current) use of anticoagulants   . Lumbosacral spondylosis without myelopathy   . Osteoarthrosis, unspecified whether  generalized or localized, unspecified site   . Pain in thoracic spine   . Swelling of limb   . UTI (lower urinary tract infection)   . Varicose veins of lower extremities with other complications   . Vitamin D deficiency      Family History  Problem Relation Age of Onset  . Stroke Mother   . Heart disease Mother   . Heart attack Father   . Heart disease Father   . Heart disease Other   . Cancer Sister   . Heart disease Brother      Social History   Socioeconomic History  . Marital status: Widowed    Spouse name: Not on file  . Number of children: Not on file  . Years of education: Not on file  . Highest education level: Not on file  Occupational History  . Occupation: retired  Scientific laboratory technician  . Financial resource strain: Not on file  . Food insecurity:    Worry: Not on file    Inability: Not on file  . Transportation needs:    Medical: Not on file    Non-medical: Not on file  Tobacco Use  . Smoking status: Never Smoker  . Smokeless tobacco: Never Used  Substance and Sexual Activity  . Alcohol use: No  . Drug use: No  . Sexual activity: Not Currently  Lifestyle  . Physical activity:    Days per week: Not on file    Minutes per session: Not on file  . Stress: Not on file  Relationships  . Social connections:    Talks on phone: Not on file    Gets together: Not on file    Attends religious service: Not on file    Active member of club or organization: Not on file    Attends meetings of clubs or organizations: Not on file    Relationship status: Not on file  . Intimate partner violence:    Fear of current or ex partner: Not on file    Emotionally abused: Not on file    Physically abused: Not on file    Forced sexual activity: Not on file  Other Topics Concern  . Not on file  Social History Narrative  . Not on file     Allergies  Allergen Reactions  . Bee Venom Shortness Of Breath and Swelling  . Iodinated Diagnostic Agents Itching  . Penicillins  Swelling  . Tape     Pulls the skin     Outpatient Medications Prior to Visit  Medication Sig Dispense Refill  . allopurinol (ZYLOPRIM) 300 MG tablet Take 1 tablet (300 mg total) by mouth daily as needed (for gout). 30 tablet 1  . atorvastatin (LIPITOR) 40 MG tablet TAKE 1 TABLET BY MOUTH EVERY DAY 30 tablet 1  . benazepril (LOTENSIN) 20 MG tablet Take 1 tablet (20 mg total) by mouth daily. 30 tablet 1  . carvedilol (COREG) 25 MG tablet TAKE 1 TABLET (25 MG TOTAL) BY MOUTH 2 (TWO) TIMES DAILY WITH A MEAL. 60 tablet 1  . Cholecalciferol (VITAMIN D) 2000 UNITS CAPS Take 1 capsule by mouth daily.    . fish oil-omega-3 fatty  acids 1000 MG capsule Take 2 g by mouth 2 (two) times daily.     . FUROSEMIDE PO Take by mouth. Alternate 26m and 438mdaily     . glucose blood (ACCU-CHEK AVIVA PLUS) test strip Check two times daily. Dx code E11.65 100 each 0  . isosorbide mononitrate (IMDUR) 60 MG 24 hr tablet Take 1 tablet (60 mg total) by mouth daily. 30 tablet 1  . metFORMIN (GLUCOPHAGE) 500 MG tablet Take 1 tablet (500 mg total) by mouth 3 (three) times daily with meals. 90 tablet 1  . nystatin (MYCOSTATIN/NYSTOP) powder Apply topically 2 (two) times daily. (Patient taking differently: Apply topically as needed. ) 15 g 0  . potassium chloride SA (K-DUR,KLOR-CON) 20 MEQ tablet Take 1 tablet (20 mEq total) by mouth daily. (Patient taking differently: Take 20 mEq by mouth daily. Pt stated, "I am to take a 1/2 tablet now per my doctor") 90 tablet 2  . triamcinolone cream (KENALOG) 0.1 % Apply 1 application topically as needed.     . warfarin (COUMADIN) 4 MG tablet Take 1 tablet (4 mg total) by mouth one time only at 6 PM. Use as directed. 30 tablet 1   No facility-administered medications prior to visit.         Objective:   Physical Exam Vitals:   12/01/18 1340  BP: 120/78  Pulse: 75  SpO2: 95%  Weight: 193 lb (87.5 kg)  Height: 5' 4" (1.626 m)   Gen: Pleasant, overwt elderly woman, in no  distress,  normal affect  ENT: No lesions,  mouth clear,  oropharynx clear, no postnasal drip  Neck: No JVD, no stridor  Lungs: No use of accessory muscles, no crackles or wheezing on normal respiration, no wheeze on forced expiration  Cardiovascular: irregular, heart sounds normal, no murmur or gallops, some residual edema w compression hose removed.   Musculoskeletal: No deformities, no cyanosis or clubbing  Neuro: alert, awake, non focal  Skin: Warm, no lesions or rash      Assessment & Plan:  Pulmonary hypertension assoc with unclear multi-factorial mechanisms (HCC) Multifactorial pulmonary hypertension.  Principally related to her left-sided heart disease but now notes she has moderate obstructive sleep apnea.  I think this needs to be treated.  She is willing to do so and I will send her back for a CPAP titration study.  She also had some evidence for mild obstructive lung disease, superimposed with restriction.  I think is reasonable to do a trial of a bronchodilator to see if she gets benefit.  Not clear that there is enough obstruction for her to notice an overall breathing improvement.  Respiratory failure with hypoxia (HCC) Oxygenation improved post hospitalization for CHF.  If she is agreeable we can discontinue her supplemental oxygen.   RoBaltazar ApoMD, PhD 12/01/2018, 2:05 PM [facility] [phone] if no answer [phone]

## 2018-12-01 NOTE — Assessment & Plan Note (Signed)
Multifactorial pulmonary hypertension.  Principally related to her left-sided heart disease but now notes she has moderate obstructive sleep apnea.  I think this needs to be treated.  She is willing to do so and I will send her back for a CPAP titration study.  She also had some evidence for mild obstructive lung disease, superimposed with restriction.  I think is reasonable to do a trial of a bronchodilator to see if she gets benefit.  Not clear that there is enough obstruction for her to notice an overall breathing improvement.

## 2018-12-01 NOTE — Patient Instructions (Signed)
We will do a trial of an inhaler called albuterol. You can try using 2 puffs if needed for shortness of breath or chest tightness. You can also try using before exertion to see if this makes your exercise easier.  We will refer you back to the sleep lab to work on starting CPAP treatment.  Follow with Dr Lamonte Sakai in 3 months or sooner if you have any problems.

## 2018-12-01 NOTE — Progress Notes (Signed)
Full PFT performed today.

## 2018-12-04 ENCOUNTER — Ambulatory Visit (INDEPENDENT_AMBULATORY_CARE_PROVIDER_SITE_OTHER): Payer: Medicare Other | Admitting: Pharmacist

## 2018-12-04 DIAGNOSIS — I482 Chronic atrial fibrillation, unspecified: Secondary | ICD-10-CM | POA: Diagnosis not present

## 2018-12-04 DIAGNOSIS — Z7901 Long term (current) use of anticoagulants: Secondary | ICD-10-CM | POA: Diagnosis not present

## 2018-12-04 LAB — POCT INR: INR: 2.4 (ref 2.0–3.0)

## 2018-12-04 NOTE — Patient Instructions (Signed)
Take 52m everyday except 557mon Monday, Wednsday, and Friday. Call [place] [phone] any new medications or upcoming procedures. Recheck INR in 1 week.

## 2018-12-10 DIAGNOSIS — I4821 Permanent atrial fibrillation: Secondary | ICD-10-CM | POA: Diagnosis not present

## 2018-12-10 DIAGNOSIS — Z7901 Long term (current) use of anticoagulants: Secondary | ICD-10-CM | POA: Diagnosis not present

## 2018-12-11 ENCOUNTER — Ambulatory Visit (INDEPENDENT_AMBULATORY_CARE_PROVIDER_SITE_OTHER): Payer: Medicare Other | Admitting: Pharmacist

## 2018-12-11 ENCOUNTER — Other Ambulatory Visit: Payer: Self-pay

## 2018-12-11 DIAGNOSIS — I482 Chronic atrial fibrillation, unspecified: Secondary | ICD-10-CM | POA: Diagnosis not present

## 2018-12-11 DIAGNOSIS — Z7901 Long term (current) use of anticoagulants: Secondary | ICD-10-CM | POA: Diagnosis not present

## 2018-12-11 LAB — POCT INR: INR: 2.2 (ref 2.0–3.0)

## 2018-12-11 NOTE — Patient Instructions (Signed)
Continue taking 86m everyday except 570mon Monday, Wednsday, and Friday. [facility] [phone] any new medications or upcoming procedures. Recheck INR in 1 week.

## 2018-12-17 LAB — PROTIME-INR

## 2018-12-23 ENCOUNTER — Ambulatory Visit: Payer: Medicare Other | Admitting: Cardiovascular Disease

## 2018-12-25 ENCOUNTER — Encounter: Payer: Self-pay | Admitting: Family Medicine

## 2018-12-25 LAB — PROTIME-INR

## 2018-12-29 ENCOUNTER — Other Ambulatory Visit: Payer: Self-pay | Admitting: Family Medicine

## 2019-01-04 ENCOUNTER — Other Ambulatory Visit: Payer: Self-pay | Admitting: Internal Medicine

## 2019-01-06 ENCOUNTER — Telehealth: Payer: Self-pay | Admitting: *Deleted

## 2019-01-06 ENCOUNTER — Telehealth: Payer: Self-pay

## 2019-01-06 DIAGNOSIS — Z7901 Long term (current) use of anticoagulants: Secondary | ICD-10-CM | POA: Diagnosis not present

## 2019-01-06 DIAGNOSIS — I4821 Permanent atrial fibrillation: Secondary | ICD-10-CM | POA: Diagnosis not present

## 2019-01-06 NOTE — Telephone Encounter (Addendum)
Pt left a voicemail and stated she was not going to be able to make it to her appt on tomorrow and to call her. She stated she is too old and doesn't want to come out. Also, she stated I have my machine that I got from my general doctor and that she has been taking her INR and calling it in to the company. Since we have no INR's from her company asked the pt who her PCP was and she stated she could not remember and she don't want her machine taking away. Called out the PCP listed and she wasn't sure if this was the person. Instructed her that we are not trying to get her machine taking away but we are trying to see who has been following her INR and how we can get results to [place].  She said I think my general doctor set this up for me to come to you all. Advised that I was unsure about the details and I needed to know her PCP so we can contact the correct person and see if they have been receiving the INR's and also, to assume care since she is going to continue to see Dr. Fletcher Anon in May.  Pt then stated she wants use to check her and help her with her machine to ensure she is doing this correctly. Advised that the appt for tomorrow was to do that. Pt stated I cannot come and I explained the risks and that I could cancel her appt and I did as she stated she will call back when she is able to come to an appt.   Asked her for the number that she calls her INR into and she stated I really don't want to give you this because I'm afraid they are going to take my machine. Instructed her that we are trying to assist her with this matter and see who has been managing and be able to have the results faxed to [place] and update her current provider if they are agreeable to this plan.  She then gave me [phone], called the number which is [facility]. Advised her that I would update her when I got the information and she stated okay. She stated I tested it today and she said it was 2.7 and last week it was 2.1; she said I don't  know if I'm doing this right or not. Explained to her that we will try to assist her and she started calling out more numbers, she said 2.7 and then it has a percentage with a number, I stated I am not sure but let me call to see what type of machine you have and get any details from the company. She said okay. We ended the call. At 335pm called [facility] and that was the wrong number for the info I needed. They gave me another number which is [phone]; called them and currently still on hold.  At 4:18p, was still on hold until 427pm and then spoke to [place] at [facility] and she stated that Dr. Grier Mitts is the provider listed and the pt has been testing weekly. To have this transferred she states the pt needs to call and update or the current provider. Asked what type of machine the pt has and she stated the pt has a newer machine, which is a Bluetooth CoaguSense and is different from the ones that use a test code strip. She stated they came out and trained the pt and  if she is having problems the pt could call them at [phone]. She stated they don't have extra booklets to send the pt or [place] to assist her.  Will update pt on 01/07/2019.

## 2019-01-06 NOTE — Telephone Encounter (Signed)
lmom for prescreen/drive up

## 2019-01-07 ENCOUNTER — Telehealth: Payer: Self-pay | Admitting: *Deleted

## 2019-01-07 NOTE — Telephone Encounter (Signed)
Please Advise

## 2019-01-07 NOTE — Telephone Encounter (Signed)
Copied from [place] [phone]. Topic: General - Other >> Jan 07, 2019  3:42 PM Antonieta Iba C wrote: Reason for CRM: Candace Hamphill w/ [facility] is calling in because she received a call from pt yesterday requesting that heartcare takes over pt's INR. Pt is a pt of Dr. Rosine Door. [facility] would like to know if it is okay with PCP?  Candace states that she is also with [facility] so if provider want to staff message instead because of hour/availability change due to corona, she is okay with also    CB: [phone]

## 2019-01-07 NOTE — Telephone Encounter (Signed)
Called Dr. Volanda Napoleon office to see if she was managing the pt and confirmed they have been receiving results, however, a message will be sent to Dr. Volanda Napoleon to see if she wants [place] to take over management per pt request or not. Gave the [facility] number to call back and name for staff message if needed. Will await an update on this. Called pt & she was updated. At this time pt will continue with her normal INR checks and send to Dr. Volanda Napoleon until we can change to our practice. Pt states she was 2.7 yesterday and been taking 63m daily and she states she adjusts her meds per her reading. Advised that we would need to regulate her dose and will have to tell her what to do with her dose. She stated that was fine and appreciative of the call.

## 2019-01-07 NOTE — Telephone Encounter (Signed)
[place] for [facility] to take over pt's INR.

## 2019-01-08 ENCOUNTER — Other Ambulatory Visit: Payer: Self-pay | Admitting: Family Medicine

## 2019-01-08 ENCOUNTER — Telehealth: Payer: Self-pay | Admitting: Family Medicine

## 2019-01-08 NOTE — Telephone Encounter (Signed)
Spoke with Candace with [facility] verbalizes understanding that dr Volanda Napoleon approves for Dr Fletcher Anon to take over pts INR, Candace states that our office needs to notify [facility] that Dr Fletcher Anon is now taking over pt INR so they can start sending pt faxes to [facility]. Spoke with Sharyn Lull at [facility] in regards to changing pt INR care from dr Volanda Napoleon to dr Fletcher Anon.

## 2019-01-08 NOTE — Telephone Encounter (Signed)
Received a call from [place] with Dr. Volanda Napoleon office and she stated that the Dr. Volanda Napoleon is fine with [place] taking over management for the pt's INR. They will place a note in the pt's chart regarding this. Advised that they need to call [facility] to update them and let them know we will take over management. She stated they will do this and I gave her the [facility] number.

## 2019-01-08 NOTE — Telephone Encounter (Signed)
Pt called and requested a refill on lasix she said on the voicemail that this was given to her by a ER doctor.

## 2019-01-12 ENCOUNTER — Other Ambulatory Visit: Payer: Self-pay

## 2019-01-12 MED ORDER — FUROSEMIDE 20 MG PO TABS: 20.0000 mg | ORAL_TABLET | Freq: Every day | ORAL | 1 refills | Status: DC

## 2019-01-12 NOTE — Telephone Encounter (Signed)
R x sent to pharmacy

## 2019-01-22 NOTE — Telephone Encounter (Signed)
[facility] form has been faxed with Dr Fletcher Anon as provider.

## 2019-01-27 LAB — POCT INR: INR: 2 (ref 2.0–3.0)

## 2019-01-30 ENCOUNTER — Ambulatory Visit (INDEPENDENT_AMBULATORY_CARE_PROVIDER_SITE_OTHER): Payer: Medicare Other | Admitting: Cardiovascular Disease

## 2019-01-30 ENCOUNTER — Other Ambulatory Visit: Payer: Self-pay | Admitting: Family Medicine

## 2019-01-30 DIAGNOSIS — Z7901 Long term (current) use of anticoagulants: Secondary | ICD-10-CM

## 2019-01-30 DIAGNOSIS — I482 Chronic atrial fibrillation, unspecified: Secondary | ICD-10-CM | POA: Diagnosis not present

## 2019-02-03 ENCOUNTER — Ambulatory Visit (INDEPENDENT_AMBULATORY_CARE_PROVIDER_SITE_OTHER): Payer: Medicare Other | Admitting: Pharmacist Clinician (PhC)/ Clinical Pharmacy Specialist

## 2019-02-03 ENCOUNTER — Telehealth: Payer: Self-pay | Admitting: Family Medicine

## 2019-02-03 DIAGNOSIS — I4821 Permanent atrial fibrillation: Secondary | ICD-10-CM | POA: Diagnosis not present

## 2019-02-03 DIAGNOSIS — Z7901 Long term (current) use of anticoagulants: Secondary | ICD-10-CM | POA: Diagnosis not present

## 2019-02-03 DIAGNOSIS — I482 Chronic atrial fibrillation, unspecified: Secondary | ICD-10-CM

## 2019-02-03 LAB — POCT INR: INR: 2.1 (ref 2.0–3.0)

## 2019-02-03 NOTE — Telephone Encounter (Signed)
Pt sent in a msg want to have some labs done can I have some orders put in so that she can be scheduled?  Pt state that she don't think that she needs to have an appointment just really don't want to come in the office and do not have a device (computer or laptop) to be able to do a virtual appointment.

## 2019-02-05 NOTE — Telephone Encounter (Signed)
Pt states that she has A1C , Pottasium and CBC done every 3 months and she wants to know if she can have this done now. Pt advised if she needs a virtual visit for any issue pt states no, pt is aware if labs are not necessary to have right now they can wait until things are back to normal. Please advise

## 2019-02-05 NOTE — Telephone Encounter (Signed)
Pt does not need hgb A1C and CBC q 3 months.  Last hgb A1C was well controlled under 7%.  Pt should be taking Kdur 20 mEq daily as she has a h/o hypokalemia.  It is reasonable to recheck her potassium as it was 3.4 when last checked.

## 2019-02-05 NOTE — Telephone Encounter (Signed)
Spoke wit Rebekah Stewart verbalized understanding of Dr Volanda Napoleon instructions. Rebekah Stewart aware to continue taking her potassium.

## 2019-02-10 ENCOUNTER — Ambulatory Visit (INDEPENDENT_AMBULATORY_CARE_PROVIDER_SITE_OTHER): Payer: Medicare Other | Admitting: Pharmacist

## 2019-02-10 ENCOUNTER — Ambulatory Visit: Payer: Medicare Other | Admitting: Cardiovascular Disease

## 2019-02-10 DIAGNOSIS — Z7901 Long term (current) use of anticoagulants: Secondary | ICD-10-CM | POA: Diagnosis not present

## 2019-02-10 DIAGNOSIS — I482 Chronic atrial fibrillation, unspecified: Secondary | ICD-10-CM

## 2019-02-10 LAB — POCT INR: INR: 2 (ref 2.0–3.0)

## 2019-02-17 LAB — POCT INR: INR: 1.9 — AB (ref 2.0–3.0)

## 2019-02-18 ENCOUNTER — Ambulatory Visit (INDEPENDENT_AMBULATORY_CARE_PROVIDER_SITE_OTHER): Payer: Medicare Other

## 2019-02-18 DIAGNOSIS — Z7901 Long term (current) use of anticoagulants: Secondary | ICD-10-CM

## 2019-02-18 DIAGNOSIS — I482 Chronic atrial fibrillation, unspecified: Secondary | ICD-10-CM

## 2019-02-18 NOTE — Patient Instructions (Signed)
Description   Called spoke with pt, advised to take 7.64m today, then resume same dosage 536meveryday. Call us[place]at [phone] any new medications or upcoming procedures. Recheck INR in 1 week per patient request.

## 2019-02-24 LAB — POCT INR: INR: 1.9 — AB (ref 2.0–3.0)

## 2019-02-26 ENCOUNTER — Ambulatory Visit (INDEPENDENT_AMBULATORY_CARE_PROVIDER_SITE_OTHER): Payer: Medicare Other | Admitting: Pharmacist

## 2019-02-26 DIAGNOSIS — M79605 Pain in left leg: Secondary | ICD-10-CM

## 2019-02-26 DIAGNOSIS — M79604 Pain in right leg: Secondary | ICD-10-CM

## 2019-02-26 DIAGNOSIS — Z7901 Long term (current) use of anticoagulants: Secondary | ICD-10-CM

## 2019-02-26 DIAGNOSIS — I482 Chronic atrial fibrillation, unspecified: Secondary | ICD-10-CM | POA: Diagnosis not present

## 2019-02-26 DIAGNOSIS — I5032 Chronic diastolic (congestive) heart failure: Secondary | ICD-10-CM

## 2019-02-26 DIAGNOSIS — I5033 Acute on chronic diastolic (congestive) heart failure: Secondary | ICD-10-CM

## 2019-02-27 ENCOUNTER — Other Ambulatory Visit: Payer: Self-pay | Admitting: Family Medicine

## 2019-03-04 ENCOUNTER — Telehealth: Payer: Self-pay | Admitting: Cardiovascular Disease

## 2019-03-04 DIAGNOSIS — I5033 Acute on chronic diastolic (congestive) heart failure: Secondary | ICD-10-CM

## 2019-03-04 NOTE — Telephone Encounter (Signed)
Spoke with pt, she has not had any lab work since discharge from the hospital in November. She would like to have her lab work checked. Order placed and mailed to the patient.

## 2019-03-04 NOTE — Telephone Encounter (Signed)
° ° °  Patient requesting orders for labs to check kidney function, and potassium

## 2019-03-05 ENCOUNTER — Ambulatory Visit (INDEPENDENT_AMBULATORY_CARE_PROVIDER_SITE_OTHER): Payer: Medicare Other | Admitting: Pharmacist

## 2019-03-05 DIAGNOSIS — Z7901 Long term (current) use of anticoagulants: Secondary | ICD-10-CM

## 2019-03-05 DIAGNOSIS — I482 Chronic atrial fibrillation, unspecified: Secondary | ICD-10-CM | POA: Diagnosis not present

## 2019-03-05 DIAGNOSIS — I4821 Permanent atrial fibrillation: Secondary | ICD-10-CM | POA: Diagnosis not present

## 2019-03-05 LAB — POCT INR: INR: 2.3 (ref 2.0–3.0)

## 2019-03-10 ENCOUNTER — Telehealth: Payer: Self-pay | Admitting: Cardiovascular Disease

## 2019-03-10 DIAGNOSIS — E785 Hyperlipidemia, unspecified: Secondary | ICD-10-CM

## 2019-03-10 DIAGNOSIS — I2510 Atherosclerotic heart disease of native coronary artery without angina pectoris: Secondary | ICD-10-CM

## 2019-03-10 DIAGNOSIS — I10 Essential (primary) hypertension: Secondary | ICD-10-CM

## 2019-03-10 DIAGNOSIS — Z79899 Other long term (current) drug therapy: Secondary | ICD-10-CM

## 2019-03-10 DIAGNOSIS — I5032 Chronic diastolic (congestive) heart failure: Secondary | ICD-10-CM

## 2019-03-10 LAB — POCT INR: INR: 2.4 (ref 2.0–3.0)

## 2019-03-10 NOTE — Telephone Encounter (Signed)
New Message     Pt says she has lab requests for a Basic Metabolic Panel but she is wanting to get the Comprehensive Metabolic panel     Please call back

## 2019-03-10 NOTE — Telephone Encounter (Signed)
Spoke with patient who had a BMET ordered but since she is planning to have labs done, she wants to see if she can instead have CMET & BNP - wants a thorough check of labs. Will route to MD to OK these labs and see if he thinks she needs any additional labs

## 2019-03-10 NOTE — Telephone Encounter (Signed)
Call returned to the patient. Her voicemail was full. Orders for a CMET, BNP and a fasting Lipid have been placed.

## 2019-03-11 ENCOUNTER — Ambulatory Visit (INDEPENDENT_AMBULATORY_CARE_PROVIDER_SITE_OTHER): Payer: Medicare Other | Admitting: Pharmacist Clinician (PhC)/ Clinical Pharmacy Specialist

## 2019-03-11 DIAGNOSIS — I482 Chronic atrial fibrillation, unspecified: Secondary | ICD-10-CM

## 2019-03-11 DIAGNOSIS — Z7901 Long term (current) use of anticoagulants: Secondary | ICD-10-CM | POA: Diagnosis not present

## 2019-03-16 ENCOUNTER — Other Ambulatory Visit: Payer: Self-pay | Admitting: *Deleted

## 2019-03-16 DIAGNOSIS — Z79899 Other long term (current) drug therapy: Secondary | ICD-10-CM

## 2019-03-16 DIAGNOSIS — I5032 Chronic diastolic (congestive) heart failure: Secondary | ICD-10-CM

## 2019-03-16 DIAGNOSIS — I2510 Atherosclerotic heart disease of native coronary artery without angina pectoris: Secondary | ICD-10-CM

## 2019-03-16 DIAGNOSIS — I10 Essential (primary) hypertension: Secondary | ICD-10-CM

## 2019-03-16 DIAGNOSIS — E785 Hyperlipidemia, unspecified: Secondary | ICD-10-CM

## 2019-03-16 NOTE — Telephone Encounter (Signed)
Patient has been made aware that the lab orders have been placed.

## 2019-03-18 ENCOUNTER — Ambulatory Visit (INDEPENDENT_AMBULATORY_CARE_PROVIDER_SITE_OTHER): Payer: Medicare Other | Admitting: Pharmacist

## 2019-03-18 DIAGNOSIS — Z7901 Long term (current) use of anticoagulants: Secondary | ICD-10-CM | POA: Diagnosis not present

## 2019-03-18 DIAGNOSIS — I482 Chronic atrial fibrillation, unspecified: Secondary | ICD-10-CM | POA: Diagnosis not present

## 2019-03-18 LAB — POCT INR: INR: 2.2 (ref 2.0–3.0)

## 2019-03-19 DIAGNOSIS — L820 Inflamed seborrheic keratosis: Secondary | ICD-10-CM | POA: Diagnosis not present

## 2019-03-19 DIAGNOSIS — L821 Other seborrheic keratosis: Secondary | ICD-10-CM | POA: Diagnosis not present

## 2019-03-19 DIAGNOSIS — D485 Neoplasm of uncertain behavior of skin: Secondary | ICD-10-CM | POA: Diagnosis not present

## 2019-03-20 ENCOUNTER — Other Ambulatory Visit: Payer: Self-pay

## 2019-03-20 ENCOUNTER — Telehealth: Payer: Self-pay | Admitting: Family Medicine

## 2019-03-20 MED ORDER — ONETOUCH DELICA LANCETS 30G MISC: 1.0000 | Freq: Two times a day (BID) | 0 refills | Status: DC

## 2019-03-20 NOTE — Telephone Encounter (Signed)
Pt called stating she only has 1 lancet left. Pt testing 1-2 times per day. She uses the [facility]. Please send RX to pharmacy.   [id number] [place], [place] - [address] [phone](Phone) [phone](Fax)

## 2019-03-22 ENCOUNTER — Other Ambulatory Visit: Payer: Self-pay | Admitting: Family Medicine

## 2019-03-23 NOTE — Telephone Encounter (Signed)
Pharmacy calling because the [place] 10 code was left off the Rx sent to them.  OneTouch Delica Lancets 50N MISC  They need NEW Rx with IC 10 code, frequency Sent back to  [id number] [place], [place] - [address] [phone](Phone) [phone](Fax)   Pt now out, needs asap

## 2019-03-24 ENCOUNTER — Other Ambulatory Visit: Payer: Self-pay

## 2019-03-24 LAB — POCT INR: INR: 2.1 (ref 2.0–3.0)

## 2019-03-24 MED ORDER — ONETOUCH DELICA LANCETS 30G MISC: 1.0000 | Freq: Two times a day (BID) | 0 refills | Status: DC

## 2019-03-24 NOTE — Telephone Encounter (Signed)
Rx was resent

## 2019-03-25 ENCOUNTER — Ambulatory Visit (INDEPENDENT_AMBULATORY_CARE_PROVIDER_SITE_OTHER): Payer: Medicare Other | Admitting: Internal Medicine

## 2019-03-25 DIAGNOSIS — I482 Chronic atrial fibrillation, unspecified: Secondary | ICD-10-CM | POA: Diagnosis not present

## 2019-03-25 DIAGNOSIS — Z7901 Long term (current) use of anticoagulants: Secondary | ICD-10-CM

## 2019-03-26 ENCOUNTER — Telehealth: Payer: Self-pay | Admitting: *Deleted

## 2019-03-26 ENCOUNTER — Other Ambulatory Visit: Payer: Self-pay | Admitting: Family Medicine

## 2019-03-26 NOTE — Telephone Encounter (Signed)
    COVID-19 Pre-Screening Questions:  . In the past 7 to 10 days have you had a cough,  shortness of breath, headache, congestion, fever (100 or greater) body aches, chills, sore throat, or sudden loss of taste or sense of smell? . Have you been around anyone with known Covid 19. . Have you been around anyone who is awaiting Covid 19 test results in the past 7 to 10 days? . Have you been around anyone who has been exposed to Covid 19, or has mentioned symptoms of Covid 19 within the past 7 to 10 days?  If you have any concerns/questions about symptoms patients report during screening (either on the phone or at threshold). Contact the provider seeing the patient or DOD for further guidance.  If neither are available contact a member of the leadership team.           Contacted patient via telephone call, answered NO to all [place] 19 questions has a mask. KB

## 2019-03-27 ENCOUNTER — Other Ambulatory Visit: Payer: Medicare Other | Admitting: *Deleted

## 2019-03-27 ENCOUNTER — Other Ambulatory Visit: Payer: Self-pay

## 2019-03-27 DIAGNOSIS — I10 Essential (primary) hypertension: Secondary | ICD-10-CM | POA: Diagnosis not present

## 2019-03-27 DIAGNOSIS — I2510 Atherosclerotic heart disease of native coronary artery without angina pectoris: Secondary | ICD-10-CM | POA: Diagnosis not present

## 2019-03-27 DIAGNOSIS — Z79899 Other long term (current) drug therapy: Secondary | ICD-10-CM | POA: Diagnosis not present

## 2019-03-27 DIAGNOSIS — I5032 Chronic diastolic (congestive) heart failure: Secondary | ICD-10-CM | POA: Diagnosis not present

## 2019-03-27 DIAGNOSIS — E785 Hyperlipidemia, unspecified: Secondary | ICD-10-CM | POA: Diagnosis not present

## 2019-03-27 LAB — COMPREHENSIVE METABOLIC PANEL
ALT: 17 IU/L (ref 0–32)
AST: 19 IU/L (ref 0–40)
Albumin/Globulin Ratio: 1.9 (ref 1.2–2.2)
Albumin: 4.2 g/dL (ref 3.6–4.6)
Alkaline Phosphatase: 75 IU/L (ref 39–117)
BUN/Creatinine Ratio: 24 (ref 12–28)
BUN: 23 mg/dL (ref 8–27)
Bilirubin Total: 0.9 mg/dL (ref 0.0–1.2)
CO2: 26 mmol/L (ref 20–29)
Calcium: 9.4 mg/dL (ref 8.7–10.3)
Chloride: 101 mmol/L (ref 96–106)
Creatinine, Ser: 0.95 mg/dL (ref 0.57–1.00)
GFR calc Af Amer: 65 mL/min/{1.73_m2} (ref >59)
GFR calc non Af Amer: 56 mL/min/{1.73_m2} — ABNORMAL LOW (ref >59)
Globulin, Total: 2.2 g/dL (ref 1.5–4.5)
Glucose: 119 mg/dL — ABNORMAL HIGH (ref 65–99)
Potassium: 4.2 mmol/L (ref 3.5–5.2)
Sodium: 145 mmol/L — ABNORMAL HIGH (ref 134–144)
Total Protein: 6.4 g/dL (ref 6.0–8.5)

## 2019-03-27 LAB — LIPID PANEL
Chol/HDL Ratio: 2.1 ratio (ref 0.0–4.4)
Cholesterol, Total: 129 mg/dL (ref 100–199)
HDL: 62 mg/dL (ref >39)
LDL Calculated: 51 mg/dL (ref 0–99)
Triglycerides: 79 mg/dL (ref 0–149)
VLDL Cholesterol Cal: 16 mg/dL (ref 5–40)

## 2019-03-28 LAB — BRAIN NATRIURETIC PEPTIDE: BNP: 268.8 pg/mL — ABNORMAL HIGH (ref 0.0–100.0)

## 2019-03-30 ENCOUNTER — Telehealth: Payer: Self-pay | Admitting: Cardiovascular Disease

## 2019-03-30 NOTE — Telephone Encounter (Signed)
Discussed with pt appt scheduled

## 2019-03-30 NOTE — Telephone Encounter (Signed)
I called pt, no answer and was unable to leave a voicemail.

## 2019-03-30 NOTE — Telephone Encounter (Signed)
New message:    Patient calling stating she would like to se the doctor tomorrow. She has been having some coughing spells. Patient would like a appt soon.

## 2019-03-31 ENCOUNTER — Encounter: Payer: Self-pay | Admitting: Cardiovascular Disease

## 2019-03-31 ENCOUNTER — Ambulatory Visit (INDEPENDENT_AMBULATORY_CARE_PROVIDER_SITE_OTHER): Payer: Medicare Other | Admitting: Cardiovascular Disease

## 2019-03-31 ENCOUNTER — Other Ambulatory Visit: Payer: Self-pay

## 2019-03-31 VITALS — BP 146/94 | HR 77 | Ht 64.0 in | Wt 191.0 lb

## 2019-03-31 DIAGNOSIS — I482 Chronic atrial fibrillation, unspecified: Secondary | ICD-10-CM

## 2019-03-31 DIAGNOSIS — I5033 Acute on chronic diastolic (congestive) heart failure: Secondary | ICD-10-CM

## 2019-03-31 DIAGNOSIS — I4821 Permanent atrial fibrillation: Secondary | ICD-10-CM | POA: Diagnosis not present

## 2019-03-31 DIAGNOSIS — I10 Essential (primary) hypertension: Secondary | ICD-10-CM | POA: Diagnosis not present

## 2019-03-31 DIAGNOSIS — I2510 Atherosclerotic heart disease of native coronary artery without angina pectoris: Secondary | ICD-10-CM | POA: Diagnosis not present

## 2019-03-31 DIAGNOSIS — Z7901 Long term (current) use of anticoagulants: Secondary | ICD-10-CM | POA: Diagnosis not present

## 2019-03-31 LAB — POCT INR: INR: 2.1 (ref 2.0–3.0)

## 2019-03-31 MED ORDER — SPIRONOLACTONE 25 MG PO TABS: 25.0000 mg | ORAL_TABLET | Freq: Every day | ORAL | 1 refills | Status: DC

## 2019-03-31 NOTE — Patient Instructions (Signed)
Medication Instructions:  STOP Potassium   START Spironolactone 25 mg daily.  If you need a refill on your cardiac medications before your next appointment, please call your pharmacy.   Lab work: Your physician recommends that you return for lab work today: BMET  If you have labs (blood work) drawn today and your tests are completely normal, you will receive your results only by: Marland Kitchen MyChart Message (if you have MyChart) OR . A paper copy in the mail If you have any lab test that is abnormal or we need to change your treatment, we will call you to review the results.  Follow-Up: At [facility], you and your health needs are our priority.  As part of our continuing mission to provide you with exceptional heart care, we have created designated Provider Care Teams.  These Care Teams include your primary Cardiologist (physician) and Advanced Practice Providers (APPs -  Physician Assistants and Nurse Practitioners) who all work together to provide you with the care you need, when you need it. . Please keep your scheduled appointment with Dr. Fletcher Anon for an IN OFFICE visit on Tuesday, 05/19/19 at 11:40 AM.  Any Other Special Instructions Will Be Listed Below (If Applicable). None

## 2019-03-31 NOTE — Telephone Encounter (Signed)
Patient is returning your call.

## 2019-03-31 NOTE — Progress Notes (Signed)
Cardiology Office Note   Date:  03/31/2019   ID:  Rebekah Stewart, [place] 27-Dec-1934, MRN [id number]  PCP:  Billie Ruddy, MD  Cardiologist:   Kathlyn Sacramento, MD   No chief complaint on file.     History of Present Illness: Rebekah Stewart is a 84 y.o. female who presents for  a followup visit regarding chronic atrial fibrillation, refractory hypertension and diastolic heart failure. She had a cardiac catheterization done in 2010 which showed mild to moderate three-vessel nonobstructive coronary artery disease. Worst stenosis was in the mid LAD which was estimated to be 50%. She has multiple chronic medical conditions including Diabetes and hyperlipidemia. She is known to have chronic bilateral leg edema  with stasis dermatitis.  She has secondary lymphedema due to chronic venous insufficiency. Lower extremity arterial Doppler in July showed normal ABI bilaterally. The patient could not afford Eliquis due to cost and she is back on warfarin.    She had previous venous ulceration of the lower extremities.  I prescribed lymphedema pump.    She used to be on spironolactone which was discontinued due to hypotension.  He had atypical chest pain last year and had a [place] which was normal.  She was hospitalized in November with mild heart failure that improved with diuresis.  She has been doing reasonably well and her weight has been relatively stable.  However, she reports episodes of shortness of breath and wheezing.  She also has been coughing more than before but it is mostly a dry nonproductive cough.  Her blood pressure has been running high.   Past Medical History:  Diagnosis Date  . Arthritis of back    lumbar  . Atrial fibrillation ([place]) 2010  . Atrial fibrillation ([place])   . Atrial fibrillation ([place])   . Chronic diastolic heart failure ([place])   . Colonic polyp   . Compression fracture of thoracic vertebra (H[facility])   . Congestive heart failure, unspecified   .  Coronary artery disease    mild non obstructive disease per cardiac cath in 2010  . Degeneration of lumbar or lumbosacral intervertebral disc   . Diverticulosis   . DM2 (diabetes mellitus, type 2) ([place])   . Hemorrhoids   . HLD (hyperlipidemia)   . HTN (hypertension)    diagnosed when she was in her 25s. Refractory. No RAS by [place] in 2010  . Long-term (current) use of anticoagulants   . Lumbosacral spondylosis without myelopathy   . Osteoarthrosis, unspecified whether generalized or localized, unspecified site   . Pain in thoracic spine   . Swelling of limb   . UTI (lower urinary tract infection)   . Varicose veins of lower extremities with other complications   . Vitamin D deficiency     Past Surgical History:  Procedure Laterality Date  . ADENOIDECTOMY    . CARDIAC CATHETERIZATION  2010   At [facility]. Mild nonobstructive CAD  . CORONARY ANGIOPLASTY    . EYE SURGERY    . TONSILLECTOMY       Current Outpatient Medications  Medication Sig Dispense Refill  . allopurinol (ZYLOPRIM) 300 MG tablet Take 1 tablet (300 mg total) by mouth daily as needed (for gout). 30 tablet 1  . atorvastatin (LIPITOR) 40 MG tablet TAKE 1 TABLET BY MOUTH EVERY DAY 30 tablet 1  . benazepril (LOTENSIN) 20 MG tablet Take 1 tablet (20 mg total) by mouth daily. 30 tablet 1  . carvedilol (COREG) 25 MG tablet  TAKE 1 TABLET (25 MG TOTAL) BY MOUTH 2 (TWO) TIMES DAILY WITH A MEAL. 180 tablet 0  . Cholecalciferol (VITAMIN D) 2000 UNITS CAPS Take 1 capsule by mouth daily.    . fish oil-omega-3 fatty acids 1000 MG capsule Take 2 g by mouth 2 (two) times daily.     . furosemide (LASIX) 20 MG tablet TAKE 1 TABLET (20 MG TOTAL) BY MOUTH DAILY. ALTERNATE 20MG AND 40MG DAILY 30 tablet 1  . glucose blood (ACCU-CHEK AVIVA PLUS) test strip Check two times daily. Dx code E11.65 100 each 0  . isosorbide mononitrate (IMDUR) 60 MG 24 hr tablet Take 1 tablet (60 mg total) by mouth daily. 30 tablet 1  . metFORMIN  (GLUCOPHAGE) 500 MG tablet Take 1 tablet (500 mg total) by mouth 3 (three) times daily with meals. 90 tablet 1  . OneTouch Delica Lancets 84Z MISC 1 each by Does not apply route 2 (two) times daily before a meal. 100 each 0  . warfarin (COUMADIN) 4 MG tablet Take 1 tablet (4 mg total) by mouth one time only at 6 PM. Use as directed. 30 tablet 1  . spironolactone (ALDACTONE) 25 MG tablet Take 1 tablet (25 mg total) by mouth daily. 90 tablet 1   No current facility-administered medications for this visit.     Allergies:   Bee venom, Iodinated diagnostic agents, Penicillins, and Tape    Social History:  The patient  reports that she has never smoked. She has never used smokeless tobacco. She reports that she does not drink alcohol or use drugs.   Family History:  The patient's family history includes Cancer in her sister; Heart attack in her father; Heart disease in her brother, father, mother, and another family member; Stroke in her mother.    ROS:  Please see the history of present illness.   Otherwise, review of systems are positive for none.   All other systems are reviewed and negative.    PHYSICAL EXAM: VS:  BP (!) 146/94   Pulse 77   Ht 5' 4" (1.626 m)   Wt 191 lb (86.6 kg)   BMI 32.79 kg/m  , BMI Body mass index is 32.79 kg/m. GEN: Well nourished, well developed, in no acute distress  HEENT: normal  Neck: no JVD, carotid bruits, or masses Cardiac: Irregularly irregular ; no murmurs, rubs, or gallops,  Respiratory:  clear to auscultation bilaterally, normal work of breathing GI: soft, nontender, nondistended, + BS MS: no deformity or atrophy  Skin: warm and dry, no rash Neuro:  Strength and sensation are intact Psych: euthymic mood, full affect Dorsalis pedis is palpable bilaterally.  Mild bilateral leg edema which has improved from before  EKG:  EKG is ordered today. EKG showed atrial fibrillation with ventricular rate of 77 bpm.  Low voltage with nonspecific T wave  changes.   Recent Labs: 08/09/2018: Hemoglobin 14.2; Platelets 332; TSH 2.312 08/12/2018: Magnesium 1.7 11/05/2018: Pro B Natriuretic peptide (BNP) 157.0 03/27/2019: ALT 17; BNP 268.8; BUN 23; Creatinine, Ser 0.95; Potassium 4.2; Sodium 145    Lipid Panel    Component Value Date/Time   CHOL 129 03/27/2019 0000   TRIG 79 03/27/2019 0000   HDL 62 03/27/2019 0000   CHOLHDL 2.1 03/27/2019 0000   LDLCALC 51 03/27/2019 0000      Wt Readings from Last 3 Encounters:  03/31/19 191 lb (86.6 kg)  12/01/18 193 lb (87.5 kg)  11/20/18 191 lb 3.2 oz (86.7 kg)  ASSESSMENT AND PLAN:  1.  Chronic atrial fibrillation:  Ventricular rate is reasonably controlled on carvedilol.  She is on long-term anticoagulation with warfarin.   2. Essential hypertension:  Blood pressure has been elevated and this has been the case and spironolactone was discontinued.  I elected to resume spironolactone at 25 mg once daily and discontinue potassium chloride.  Recheck basic metabolic profile in 1 week  3. Coronary artery disease involving native coronary arteries with atypical chest pain: Stress test last year was normal  4. Hyperlipidemia: Continue treatment with atorvastatin.  Most recent LDL was 58.  5. Chronic diastolic heart failure: She complains of intermittent episodes of shortness of breath and dry cough.  Her weight has been stable and I do not see evidence of significant volume overload.  It is possible that uncontrolled hypertension is contributing and thus I elected to resume spironolactone as outlined above.  I advised her to follow low-sodium diet.  6.  Chronic venous insufficiency with secondary lymphedema currently with history venous ulceration.  This has improved significantly with lymphedema pump.  Currently she is not using it given that she is staying with her niece here in [place] and the machine is in her house in [place].   Disposition:   FU with me in 2 months  Signed,   Kathlyn Sacramento, MD  03/31/2019 8:58 AM    [place]

## 2019-04-01 ENCOUNTER — Ambulatory Visit (INDEPENDENT_AMBULATORY_CARE_PROVIDER_SITE_OTHER): Payer: Medicare Other | Admitting: Pharmacist

## 2019-04-01 DIAGNOSIS — Z7901 Long term (current) use of anticoagulants: Secondary | ICD-10-CM | POA: Diagnosis not present

## 2019-04-01 DIAGNOSIS — I482 Chronic atrial fibrillation, unspecified: Secondary | ICD-10-CM

## 2019-04-07 ENCOUNTER — Ambulatory Visit: Payer: Medicare Other | Admitting: Sports Medicine

## 2019-04-08 LAB — POCT INR: INR: 2.9 (ref 2.0–3.0)

## 2019-04-09 ENCOUNTER — Ambulatory Visit (INDEPENDENT_AMBULATORY_CARE_PROVIDER_SITE_OTHER): Payer: Medicare Other | Admitting: Cardiology

## 2019-04-09 DIAGNOSIS — Z7901 Long term (current) use of anticoagulants: Secondary | ICD-10-CM

## 2019-04-09 DIAGNOSIS — I482 Chronic atrial fibrillation, unspecified: Secondary | ICD-10-CM

## 2019-04-10 DIAGNOSIS — I5033 Acute on chronic diastolic (congestive) heart failure: Secondary | ICD-10-CM | POA: Diagnosis not present

## 2019-04-10 DIAGNOSIS — I10 Essential (primary) hypertension: Secondary | ICD-10-CM | POA: Diagnosis not present

## 2019-04-10 DIAGNOSIS — I482 Chronic atrial fibrillation, unspecified: Secondary | ICD-10-CM | POA: Diagnosis not present

## 2019-04-10 LAB — BASIC METABOLIC PANEL
BUN/Creatinine Ratio: 18 (ref 12–28)
BUN: 16 mg/dL (ref 8–27)
CO2: 27 mmol/L (ref 20–29)
Calcium: 9.2 mg/dL (ref 8.7–10.3)
Chloride: 103 mmol/L (ref 96–106)
Creatinine, Ser: 0.87 mg/dL (ref 0.57–1.00)
GFR calc Af Amer: 72 mL/min/{1.73_m2} (ref >59)
GFR calc non Af Amer: 63 mL/min/{1.73_m2} (ref >59)
Glucose: 128 mg/dL — ABNORMAL HIGH (ref 65–99)
Potassium: 4 mmol/L (ref 3.5–5.2)
Sodium: 140 mmol/L (ref 134–144)

## 2019-04-14 ENCOUNTER — Encounter: Payer: Self-pay | Admitting: *Deleted

## 2019-04-14 LAB — POCT INR: INR: 3 (ref 2.0–3.0)

## 2019-04-15 ENCOUNTER — Ambulatory Visit (INDEPENDENT_AMBULATORY_CARE_PROVIDER_SITE_OTHER): Payer: Medicare Other | Admitting: Pharmacist Clinician (PhC)/ Clinical Pharmacy Specialist

## 2019-04-15 DIAGNOSIS — I482 Chronic atrial fibrillation, unspecified: Secondary | ICD-10-CM | POA: Diagnosis not present

## 2019-04-15 DIAGNOSIS — Z7901 Long term (current) use of anticoagulants: Secondary | ICD-10-CM

## 2019-04-16 ENCOUNTER — Other Ambulatory Visit: Payer: Self-pay | Admitting: Family Medicine

## 2019-04-17 ENCOUNTER — Ambulatory Visit (INDEPENDENT_AMBULATORY_CARE_PROVIDER_SITE_OTHER): Payer: Medicare Other | Admitting: Sports Medicine

## 2019-04-17 ENCOUNTER — Other Ambulatory Visit: Payer: Self-pay

## 2019-04-17 ENCOUNTER — Encounter: Payer: Self-pay | Admitting: Sports Medicine

## 2019-04-17 VITALS — Temp 96.3°F | Resp 16

## 2019-04-17 DIAGNOSIS — E1140 Type 2 diabetes mellitus with diabetic neuropathy, unspecified: Secondary | ICD-10-CM | POA: Diagnosis not present

## 2019-04-17 DIAGNOSIS — M79675 Pain in left toe(s): Secondary | ICD-10-CM

## 2019-04-17 DIAGNOSIS — B351 Tinea unguium: Secondary | ICD-10-CM | POA: Diagnosis not present

## 2019-04-17 DIAGNOSIS — I739 Peripheral vascular disease, unspecified: Secondary | ICD-10-CM

## 2019-04-17 DIAGNOSIS — M79674 Pain in right toe(s): Secondary | ICD-10-CM

## 2019-04-17 NOTE — Progress Notes (Signed)
Subjective: Rebekah Stewart is a 84 y.o. female patient with history of diabetes who returns to office today complaining of long, painful nails while ambulating in shoes; unable to trim. Patient states that the glucose reading this morning was 133 last A1c recorded at 7and she saw her primary care Dr. [name] a couple of months ago.  Was in [facility] in Nov for CHF. Patient denies any other pedal complaints.   Patient Active Problem List   Diagnosis Date Noted  . Respiratory failure with hypoxia ([place]) 10/10/2018  . Chronic anticoagulation 08/19/2018  . Non-insulin dependent type 2 diabetes mellitus ([place]) 08/13/2018  . Intertrigo 08/10/2018  . Pulmonary hypertension assoc with unclear multi-factorial mechanisms ([place])   . Acute on chronic diastolic heart failure ([place])   . Flash pulmonary edema ([place]) 08/09/2018  . Varicose veins of lower extremities with complications, bilateral 10/30/2016  . Dyslipidemia 05/21/2014  . Bilateral leg pain 11/26/2012  . Chronic diastolic heart failure ([place])   . Chronic atrial fibrillation   . Essential hypertension   . Coronary artery disease    Current Outpatient Medications on File Prior to Visit  Medication Sig Dispense Refill  . allopurinol (ZYLOPRIM) 300 MG tablet Take 1 tablet (300 mg total) by mouth daily as needed (for gout). 30 tablet 1  . atorvastatin (LIPITOR) 40 MG tablet TAKE 1 TABLET BY MOUTH EVERY DAY 30 tablet 1  . benazepril (LOTENSIN) 20 MG tablet Take 1 tablet (20 mg total) by mouth daily. 30 tablet 1  . carvedilol (COREG) 25 MG tablet TAKE 1 TABLET (25 MG TOTAL) BY MOUTH 2 (TWO) TIMES DAILY WITH A MEAL. 180 tablet 0  . Cholecalciferol (VITAMIN D) 2000 UNITS CAPS Take 1 capsule by mouth daily.    . fish oil-omega-3 fatty acids 1000 MG capsule Take 2 g by mouth 2 (two) times daily.     . furosemide (LASIX) 20 MG tablet TAKE 1 TABLET BY MOUTH EVERY DAY ALTERNATING 20MG & 40MG 30 tablet 1  . glucose blood (ACCU-CHEK AVIVA PLUS) test strip  Check two times daily. Dx code E11.65 100 each 0  . isosorbide mononitrate (IMDUR) 60 MG 24 hr tablet Take 1 tablet (60 mg total) by mouth daily. 30 tablet 1  . metFORMIN (GLUCOPHAGE) 500 MG tablet Take 1 tablet (500 mg total) by mouth 3 (three) times daily with meals. 90 tablet 1  . OneTouch Delica Lancets 96E MISC 1 each by Does not apply route 2 (two) times daily before a meal. 100 each 0  . spironolactone (ALDACTONE) 25 MG tablet Take 1 tablet (25 mg total) by mouth daily. 90 tablet 1  . warfarin (COUMADIN) 4 MG tablet Take 1 tablet (4 mg total) by mouth one time only at 6 PM. Use as directed. 30 tablet 1   No current facility-administered medications on file prior to visit.    Allergies  Allergen Reactions  . Bee Venom Shortness Of Breath and Swelling  . Iodinated Diagnostic Agents Itching  . Penicillins Swelling  . Tape     Pulls the skin    Recent Results (from the past 2160 hour(s))  POCT INR     Status: None   Collection Time: 01/27/19 12:00 AM  Result Value Ref Range   INR 2.0 2.0 - 3.0    Comment: self-tester  POCT INR     Status: None   Collection Time: 02/03/19 12:00 AM  Result Value Ref Range   INR 2.1 2.0 - 3.0  POCT INR  Status: None   Collection Time: 02/10/19 12:00 AM  Result Value Ref Range   INR 2.0 2.0 - 3.0    Comment: self tester  POCT INR     Status: Abnormal   Collection Time: 02/17/19 12:00 AM  Result Value Ref Range   INR 1.9 (A) 2.0 - 3.0    Comment: self-tester  POCT INR     Status: Abnormal   Collection Time: 02/24/19 12:00 AM  Result Value Ref Range   INR 1.9 (A) 2.0 - 3.0    Comment: self tester  POCT INR     Status: None   Collection Time: 03/05/19 12:00 AM  Result Value Ref Range   INR 2.3 2.0 - 3.0    Comment: self-test  POCT INR     Status: None   Collection Time: 03/10/19 12:00 AM  Result Value Ref Range   INR 2.4 2.0 - 3.0  POCT INR     Status: None   Collection Time: 03/18/19 12:00 AM  Result Value Ref Range   INR 2.2  2.0 - 3.0    Comment: selt-test  POCT INR     Status: None   Collection Time: 03/24/19 12:00 AM  Result Value Ref Range   INR 2.1 2.0 - 3.0  Brain natriuretic peptide     Status: Abnormal   Collection Time: 03/27/19 12:00 AM  Result Value Ref Range   BNP 268.8 (H) 0.0 - 100.0 pg/mL  Lipid panel     Status: None   Collection Time: 03/27/19 12:00 AM  Result Value Ref Range   Cholesterol, Total 129 100 - 199 mg/dL   Triglycerides 79 0 - 149 mg/dL   HDL 62 >39 mg/dL   VLDL Cholesterol Cal 16 5 - 40 mg/dL   LDL Calculated 51 0 - 99 mg/dL   Chol/HDL Ratio 2.1 0.0 - 4.4 ratio    Comment:                                   T. Chol/HDL Ratio                                             Men  Women                               1/2 Avg.Risk  3.4    3.3                                   Avg.Risk  5.0    4.4                                2X Avg.Risk  9.6    7.1                                3X Avg.Risk 23.4   11.0   Comprehensive metabolic panel     Status: Abnormal   Collection Time: 03/27/19 12:00 AM  Result Value Ref Range   Glucose 119 (H) 65 - 99 mg/dL   BUN  23 8 - 27 mg/dL   Creatinine, Ser 0.95 0.57 - 1.00 mg/dL   GFR calc non Af Amer 56 (L) >59 mL/min/1.73   GFR calc Af Amer 65 >59 mL/min/1.73   BUN/Creatinine Ratio 24 12 - 28   Sodium 145 (H) 134 - 144 mmol/L   Potassium 4.2 3.5 - 5.2 mmol/L   Chloride 101 96 - 106 mmol/L   CO2 26 20 - 29 mmol/L   Calcium 9.4 8.7 - 10.3 mg/dL   Total Protein 6.4 6.0 - 8.5 g/dL   Albumin 4.2 3.6 - 4.6 g/dL   Globulin, Total 2.2 1.5 - 4.5 g/dL   Albumin/Globulin Ratio 1.9 1.2 - 2.2   Bilirubin Total 0.9 0.0 - 1.2 mg/dL   Alkaline Phosphatase 75 39 - 117 IU/L   AST 19 0 - 40 IU/L   ALT 17 0 - 32 IU/L  POCT INR     Status: None   Collection Time: 03/31/19 12:00 AM  Result Value Ref Range   INR 2.1 2.0 - 3.0    Comment: selt-test  POCT INR     Status: None   Collection Time: 04/08/19 12:00 AM  Result Value Ref Range   INR 2.9 2.0 - 3.0   Basic metabolic panel     Status: Abnormal   Collection Time: 04/10/19  8:38 AM  Result Value Ref Range   Glucose 128 (H) 65 - 99 mg/dL   BUN 16 8 - 27 mg/dL   Creatinine, Ser 0.87 0.57 - 1.00 mg/dL   GFR calc non Af Amer 63 >59 mL/min/1.73   GFR calc Af Amer 72 >59 mL/min/1.73   BUN/Creatinine Ratio 18 12 - 28   Sodium 140 134 - 144 mmol/L   Potassium 4.0 3.5 - 5.2 mmol/L   Chloride 103 96 - 106 mmol/L   CO2 27 20 - 29 mmol/L   Calcium 9.2 8.7 - 10.3 mg/dL  POCT INR     Status: None   Collection Time: 04/14/19 12:00 AM  Result Value Ref Range   INR 3.0 2.0 - 3.0    Objective: General: Patient is awake, alert, and oriented x 3 and in no acute distress.  Integument: Skin is warm, dry and supple bilateral. Nails are tender, long, thickened and dystrophic with subungual debris, consistent with onychomycosis, 1-5 bilateral.  No acute signs of infection. No open lesions or preulcerative lesions present bilateral. Remaining integument unremarkable.  Vasculature:  Dorsalis Pedis pulse 1/4 bilateral. Posterior Tibial pulse  0/4 bilateral.  Capillary fill time <5 sec 1-5 bilateral. Diminished hair growth to the level of the digits. Temperature gradient within normal limits.  Moderate varicosities and venous stasis hyperpigmentation with trace edema bilateral.   Neurology: The patient has diminished sensation measured with a 5.07/10g Semmes Weinstein Monofilament at all pedal sites bilateral . Vibratory sensation diminished bilateral with tuning fork. No Babinski sign present bilateral.   Musculoskeletal: No tenderness bilateral. Muscular strength 5/5 in all lower extremity muscular groups bilateral without pain on range of motion. No tenderness with calf compression bilateral. Asymptomatic Pes planus, bunion and hammertoes bilateral.  Assessment and Plan: Problem List Items Addressed This Visit    None    Visit Diagnoses    Pain due to onychomycosis of toenails of both feet    -   Primary   Type 2 diabetes, controlled, with neuropathy ([place])       PVD (peripheral vascular disease) ([place])         -Examined patient. -Discussed  and educated patient on diabetic foot care, especially with regards to the vascular, neurological and musculoskeletal systems.  -Mechanically debrided all nails 1-5 bilateral using sterile nail nipper and filed with dremel without incident  -Patient to return  in 3 months for at risk foot care  -Patient advised to call the office if any problems or questions arise in the meantime.  Landis Martins, DPM

## 2019-04-22 LAB — POCT INR: INR: 1.8 — AB (ref 2.0–3.0)

## 2019-04-23 ENCOUNTER — Ambulatory Visit (INDEPENDENT_AMBULATORY_CARE_PROVIDER_SITE_OTHER): Payer: Medicare Other | Admitting: Pharmacist

## 2019-04-23 DIAGNOSIS — Z7901 Long term (current) use of anticoagulants: Secondary | ICD-10-CM

## 2019-04-23 DIAGNOSIS — I482 Chronic atrial fibrillation, unspecified: Secondary | ICD-10-CM

## 2019-04-28 ENCOUNTER — Ambulatory Visit (INDEPENDENT_AMBULATORY_CARE_PROVIDER_SITE_OTHER): Payer: Medicare Other | Admitting: Pharmacist Clinician (PhC)/ Clinical Pharmacy Specialist

## 2019-04-28 DIAGNOSIS — I482 Chronic atrial fibrillation, unspecified: Secondary | ICD-10-CM | POA: Diagnosis not present

## 2019-04-28 DIAGNOSIS — Z7901 Long term (current) use of anticoagulants: Secondary | ICD-10-CM | POA: Diagnosis not present

## 2019-04-28 DIAGNOSIS — I4821 Permanent atrial fibrillation: Secondary | ICD-10-CM | POA: Diagnosis not present

## 2019-04-28 LAB — POCT INR: INR: 1.9 — AB (ref 2.0–3.0)

## 2019-05-07 ENCOUNTER — Ambulatory Visit (INDEPENDENT_AMBULATORY_CARE_PROVIDER_SITE_OTHER): Payer: Medicare Other | Admitting: Cardiology

## 2019-05-07 DIAGNOSIS — I482 Chronic atrial fibrillation, unspecified: Secondary | ICD-10-CM

## 2019-05-07 DIAGNOSIS — Z7901 Long term (current) use of anticoagulants: Secondary | ICD-10-CM | POA: Diagnosis not present

## 2019-05-07 LAB — POCT INR: INR: 3.1 — AB (ref 2.0–3.0)

## 2019-05-13 LAB — POCT INR: INR: 2.7 (ref 2.0–3.0)

## 2019-05-14 ENCOUNTER — Ambulatory Visit (INDEPENDENT_AMBULATORY_CARE_PROVIDER_SITE_OTHER): Payer: Medicare Other | Admitting: Cardiovascular Disease

## 2019-05-14 DIAGNOSIS — I482 Chronic atrial fibrillation, unspecified: Secondary | ICD-10-CM

## 2019-05-14 DIAGNOSIS — Z7901 Long term (current) use of anticoagulants: Secondary | ICD-10-CM | POA: Diagnosis not present

## 2019-05-17 ENCOUNTER — Other Ambulatory Visit: Payer: Self-pay | Admitting: Family Medicine

## 2019-05-19 ENCOUNTER — Ambulatory Visit: Payer: Medicare Other | Admitting: Cardiovascular Disease

## 2019-05-26 ENCOUNTER — Other Ambulatory Visit: Payer: Self-pay | Admitting: Family Medicine

## 2019-05-27 ENCOUNTER — Ambulatory Visit (INDEPENDENT_AMBULATORY_CARE_PROVIDER_SITE_OTHER): Payer: Medicare Other | Admitting: Cardiovascular Disease

## 2019-05-27 DIAGNOSIS — I482 Chronic atrial fibrillation, unspecified: Secondary | ICD-10-CM | POA: Diagnosis not present

## 2019-05-27 DIAGNOSIS — Z7901 Long term (current) use of anticoagulants: Secondary | ICD-10-CM

## 2019-05-27 LAB — POCT INR: INR: 2.6 (ref 2.0–3.0)

## 2019-06-05 ENCOUNTER — Ambulatory Visit (INDEPENDENT_AMBULATORY_CARE_PROVIDER_SITE_OTHER): Payer: Medicare Other | Admitting: Pharmacist Clinician (PhC)/ Clinical Pharmacy Specialist

## 2019-06-05 DIAGNOSIS — Z7901 Long term (current) use of anticoagulants: Secondary | ICD-10-CM

## 2019-06-05 DIAGNOSIS — I482 Chronic atrial fibrillation, unspecified: Secondary | ICD-10-CM | POA: Diagnosis not present

## 2019-06-05 LAB — POCT INR: INR: 2.9 (ref 2.0–3.0)

## 2019-06-12 ENCOUNTER — Ambulatory Visit (INDEPENDENT_AMBULATORY_CARE_PROVIDER_SITE_OTHER): Payer: Medicare Other | Admitting: Pharmacist

## 2019-06-12 DIAGNOSIS — I482 Chronic atrial fibrillation, unspecified: Secondary | ICD-10-CM | POA: Diagnosis not present

## 2019-06-12 DIAGNOSIS — Z7901 Long term (current) use of anticoagulants: Secondary | ICD-10-CM | POA: Diagnosis not present

## 2019-06-12 LAB — POCT INR: INR: 2.8 (ref 2.0–3.0)

## 2019-06-20 ENCOUNTER — Other Ambulatory Visit: Payer: Self-pay | Admitting: Family Medicine

## 2019-06-20 ENCOUNTER — Other Ambulatory Visit: Payer: Self-pay

## 2019-06-20 LAB — POCT INR

## 2019-06-20 MED ORDER — ONETOUCH DELICA PLUS LANCET30G MISC: 1.0000 | Freq: Two times a day (BID) | 0 refills | Status: DC

## 2019-06-22 ENCOUNTER — Ambulatory Visit (INDEPENDENT_AMBULATORY_CARE_PROVIDER_SITE_OTHER): Payer: Medicare Other | Admitting: Pharmacist Clinician (PhC)/ Clinical Pharmacy Specialist

## 2019-06-22 DIAGNOSIS — Z7901 Long term (current) use of anticoagulants: Secondary | ICD-10-CM

## 2019-06-22 DIAGNOSIS — I482 Chronic atrial fibrillation, unspecified: Secondary | ICD-10-CM | POA: Diagnosis not present

## 2019-06-22 LAB — POCT INR: INR: 2.3 (ref 2.0–3.0)

## 2019-06-28 LAB — POCT INR: INR: 2.6 (ref 2.0–3.0)

## 2019-06-29 ENCOUNTER — Ambulatory Visit (INDEPENDENT_AMBULATORY_CARE_PROVIDER_SITE_OTHER): Payer: Medicare Other | Admitting: Pharmacist Clinician (PhC)/ Clinical Pharmacy Specialist

## 2019-06-29 DIAGNOSIS — I482 Chronic atrial fibrillation, unspecified: Secondary | ICD-10-CM | POA: Diagnosis not present

## 2019-06-29 DIAGNOSIS — Z7901 Long term (current) use of anticoagulants: Secondary | ICD-10-CM

## 2019-06-30 ENCOUNTER — Telehealth: Payer: Self-pay | Admitting: Emergency Medicine

## 2019-06-30 NOTE — Telephone Encounter (Signed)
Spoke with pt, she would like to know if RB could send in a Rx for her to discontinue oxygen because she states she doesn't use it anymore. She had a sleep study back in February and it showed she had sleep apnea but never followed up on it. She stated that she remembers someone telling her that she needed to have another study done to see what pressures she needed. RB please advise what the next step should be.        Assessment & Plan Note by Collene Gobble, MD at 12/01/2018 2:05 PM Author: Collene Gobble, MD Author Type: Physician Filed: 12/01/2018 2:05 PM  Note Status: Written Cosign: Cosign Not Required Encounter Date: 12/01/2018  Problem: Respiratory failure with hypoxia [facility])  Editor: Collene Gobble, MD (Physician)    Oxygenation improved post hospitalization for CHF.  If she is agreeable we can discontinue her supplemental oxygen.    Assessment & Plan Note by Collene Gobble, MD at 12/01/2018 2:04 PM Author: Collene Gobble, MD Author Type: Physician Filed: 12/01/2018 2:05 PM  Note Status: Written Cosign: Cosign Not Required Encounter Date: 12/01/2018  Problem: Pulmonary hypertension assoc with unclear multi-factorial mechanisms [facility])  Editor: Collene Gobble, MD (Physician)    Multifactorial pulmonary hypertension.  Principally related to her left-sided heart disease but now notes she has moderate obstructive sleep apnea.  I think this needs to be treated.  She is willing to do so and I will send her back for a CPAP titration study.  She also had some evidence for mild obstructive lung disease, superimposed with restriction.  I think is reasonable to do a trial of a bronchodilator to see if she gets benefit.  Not clear that there is enough obstruction for her to notice an overall breathing improvement.    Patient Instructions by Collene Gobble, MD at 12/01/2018 1:45 PM

## 2019-07-01 NOTE — Telephone Encounter (Signed)
Called and spoke with pt regarding RB's recommendations. Pt expressed confusion to these recommendations. I suggested to her we set up an office visit with RB, as pt has not discussed CPAP with RB before in the past. Pt agreed to this suggestion and states she is unable to travel to [place] right now, so I offered a telephone visit. Pt agreed to scheduling a telephone appt with RB on 07/07/2019 at 3:00 PM EDT. Appt has been scheduled. Routing this message to RB as an FYI. Nothing further needed at this time.

## 2019-07-01 NOTE — Telephone Encounter (Signed)
We could start her on an auto-titration CPAP (5-20 cm H2O) now based on the January PSg if she is willing to get a machine. Alternatively a formal CPAP titration study at the sleep lab could be ordered - that would give her an opportunity to get some teaching about CPAP, the different masks, etc. Please order whichever she would like to pursue.

## 2019-07-05 DIAGNOSIS — Z7901 Long term (current) use of anticoagulants: Secondary | ICD-10-CM | POA: Diagnosis not present

## 2019-07-05 LAB — POCT INR

## 2019-07-07 ENCOUNTER — Encounter: Payer: Self-pay | Admitting: Emergency Medicine

## 2019-07-07 ENCOUNTER — Other Ambulatory Visit: Payer: Self-pay

## 2019-07-07 ENCOUNTER — Ambulatory Visit (INDEPENDENT_AMBULATORY_CARE_PROVIDER_SITE_OTHER): Payer: Medicare Other | Admitting: Emergency Medicine

## 2019-07-07 DIAGNOSIS — J9691 Respiratory failure, unspecified with hypoxia: Secondary | ICD-10-CM | POA: Diagnosis not present

## 2019-07-07 DIAGNOSIS — G4733 Obstructive sleep apnea (adult) (pediatric): Secondary | ICD-10-CM | POA: Diagnosis not present

## 2019-07-07 NOTE — Progress Notes (Signed)
Virtual Visit via Telephone Note  I connected with Rebekah Stewart on 07/07/19 at  3:00 PM EDT by telephone and verified that I am speaking with the correct person using two identifiers.  Location: Patient: Home Provider: Home Office   I discussed the limitations, risks, security and privacy concerns of performing an evaluation and management service by telephone and the availability of in person appointments. I also discussed with the patient that there may be a patient responsible charge related to this service. The patient expressed understanding and agreed to proceed.   History of Present Illness: 84 yo woman, never smoker with coronary disease, hypertension, atrial fibrillation with diastolic dysfunction (on Coumadin) she has multifactorial secondary pulmonary hypertension that was noted on a hospitalization 08/2018 for decompensated heart failure.  She temporarily required supplemental oxygen.  Part of the evaluation included PSG done on 3/20 that shows moderate OSA and severe nocturnal desaturation.  Her pulmonary function testing shows principally mild restriction, possible superimposed obstruction.   Observations/Objective: Patient has had some confusion regarding her plans to possibly treat her sleep apnea.  A CPAP titration study was never arranged.  She feels pretty well. She does has restless sleep, wakes up frequently. May snore - not sure. She feels that her breathing is doing ok. She is living on her own now in [place]. Her mobility is limited by her knee pain.   We considered trial of albuterol to see if she would benefit -acknowledging that her obstruction was likely mild, unclear whether she would notice a difference in her breathing.   Assessment and Plan:  OSA (obstructive sleep apnea) This is most significant noncardiac contributor to her secondary PAH - she has moderate OSA, nocturnal desaturations.  She had supplemental oxygen at home but has not been using it at  night since I saw her last March.  She is uncertain as to whether she wants to pursue CPAP although I did recommend when she asked which modality I thought would be superior.   She wants to try staying on nocturnal O2. In order to do this she will need to re qualify - needs an ONO on RA. If she desats, which she likely will, then I can renew her nocturnal O2 for her. If she reconsiders and decides that she would try CPAP then she needs a CPAP titration study in the sleep lab.    Follow Up Instructions: 3 months   I discussed the assessment and treatment plan with the patient. The patient was provided an opportunity to ask questions and all were answered. The patient agreed with the plan and demonstrated an understanding of the instructions.   The patient was advised to call back or seek an in-person evaluation if the symptoms worsen or if the condition fails to improve as anticipated.  I provided 23 minutes of non-face-to-face time during this encounter.   Collene Gobble, MD

## 2019-07-07 NOTE — Assessment & Plan Note (Signed)
This is most significant noncardiac contributor to her secondary PAH - she has moderate OSA, nocturnal desaturations.  She had supplemental oxygen at home but has not been using it at night since I saw her last March.  She is uncertain as to whether she wants to pursue CPAP although I did recommend when she asked which modality I thought would be superior.   She wants to try staying on nocturnal O2. In order to do this she will need to re qualify - needs an ONO on RA. If she desats, which she likely will, then I can renew her nocturnal O2 for her. If she reconsiders and decides that she would try CPAP then she needs a CPAP titration study in the sleep lab.

## 2019-07-13 ENCOUNTER — Ambulatory Visit (INDEPENDENT_AMBULATORY_CARE_PROVIDER_SITE_OTHER): Payer: Medicare Other | Admitting: Cardiology

## 2019-07-13 DIAGNOSIS — Z7901 Long term (current) use of anticoagulants: Secondary | ICD-10-CM

## 2019-07-13 DIAGNOSIS — I482 Chronic atrial fibrillation, unspecified: Secondary | ICD-10-CM

## 2019-07-13 LAB — POCT INR: INR: 2.7 (ref 2.0–3.0)

## 2019-07-14 ENCOUNTER — Ambulatory Visit (INDEPENDENT_AMBULATORY_CARE_PROVIDER_SITE_OTHER): Payer: Medicare Other | Admitting: Cardiovascular Disease

## 2019-07-14 ENCOUNTER — Other Ambulatory Visit: Payer: Self-pay

## 2019-07-14 ENCOUNTER — Encounter: Payer: Self-pay | Admitting: Cardiovascular Disease

## 2019-07-14 VITALS — BP 132/78 | HR 65 | Temp 96.9°F | Ht 64.0 in | Wt 184.2 lb

## 2019-07-14 DIAGNOSIS — I5032 Chronic diastolic (congestive) heart failure: Secondary | ICD-10-CM

## 2019-07-14 DIAGNOSIS — E785 Hyperlipidemia, unspecified: Secondary | ICD-10-CM

## 2019-07-14 DIAGNOSIS — I482 Chronic atrial fibrillation, unspecified: Secondary | ICD-10-CM

## 2019-07-14 DIAGNOSIS — I10 Essential (primary) hypertension: Secondary | ICD-10-CM

## 2019-07-14 DIAGNOSIS — I2510 Atherosclerotic heart disease of native coronary artery without angina pectoris: Secondary | ICD-10-CM | POA: Diagnosis not present

## 2019-07-14 DIAGNOSIS — I872 Venous insufficiency (chronic) (peripheral): Secondary | ICD-10-CM | POA: Diagnosis not present

## 2019-07-14 LAB — BASIC METABOLIC PANEL
BUN/Creatinine Ratio: 33 — ABNORMAL HIGH (ref 12–28)
BUN: 40 mg/dL — ABNORMAL HIGH (ref 8–27)
CO2: 22 mmol/L (ref 20–29)
Calcium: 9.7 mg/dL (ref 8.7–10.3)
Chloride: 105 mmol/L (ref 96–106)
Creatinine, Ser: 1.22 mg/dL — ABNORMAL HIGH (ref 0.57–1.00)
GFR calc Af Amer: 48 mL/min/{1.73_m2} — ABNORMAL LOW (ref >59)
GFR calc non Af Amer: 41 mL/min/{1.73_m2} — ABNORMAL LOW (ref >59)
Glucose: 104 mg/dL — ABNORMAL HIGH (ref 65–99)
Potassium: 5.3 mmol/L — ABNORMAL HIGH (ref 3.5–5.2)
Sodium: 142 mmol/L (ref 134–144)

## 2019-07-14 LAB — CBC
Hematocrit: 42.5 % (ref 34.0–46.6)
Hemoglobin: 14 g/dL (ref 11.1–15.9)
MCH: 32.4 pg (ref 26.6–33.0)
MCHC: 32.9 g/dL (ref 31.5–35.7)
MCV: 98 fL — ABNORMAL HIGH (ref 79–97)
Platelets: 257 10*3/uL (ref 150–450)
RBC: 4.32 x10E6/uL (ref 3.77–5.28)
RDW: 15.1 % (ref 11.7–15.4)
WBC: 9.8 10*3/uL (ref 3.4–10.8)

## 2019-07-14 NOTE — Progress Notes (Signed)
Cardiology Office Note   Date:  07/14/2019   ID:  Ryane Konieczny, [place] May 04, 1935, MRN [id number]  PCP:  Nicoletta Dress, MD  Cardiologist:   Kathlyn Sacramento, MD   No chief complaint on file.     History of Present Illness: Rebekah Stewart is a 84 y.o. female who presents for  a followup visit regarding chronic atrial fibrillation, refractory hypertension and diastolic heart failure. She had a cardiac catheterization done in 2010 which showed mild to moderate three-vessel nonobstructive coronary artery disease. Worst stenosis was in the mid LAD which was estimated to be 50%. She has multiple chronic medical conditions including Diabetes and hyperlipidemia. She is known to have chronic bilateral leg edema  with stasis dermatitis.  She has secondary lymphedema due to chronic venous insufficiency. Lower extremity arterial Doppler in July showed normal ABI bilaterally. The patient could not afford Eliquis due to cost and she is back on warfarin.    She had previous venous ulceration of the lower extremities.  I prescribed lymphedema pump.   During last visit, I resumed spironolactone given elevated blood pressure.  Since then, her blood pressure has been well controlled.  No chest pain or shortness of breath.  Leg edema improved significantly.    Past Medical History:  Diagnosis Date  . Arthritis of back    lumbar  . Atrial fibrillation ([place]) 2010  . Atrial fibrillation ([place])   . Atrial fibrillation ([place])   . Chronic diastolic heart failure ([place])   . Colonic polyp   . Compression fracture of thoracic vertebra (HCC)   . Congestive heart failure, unspecified   . Coronary artery disease    mild non obstructive disease per cardiac cath in 2010  . Degeneration of lumbar or lumbosacral intervertebral disc   . Diverticulosis   . DM2 (diabetes mellitus, type 2) ([place])   . Hemorrhoids   . HLD (hyperlipidemia)   . HTN (hypertension)    diagnosed when she was in her 61s.  Refractory. No RAS by [place] in 2010  . Long-term (current) use of anticoagulants   . Lumbosacral spondylosis without myelopathy   . Osteoarthrosis, unspecified whether generalized or localized, unspecified site   . Pain in thoracic spine   . Swelling of limb   . UTI (lower urinary tract infection)   . Varicose veins of lower extremities with other complications   . Vitamin D deficiency     Past Surgical History:  Procedure Laterality Date  . ADENOIDECTOMY    . CARDIAC CATHETERIZATION  2010   At [place]. Mild nonobstructive CAD  . CORONARY ANGIOPLASTY    . EYE SURGERY    . TONSILLECTOMY       Current Outpatient Medications  Medication Sig Dispense Refill  . allopurinol (ZYLOPRIM) 300 MG tablet Take 1 tablet (300 mg total) by mouth daily as needed (for gout). 30 tablet 1  . atorvastatin (LIPITOR) 40 MG tablet TAKE 1 TABLET BY MOUTH EVERY DAY 30 tablet 1  . benazepril (LOTENSIN) 20 MG tablet Take 1 tablet (20 mg total) by mouth daily. 30 tablet 1  . carvedilol (COREG) 25 MG tablet TAKE 1 TABLET BY MOUTH TWICE A DAY WITH A MEAL 180 tablet 0  . Cholecalciferol (VITAMIN D) 2000 UNITS CAPS Take 1 capsule by mouth daily.    . fish oil-omega-3 fatty acids 1000 MG capsule Take 2 g by mouth 2 (two) times daily.     . furosemide (LASIX) 20 MG tablet TAKE  1 TABLET BY MOUTH EVERY DAY ALTERNATING 20MG & 40MG 30 tablet 1  . glucose blood (ACCU-CHEK AVIVA PLUS) test strip Check two times daily. Dx code E11.65 100 each 0  . isosorbide mononitrate (IMDUR) 60 MG 24 hr tablet Take 1 tablet (60 mg total) by mouth daily. 30 tablet 1  . Lancets (ONETOUCH DELICA PLUS [id number]) MISC 1 each by Does not apply route 2 (two) times daily before a meal. Use to check blood sugars twilce daily before meal 100 each 0  . metFORMIN (GLUCOPHAGE) 500 MG tablet Take 1 tablet (500 mg total) by mouth 3 (three) times daily with meals. 90 tablet 1  . spironolactone (ALDACTONE) 25 MG tablet Take 1 tablet (25 mg total) by  mouth daily. 90 tablet 1  . warfarin (COUMADIN) 4 MG tablet Take 1 tablet (4 mg total) by mouth one time only at 6 PM. Use as directed. 30 tablet 1   No current facility-administered medications for this visit.     Allergies:   Bee venom, Iodinated diagnostic agents, Penicillins, and Tape    Social History:  The patient  reports that she has never smoked. She has never used smokeless tobacco. She reports that she does not drink alcohol or use drugs.   Family History:  The patient's family history includes Cancer in her sister; Heart attack in her father; Heart disease in her brother, father, mother, and another family member; Stroke in her mother.    ROS:  Please see the history of present illness.   Otherwise, review of systems are positive for none.   All other systems are reviewed and negative.    PHYSICAL EXAM: VS:  BP 132/78   Pulse 65   Temp (!) 96.9 F (36.1 C)   Ht _0  (1.626 m)   Wt 184 lb 3.2 oz (83.6 kg)   SpO2 97%   BMI 31.62 kg/m  , BMI Body mass index is 31.62 kg/m. GEN: Well nourished, well developed, in no acute distress  HEENT: normal  Neck: no JVD, carotid bruits, or masses Cardiac: Irregularly irregular ; no murmurs, rubs, or gallops,  Respiratory:  clear to auscultation bilaterally, normal work of breathing GI: soft, nontender, nondistended, + BS MS: no deformity or atrophy  Skin: warm and dry, no rash Neuro:  Strength and sensation are intact Psych: euthymic mood, full affect Dorsalis pedis is palpable bilaterally.  Trace bilateral leg edema  EKG:  EKG is not ordered today.    Recent Labs: 08/09/2018: Hemoglobin 14.2; Platelets 332; TSH 2.312 08/12/2018: Magnesium 1.7 11/05/2018: Pro B Natriuretic peptide (BNP) 157.0 03/27/2019: ALT 17; BNP 268.8 04/10/2019: BUN 16; Creatinine, Ser 0.87; Potassium 4.0; Sodium 140    Lipid Panel    Component Value Date/Time   CHOL 129 03/27/2019 0000   TRIG 79 03/27/2019 0000   HDL 62 03/27/2019 0000    CHOLHDL 2.1 03/27/2019 0000   LDLCALC 51 03/27/2019 0000      Wt Readings from Last 3 Encounters:  07/14/19 184 lb 3.2 oz (83.6 kg)  03/31/19 191 lb (86.6 kg)  12/01/18 193 lb (87.5 kg)       ASSESSMENT AND PLAN:  1.  Chronic atrial fibrillation:  Ventricular rate is reasonably controlled on carvedilol.  She is on long-term anticoagulation with warfarin.  Check CBC today  2. Essential hypertension: Blood pressure improved significantly with addition of spironolactone.  Repeat basic metabolic profile today.  3. Coronary artery disease involving native coronary arteries with atypical chest pain: Stress  test last year was normal  4. Hyperlipidemia: Continue treatment with atorvastatin.  Most recent LDL was 58.  5. Chronic diastolic heart failure: She appears to be euvolemic on current medications.  6.  Chronic venous insufficiency with secondary lymphedema currently with history venous ulceration.  This has improved significantly with lymphedema pump.  Currently she is not using it given that she did not go back to her house in hospital.  Nonetheless, she has no edema today.   Disposition:   FU with me in 4 months  Signed,  Kathlyn Sacramento, MD  07/14/2019 10:38 AM    [place]

## 2019-07-14 NOTE — Patient Instructions (Signed)
Medication Instructions:  Your physician recommends that you continue on your current medications as directed. Please refer to the Current Medication list given to you today.  If you need a refill on your cardiac medications before your next appointment, please call your pharmacy.   Lab work: Your provider would like for you to have the following labs today: CBC and BMET  If you have labs (blood work) drawn today and your tests are completely normal, you will receive your results only by: [place] (if you have MyChart) OR A paper copy in the mail If you have any lab test that is abnormal or we need to change your treatment, we will call you to review the results.  Testing/Procedures: None ordered  Follow-Up: At [facility], you and your health needs are our priority.  As part of our continuing mission to provide you with exceptional heart care, we have created designated Provider Care Teams.  These Care Teams include your primary Cardiologist (physician) and Advanced Practice Providers (APPs -  Physician Assistants and Nurse Practitioners) who all work together to provide you with the care you need, when you need it. You will need a follow up appointment in 4 months.  Please call our office 2 months in advance to schedule this appointment.  You may see Kathlyn Sacramento, MD or one of the following Advanced Practice Providers on your designated Care Team:   Kerin Ransom, PA-C [address], PA-C [place], [place]

## 2019-07-15 ENCOUNTER — Encounter: Payer: Self-pay | Admitting: Sports Medicine

## 2019-07-15 ENCOUNTER — Ambulatory Visit (INDEPENDENT_AMBULATORY_CARE_PROVIDER_SITE_OTHER): Payer: Medicare Other | Admitting: Sports Medicine

## 2019-07-15 DIAGNOSIS — B351 Tinea unguium: Secondary | ICD-10-CM

## 2019-07-15 DIAGNOSIS — M79674 Pain in right toe(s): Secondary | ICD-10-CM | POA: Diagnosis not present

## 2019-07-15 DIAGNOSIS — M79675 Pain in left toe(s): Secondary | ICD-10-CM

## 2019-07-15 DIAGNOSIS — I739 Peripheral vascular disease, unspecified: Secondary | ICD-10-CM

## 2019-07-15 DIAGNOSIS — E1140 Type 2 diabetes mellitus with diabetic neuropathy, unspecified: Secondary | ICD-10-CM

## 2019-07-15 NOTE — Progress Notes (Signed)
Subjective: Rebekah Stewart is a 84 y.o. female patient with history of diabetes who returns to office today complaining of long, painful nails while ambulating in shoes; unable to trim. Patient states that the glucose reading yesterday was 113 last A1c recorded at 7 and she saw her primary care Dr. [name] a couple of months ago and will see again next week. Patient denies any other pedal complaints.   Patient Active Problem List   Diagnosis Date Noted  . OSA (obstructive sleep apnea) 07/07/2019  . Respiratory failure with hypoxia ([place]) 10/10/2018  . Chronic anticoagulation 08/19/2018  . Non-insulin dependent type 2 diabetes mellitus ([place]) 08/13/2018  . Intertrigo 08/10/2018  . Pulmonary hypertension assoc with unclear multi-factorial mechanisms ([place])   . Acute on chronic diastolic heart failure ([place])   . Flash pulmonary edema ([place]) 08/09/2018  . Varicose veins of lower extremities with complications, bilateral 10/30/2016  . Dyslipidemia 05/21/2014  . Bilateral leg pain 11/26/2012  . Chronic diastolic heart failure ([place])   . Chronic atrial fibrillation ([place])   . Essential hypertension   . Coronary artery disease    Current Outpatient Medications on File Prior to Visit  Medication Sig Dispense Refill  . allopurinol (ZYLOPRIM) 300 MG tablet Take 1 tablet (300 mg total) by mouth daily as needed (for gout). 30 tablet 1  . atorvastatin (LIPITOR) 40 MG tablet TAKE 1 TABLET BY MOUTH EVERY DAY 30 tablet 1  . benazepril (LOTENSIN) 20 MG tablet Take 1 tablet (20 mg total) by mouth daily. 30 tablet 1  . carvedilol (COREG) 25 MG tablet TAKE 1 TABLET BY MOUTH TWICE A DAY WITH A MEAL 180 tablet 0  . Cholecalciferol (VITAMIN D) 2000 UNITS CAPS Take 1 capsule by mouth daily.    . fish oil-omega-3 fatty acids 1000 MG capsule Take 2 g by mouth 2 (two) times daily.     . furosemide (LASIX) 20 MG tablet TAKE 1 TABLET BY MOUTH EVERY DAY ALTERNATING 20MG & 40MG 30 tablet 1  . glucose blood (ACCU-CHEK  AVIVA PLUS) test strip Check two times daily. Dx code E11.65 100 each 0  . isosorbide mononitrate (IMDUR) 60 MG 24 hr tablet Take 1 tablet (60 mg total) by mouth daily. 30 tablet 1  . Lancets (ONETOUCH DELICA PLUS [id number]) MISC 1 each by Does not apply route 2 (two) times daily before a meal. Use to check blood sugars twilce daily before meal 100 each 0  . metFORMIN (GLUCOPHAGE) 500 MG tablet Take 1 tablet (500 mg total) by mouth 3 (three) times daily with meals. 90 tablet 1  . spironolactone (ALDACTONE) 25 MG tablet Take 1 tablet (25 mg total) by mouth daily. 90 tablet 1  . warfarin (COUMADIN) 4 MG tablet Take 1 tablet (4 mg total) by mouth one time only at 6 PM. Use as directed. 30 tablet 1   No current facility-administered medications on file prior to visit.    Allergies  Allergen Reactions  . Bee Venom Shortness Of Breath and Swelling  . Iodinated Diagnostic Agents Itching  . Penicillins Swelling  . Tape     Pulls the skin    Recent Results (from the past 2160 hour(s))  POCT INR     Status: Abnormal   Collection Time: 04/22/19 12:00 AM  Result Value Ref Range   INR 1.8 (A) 2.0 - 3.0    Comment: self-test  POCT INR     Status: Abnormal   Collection Time: 04/28/19 12:00 AM  Result Value Ref  Range   INR 1.9 (A) 2.0 - 3.0  POCT INR     Status: Abnormal   Collection Time: 05/07/19 12:00 AM  Result Value Ref Range   INR 3.1 (A) 2.0 - 3.0  POCT INR     Status: None   Collection Time: 05/13/19 12:00 AM  Result Value Ref Range   INR 2.7 2.0 - 3.0  POCT INR     Status: None   Collection Time: 05/27/19 12:00 AM  Result Value Ref Range   INR 2.6 2.0 - 3.0  POCT INR     Status: None   Collection Time: 06/05/19 12:00 AM  Result Value Ref Range   INR 2.9 2.0 - 3.0  POCT INR     Status: None   Collection Time: 06/12/19 12:00 AM  Result Value Ref Range   INR 2.8 2.0 - 3.0  POCT INR     Status: None   Collection Time: 06/22/19 12:00 AM  Result Value Ref Range   INR 2.3 2.0 -  3.0  POCT INR     Status: None   Collection Time: 06/28/19 12:00 AM  Result Value Ref Range   INR 2.6 2.0 - 3.0  POCT INR     Status: None   Collection Time: 07/05/19  3:05 PM  Result Value Ref Range   INR    POCT INR     Status: None   Collection Time: 07/13/19 12:00 AM  Result Value Ref Range   INR 2.7 2.0 - 3.0  Basic metabolic panel     Status: Abnormal   Collection Time: 07/14/19 11:05 AM  Result Value Ref Range   Glucose 104 (H) 65 - 99 mg/dL   BUN 40 (H) 8 - 27 mg/dL   Creatinine, Ser 1.22 (H) 0.57 - 1.00 mg/dL   GFR calc non Af Amer 41 (L) >59 mL/min/1.73   GFR calc Af Amer 48 (L) >59 mL/min/1.73   BUN/Creatinine Ratio 33 (H) 12 - 28   Sodium 142 134 - 144 mmol/L   Potassium 5.3 (H) 3.5 - 5.2 mmol/L   Chloride 105 96 - 106 mmol/L   CO2 22 20 - 29 mmol/L   Calcium 9.7 8.7 - 10.3 mg/dL  CBC     Status: Abnormal   Collection Time: 07/14/19 11:05 AM  Result Value Ref Range   WBC 9.8 3.4 - 10.8 x10E3/uL   RBC 4.32 3.77 - 5.28 x10E6/uL   Hemoglobin 14.0 11.1 - 15.9 g/dL   Hematocrit 42.5 34.0 - 46.6 %   MCV 98 (H) 79 - 97 fL   MCH 32.4 26.6 - 33.0 pg   MCHC 32.9 31.5 - 35.7 g/dL   RDW 15.1 11.7 - 15.4 %   Platelets 257 150 - 450 x10E3/uL    Objective: General: Patient is awake, alert, and oriented x 3 and in no acute distress.  Integument: Skin is warm, dry and supple bilateral. Nails are tender, long, thickened and dystrophic with subungual debris, consistent with onychomycosis, 1-5 bilateral.  No acute signs of infection. No open lesions or preulcerative lesions present bilateral. Remaining integument unremarkable.  Vasculature:  Dorsalis Pedis pulse 1/4 bilateral. Posterior Tibial pulse  0/4 bilateral.  Capillary fill time <5 sec 1-5 bilateral. Diminished hair growth to the level of the digits. Temperature gradient within normal limits.  Moderate varicosities and venous stasis hyperpigmentation with trace edema bilateral.   Neurology: The patient has diminished  sensation measured with a 5.07/10g Semmes Weinstein Monofilament at all  pedal sites bilateral . Vibratory sensation diminished bilateral with tuning fork. No Babinski sign present bilateral.   Musculoskeletal: No tenderness bilateral. Muscular strength 5/5 in all lower extremity muscular groups bilateral without pain on range of motion. No tenderness with calf compression bilateral. Asymptomatic Pes planus, bunion and hammertoes bilateral.  Assessment and Plan: Problem List Items Addressed This Visit    None    Visit Diagnoses    Pain due to onychomycosis of toenails of both feet    -  Primary   Type 2 diabetes, controlled, with neuropathy (HCC)       PVD (peripheral vascular disease) ([place])         -Examined patient. -Re-discussed and educated patient on diabetic foot care, especially with regards to the vascular, neurological and musculoskeletal systems.  -Mechanically debrided all nails 1-5 bilateral using sterile nail nipper and filed with dremel without incident  -Patient was measured today for diabetic shoes with Liliane Channel -Patient to return  in 3 months for at risk foot care  -Patient advised to call the office if any problems or questions arise in the meantime.  Landis Martins, DPM

## 2019-07-16 ENCOUNTER — Other Ambulatory Visit: Payer: Self-pay | Admitting: *Deleted

## 2019-07-16 MED ORDER — FUROSEMIDE 20 MG PO TABS: 20.0000 mg | ORAL_TABLET | Freq: Every day | ORAL | 1 refills | Status: DC

## 2019-07-17 ENCOUNTER — Ambulatory Visit: Payer: Medicare Other | Admitting: Sports Medicine

## 2019-07-20 ENCOUNTER — Encounter: Payer: Self-pay | Admitting: *Deleted

## 2019-07-20 ENCOUNTER — Ambulatory Visit (INDEPENDENT_AMBULATORY_CARE_PROVIDER_SITE_OTHER): Payer: Medicare Other | Admitting: Pharmacist

## 2019-07-20 DIAGNOSIS — Z7901 Long term (current) use of anticoagulants: Secondary | ICD-10-CM | POA: Diagnosis not present

## 2019-07-20 DIAGNOSIS — I482 Chronic atrial fibrillation, unspecified: Secondary | ICD-10-CM

## 2019-07-20 LAB — POCT INR: INR: 2.2 (ref 2.0–3.0)

## 2019-07-21 DIAGNOSIS — E1165 Type 2 diabetes mellitus with hyperglycemia: Secondary | ICD-10-CM | POA: Diagnosis not present

## 2019-07-21 DIAGNOSIS — I110 Hypertensive heart disease with heart failure: Secondary | ICD-10-CM | POA: Diagnosis not present

## 2019-07-21 DIAGNOSIS — E559 Vitamin D deficiency, unspecified: Secondary | ICD-10-CM | POA: Diagnosis not present

## 2019-07-21 DIAGNOSIS — E1142 Type 2 diabetes mellitus with diabetic polyneuropathy: Secondary | ICD-10-CM | POA: Diagnosis not present

## 2019-07-21 DIAGNOSIS — Z23 Encounter for immunization: Secondary | ICD-10-CM | POA: Diagnosis not present

## 2019-07-21 DIAGNOSIS — Z1331 Encounter for screening for depression: Secondary | ICD-10-CM | POA: Diagnosis not present

## 2019-07-21 DIAGNOSIS — E785 Hyperlipidemia, unspecified: Secondary | ICD-10-CM | POA: Diagnosis not present

## 2019-07-21 DIAGNOSIS — Z7901 Long term (current) use of anticoagulants: Secondary | ICD-10-CM | POA: Diagnosis not present

## 2019-07-21 DIAGNOSIS — Z79899 Other long term (current) drug therapy: Secondary | ICD-10-CM | POA: Diagnosis not present

## 2019-07-21 DIAGNOSIS — Z9181 History of falling: Secondary | ICD-10-CM | POA: Diagnosis not present

## 2019-07-21 DIAGNOSIS — M10072 Idiopathic gout, left ankle and foot: Secondary | ICD-10-CM | POA: Diagnosis not present

## 2019-07-27 ENCOUNTER — Other Ambulatory Visit: Payer: Self-pay | Admitting: Family Medicine

## 2019-07-28 ENCOUNTER — Other Ambulatory Visit: Payer: Self-pay

## 2019-07-28 ENCOUNTER — Ambulatory Visit (INDEPENDENT_AMBULATORY_CARE_PROVIDER_SITE_OTHER): Payer: Medicare Other | Admitting: Pharmacist

## 2019-07-28 DIAGNOSIS — I482 Chronic atrial fibrillation, unspecified: Secondary | ICD-10-CM

## 2019-07-28 DIAGNOSIS — Z7901 Long term (current) use of anticoagulants: Secondary | ICD-10-CM

## 2019-07-28 LAB — POCT INR: INR: 2.3 (ref 2.0–3.0)

## 2019-07-30 DIAGNOSIS — Z1231 Encounter for screening mammogram for malignant neoplasm of breast: Secondary | ICD-10-CM | POA: Diagnosis not present

## 2019-07-30 DIAGNOSIS — Z0000 Encounter for general adult medical examination without abnormal findings: Secondary | ICD-10-CM | POA: Diagnosis not present

## 2019-07-30 DIAGNOSIS — Z6833 Body mass index (BMI) 33.0-33.9, adult: Secondary | ICD-10-CM | POA: Diagnosis not present

## 2019-07-30 DIAGNOSIS — Z1331 Encounter for screening for depression: Secondary | ICD-10-CM | POA: Diagnosis not present

## 2019-07-30 DIAGNOSIS — E785 Hyperlipidemia, unspecified: Secondary | ICD-10-CM | POA: Diagnosis not present

## 2019-07-30 DIAGNOSIS — N959 Unspecified menopausal and perimenopausal disorder: Secondary | ICD-10-CM | POA: Diagnosis not present

## 2019-07-30 DIAGNOSIS — Z9181 History of falling: Secondary | ICD-10-CM | POA: Diagnosis not present

## 2019-08-03 ENCOUNTER — Ambulatory Visit (INDEPENDENT_AMBULATORY_CARE_PROVIDER_SITE_OTHER): Payer: Medicare Other | Admitting: Cardiology

## 2019-08-03 DIAGNOSIS — Z7901 Long term (current) use of anticoagulants: Secondary | ICD-10-CM

## 2019-08-03 DIAGNOSIS — I482 Chronic atrial fibrillation, unspecified: Secondary | ICD-10-CM

## 2019-08-03 LAB — POCT INR: INR: 2.7 (ref 2.0–3.0)

## 2019-08-05 ENCOUNTER — Other Ambulatory Visit: Payer: Self-pay | Admitting: *Deleted

## 2019-08-05 NOTE — Patient Outreach (Signed)
[facility]) Care Management  08/05/2019  Rebekah Stewart July 11, 1935 [id number]   Telephone Screen  Referral Date:  07/28/2019 Referral Source:  EMMI Prevent Reason for Referral:  Screening Insurance:  Medicare   Outreach Attempt:  Outreach attempt #1 to patient for telephone screening post EMMI Prevent call. No answer. RN Health Coach left HIPAA compliant voicemail message along with contact information.  Plan:  RN Health Coach will send unsuccessful outreach letter to patient.  RN Health Coach will make another outreach attempt to patient within 3-4 business days if no return call back from patient.   [place] [phone] [email or web address] [email or web address]

## 2019-08-10 ENCOUNTER — Other Ambulatory Visit: Payer: Self-pay | Admitting: Family Medicine

## 2019-08-10 ENCOUNTER — Encounter (INDEPENDENT_AMBULATORY_CARE_PROVIDER_SITE_OTHER): Payer: Medicare Other | Admitting: Ophthalmology

## 2019-08-10 ENCOUNTER — Other Ambulatory Visit: Payer: Self-pay | Admitting: *Deleted

## 2019-08-10 MED ORDER — ONETOUCH DELICA PLUS LANCET30G MISC: 1.0000 | Freq: Two times a day (BID) | 0 refills | Status: AC

## 2019-08-10 NOTE — Telephone Encounter (Signed)
Medication Refill - Medication:  Lancets (ONETOUCH DELICA PLUS [id number]) MISC   Has the patient contacted their pharmacy?  Yes advised to call.   Preferred Pharmacy (with phone number or street name):  [id number] [place], [phone](Phone) [phone](Fax)     Agent: Please be advised that RX refills may take up to 3 business days. We ask that you follow-up with your pharmacy.

## 2019-08-10 NOTE — Patient Outreach (Signed)
[place] [facility]) Care Management  08/10/2019  Rebekah Stewart [birth year] [id number]   Telephone Screen  Referral Date:  07/28/2019 Referral Source:  EMMI Prevent Reason for Referral:  Screening Insurance:  Medicare   Outreach Attempt:  Outreach attempt #2 to patient for telephone screening. No answer and unable to leave voicemail message due to voicemail box being full.  Plan:  RN Health Coach will make another outreach attempt to patient within 3-4 business days.  [place] [phone] [email or web address]

## 2019-08-11 ENCOUNTER — Ambulatory Visit: Payer: Self-pay | Admitting: Pharmacist

## 2019-08-11 LAB — POCT INR: INR: 2.6 (ref 2.0–3.0)

## 2019-08-12 ENCOUNTER — Other Ambulatory Visit: Payer: Self-pay | Admitting: *Deleted

## 2019-08-12 NOTE — Patient Outreach (Signed)
[place] [facility]) Care Management  08/12/2019  Rebekah Stewart 12-Aug-1935 [id number]   Telephone Screen  Referral Date:07/28/2019 Referral Source:EMMI Prevent Reason for Referral:Screening Insurance:Medicare   Outreach Attempt:  Outreach attempt #3 to patient for telephone screening. No answer and unable to leave voicemail message due to voicemail box is full.  Plan:  RN Health Coach will close case if no return call from patient within 10 day time period from Unsuccessful Letter being mailed to patient.  [place] [phone] [name]

## 2019-08-13 LAB — PULMONARY FUNCTION TEST
DL/VA % pred: 84 %
DL/VA: 3.45 ml/min/mmHg/L
DLCO unc % pred: 70 %
DLCO unc: 13.2 ml/min/mmHg
FEF 25-75 Post: 0.68 L/sec
FEF 25-75 Pre: 1.09 L/sec
FEF2575-%Change-Post: -37 %
FEF2575-%Pred-Post: 49 %
FEF2575-%Pred-Pre: 80 %
FEV1-%Change-Post: -7 %
FEV1-%Pred-Post: 71 %
FEV1-%Pred-Pre: 76 %
FEV1-Post: 1.35 L
FEV1-Pre: 1.47 L
FEV1FVC-%Change-Post: 0 %
FEV1FVC-%Pred-Pre: 97 %
FEV6-%Change-Post: -7 %
FEV6-%Pred-Post: 77 %
FEV6-%Pred-Pre: 83 %
FEV6-Post: 1.87 L
FEV6-Pre: 2.02 L
FEV6FVC-%Change-Post: 0 %
FEV6FVC-%Pred-Post: 104 %
FEV6FVC-%Pred-Pre: 104 %
FVC-%Change-Post: -7 %
FVC-%Pred-Post: 73 %
FVC-%Pred-Pre: 79 %
FVC-Post: 1.9 L
FVC-Pre: 2.04 L
Post FEV1/FVC ratio: 71 %
Post FEV6/FVC ratio: 99 %
Pre FEV1/FVC ratio: 72 %
Pre FEV6/FVC Ratio: 99 %
RV % pred: 98 %
RV: 2.38 L
TLC % pred: 85 %
TLC: 4.34 L

## 2019-08-18 ENCOUNTER — Other Ambulatory Visit: Payer: Self-pay | Admitting: *Deleted

## 2019-08-18 NOTE — Patient Outreach (Signed)
[place] [facility]) Care Management  08/18/2019  Rebekah Stewart 1935-01-10 [id number]   Telephone Screen/Case Closure  Referral Date:07/28/2019 Referral Source:EMMI Prevent Reason for Referral:Screening Insurance:Medicare   Outreach Attempt:  Multiple attempts to establish contact with patient without success. No response from letter mailed to patient. Case is being closed at this time.   Plan:  RN Health Coach will close case and make patient inactive due to inability to establish contact with patient.  [place] [phone] [email or web address] [email or web address]

## 2019-08-20 LAB — POCT INR: INR: 3.4 — AB (ref 2.0–3.0)

## 2019-08-21 ENCOUNTER — Ambulatory Visit (INDEPENDENT_AMBULATORY_CARE_PROVIDER_SITE_OTHER): Payer: Medicare Other | Admitting: Pharmacist Clinician (PhC)/ Clinical Pharmacy Specialist

## 2019-08-21 DIAGNOSIS — I482 Chronic atrial fibrillation, unspecified: Secondary | ICD-10-CM | POA: Diagnosis not present

## 2019-08-21 DIAGNOSIS — Z7901 Long term (current) use of anticoagulants: Secondary | ICD-10-CM

## 2019-08-21 IMAGING — DX DG CHEST 2V
2 series · 2 of 2 positions shown · non-contrast
Comparison: 06/06/2018

CLINICAL DATA: Shortness of breath and dyspnea.

EXAM:
CHEST - 2 VIEW

[chest lat]
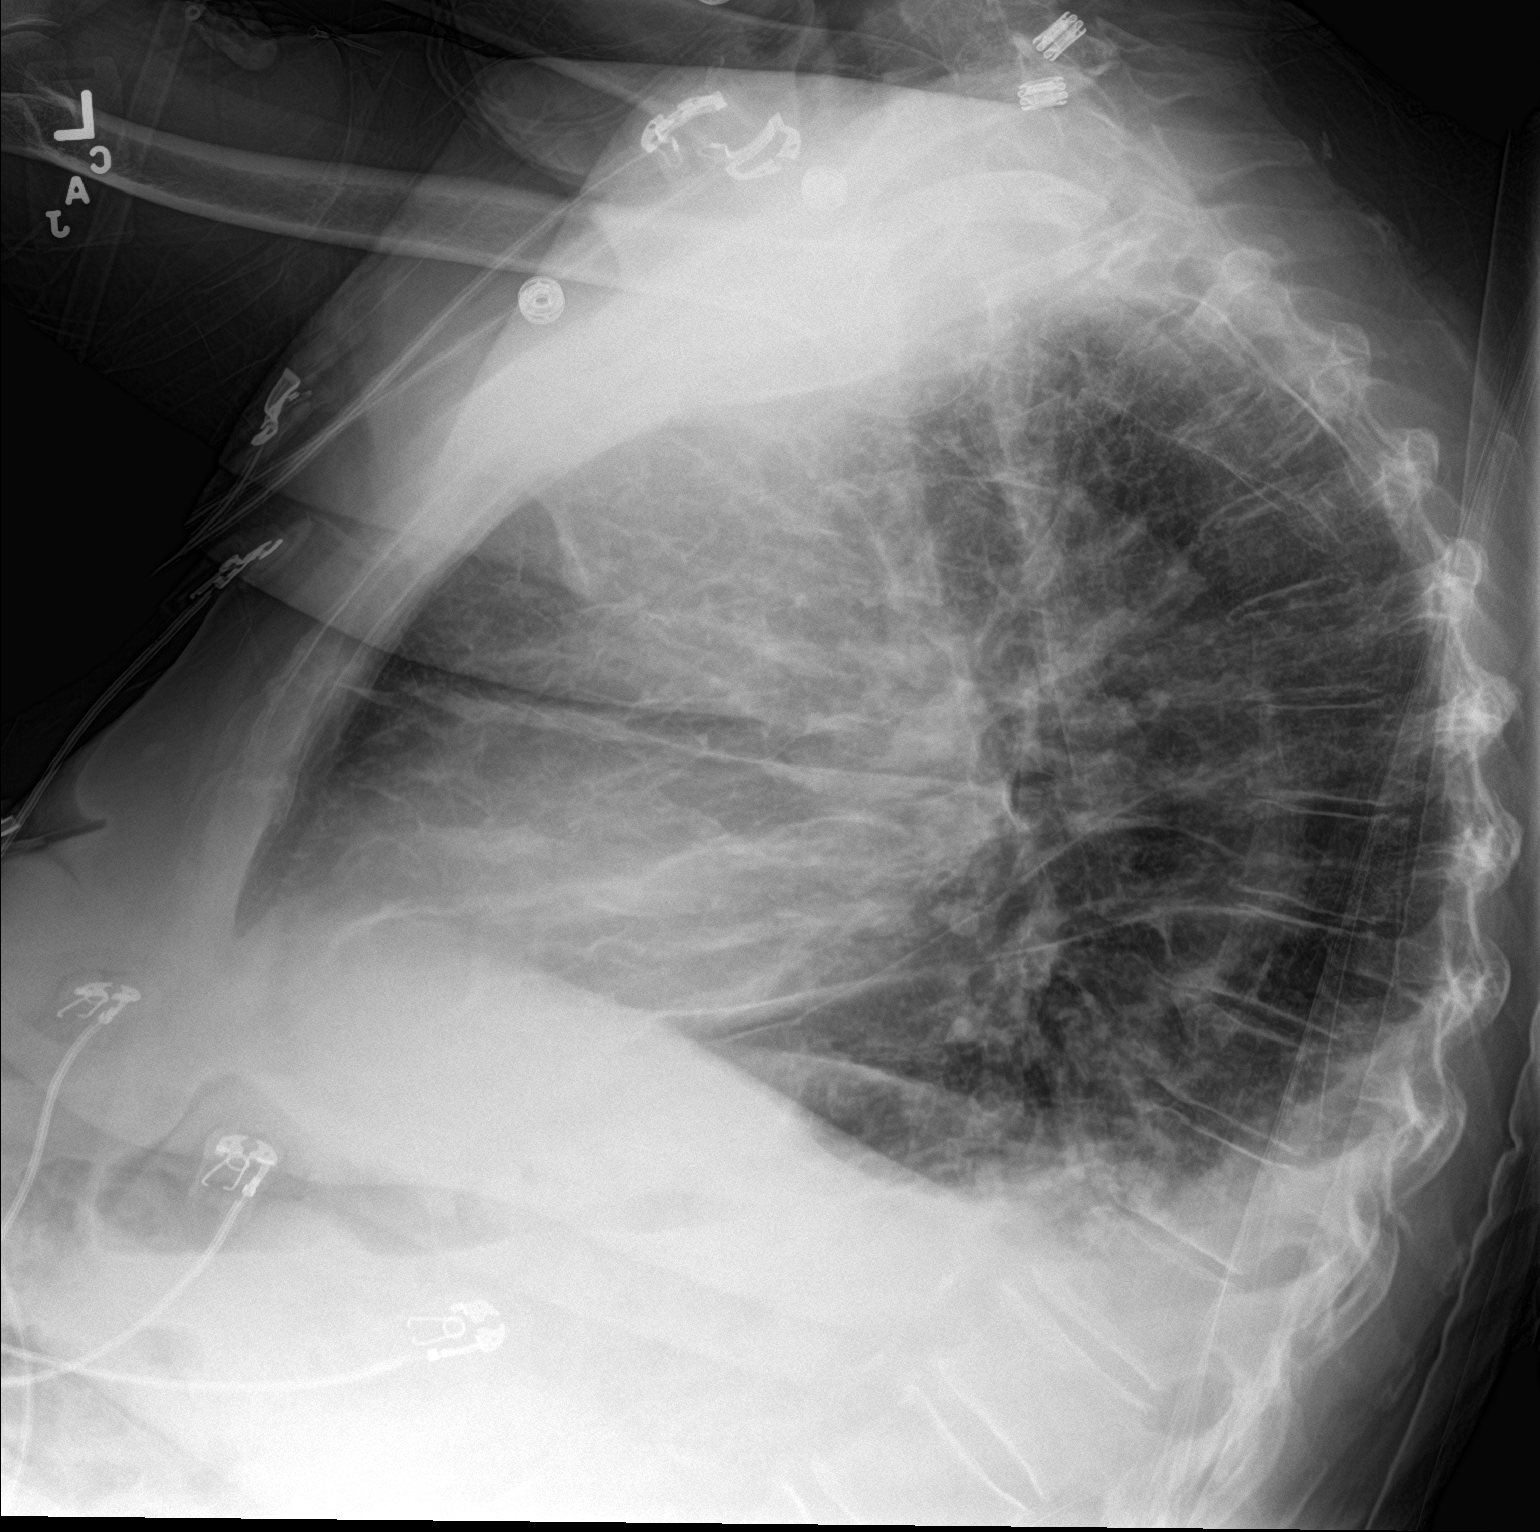

[chest ap]
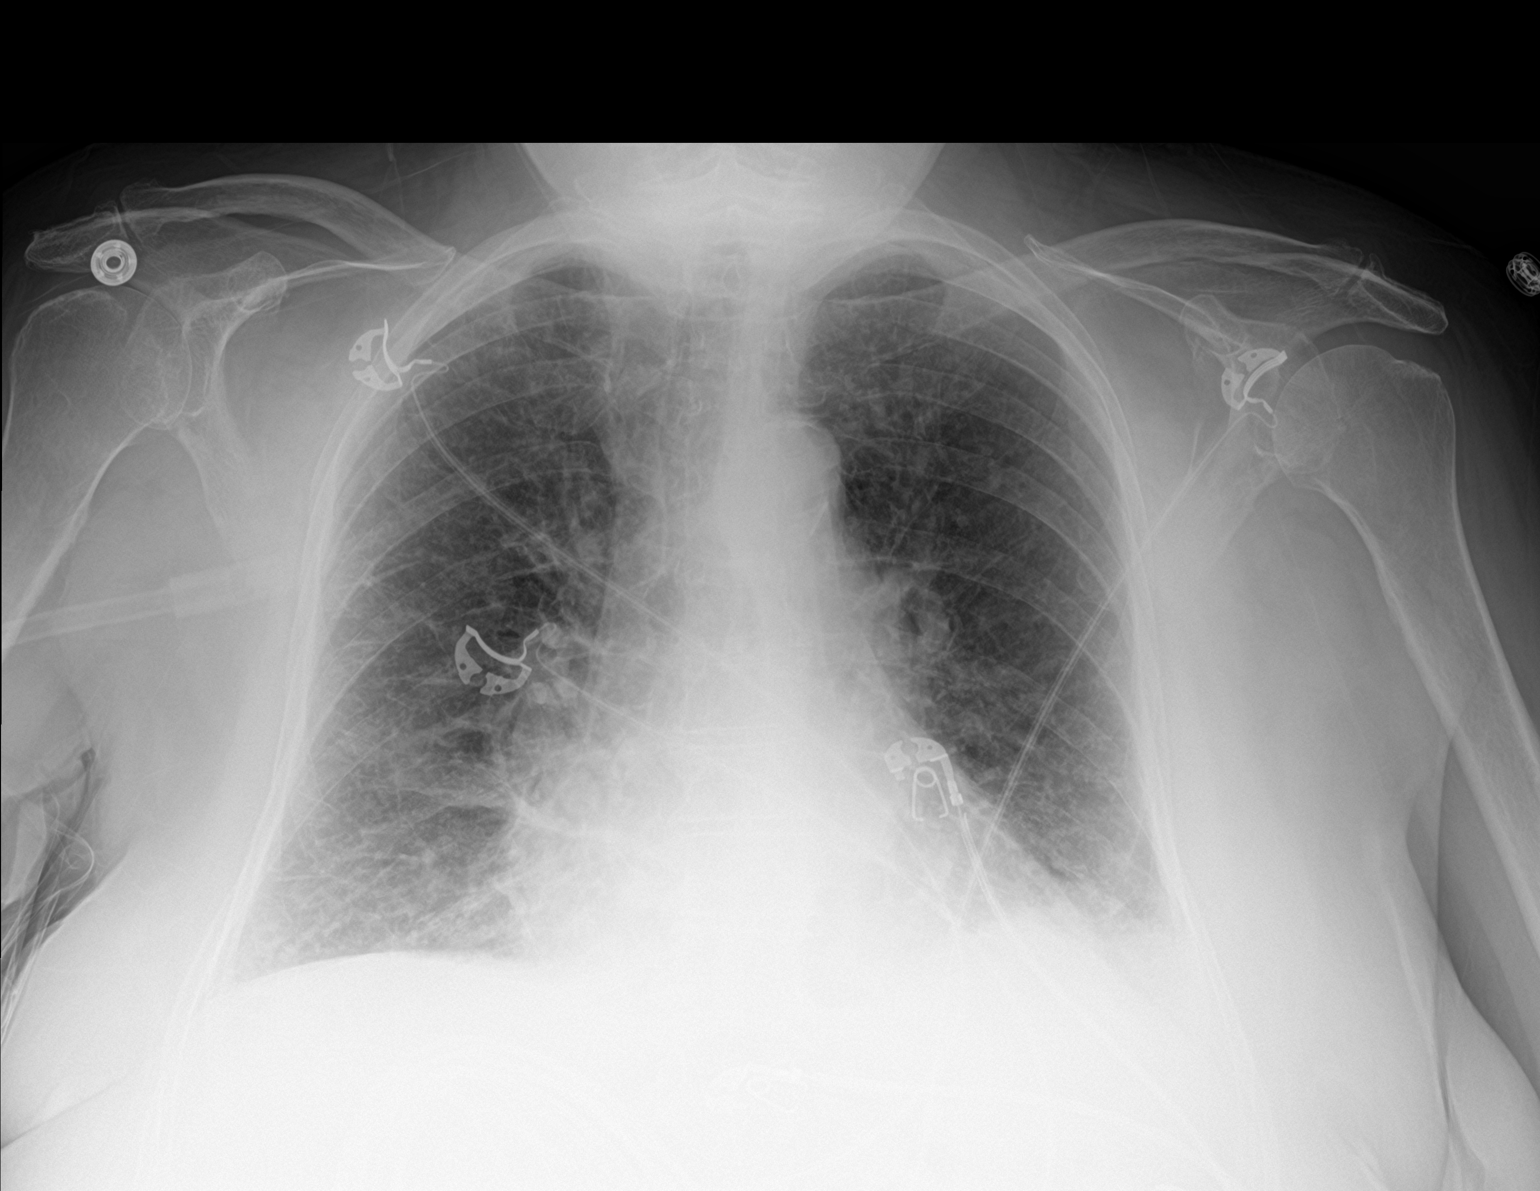

[2 of 2 positions shown; findings below may reference images not displayed]

FINDINGS: New densities at the lung bases, particularly the left lung base.
Findings are compatible with a small amount of pleural fluid and
atelectasis. Prominent interstitial lung markings with some
peripheral linear densities. Findings may represent interstitial
edema. Heart size is within normal limits and stable.
Atherosclerotic calcifications at the aortic arch. No acute bone
abnormality. Stable linear density in the medial right lower chest
is compatible with scarring.
IMPRESSION: Small pleural effusions with basilar atelectasis and suspect mild
interstitial pulmonary edema.

## 2019-08-24 ENCOUNTER — Other Ambulatory Visit: Payer: Self-pay | Admitting: Family Medicine

## 2019-08-24 NOTE — Telephone Encounter (Signed)
Pt needs appointment for further refills 

## 2019-08-26 ENCOUNTER — Ambulatory Visit (INDEPENDENT_AMBULATORY_CARE_PROVIDER_SITE_OTHER): Payer: Medicare Other | Admitting: Pharmacist Clinician (PhC)/ Clinical Pharmacy Specialist

## 2019-08-26 DIAGNOSIS — I482 Chronic atrial fibrillation, unspecified: Secondary | ICD-10-CM

## 2019-08-26 DIAGNOSIS — Z7901 Long term (current) use of anticoagulants: Secondary | ICD-10-CM

## 2019-08-26 LAB — POCT INR: INR: 2.3 (ref 2.0–3.0)

## 2019-09-03 ENCOUNTER — Ambulatory Visit (INDEPENDENT_AMBULATORY_CARE_PROVIDER_SITE_OTHER): Payer: Medicare Other | Admitting: Cardiology

## 2019-09-03 DIAGNOSIS — Z7901 Long term (current) use of anticoagulants: Secondary | ICD-10-CM

## 2019-09-03 DIAGNOSIS — I482 Chronic atrial fibrillation, unspecified: Secondary | ICD-10-CM | POA: Diagnosis not present

## 2019-09-03 LAB — POCT INR: INR: 3.8 — AB (ref 2.0–3.0)

## 2019-09-10 ENCOUNTER — Ambulatory Visit (INDEPENDENT_AMBULATORY_CARE_PROVIDER_SITE_OTHER): Payer: Medicare Other | Admitting: Pharmacist

## 2019-09-10 DIAGNOSIS — Z7901 Long term (current) use of anticoagulants: Secondary | ICD-10-CM

## 2019-09-10 DIAGNOSIS — I482 Chronic atrial fibrillation, unspecified: Secondary | ICD-10-CM | POA: Diagnosis not present

## 2019-09-10 LAB — POCT INR: INR: 2.7 (ref 2.0–3.0)

## 2019-09-15 ENCOUNTER — Other Ambulatory Visit: Payer: Self-pay | Admitting: Cardiovascular Disease

## 2019-09-15 ENCOUNTER — Other Ambulatory Visit: Payer: Self-pay | Admitting: Family Medicine

## 2019-09-17 LAB — POCT INR: INR: 3.8 — AB (ref 2.0–3.0)

## 2019-09-18 ENCOUNTER — Ambulatory Visit (INDEPENDENT_AMBULATORY_CARE_PROVIDER_SITE_OTHER): Payer: Medicare Other | Admitting: Pharmacist

## 2019-09-18 DIAGNOSIS — Z7901 Long term (current) use of anticoagulants: Secondary | ICD-10-CM | POA: Diagnosis not present

## 2019-09-18 DIAGNOSIS — I482 Chronic atrial fibrillation, unspecified: Secondary | ICD-10-CM

## 2019-09-19 ENCOUNTER — Other Ambulatory Visit: Payer: Self-pay | Admitting: Family Medicine

## 2019-09-21 NOTE — Telephone Encounter (Signed)
Pt needs appointment for further refills

## 2019-09-23 ENCOUNTER — Ambulatory Visit (INDEPENDENT_AMBULATORY_CARE_PROVIDER_SITE_OTHER): Payer: Medicare Other | Admitting: Cardiovascular Disease

## 2019-09-23 DIAGNOSIS — I482 Chronic atrial fibrillation, unspecified: Secondary | ICD-10-CM | POA: Diagnosis not present

## 2019-09-23 DIAGNOSIS — Z7901 Long term (current) use of anticoagulants: Secondary | ICD-10-CM | POA: Diagnosis not present

## 2019-09-23 LAB — POCT INR: INR: 2.2 (ref 2.0–3.0)

## 2019-09-30 ENCOUNTER — Other Ambulatory Visit: Payer: Self-pay | Admitting: Family Medicine

## 2019-09-30 LAB — POCT INR: INR: 2.1 (ref 2.0–3.0)

## 2019-10-01 ENCOUNTER — Ambulatory Visit (INDEPENDENT_AMBULATORY_CARE_PROVIDER_SITE_OTHER): Payer: Medicare Other | Admitting: Cardiovascular Disease

## 2019-10-01 ENCOUNTER — Other Ambulatory Visit: Payer: Self-pay

## 2019-10-01 DIAGNOSIS — Z7901 Long term (current) use of anticoagulants: Secondary | ICD-10-CM

## 2019-10-01 DIAGNOSIS — I482 Chronic atrial fibrillation, unspecified: Secondary | ICD-10-CM | POA: Diagnosis not present

## 2019-10-05 ENCOUNTER — Other Ambulatory Visit: Payer: Self-pay

## 2019-10-05 MED ORDER — SPIRONOLACTONE 25 MG PO TABS: 25.0000 mg | ORAL_TABLET | Freq: Every day | ORAL | 0 refills | Status: DC

## 2019-10-07 ENCOUNTER — Encounter (INDEPENDENT_AMBULATORY_CARE_PROVIDER_SITE_OTHER): Payer: Medicare Other | Admitting: Ophthalmology

## 2019-10-09 LAB — POCT INR: INR: 3.4 — AB (ref 2.0–3.0)

## 2019-10-12 ENCOUNTER — Ambulatory Visit (INDEPENDENT_AMBULATORY_CARE_PROVIDER_SITE_OTHER): Payer: Medicare Other | Admitting: Cardiology

## 2019-10-12 DIAGNOSIS — I482 Chronic atrial fibrillation, unspecified: Secondary | ICD-10-CM

## 2019-10-12 DIAGNOSIS — Z7901 Long term (current) use of anticoagulants: Secondary | ICD-10-CM

## 2019-10-13 ENCOUNTER — Telehealth: Payer: Self-pay | Admitting: Pharmacist

## 2019-10-13 NOTE — Telephone Encounter (Signed)
Please see anti-coagulation note

## 2019-10-13 NOTE — Telephone Encounter (Signed)
Patient returning call.

## 2019-10-14 LAB — POCT INR: INR: 2.2 (ref 2.0–3.0)

## 2019-10-15 ENCOUNTER — Ambulatory Visit: Payer: Self-pay | Admitting: Cardiology

## 2019-10-15 ENCOUNTER — Ambulatory Visit: Payer: Medicare Other | Admitting: Sports Medicine

## 2019-10-15 DIAGNOSIS — I482 Chronic atrial fibrillation, unspecified: Secondary | ICD-10-CM

## 2019-10-15 DIAGNOSIS — Z7901 Long term (current) use of anticoagulants: Secondary | ICD-10-CM

## 2019-10-22 ENCOUNTER — Encounter: Payer: Self-pay | Admitting: Cardiovascular Disease

## 2019-10-22 DIAGNOSIS — E1142 Type 2 diabetes mellitus with diabetic polyneuropathy: Secondary | ICD-10-CM | POA: Diagnosis not present

## 2019-10-22 DIAGNOSIS — I110 Hypertensive heart disease with heart failure: Secondary | ICD-10-CM | POA: Diagnosis not present

## 2019-10-22 DIAGNOSIS — E1165 Type 2 diabetes mellitus with hyperglycemia: Secondary | ICD-10-CM | POA: Diagnosis not present

## 2019-10-22 DIAGNOSIS — E559 Vitamin D deficiency, unspecified: Secondary | ICD-10-CM | POA: Diagnosis not present

## 2019-10-22 DIAGNOSIS — Z139 Encounter for screening, unspecified: Secondary | ICD-10-CM | POA: Diagnosis not present

## 2019-10-22 DIAGNOSIS — E785 Hyperlipidemia, unspecified: Secondary | ICD-10-CM | POA: Diagnosis not present

## 2019-10-22 DIAGNOSIS — Z79899 Other long term (current) drug therapy: Secondary | ICD-10-CM | POA: Diagnosis not present

## 2019-10-22 DIAGNOSIS — M109 Gout, unspecified: Secondary | ICD-10-CM | POA: Diagnosis not present

## 2019-10-22 DIAGNOSIS — Z7901 Long term (current) use of anticoagulants: Secondary | ICD-10-CM | POA: Diagnosis not present

## 2019-10-22 LAB — POCT INR: INR: 2 (ref 2.0–3.0)

## 2019-10-23 ENCOUNTER — Ambulatory Visit (INDEPENDENT_AMBULATORY_CARE_PROVIDER_SITE_OTHER): Payer: Medicare Other | Admitting: Pharmacist Clinician (PhC)/ Clinical Pharmacy Specialist

## 2019-10-23 ENCOUNTER — Encounter: Payer: Self-pay | Admitting: Pharmacist Clinician (PhC)/ Clinical Pharmacy Specialist

## 2019-10-23 DIAGNOSIS — Z7901 Long term (current) use of anticoagulants: Secondary | ICD-10-CM

## 2019-10-23 DIAGNOSIS — I482 Chronic atrial fibrillation, unspecified: Secondary | ICD-10-CM

## 2019-10-30 DIAGNOSIS — Z7901 Long term (current) use of anticoagulants: Secondary | ICD-10-CM | POA: Diagnosis not present

## 2019-10-30 LAB — POCT INR: INR: 2.4 (ref 2.0–3.0)

## 2019-11-02 ENCOUNTER — Ambulatory Visit (INDEPENDENT_AMBULATORY_CARE_PROVIDER_SITE_OTHER): Payer: Medicare Other | Admitting: Cardiology

## 2019-11-02 DIAGNOSIS — I482 Chronic atrial fibrillation, unspecified: Secondary | ICD-10-CM

## 2019-11-02 DIAGNOSIS — Z7901 Long term (current) use of anticoagulants: Secondary | ICD-10-CM

## 2019-11-03 ENCOUNTER — Encounter (INDEPENDENT_AMBULATORY_CARE_PROVIDER_SITE_OTHER): Payer: Medicare Other | Admitting: Ophthalmology

## 2019-11-06 LAB — POCT INR: INR: 2.1 (ref 2.0–3.0)

## 2019-11-09 ENCOUNTER — Ambulatory Visit (INDEPENDENT_AMBULATORY_CARE_PROVIDER_SITE_OTHER): Payer: Medicare Other | Admitting: Pharmacist

## 2019-11-09 DIAGNOSIS — Z7901 Long term (current) use of anticoagulants: Secondary | ICD-10-CM | POA: Diagnosis not present

## 2019-11-09 DIAGNOSIS — I482 Chronic atrial fibrillation, unspecified: Secondary | ICD-10-CM

## 2019-11-12 ENCOUNTER — Ambulatory Visit: Payer: Medicare Other | Admitting: Sports Medicine

## 2019-11-13 ENCOUNTER — Ambulatory Visit (INDEPENDENT_AMBULATORY_CARE_PROVIDER_SITE_OTHER): Payer: Medicare Other | Admitting: Pharmacist Clinician (PhC)/ Clinical Pharmacy Specialist

## 2019-11-13 DIAGNOSIS — I482 Chronic atrial fibrillation, unspecified: Secondary | ICD-10-CM | POA: Diagnosis not present

## 2019-11-13 DIAGNOSIS — Z7901 Long term (current) use of anticoagulants: Secondary | ICD-10-CM

## 2019-11-13 LAB — POCT INR: INR: 1.8 — AB (ref 2.0–3.0)

## 2019-11-17 ENCOUNTER — Ambulatory Visit: Payer: Medicare Other | Admitting: Cardiovascular Disease

## 2019-11-18 ENCOUNTER — Other Ambulatory Visit: Payer: Self-pay | Admitting: Family Medicine

## 2019-11-19 ENCOUNTER — Other Ambulatory Visit: Payer: Self-pay | Admitting: Family Medicine

## 2019-11-20 ENCOUNTER — Ambulatory Visit (INDEPENDENT_AMBULATORY_CARE_PROVIDER_SITE_OTHER): Payer: Medicare Other | Admitting: Pharmacist

## 2019-11-20 DIAGNOSIS — Z7901 Long term (current) use of anticoagulants: Secondary | ICD-10-CM | POA: Diagnosis not present

## 2019-11-20 DIAGNOSIS — I482 Chronic atrial fibrillation, unspecified: Secondary | ICD-10-CM

## 2019-11-20 LAB — POCT INR: INR: 2.1 (ref 2.0–3.0)

## 2019-11-28 DIAGNOSIS — Z7901 Long term (current) use of anticoagulants: Secondary | ICD-10-CM | POA: Diagnosis not present

## 2019-11-28 LAB — POCT INR: INR: 2.6 (ref 2.0–3.0)

## 2019-11-30 ENCOUNTER — Ambulatory Visit (INDEPENDENT_AMBULATORY_CARE_PROVIDER_SITE_OTHER): Payer: Medicare Other | Admitting: Cardiovascular Disease

## 2019-11-30 DIAGNOSIS — Z7901 Long term (current) use of anticoagulants: Secondary | ICD-10-CM

## 2019-11-30 DIAGNOSIS — I482 Chronic atrial fibrillation, unspecified: Secondary | ICD-10-CM

## 2019-12-02 ENCOUNTER — Encounter (INDEPENDENT_AMBULATORY_CARE_PROVIDER_SITE_OTHER): Payer: Medicare Other | Admitting: Ophthalmology

## 2019-12-02 ENCOUNTER — Other Ambulatory Visit: Payer: Self-pay

## 2019-12-02 DIAGNOSIS — H43813 Vitreous degeneration, bilateral: Secondary | ICD-10-CM | POA: Diagnosis not present

## 2019-12-02 DIAGNOSIS — I10 Essential (primary) hypertension: Secondary | ICD-10-CM

## 2019-12-02 DIAGNOSIS — H348322 Tributary (branch) retinal vein occlusion, left eye, stable: Secondary | ICD-10-CM

## 2019-12-02 DIAGNOSIS — H35033 Hypertensive retinopathy, bilateral: Secondary | ICD-10-CM

## 2019-12-02 DIAGNOSIS — H35373 Puckering of macula, bilateral: Secondary | ICD-10-CM

## 2019-12-02 DIAGNOSIS — H2511 Age-related nuclear cataract, right eye: Secondary | ICD-10-CM

## 2019-12-03 ENCOUNTER — Telehealth: Payer: Self-pay | Admitting: Sports Medicine

## 2019-12-03 NOTE — Telephone Encounter (Signed)
Left message for pt to call to schedule an appt to pick up diabetic shoes and inserts on 3.15.21 in [place] office... auth expires 3.30.2021.Marland KitchenMarland Kitchen

## 2019-12-07 ENCOUNTER — Ambulatory Visit (INDEPENDENT_AMBULATORY_CARE_PROVIDER_SITE_OTHER): Payer: Medicare Other | Admitting: Cardiology

## 2019-12-07 DIAGNOSIS — I482 Chronic atrial fibrillation, unspecified: Secondary | ICD-10-CM

## 2019-12-07 DIAGNOSIS — Z7901 Long term (current) use of anticoagulants: Secondary | ICD-10-CM

## 2019-12-07 LAB — POCT INR: INR: 2.6 (ref 2.0–3.0)

## 2019-12-17 ENCOUNTER — Ambulatory Visit (INDEPENDENT_AMBULATORY_CARE_PROVIDER_SITE_OTHER): Payer: Medicare Other | Admitting: Internal Medicine

## 2019-12-17 ENCOUNTER — Ambulatory Visit: Payer: Medicare Other | Admitting: Sports Medicine

## 2019-12-17 DIAGNOSIS — Z7901 Long term (current) use of anticoagulants: Secondary | ICD-10-CM | POA: Diagnosis not present

## 2019-12-17 DIAGNOSIS — I482 Chronic atrial fibrillation, unspecified: Secondary | ICD-10-CM

## 2019-12-17 LAB — POCT INR: INR: 2.9 (ref 2.0–3.0)

## 2019-12-22 ENCOUNTER — Other Ambulatory Visit: Payer: Self-pay

## 2019-12-22 ENCOUNTER — Encounter: Payer: Self-pay | Admitting: Cardiovascular Disease

## 2019-12-22 ENCOUNTER — Ambulatory Visit (INDEPENDENT_AMBULATORY_CARE_PROVIDER_SITE_OTHER): Payer: Medicare Other | Admitting: Cardiovascular Disease

## 2019-12-22 VITALS — BP 122/84 | HR 90 | Temp 97.2°F | Ht 64.0 in | Wt 197.0 lb

## 2019-12-22 DIAGNOSIS — I2510 Atherosclerotic heart disease of native coronary artery without angina pectoris: Secondary | ICD-10-CM | POA: Diagnosis not present

## 2019-12-22 DIAGNOSIS — I5032 Chronic diastolic (congestive) heart failure: Secondary | ICD-10-CM

## 2019-12-22 DIAGNOSIS — I10 Essential (primary) hypertension: Secondary | ICD-10-CM | POA: Diagnosis not present

## 2019-12-22 DIAGNOSIS — I872 Venous insufficiency (chronic) (peripheral): Secondary | ICD-10-CM | POA: Diagnosis not present

## 2019-12-22 DIAGNOSIS — I482 Chronic atrial fibrillation, unspecified: Secondary | ICD-10-CM | POA: Diagnosis not present

## 2019-12-22 DIAGNOSIS — E785 Hyperlipidemia, unspecified: Secondary | ICD-10-CM

## 2019-12-22 NOTE — Progress Notes (Signed)
Cardiology Office Note   Date:  12/24/2019   ID:  Rebekah Stewart [place], [place] 29-Dec-1934, MRN [id number]  PCP:  Nicoletta Dress, MD  Cardiologist:   Kathlyn Sacramento, MD   No chief complaint on file.     History of Present Illness: Rebekah Stewart is a 85 y.o. female who presents for  a followup visit regarding chronic atrial fibrillation, refractory hypertension and chronic diastolic heart failure. She had a cardiac catheterization done in 2010 which showed mild to moderate three-vessel nonobstructive coronary artery disease. Worst stenosis was in the mid LAD which was estimated to be 50%. She has multiple chronic medical conditions including Diabetes and hyperlipidemia. She is known to have chronic bilateral leg edema  with stasis dermatitis.  She has secondary lymphedema due to chronic venous insufficiency. Lower extremity arterial Doppler in July showed normal ABI bilaterally. The patient could not afford Eliquis due to cost and she is back on warfarin.    She had previous venous ulceration of the lower extremities.  She was treated with a lymphedema pump with subsequent improvement.  Blood pressure improved significantly with addition of spironolactone.  She has been doing extremely well with no recent chest pain, shortness of breath or leg edema.  She takes her medications regularly.   Past Medical History:  Diagnosis Date  . Arthritis of back    lumbar  . Atrial fibrillation ([place]) 2010  . Atrial fibrillation ([place])   . Atrial fibrillation ([place])   . Chronic diastolic heart failure ([place])   . Colonic polyp   . Compression fracture of thoracic vertebra (HCC)   . Congestive heart failure, unspecified   . Coronary artery disease    mild non obstructive disease per cardiac cath in 2010  . Degeneration of lumbar or lumbosacral intervertebral disc   . Diverticulosis   . DM2 (diabetes mellitus, type 2) ([place])   . Hemorrhoids   . HLD (hyperlipidemia)   . HTN (hypertension)    diagnosed when she was in her 55s. Refractory. No RAS by [place] in 2010  . Long-term (current) use of anticoagulants   . Lumbosacral spondylosis without myelopathy   . Osteoarthrosis, unspecified whether generalized or localized, unspecified site   . Pain in thoracic spine   . Swelling of limb   . UTI (lower urinary tract infection)   . Varicose veins of lower extremities with other complications   . Vitamin D deficiency     Past Surgical History:  Procedure Laterality Date  . ADENOIDECTOMY    . CARDIAC CATHETERIZATION  2010   At [facility]. Mild nonobstructive CAD  . CORONARY ANGIOPLASTY    . EYE SURGERY    . TONSILLECTOMY       Current Outpatient Medications  Medication Sig Dispense Refill  . allopurinol (ZYLOPRIM) 300 MG tablet Take 1 tablet (300 mg total) by mouth daily as needed (for gout). 30 tablet 1  . atorvastatin (LIPITOR) 40 MG tablet TAKE 1 TABLET BY MOUTH EVERY DAY 30 tablet 1  . benazepril (LOTENSIN) 20 MG tablet Take 1 tablet (20 mg total) by mouth daily. 30 tablet 1  . carvedilol (COREG) 25 MG tablet TAKE 1 TABLET BY MOUTH TWICE A DAY WITH MEALS 180 tablet 0  . Cholecalciferol (VITAMIN D) 2000 UNITS CAPS Take 1 capsule by mouth daily.    . fish oil-omega-3 fatty acids 1000 MG capsule Take 2 g by mouth 2 (two) times daily.     . furosemide (LASIX) 20 MG  tablet TAKE 1 TABLET BY MOUTH EVERY DAY ALTERNATING 20MG & 40MG (Patient taking differently: Take 20 mg by mouth daily. ) 45 tablet 0  . glucose blood (ACCU-CHEK AVIVA PLUS) test strip Check two times daily. Dx code E11.65 100 each 0  . isosorbide mononitrate (IMDUR) 60 MG 24 hr tablet Take 1 tablet (60 mg total) by mouth daily. 30 tablet 1  . Lancets (ONETOUCH DELICA PLUS [id number]) MISC 1 each by Does not apply route 2 (two) times daily before a meal. Use to check blood sugars twilce daily before meal 100 each 0  . metFORMIN (GLUCOPHAGE) 500 MG tablet Take 1 tablet (500 mg total) by mouth 3 (three) times daily with  meals. 90 tablet 1  . spironolactone (ALDACTONE) 25 MG tablet Take 1 tablet (25 mg total) by mouth daily. 90 tablet 0  . warfarin (COUMADIN) 4 MG tablet Take 1 tablet (4 mg total) by mouth one time only at 6 PM. Use as directed. 30 tablet 1   No current facility-administered medications for this visit.    Allergies:   Bee venom, Iodinated diagnostic agents, Penicillins, and Tape    Social History:  The patient  reports that she has never smoked. She has never used smokeless tobacco. She reports that she does not drink alcohol or use drugs.   Family History:  The patient's family history includes Cancer in her sister; Heart attack in her father; Heart disease in her brother, father, mother, and another family member; Stroke in her mother.    ROS:  Please see the history of present illness.   Otherwise, review of systems are positive for none.   All other systems are reviewed and negative.    PHYSICAL EXAM: VS:  BP 122/84   Pulse 90   Temp (!) 97.2 F (36.2 C)   Ht _0  (1.626 m)   Wt 197 lb (89.4 kg)   SpO2 98%   BMI 33.81 kg/m  , BMI Body mass index is 33.81 kg/m. GEN: Well nourished, well developed, in no acute distress  HEENT: normal  Neck: no JVD, carotid bruits, or masses Cardiac: Irregularly irregular ; no murmurs, rubs, or gallops,  Respiratory:  clear to auscultation bilaterally, normal work of breathing GI: soft, nontender, nondistended, + BS MS: no deformity or atrophy  Skin: warm and dry, no rash Neuro:  Strength and sensation are intact Psych: euthymic mood, full affect Dorsalis pedis is palpable bilaterally.  Trace bilateral leg edema  EKG:  EKG is not ordered today.    Recent Labs: 03/27/2019: ALT 17; BNP 268.8 07/14/2019: BUN 40; Creatinine, Ser 1.22; Hemoglobin 14.0; Platelets 257; Potassium 5.3; Sodium 142    Lipid Panel    Component Value Date/Time   CHOL 129 03/27/2019 0000   TRIG 79 03/27/2019 0000   HDL 62 03/27/2019 0000   CHOLHDL 2.1  03/27/2019 0000   LDLCALC 51 03/27/2019 0000      Wt Readings from Last 3 Encounters:  12/22/19 197 lb (89.4 kg)  07/14/19 184 lb 3.2 oz (83.6 kg)  03/31/19 191 lb (86.6 kg)       ASSESSMENT AND PLAN:  1.  Chronic atrial fibrillation:  Ventricular rate is reasonably controlled on carvedilol.  She is on long-term anticoagulation with warfarin.   2. Essential hypertension: Blood pressure is well controlled.  3. Coronary artery disease involving native coronary arteries with atypical chest pain: Stress test last year was normal  4. Hyperlipidemia: Continue treatment with atorvastatin.  Most recent LDL  was 58.  5. Chronic diastolic heart failure: She appears to be euvolemic on current medications.  6.  Chronic venous insufficiency with secondary lymphedema: Improved significantly compared to before.   Disposition:   FU with me in 6 months  Signed,  Kathlyn Sacramento, MD  12/24/2019 4:49 PM    [place]

## 2019-12-22 NOTE — Patient Instructions (Signed)
Medication Instructions:  No changes *If you need a refill on your cardiac medications before your next appointment, please call your pharmacy*   Lab Work: None ordered If you have labs (blood work) drawn today and your tests are completely normal, you will receive your results only by: Marland Kitchen MyChart Message (if you have MyChart) OR . A paper copy in the mail If you have any lab test that is abnormal or we need to change your treatment, we will call you to review the results.   Testing/Procedures: None ordered   Follow-Up: At [facility], you and your health needs are our priority.  As part of our continuing mission to provide you with exceptional heart care, we have created designated Provider Care Teams.  These Care Teams include your primary Cardiologist (physician) and Advanced Practice Providers (APPs -  Physician Assistants and Nurse Practitioners) who all work together to provide you with the care you need, when you need it.  We recommend signing up for the patient portal called "MyChart".  Sign up information is provided on this After Visit Summary.  MyChart is used to connect with patients for Virtual Visits (Telemedicine).  Patients are able to view lab/test results, encounter notes, upcoming appointments, etc.  Non-urgent messages can be sent to your provider as well.   To learn more about what you can do with MyChart, go to [email or web address].    Your next appointment:   6 month(s)  The format for your next appointment:   In Person  Provider:   Kathlyn Sacramento, MD

## 2019-12-24 ENCOUNTER — Encounter: Payer: Self-pay | Admitting: Sports Medicine

## 2019-12-24 ENCOUNTER — Ambulatory Visit (INDEPENDENT_AMBULATORY_CARE_PROVIDER_SITE_OTHER): Payer: Medicare Other | Admitting: Sports Medicine

## 2019-12-24 ENCOUNTER — Other Ambulatory Visit: Payer: Self-pay

## 2019-12-24 DIAGNOSIS — M79674 Pain in right toe(s): Secondary | ICD-10-CM

## 2019-12-24 DIAGNOSIS — M2042 Other hammer toe(s) (acquired), left foot: Secondary | ICD-10-CM

## 2019-12-24 DIAGNOSIS — M2010 Hallux valgus (acquired), unspecified foot: Secondary | ICD-10-CM

## 2019-12-24 DIAGNOSIS — M79675 Pain in left toe(s): Secondary | ICD-10-CM | POA: Diagnosis not present

## 2019-12-24 DIAGNOSIS — M2140 Flat foot [pes planus] (acquired), unspecified foot: Secondary | ICD-10-CM

## 2019-12-24 DIAGNOSIS — M2012 Hallux valgus (acquired), left foot: Secondary | ICD-10-CM | POA: Diagnosis not present

## 2019-12-24 DIAGNOSIS — M2141 Flat foot [pes planus] (acquired), right foot: Secondary | ICD-10-CM | POA: Diagnosis not present

## 2019-12-24 DIAGNOSIS — E1140 Type 2 diabetes mellitus with diabetic neuropathy, unspecified: Secondary | ICD-10-CM | POA: Diagnosis not present

## 2019-12-24 DIAGNOSIS — B351 Tinea unguium: Secondary | ICD-10-CM | POA: Diagnosis not present

## 2019-12-24 DIAGNOSIS — I739 Peripheral vascular disease, unspecified: Secondary | ICD-10-CM

## 2019-12-24 DIAGNOSIS — L814 Other melanin hyperpigmentation: Secondary | ICD-10-CM | POA: Diagnosis not present

## 2019-12-24 DIAGNOSIS — M2041 Other hammer toe(s) (acquired), right foot: Secondary | ICD-10-CM | POA: Diagnosis not present

## 2019-12-24 DIAGNOSIS — L821 Other seborrheic keratosis: Secondary | ICD-10-CM | POA: Diagnosis not present

## 2019-12-24 NOTE — Progress Notes (Signed)
Subjective: Rebekah Stewart is a 85 y.o. female patient with history of diabetes who returns to office today complaining of long, painful nails while ambulating in shoes; unable to trim.  Patient is also here for pickup of diabetic shoes.  Patient states that the glucose reading yesterday was 116 last A1c recorded at 6.6 and she saw her primary care Dr. [name] 2 months ago. Patient denies any other pedal complaints.   Patient Active Problem List   Diagnosis Date Noted  . OSA (obstructive sleep apnea) 07/07/2019  . Respiratory failure with hypoxia ([place]) 10/10/2018  . Chronic anticoagulation 08/19/2018  . Non-insulin dependent type 2 diabetes mellitus ([place]) 08/13/2018  . Intertrigo 08/10/2018  . Pulmonary hypertension assoc with unclear multi-factorial mechanisms ([place])   . Acute on chronic diastolic heart failure ([place])   . Flash pulmonary edema ([place]) 08/09/2018  . Varicose veins of lower extremities with complications, bilateral 10/30/2016  . Dyslipidemia 05/21/2014  . Bilateral leg pain 11/26/2012  . Chronic diastolic heart failure ([place])   . Chronic atrial fibrillation ([place])   . Essential hypertension   . Coronary artery disease    Current Outpatient Medications on File Prior to Visit  Medication Sig Dispense Refill  . allopurinol (ZYLOPRIM) 300 MG tablet Take 1 tablet (300 mg total) by mouth daily as needed (for gout). 30 tablet 1  . atorvastatin (LIPITOR) 40 MG tablet TAKE 1 TABLET BY MOUTH EVERY DAY 30 tablet 1  . benazepril (LOTENSIN) 20 MG tablet Take 1 tablet (20 mg total) by mouth daily. 30 tablet 1  . carvedilol (COREG) 25 MG tablet TAKE 1 TABLET BY MOUTH TWICE A DAY WITH MEALS 180 tablet 0  . Cholecalciferol (VITAMIN D) 2000 UNITS CAPS Take 1 capsule by mouth daily.    . fish oil-omega-3 fatty acids 1000 MG capsule Take 2 g by mouth 2 (two) times daily.     . furosemide (LASIX) 20 MG tablet TAKE 1 TABLET BY MOUTH EVERY DAY ALTERNATING 20MG & 40MG (Patient taking differently:  Take 20 mg by mouth daily. ) 45 tablet 0  . glucose blood (ACCU-CHEK AVIVA PLUS) test strip Check two times daily. Dx code E11.65 100 each 0  . isosorbide mononitrate (IMDUR) 60 MG 24 hr tablet Take 1 tablet (60 mg total) by mouth daily. 30 tablet 1  . Lancets (ONETOUCH DELICA PLUS [id number]) MISC 1 each by Does not apply route 2 (two) times daily before a meal. Use to check blood sugars twilce daily before meal 100 each 0  . metFORMIN (GLUCOPHAGE) 500 MG tablet Take 1 tablet (500 mg total) by mouth 3 (three) times daily with meals. 90 tablet 1  . spironolactone (ALDACTONE) 25 MG tablet Take 1 tablet (25 mg total) by mouth daily. 90 tablet 0  . warfarin (COUMADIN) 4 MG tablet Take 1 tablet (4 mg total) by mouth one time only at 6 PM. Use as directed. 30 tablet 1   No current facility-administered medications on file prior to visit.   Allergies  Allergen Reactions  . Bee Venom Shortness Of Breath and Swelling  . Iodinated Diagnostic Agents Itching  . Penicillins Swelling  . Tape     Pulls the skin    Recent Results (from the past 2160 hour(s))  POCT INR     Status: None   Collection Time: 09/30/19 12:00 AM  Result Value Ref Range   INR 2.1 2.0 - 3.0  POCT INR     Status: Abnormal   Collection Time: 10/09/19 12:00  AM  Result Value Ref Range   INR 3.4 (A) 2.0 - 3.0  POCT INR     Status: None   Collection Time: 10/14/19 12:00 AM  Result Value Ref Range   INR 2.2 2.0 - 3.0  POCT INR     Status: None   Collection Time: 10/22/19 12:00 AM  Result Value Ref Range   INR 2.0 2.0 - 3.0  POCT INR     Status: None   Collection Time: 10/30/19 12:00 AM  Result Value Ref Range   INR 2.4 2.0 - 3.0  POCT INR     Status: None   Collection Time: 11/06/19 12:00 AM  Result Value Ref Range   INR 2.1 2.0 - 3.0  POCT INR     Status: Abnormal   Collection Time: 11/13/19 12:00 AM  Result Value Ref Range   INR 1.8 (A) 2.0 - 3.0  POCT INR     Status: None   Collection Time: 11/20/19 12:00 AM   Result Value Ref Range   INR 2.1 2.0 - 3.0    Comment: self-test  POCT INR     Status: None   Collection Time: 11/28/19 12:00 AM  Result Value Ref Range   INR 2.6 2.0 - 3.0  POCT INR     Status: None   Collection Time: 12/07/19 12:00 AM  Result Value Ref Range   INR 2.6 2.0 - 3.0  POCT INR     Status: None   Collection Time: 12/17/19 12:00 AM  Result Value Ref Range   INR 2.9 2.0 - 3.0    Objective: General: Patient is awake, alert, and oriented x 3 and in no acute distress.  Integument: Skin is warm, dry and supple bilateral. Nails are tender, long, thickened and dystrophic with subungual debris, consistent with onychomycosis, 1-5 bilateral.  No acute signs of infection. No open lesions or preulcerative lesions present bilateral. Remaining integument unremarkable.  Vasculature:  Dorsalis Pedis pulse 1/4 bilateral. Posterior Tibial pulse  0/4 bilateral.  Capillary fill time <5 sec 1-5 bilateral. Diminished hair growth to the level of the digits. Temperature gradient within normal limits.  Moderate varicosities and venous stasis hyperpigmentation with trace edema bilateral.   Neurology: The patient has diminished sensation measured with a 5.07/10g Semmes Weinstein Monofilament at all pedal sites bilateral . Vibratory sensation diminished bilateral with tuning fork. No Babinski sign present bilateral.   Musculoskeletal: No tenderness bilateral. Muscular strength 5/5 in all lower extremity muscular groups bilateral without pain on range of motion. No tenderness with calf compression bilateral. Asymptomatic Pes planus, bunion and hammertoes bilateral.  Assessment and Plan: Problem List Items Addressed This Visit    None    Visit Diagnoses    Pain due to onychomycosis of toenails of both feet    -  Primary   Type 2 diabetes, controlled, with neuropathy (HCC)       PVD (peripheral vascular disease) ([place])       Acquired hallux valgus, unspecified laterality       Hammer toes of both  feet       Acquired flat foot, unspecified laterality         -Examined patient. -Re-discussed and educated patient on diabetic foot care, especially with regards to the vascular, neurological and musculoskeletal systems.  -Mechanically debrided all nails 1-5 bilateral using sterile nail nipper and filed with dremel without incident  -Diabetic shoes were dispensed and wear instructions were given to patient advised patient to break in  shoes slowly if there is any problem to call office -Patient to return  in 3 months for at risk foot care  -Patient advised to call the office if any problems or questions arise in the meantime.  Landis Martins, DPM

## 2019-12-25 ENCOUNTER — Ambulatory Visit (INDEPENDENT_AMBULATORY_CARE_PROVIDER_SITE_OTHER): Payer: Medicare Other | Admitting: Pharmacist Clinician (PhC)/ Clinical Pharmacy Specialist

## 2019-12-25 DIAGNOSIS — Z7901 Long term (current) use of anticoagulants: Secondary | ICD-10-CM | POA: Diagnosis not present

## 2019-12-25 DIAGNOSIS — I482 Chronic atrial fibrillation, unspecified: Secondary | ICD-10-CM | POA: Diagnosis not present

## 2019-12-25 LAB — POCT INR: INR: 2.1 (ref 2.0–3.0)

## 2019-12-31 ENCOUNTER — Ambulatory Visit (INDEPENDENT_AMBULATORY_CARE_PROVIDER_SITE_OTHER): Payer: Medicare Other | Admitting: Cardiology

## 2019-12-31 DIAGNOSIS — Z7901 Long term (current) use of anticoagulants: Secondary | ICD-10-CM

## 2019-12-31 DIAGNOSIS — I482 Chronic atrial fibrillation, unspecified: Secondary | ICD-10-CM

## 2019-12-31 LAB — POCT INR: INR: 2.3 (ref 2.0–3.0)

## 2020-01-08 ENCOUNTER — Ambulatory Visit (INDEPENDENT_AMBULATORY_CARE_PROVIDER_SITE_OTHER): Payer: Medicare Other | Admitting: Pharmacist

## 2020-01-08 DIAGNOSIS — I482 Chronic atrial fibrillation, unspecified: Secondary | ICD-10-CM | POA: Diagnosis not present

## 2020-01-08 DIAGNOSIS — Z7901 Long term (current) use of anticoagulants: Secondary | ICD-10-CM | POA: Diagnosis not present

## 2020-01-08 LAB — POCT INR: INR: 2.7 (ref 2.0–3.0)

## 2020-01-15 ENCOUNTER — Ambulatory Visit (INDEPENDENT_AMBULATORY_CARE_PROVIDER_SITE_OTHER): Payer: Medicare Other | Admitting: Pharmacist Clinician (PhC)/ Clinical Pharmacy Specialist

## 2020-01-15 DIAGNOSIS — Z7901 Long term (current) use of anticoagulants: Secondary | ICD-10-CM

## 2020-01-15 DIAGNOSIS — I482 Chronic atrial fibrillation, unspecified: Secondary | ICD-10-CM

## 2020-01-15 LAB — POCT INR: INR: 2.1 (ref 2.0–3.0)

## 2020-01-20 DIAGNOSIS — M79675 Pain in left toe(s): Secondary | ICD-10-CM | POA: Diagnosis not present

## 2020-01-20 DIAGNOSIS — E785 Hyperlipidemia, unspecified: Secondary | ICD-10-CM | POA: Diagnosis not present

## 2020-01-20 DIAGNOSIS — Z7901 Long term (current) use of anticoagulants: Secondary | ICD-10-CM | POA: Diagnosis not present

## 2020-01-20 DIAGNOSIS — Z79899 Other long term (current) drug therapy: Secondary | ICD-10-CM | POA: Diagnosis not present

## 2020-01-20 DIAGNOSIS — E1165 Type 2 diabetes mellitus with hyperglycemia: Secondary | ICD-10-CM | POA: Diagnosis not present

## 2020-01-20 DIAGNOSIS — I110 Hypertensive heart disease with heart failure: Secondary | ICD-10-CM | POA: Diagnosis not present

## 2020-01-20 DIAGNOSIS — E1142 Type 2 diabetes mellitus with diabetic polyneuropathy: Secondary | ICD-10-CM | POA: Diagnosis not present

## 2020-01-22 ENCOUNTER — Ambulatory Visit (INDEPENDENT_AMBULATORY_CARE_PROVIDER_SITE_OTHER): Payer: Medicare Other | Admitting: Pharmacist Clinician (PhC)/ Clinical Pharmacy Specialist

## 2020-01-22 DIAGNOSIS — Z7901 Long term (current) use of anticoagulants: Secondary | ICD-10-CM | POA: Diagnosis not present

## 2020-01-22 DIAGNOSIS — I482 Chronic atrial fibrillation, unspecified: Secondary | ICD-10-CM | POA: Diagnosis not present

## 2020-01-22 LAB — POCT INR: INR: 2.4 (ref 2.0–3.0)

## 2020-01-29 ENCOUNTER — Ambulatory Visit (INDEPENDENT_AMBULATORY_CARE_PROVIDER_SITE_OTHER): Payer: Medicare Other | Admitting: Pharmacist

## 2020-01-29 DIAGNOSIS — Z7901 Long term (current) use of anticoagulants: Secondary | ICD-10-CM

## 2020-01-29 DIAGNOSIS — I482 Chronic atrial fibrillation, unspecified: Secondary | ICD-10-CM

## 2020-01-29 LAB — POCT INR: INR: 3.1 — AB (ref 2.0–3.0)

## 2020-02-05 ENCOUNTER — Ambulatory Visit (INDEPENDENT_AMBULATORY_CARE_PROVIDER_SITE_OTHER): Payer: Medicare Other | Admitting: Pharmacist

## 2020-02-05 DIAGNOSIS — Z7901 Long term (current) use of anticoagulants: Secondary | ICD-10-CM | POA: Diagnosis not present

## 2020-02-05 DIAGNOSIS — I482 Chronic atrial fibrillation, unspecified: Secondary | ICD-10-CM

## 2020-02-05 LAB — POCT INR: INR: 4.3 — AB (ref 2.0–3.0)

## 2020-02-09 ENCOUNTER — Encounter: Payer: Self-pay | Admitting: Cardiovascular Disease

## 2020-02-09 DIAGNOSIS — I890 Lymphedema, not elsewhere classified: Secondary | ICD-10-CM | POA: Diagnosis not present

## 2020-02-09 DIAGNOSIS — R0600 Dyspnea, unspecified: Secondary | ICD-10-CM | POA: Diagnosis not present

## 2020-02-09 DIAGNOSIS — I5033 Acute on chronic diastolic (congestive) heart failure: Secondary | ICD-10-CM | POA: Diagnosis not present

## 2020-02-09 DIAGNOSIS — I872 Venous insufficiency (chronic) (peripheral): Secondary | ICD-10-CM | POA: Diagnosis not present

## 2020-02-13 LAB — POCT INR: INR: 2 (ref 2.0–3.0)

## 2020-02-15 ENCOUNTER — Ambulatory Visit (INDEPENDENT_AMBULATORY_CARE_PROVIDER_SITE_OTHER): Payer: Medicare Other | Admitting: Pharmacist

## 2020-02-15 DIAGNOSIS — I482 Chronic atrial fibrillation, unspecified: Secondary | ICD-10-CM | POA: Diagnosis not present

## 2020-02-15 DIAGNOSIS — Z7901 Long term (current) use of anticoagulants: Secondary | ICD-10-CM

## 2020-02-17 DIAGNOSIS — I5033 Acute on chronic diastolic (congestive) heart failure: Secondary | ICD-10-CM | POA: Diagnosis not present

## 2020-02-19 ENCOUNTER — Ambulatory Visit (INDEPENDENT_AMBULATORY_CARE_PROVIDER_SITE_OTHER): Payer: Medicare Other | Admitting: Pharmacist

## 2020-02-19 ENCOUNTER — Other Ambulatory Visit: Payer: Self-pay | Admitting: Cardiovascular Disease

## 2020-02-19 DIAGNOSIS — Z7901 Long term (current) use of anticoagulants: Secondary | ICD-10-CM

## 2020-02-19 DIAGNOSIS — I482 Chronic atrial fibrillation, unspecified: Secondary | ICD-10-CM

## 2020-02-19 LAB — POCT INR: INR: 3 (ref 2.0–3.0)

## 2020-02-23 ENCOUNTER — Telehealth: Payer: Self-pay | Admitting: *Deleted

## 2020-02-23 NOTE — Telephone Encounter (Signed)
Ok thanks

## 2020-02-23 NOTE — Telephone Encounter (Signed)
-----  Message from Harrington Challenger, [place] sent at 02/19/2020  4:31 PM EDT ----- Please contact patient. She had recent changes in renal function, blood pressure and medication. BP significantly dropped. PCP following but patient may need assessment by cardiologist.  Harrington Challenger PharmD, BCPS, [place] [id number] 02/19/2020 4:33 PM

## 2020-02-23 NOTE — Telephone Encounter (Signed)
Returned the call to the patient. She stated that she had not been feeling good the past couple of weeks with symptoms of fatigue, wheezing and low blood pressures. Her PCP decreased her Carvedilol to 12.5 mg bid and then he increased Furosemide to 20 mg bid. She stated that she does feel better and does not feel like she needs to be seen at this time.   Blood pressure today was 101/59 HR 95  Results from [facility]: Cholesterol, total 147.000 m 01/20/2020 HDL 61.000 mg 01/20/2020 LDL-C 67.000 mg 01/20/2020 Triglycerides 105.000 m 01/20/2020 A1C 6.800 % 01/20/2020 Hemoglobin 13.400 g/ 01/20/2020 Creatinine, Serum 1.360 mg/ 02/17/2020 Potassium 4.900 mm 02/17/2020 Magnesium N/D ALT (SGPT) 14.000 IU/ 02/09/2020 TSH 2.312 08/09/2018 BNP 268.800 03/27/2019 INR 3.000 02/19/2020 Platelets 274.000 x1 01/20/2020  Call placed to her PCP to have the labs and last office visit notes sent over.

## 2020-02-24 ENCOUNTER — Encounter: Payer: Self-pay | Admitting: Cardiovascular Disease

## 2020-02-24 NOTE — Telephone Encounter (Signed)
error

## 2020-02-24 NOTE — Telephone Encounter (Signed)
  This encounter was created in error - please disregard. This encounter was created in error - please disregard.

## 2020-02-26 ENCOUNTER — Ambulatory Visit (INDEPENDENT_AMBULATORY_CARE_PROVIDER_SITE_OTHER): Payer: Medicare Other | Admitting: Pharmacist

## 2020-02-26 ENCOUNTER — Telehealth: Payer: Self-pay | Admitting: Cardiovascular Disease

## 2020-02-26 DIAGNOSIS — I482 Chronic atrial fibrillation, unspecified: Secondary | ICD-10-CM | POA: Diagnosis not present

## 2020-02-26 DIAGNOSIS — Z7901 Long term (current) use of anticoagulants: Secondary | ICD-10-CM

## 2020-02-26 LAB — POCT INR: INR: 3.4 — AB (ref 2.0–3.0)

## 2020-02-26 MED ORDER — ISOSORBIDE MONONITRATE ER 30 MG PO TB24: 30.0000 mg | ORAL_TABLET | Freq: Every day | ORAL | 3 refills | Status: DC

## 2020-02-26 NOTE — Telephone Encounter (Signed)
Carvedilol helps with tachycardia and she should continue to take it.  She should not be taking more than 12.5 mg twice daily.  We can hold benazepril for now and decrease Imdur to 30 mg once daily.

## 2020-02-26 NOTE — Telephone Encounter (Signed)
  Pt c/o medication issue:  1. Name of Medication:  carvedilol (COREG) 25 MG tablet  2. How are you currently taking this medication (dosage and times per day)? 1 tablet twice a day  3. Are you having a reaction (difficulty breathing--STAT)? no  4. What is your medication issue? Patient states the medication makes her feel bad when she takes it in the mornings. She states on Wednesday she felt so bad she almost fainted. She states she did not take it that night and has not taken it since. She would like to know if she can just take the medication in the evening since she does not notice feeling bad then. She states her BP this morning at 8:25am was 129/79 HR 91. She states she would also like to discuss how she is to take her isosorbide and benazepril.

## 2020-02-26 NOTE — Telephone Encounter (Signed)
Pt updated and voiced understanding. Pt also inquiring if MD feels she should make a f/u appointment and how much fluid he recommend she drink a day.

## 2020-02-26 NOTE — Telephone Encounter (Signed)
Spoke with pt who report on 5/20 her BP dropped to 58/43 and pcp had her hold her carvedilol, benazepril, and imdur for 3 days. Pt report on sat she took the carvedilol anyway and felt so bad she thought she was going to pass out (don't remember BP). The Sunday she continued to hold the 3 medications as instructed. On Monday 5/24, pt report her BP that morning was 144/110 and pcp advised that she restart all medication. Pt report she felt so bad after restarting medication she held the carvedilol and benazepril Wednesday night( BP 129/80), and only held both carvedilol dose Thursday and this morning ( systolic 507). Pt report her HR has been averaging between 80-90 and has had occasional high readings of 118, 116,105. Pt state she is uncertain of the dates since she is not home and don't have her book in front of her. Pt is questioning if MD feels she should stop carvedilol all together or only take it at night seeing as she feels this is the medication that's making her feel bad.   Will route to MD for recommendations

## 2020-03-01 NOTE — Telephone Encounter (Signed)
A Follow-up with APP in few weeks is fine.

## 2020-03-01 NOTE — Telephone Encounter (Signed)
Spoke with pt and informed MD recommends a f/u appointment in 2 weeks with an APP. Pt report she is feeling much better and BP has been WNL. Pt agreeable to f/u appointment but voiced she lives an hour away. Pt state a telephone visit would be better.   Virtual appointment scheduled for 6/10 with Almyra Deforest, PA.

## 2020-03-02 ENCOUNTER — Ambulatory Visit (INDEPENDENT_AMBULATORY_CARE_PROVIDER_SITE_OTHER): Payer: Medicare Other | Admitting: Cardiology

## 2020-03-02 DIAGNOSIS — Z7901 Long term (current) use of anticoagulants: Secondary | ICD-10-CM

## 2020-03-02 DIAGNOSIS — I482 Chronic atrial fibrillation, unspecified: Secondary | ICD-10-CM | POA: Diagnosis not present

## 2020-03-02 LAB — POCT INR: INR: 3.6 — AB (ref 2.0–3.0)

## 2020-03-09 ENCOUNTER — Telehealth: Payer: Self-pay | Admitting: Cardiovascular Disease

## 2020-03-09 NOTE — Telephone Encounter (Signed)
Pt c/o BP issue: STAT if pt c/o blurred vision, one-sided weakness or slurred speech  1. What are your last 5 BP readings? 152/87 pulse 80, 161/93 and pulse 80 this was today last night 135/84 pulse 87   2. Are you having any other symptoms (ex. Dizziness, headache, blurred vision, passed out)? Feels like congestion in her chest- pt wants to be seen please asap- she had a Virtual Visit with Isaac Laud on Friday- his entire day was cx- she would like an in office visit  3. What is your BP issue? Running high

## 2020-03-09 NOTE — Telephone Encounter (Signed)
Follow up    Pt is returning call    Please call back

## 2020-03-09 NOTE — Telephone Encounter (Signed)
Spoke with patient earlier regarding blood pressure and congestion She has had very little swelling in lower extremities, not sure if any weight gain does not weigh daily  Having some increase shortness of breath and sleeps on couple of pillows nightly  She had not had her morning medication so she was going to eat and recheck about an hour later Called patient, no voicemail set up. Will try again later

## 2020-03-09 NOTE — Telephone Encounter (Signed)
Spoke with patient and blood pressure donw to 118/72 HR 86 and 134/84 HR 76 Patient stated shortness of breath seems better Patient has had congestion but also stated she was hoarse and sneezed a couple of times No available appointments this week, advised to call PCP  Will forward to Dr Fletcher Anon for review

## 2020-03-10 NOTE — Telephone Encounter (Signed)
Advised patient, verbalized understanding

## 2020-03-10 NOTE — Telephone Encounter (Signed)
Blood pressure looks excellent. I suggest continuing the same medications.

## 2020-03-11 ENCOUNTER — Telehealth: Payer: Medicare Other | Admitting: Physician Assistant

## 2020-03-11 ENCOUNTER — Ambulatory Visit (INDEPENDENT_AMBULATORY_CARE_PROVIDER_SITE_OTHER): Payer: Medicare Other | Admitting: Pharmacist

## 2020-03-11 ENCOUNTER — Telehealth: Payer: Self-pay | Admitting: *Deleted

## 2020-03-11 DIAGNOSIS — I2510 Atherosclerotic heart disease of native coronary artery without angina pectoris: Secondary | ICD-10-CM | POA: Diagnosis not present

## 2020-03-11 DIAGNOSIS — Z7901 Long term (current) use of anticoagulants: Secondary | ICD-10-CM | POA: Diagnosis not present

## 2020-03-11 DIAGNOSIS — I4891 Unspecified atrial fibrillation: Secondary | ICD-10-CM | POA: Diagnosis not present

## 2020-03-11 DIAGNOSIS — I482 Chronic atrial fibrillation, unspecified: Secondary | ICD-10-CM

## 2020-03-11 DIAGNOSIS — I5030 Unspecified diastolic (congestive) heart failure: Secondary | ICD-10-CM | POA: Diagnosis not present

## 2020-03-11 DIAGNOSIS — I110 Hypertensive heart disease with heart failure: Secondary | ICD-10-CM | POA: Diagnosis not present

## 2020-03-11 LAB — POCT INR: INR: 2.5 (ref 2.0–3.0)

## 2020-03-11 NOTE — Telephone Encounter (Signed)
Spoke with pt, we got a copy of her labs from may and her potassium was 5.4. she reports she was not told anything about it. She saw her PCP today and they increased her furosemide because she had gained weight and was volume overloaded. She will cut back on some of the bananas that she is eating. She has a follow up appointment next week with the APP.

## 2020-03-18 ENCOUNTER — Ambulatory Visit (INDEPENDENT_AMBULATORY_CARE_PROVIDER_SITE_OTHER): Payer: Medicare Other | Admitting: Physician Assistant

## 2020-03-18 ENCOUNTER — Ambulatory Visit (INDEPENDENT_AMBULATORY_CARE_PROVIDER_SITE_OTHER): Payer: Medicare Other | Admitting: Pharmacist

## 2020-03-18 ENCOUNTER — Other Ambulatory Visit: Payer: Self-pay

## 2020-03-18 ENCOUNTER — Encounter: Payer: Self-pay | Admitting: Physician Assistant

## 2020-03-18 VITALS — BP 142/84 | HR 109 | Temp 97.1°F | Ht 64.0 in | Wt 198.8 lb

## 2020-03-18 DIAGNOSIS — R0600 Dyspnea, unspecified: Secondary | ICD-10-CM

## 2020-03-18 DIAGNOSIS — I482 Chronic atrial fibrillation, unspecified: Secondary | ICD-10-CM

## 2020-03-18 DIAGNOSIS — I2510 Atherosclerotic heart disease of native coronary artery without angina pectoris: Secondary | ICD-10-CM

## 2020-03-18 DIAGNOSIS — E785 Hyperlipidemia, unspecified: Secondary | ICD-10-CM | POA: Diagnosis not present

## 2020-03-18 DIAGNOSIS — Z7901 Long term (current) use of anticoagulants: Secondary | ICD-10-CM

## 2020-03-18 DIAGNOSIS — E119 Type 2 diabetes mellitus without complications: Secondary | ICD-10-CM

## 2020-03-18 DIAGNOSIS — I10 Essential (primary) hypertension: Secondary | ICD-10-CM | POA: Diagnosis not present

## 2020-03-18 DIAGNOSIS — I5032 Chronic diastolic (congestive) heart failure: Secondary | ICD-10-CM

## 2020-03-18 LAB — POCT INR: INR: 1.6 — AB (ref 2.0–3.0)

## 2020-03-18 MED ORDER — ISOSORBIDE MONONITRATE ER 30 MG PO TB24
30.0000 mg | ORAL_TABLET | Freq: Every day | ORAL | 3 refills | Status: AC
Start: 2020-03-18 — End: 2024-03-19

## 2020-03-18 MED ORDER — CARVEDILOL 25 MG PO TABS: 25.0000 mg | ORAL_TABLET | Freq: Two times a day (BID) | ORAL | 2 refills | Status: AC

## 2020-03-18 NOTE — Progress Notes (Signed)
Cardiology Office Note:    Date:  03/20/2020   ID:  Verlon Carcione [place], [place] 1934/12/21, MRN [id number]  PCP:  Nicoletta Dress, MD  [facility] Cardiologist:  Kathlyn Sacramento, MD  [place] Electrophysiologist:  None   Referring MD: Nicoletta Dress, MD   Chief Complaint  Patient presents with  . Follow-up    seen for Dr. Fletcher Anon    History of Present Illness:    Rebekah Stewart is a 85 y.o. female with a hx of chronic diastolic heart failure, nonobstructive CAD seen on cath 2010, chronic atrial fibrillation, hypertension, hyperlipidemia, and DM 2.  Previous cardiac catheterization in 2010 showed mild to moderate three-vessel nonobstructive disease.  She has chronic bilateral lower extremity edema with stasis dermatitis and chronic venous insufficiency.  Previous lower extremity ABI was normal.  She was unable to afford Eliquis and is maintained on Coumadin instead.  Last Myoview obtained on 07/04/2018 was low risk without significant ischemia.  Based on telephone note in May, it appears she had a significant hypotension.  Benazepril was held, Imdur was cut back to 30 mg daily.  Carvedilol was reduced to 12.5 mg twice daily.  Patient presents today for cardiology follow-up.  She says her PCP has increased her Lasix to 20 mg twice daily for lower extremity edema.  However she continued to have dyspnea and lower extremity edema.  Her weight is not going down.  When the Lasix was increased, she felt bad.  On exam, only right lower extremity has edema, left lower extremity is normal.  Her lung is clear without crackles, rhonchi or wheezing.  Recent INR has been therapeutic, suspicion for lower extremity DVT fairly low.  I recommended more conservative management using leg elevation and salt restriction to help with ipsilateral lower extremity edema.  As far as for dyspnea, I recommended a repeat echocardiogram.  Given her recent increase diuretic, I also recommended basic metabolic panel  and proBNP.  Her heart rate on arrival was poorly controlled in the upper 90s and frequently goes into the 100 range.  I recommended increasing carvedilol back up to 25 mg twice daily while reducing Imdur to 15 mg daily.  I left her on the current dose of 20 mg twice daily of Lasix as she appears to be euvolemic on exam.  I suspect her poorly controlled heart rate is contributing to her dyspnea as well.  The echocardiogram will allow [place] to see if she has any LV dysfunction.  She does have some tightness in the chest while she is dyspneic, however this does not have clear correlation with the degree of exertion.  If the echocardiogram shows lower than normal EF, then I will consider a ischemic work-up.  Otherwise I will see her back in 2 to 4 weeks on a day Dr. Fletcher Anon is also in the office.   Past Medical History:  Diagnosis Date  . Arthritis of back    lumbar  . Atrial fibrillation ([place]) 2010  . Atrial fibrillation ([place])   . Atrial fibrillation ([place])   . Chronic diastolic heart failure ([place])   . Colonic polyp   . Compression fracture of thoracic vertebra (HCC)   . Congestive heart failure, unspecified   . Coronary artery disease    mild non obstructive disease per cardiac cath in 2010  . Degeneration of lumbar or lumbosacral intervertebral disc   . Diverticulosis   . DM2 (diabetes mellitus, type 2) ([place])   . Hemorrhoids   .  HLD (hyperlipidemia)   . HTN (hypertension)    diagnosed when she was in her 6s. Refractory. No RAS by [place] in 2010  . Long-term (current) use of anticoagulants   . Lumbosacral spondylosis without myelopathy   . Osteoarthrosis, unspecified whether generalized or localized, unspecified site   . Pain in thoracic spine   . Swelling of limb   . UTI (lower urinary tract infection)   . Varicose veins of lower extremities with other complications   . Vitamin D deficiency     Past Surgical History:  Procedure Laterality Date  . ADENOIDECTOMY    . CARDIAC CATHETERIZATION   2010   At [place]. Mild nonobstructive CAD  . CORONARY ANGIOPLASTY    . EYE SURGERY    . TONSILLECTOMY      Current Medications: Current Meds  Medication Sig  . allopurinol (ZYLOPRIM) 300 MG tablet Take 1 tablet (300 mg total) by mouth daily as needed (for gout).  Marland Kitchen atorvastatin (LIPITOR) 40 MG tablet TAKE 1 TABLET BY MOUTH EVERY DAY  . carvedilol (COREG) 25 MG tablet Take 1 tablet (25 mg total) by mouth 2 (two) times daily with a meal.  . Cholecalciferol (VITAMIN D) 2000 UNITS CAPS Take 1 capsule by mouth daily.  . fish oil-omega-3 fatty acids 1000 MG capsule Take 2 g by mouth 2 (two) times daily.   . furosemide (LASIX) 20 MG tablet TAKE 1 TABLET BY MOUTH EVERY DAY ALTERNATING 20MG & 40MG (Patient taking differently: Take 20 mg by mouth 2 (two) times daily. )  . glucose blood (ACCU-CHEK AVIVA PLUS) test strip Check two times daily. Dx code E11.65  . Lancets (ONETOUCH DELICA PLUS [id number]) MISC 1 each by Does not apply route 2 (two) times daily before a meal. Use to check blood sugars twilce daily before meal  . metFORMIN (GLUCOPHAGE) 500 MG tablet Take 1 tablet (500 mg total) by mouth 3 (three) times daily with meals.  . warfarin (COUMADIN) 5 MG tablet Take 5 mg by mouth daily.  . [DISCONTINUED] carvedilol (COREG) 25 MG tablet TAKE 1 TABLET BY MOUTH TWICE A DAY WITH MEALS (Patient taking differently: 12.5 mg 2 (two) times daily with a meal. )  . [DISCONTINUED] isosorbide mononitrate (IMDUR) 30 MG 24 hr tablet Take 1 tablet (30 mg total) by mouth daily.     Allergies:   Bee venom, Iodinated diagnostic agents, Penicillins, and Tape   Social History   Socioeconomic History  . Marital status: Widowed    Spouse name: Not on file  . Number of children: Not on file  . Years of education: Not on file  . Highest education level: Not on file  Occupational History  . Occupation: retired  Tobacco Use  . Smoking status: Never Smoker  . Smokeless tobacco: Never Used  Substance and  Sexual Activity  . Alcohol use: No  . Drug use: No  . Sexual activity: Not Currently  Other Topics Concern  . Not on file  Social History Narrative  . Not on file   Social Determinants of Health   Financial Resource Strain:   . Difficulty of Paying Living Expenses:   Food Insecurity:   . Worried About Charity fundraiser in the Last Year:   . Arboriculturist in the Last Year:   Transportation Needs:   . Film/video editor (Medical):   Marland Kitchen Lack of Transportation (Non-Medical):   Physical Activity:   . Days of Exercise per Week:   .  Minutes of Exercise per Session:   Stress:   . Feeling of Stress :   Social Connections:   . Frequency of Communication with Friends and Family:   . Frequency of Social Gatherings with Friends and Family:   . Attends Religious Services:   . Active Member of Clubs or Organizations:   . Attends Archivist Meetings:   Marland Kitchen Marital Status:      Family History: The patient's family history includes Cancer in her sister; Heart attack in her father; Heart disease in her brother, father, mother, and another family member; Stroke in her mother.  ROS:   Please see the history of present illness.     All other systems reviewed and are negative.  EKGs/Labs/Other Studies Reviewed:    The following studies were reviewed today:  Myoview 07/04/2018  No T wave inversion was noted during stress.  There was no ST segment deviation noted during stress.   Normal perfusion. Study was not gated d/t a-fib. Low risk from a perfusion standpoint.    Echo 08/09/2018 LV EF: 55% -  60%  Study Conclusions   - Left ventricle: The cavity size was normal. Wall thickness was  increased in a pattern of mild LVH. Systolic function was normal.  The estimated ejection fraction was in the range of 55% to 60%.  Wall motion was normal; there were no regional wall motion  abnormalities. The study was not technically sufficient to allow  evaluation of  LV diastolic dysfunction due to atrial  fibrillation.  - Aortic valve: Mildly calcified annulus. Trileaflet. There was  mild regurgitation.  - Mitral valve: Mildly calcified annulus.  - Left atrium: The atrium was mildly dilated.  - Right ventricle: Systolic function was mildly reduced.  - Tricuspid valve: There was moderate regurgitation.  - Pulmonary arteries: PA peak pressure: 59 mm Hg (S).  - Systemic veins: IVC is dilated with normal respiratory variation.  Estimated CVP 8 mmHg.   EKG:  EKG is ordered today.  The ekg ordered today demonstrates atrial fibrillation, heart rate 109  Recent Labs: 03/27/2019: ALT 17 07/14/2019: Hemoglobin 14.0; Platelets 257 03/18/2020: BNP 205.5; BUN 20; Creatinine, Ser 1.30; Potassium 4.2; Sodium 146  Recent Lipid Panel    Component Value Date/Time   CHOL 129 03/27/2019 0000   TRIG 79 03/27/2019 0000   HDL 62 03/27/2019 0000   CHOLHDL 2.1 03/27/2019 0000   LDLCALC 51 03/27/2019 0000    Physical Exam:    VS:  BP (!) 142/84   Pulse (!) 109   Temp (!) 97.1 F (36.2 C)   Ht _0  (1.626 m)   Wt 198 lb 12.8 oz (90.2 kg)   SpO2 93%   BMI 34.12 kg/m     Wt Readings from Last 3 Encounters:  03/18/20 198 lb 12.8 oz (90.2 kg)  12/22/19 197 lb (89.4 kg)  07/14/19 184 lb 3.2 oz (83.6 kg)     GEN:  Well nourished, well developed in no acute distress HEENT: Normal NECK: No JVD; No carotid bruits LYMPHATICS: No lymphadenopathy CARDIAC: RRR, no murmurs, rubs, gallops RESPIRATORY:  Clear to auscultation without rales, wheezing or rhonchi  ABDOMEN: Soft, non-tender, non-distended MUSCULOSKELETAL:  No edema; No deformity  SKIN: Warm and dry NEUROLOGIC:  Alert and oriented x 3 PSYCHIATRIC:  Normal affect   ASSESSMENT:    1. Dyspnea, unspecified type   2. Chronic atrial fibrillation (HCC)   3. Essential hypertension   4. Coronary artery disease involving native coronary artery  of native heart without angina pectoris   5. Chronic  diastolic heart failure ([place])   6. Hyperlipidemia LDL goal <100   7. Controlled type 2 diabetes mellitus without complication, without long-term current use of insulin (HCC)    PLAN:    In order of problems listed above:  1. Dyspnea: I recommend a echocardiogram, basic metabolic panel and proBNP.  Her PCP has recently increased her diuretic, on exam, she only has right lower extremity edema, her left lower extremity is normal.  I do not think she is significantly volume overloaded.  Since her INR has been therapeutic, suspicion for DVT fairly low.  Heart rate is poorly controlled, increase carvedilol for better control of heart rate which may contribute to her dyspnea.  2. Chronic atrial fibrillation: She is on Coumadin and rate control therapy.  Unfortunately heart rate has been poorly controlled in the 90s and low 100 range.  I recommended increase carvedilol back up to 25 mg twice daily for better blood pressure control  3. Hypertension: Increase carvedilol to 25 mg twice daily, decrease Imdur to 15 mg daily.  4. CAD: Previous cardiac catheterization in 2010 showed nonobstructive disease.  She has been taking Imdur.  She does have occasional chest tightness, however this does not related to the degree of exertion.  I recommended echocardiogram first as initial evaluation, if her ejection fraction is abnormal, then we will consider further ischemic work-up  5. Chronic diastolic heart failure: Her Lasix was increased recently, however on exam, she does not appears to be significantly volume overloaded.  Higher dose of Lasix up to make her feel very poorly.  We discussed conservative management including salt restriction, fluid restriction, and daily weight.  She only has right lower extremity edema and does not have any left lower extremity edema.  I recommended leg elevation.  6. Hyperlipidemia: Continue statin therapy  7. DM2: Managed by primary care provider.   Medication Adjustments/Labs  and Tests Ordered: Current medicines are reviewed at length with the patient today.  Concerns regarding medicines are outlined above.  Orders Placed This Encounter  Procedures  . Brain natriuretic peptide  . Basic metabolic panel  . EKG 12-Lead  . ECHOCARDIOGRAM COMPLETE   Meds ordered this encounter  Medications  . carvedilol (COREG) 25 MG tablet    Sig: Take 1 tablet (25 mg total) by mouth 2 (two) times daily with a meal.    Dispense:  180 tablet    Refill:  2  . isosorbide mononitrate (IMDUR) 30 MG 24 hr tablet    Sig: Take 1 tablet (30 mg total) by mouth daily. Take 1/2 tablet 15 mg daily    Dispense:  90 tablet    Refill:  3    Patient Instructions  Medication Instructions:  Lasix 20 mg 2 times Daily, Carvedilol 59m Twice Daily, Imdur 15 mg Half (1/2 372m Dail;y *If you need a refill on your cardiac medications before your next appointment, please call your pharmacy*   Lab Work: BMP,BNP If you have labs (blood work) drawn today and your tests are completely normal, you will receive your results only by: . Marland KitchenyChart Message (if you have MyChart) OR . A paper copy in the mail If you have any lab test that is abnormal or we need to change your treatment, we will call you to review the results.   Testing/Procedures: [id number] requested that you have an echocardiogram. Echocardiography is a painless test that  uses sound waves to create images of your heart. It provides your doctor with information about the size and shape of your heart and how well your heart's chambers and valves are working. This procedure takes approximately one hour. There are no restrictions for this procedure.    Follow-Up: At [facility] you and your health needs are our priority.  As part of our continuing mission to provide you with exceptional heart care, we have created designated Provider Care Teams.  These Care Teams include your primary  Cardiologist (physician) and Advanced Practice Providers (APPs -  Physician Assistants and Nurse Practitioners) who all work together to provide you with the care you need, when you need it.  We recommend signing up for the patient portal called "MyChart".  Sign up information is provided on this After Visit Summary.  MyChart is used to connect with patients for Virtual Visits (Telemedicine).  Patients are able to view lab/test results, encounter notes, upcoming appointments, etc.  Non-urgent messages can be sent to your provider as well.   To learn more about what you can do with MyChart, go to [email or web address].    Your next appointment:   2-4 Weeks  The format for your next appointment:   In Person  Provider:   Almyra Deforest PA-C   Other Instructions Please Schedule when Dr. Fletcher Anon is in office    Signed, Almyra Deforest, [place]  03/20/2020 11:52 PM    [place]

## 2020-03-18 NOTE — Patient Instructions (Addendum)
Medication Instructions:  Lasix 20 mg 2 times Daily, Carvedilol 65m Twice Daily, Imdur 15 mg Half (1/2 341m Dail;y *If you need a refill on your cardiac medications before your next appointment, please call your pharmacy*   Lab Work: BMP,BNP If you have labs (blood work) drawn today and your tests are completely normal, you will receive your results only by:  MyGeneseeif you have MyChart) OR  A paper copy in the mail If you have any lab test that is abnormal or we need to change your treatment, we will call you to review the results.   Testing/Procedures: [address] requested that you have an echocardiogram. Echocardiography is a painless test that uses sound waves to create images of your heart. It provides your doctor with information about the size and shape of your heart and how well your hearts chambers and valves are working. This procedure takes approximately one hour. There are no restrictions for this procedure.    Follow-Up: [facility].  As part of our continuing mission to provide you with exceptional heart care, we have created designated Provider Care Teams.  These Care Teams include your primary Cardiologist (physician) and Advanced Practice Providers (APPs -  Physician Assistants and Nurse Practitioners) who all work together to provide you with the care you need, when you need it.  We recommend signing up for the patient portal called "MyChart".  Sign up information is provided on this After Visit Summary.  MyChart is used to connect with patients for Virtual Visits (Telemedicine).  Patients are able to view lab/test results, encounter notes, upcoming appointments, etc.  Non-urgent messages can be sent to your provider as well.   To learn more about what you can do with MyChart, go to [email or web address]   Your next appointment:   2-4 Weeks  The format for your  next appointment:   In Person  Provider:   MeAlmyra DeforestA-C   Other Instructions Please Schedule when Dr. ArFletcher Anons in office

## 2020-03-19 LAB — BASIC METABOLIC PANEL
BUN/Creatinine Ratio: 15 (ref 12–28)
BUN: 20 mg/dL (ref 8–27)
CO2: 22 mmol/L (ref 20–29)
Calcium: 8.9 mg/dL (ref 8.7–10.3)
Chloride: 103 mmol/L (ref 96–106)
Creatinine, Ser: 1.3 mg/dL — ABNORMAL HIGH (ref 0.57–1.00)
GFR calc Af Amer: 44 mL/min/{1.73_m2} — ABNORMAL LOW (ref >59)
GFR calc non Af Amer: 38 mL/min/{1.73_m2} — ABNORMAL LOW (ref >59)
Glucose: 93 mg/dL (ref 65–99)
Potassium: 4.2 mmol/L (ref 3.5–5.2)
Sodium: 146 mmol/L — ABNORMAL HIGH (ref 134–144)

## 2020-03-19 LAB — BRAIN NATRIURETIC PEPTIDE: BNP: 205.5 pg/mL — ABNORMAL HIGH (ref 0.0–100.0)

## 2020-03-20 ENCOUNTER — Encounter: Payer: Self-pay | Admitting: Physician Assistant

## 2020-03-25 ENCOUNTER — Ambulatory Visit (INDEPENDENT_AMBULATORY_CARE_PROVIDER_SITE_OTHER): Payer: Medicare Other | Admitting: Pharmacist Clinician (PhC)/ Clinical Pharmacy Specialist

## 2020-03-25 ENCOUNTER — Ambulatory Visit: Payer: Medicare Other | Admitting: Sports Medicine

## 2020-03-25 DIAGNOSIS — I482 Chronic atrial fibrillation, unspecified: Secondary | ICD-10-CM

## 2020-03-25 DIAGNOSIS — Z7901 Long term (current) use of anticoagulants: Secondary | ICD-10-CM

## 2020-03-25 LAB — POCT INR: INR: 2.2 (ref 2.0–3.0)

## 2020-04-02 ENCOUNTER — Encounter: Payer: Self-pay | Admitting: Physician Assistant

## 2020-04-02 LAB — PROTIME-INR: INR: 2.9 — AB (ref 0.9–1.1)

## 2020-04-05 ENCOUNTER — Ambulatory Visit (INDEPENDENT_AMBULATORY_CARE_PROVIDER_SITE_OTHER): Payer: Medicare Other | Admitting: Pharmacist Clinician (PhC)/ Clinical Pharmacy Specialist

## 2020-04-05 DIAGNOSIS — I482 Chronic atrial fibrillation, unspecified: Secondary | ICD-10-CM | POA: Diagnosis not present

## 2020-04-05 DIAGNOSIS — Z7901 Long term (current) use of anticoagulants: Secondary | ICD-10-CM

## 2020-04-05 LAB — POCT INR: INR: 2.9 (ref 2.0–3.0)

## 2020-04-07 ENCOUNTER — Ambulatory Visit: Payer: Medicare Other | Admitting: Podiatry

## 2020-04-10 LAB — POCT INR: INR: 2.9 (ref 2.0–3.0)

## 2020-04-11 ENCOUNTER — Ambulatory Visit (HOSPITAL_COMMUNITY): Payer: Medicare Other | Attending: Cardiology

## 2020-04-11 ENCOUNTER — Telehealth: Payer: Self-pay | Admitting: Cardiovascular Disease

## 2020-04-11 ENCOUNTER — Ambulatory Visit (INDEPENDENT_AMBULATORY_CARE_PROVIDER_SITE_OTHER): Payer: Medicare Other | Admitting: Cardiovascular Disease

## 2020-04-11 ENCOUNTER — Other Ambulatory Visit: Payer: Self-pay

## 2020-04-11 DIAGNOSIS — I2510 Atherosclerotic heart disease of native coronary artery without angina pectoris: Secondary | ICD-10-CM

## 2020-04-11 DIAGNOSIS — I10 Essential (primary) hypertension: Secondary | ICD-10-CM | POA: Diagnosis not present

## 2020-04-11 DIAGNOSIS — Z7901 Long term (current) use of anticoagulants: Secondary | ICD-10-CM

## 2020-04-11 DIAGNOSIS — I482 Chronic atrial fibrillation, unspecified: Secondary | ICD-10-CM | POA: Diagnosis not present

## 2020-04-11 NOTE — Telephone Encounter (Signed)
Haryley with [employer] is calling to follow up in regards to an oxygen request form that was faxed to our office. She states on 04/05/20 the form was returned blank. She would like to speak with Dr. Tyrell Antonio nurse to determine whether or not this was intentional. Please return call to discuss at [phone] (ext: [phone]).

## 2020-04-11 NOTE — Telephone Encounter (Signed)
Rebekah Stewart has been called and notified that per the note that was sent back with the papers that they will need to have PCP sign for the oxygen. That information has been given.

## 2020-04-12 ENCOUNTER — Encounter: Payer: Self-pay | Admitting: Physician Assistant

## 2020-04-12 ENCOUNTER — Ambulatory Visit (INDEPENDENT_AMBULATORY_CARE_PROVIDER_SITE_OTHER): Payer: Medicare Other | Admitting: Physician Assistant

## 2020-04-12 VITALS — BP 125/77 | HR 119 | Ht 64.0 in | Wt 196.4 lb

## 2020-04-12 DIAGNOSIS — E119 Type 2 diabetes mellitus without complications: Secondary | ICD-10-CM

## 2020-04-12 DIAGNOSIS — Z79899 Other long term (current) drug therapy: Secondary | ICD-10-CM

## 2020-04-12 DIAGNOSIS — R0600 Dyspnea, unspecified: Secondary | ICD-10-CM

## 2020-04-12 DIAGNOSIS — I482 Chronic atrial fibrillation, unspecified: Secondary | ICD-10-CM

## 2020-04-12 DIAGNOSIS — I2510 Atherosclerotic heart disease of native coronary artery without angina pectoris: Secondary | ICD-10-CM | POA: Diagnosis not present

## 2020-04-12 DIAGNOSIS — I10 Essential (primary) hypertension: Secondary | ICD-10-CM

## 2020-04-12 DIAGNOSIS — I5032 Chronic diastolic (congestive) heart failure: Secondary | ICD-10-CM

## 2020-04-12 DIAGNOSIS — E785 Hyperlipidemia, unspecified: Secondary | ICD-10-CM

## 2020-04-12 MED ORDER — DIGOXIN 125 MCG PO TABS: 0.0625 mg | ORAL_TABLET | Freq: Every day | ORAL | 0 refills | Status: DC

## 2020-04-12 NOTE — Progress Notes (Signed)
Cardiology Office Note:    Date:  04/14/2020   ID:  Clinton Quant, [place] 05-26-1935, MRN [id number]  PCP:  Nicoletta Dress, MD  [facility] Cardiologist:  Kathlyn Sacramento, MD  [place] Electrophysiologist:  None   Referring MD: Nicoletta Dress, MD   Chief Complaint  Patient presents with  . Follow-up    seen for Dr. Fletcher Anon    History of Present Illness:    Rebekah Stewart is a 85 y.o. female with a hx of chronic diastolic heart failure, nonobstructive CAD seen on cath 2010, chronic atrial fibrillation, HTN, HLD, and DM 2.  Previous cardiac catheterization in 2010 showed mild to moderate three-vessel nonobstructive disease.  She has chronic bilateral lower extremity edema with stasis dermatitis and chronic venous insufficiency.  Previous lower extremity ABI was normal.  She was unable to afford Eliquis and is maintained on Coumadin instead.  Last Myoview obtained on 07/04/2018 was low risk without significant ischemia.  Based on telephone note in May, it appears she had a significant hypotension.  Benazepril was held, Imdur was cut back to 30 mg daily.  Carvedilol was reduced to 12.5 mg twice daily.  During the previous office visit on 03/18/2020, she had a right lower extremity edema however no significant edema in the left lower extremity.  Suspicion for lower extremity DVT was low's patient's INR was therapeutic.  As far as her dyspnea, recommend a repeat echocardiogram.  Her PCP has recently increased her diuretic.  Her heart rate was poorly controlled, therefore I increased her carvedilol to 25 mg twice a day while decreasing the Imdur to 15 mg daily.  I suspected that her poorly controlled heart rate was contributing to her dyspnea as well.  Patient presents today for cardiology office visit.  Unfortunately given was the increased dose of carvedilol, her heart rate remained elevated.  Shortly after arrival, her heart rate was 120 bpm walking from the waiting room to the  exam room.  In order to assess her heart rate and O2 saturation, we ambulated in the office again during today's interview.  O2 saturation remained greater than 88%, however heart rate does increase to 120-140s after short distance.  This elevated heart rate is likely due to combination of her advanced age, deconditioning and poorly controlled atrial fibrillation.  I recommend the addition of very low-dose of digoxin to help control her heart rate and see if this may control her heart rate better and allow her to ambulate without significant jump in the heart rate.  Past Medical History:  Diagnosis Date  . Arthritis of back    lumbar  . Atrial fibrillation ([place]) 2010  . Atrial fibrillation ([place])   . Atrial fibrillation ([place])   . Chronic diastolic heart failure ([place])   . Colonic polyp   . Compression fracture of thoracic vertebra (HCC)   . Congestive heart failure, unspecified   . Coronary artery disease    mild non obstructive disease per cardiac cath in 2010  . Degeneration of lumbar or lumbosacral intervertebral disc   . Diverticulosis   . DM2 (diabetes mellitus, type 2) ([place])   . Hemorrhoids   . HLD (hyperlipidemia)   . HTN (hypertension)    diagnosed when she was in her 9s. Refractory. No RAS by [place] in 2010  . Long-term (current) use of anticoagulants   . Lumbosacral spondylosis without myelopathy   . Osteoarthrosis, unspecified whether generalized or localized, unspecified site   . Pain in thoracic spine   .  Swelling of limb   . UTI (lower urinary tract infection)   . Varicose veins of lower extremities with other complications   . Vitamin D deficiency     Past Surgical History:  Procedure Laterality Date  . ADENOIDECTOMY    . CARDIAC CATHETERIZATION  2010   At [place]. Mild nonobstructive CAD  . CORONARY ANGIOPLASTY    . EYE SURGERY    . TONSILLECTOMY      Current Medications: Current Meds  Medication Sig  . allopurinol (ZYLOPRIM) 300 MG tablet Take 1 tablet  (300 mg total) by mouth daily as needed (for gout).  Marland Kitchen atorvastatin (LIPITOR) 40 MG tablet TAKE 1 TABLET BY MOUTH EVERY DAY  . carvedilol (COREG) 25 MG tablet Take 1 tablet (25 mg total) by mouth 2 (two) times daily with a meal.  . Cholecalciferol (VITAMIN D) 2000 UNITS CAPS Take 1 capsule by mouth daily.  . fish oil-omega-3 fatty acids 1000 MG capsule Take 2 g by mouth 2 (two) times daily.   . furosemide (LASIX) 20 MG tablet TAKE 1 TABLET BY MOUTH EVERY DAY ALTERNATING 20MG & 40MG (Patient taking differently: Take 20 mg by mouth 2 (two) times daily. )  . glucose blood (ACCU-CHEK AVIVA PLUS) test strip Check two times daily. Dx code E11.65  . isosorbide mononitrate (IMDUR) 30 MG 24 hr tablet Take 1 tablet (30 mg total) by mouth daily. Take 1/2 tablet 15 mg daily  . Lancets (ONETOUCH DELICA PLUS FHQRFX58I) MISC 1 each by Does not apply route 2 (two) times daily before a meal. Use to check blood sugars twilce daily before meal  . metFORMIN (GLUCOPHAGE) 500 MG tablet Take 1 tablet (500 mg total) by mouth 3 (three) times daily with meals.  . warfarin (COUMADIN) 5 MG tablet Take 5 mg by mouth daily.     Allergies:   Bee venom, Iodinated diagnostic agents, Penicillins, and Tape   Social History   Socioeconomic History  . Marital status: Widowed    Spouse name: Not on file  . Number of children: Not on file  . Years of education: Not on file  . Highest education level: Not on file  Occupational History  . Occupation: retired  Tobacco Use  . Smoking status: Never Smoker  . Smokeless tobacco: Never Used  Substance and Sexual Activity  . Alcohol use: No  . Drug use: No  . Sexual activity: Not Currently  Other Topics Concern  . Not on file  Social History Narrative  . Not on file   Social Determinants of Health   Financial Resource Strain:   . Difficulty of Paying Living Expenses:   Food Insecurity:   . Worried About Charity fundraiser in the Last Year:   . Arboriculturist in the  Last Year:   Transportation Needs:   . Film/video editor (Medical):   Marland Kitchen Lack of Transportation (Non-Medical):   Physical Activity:   . Days of Exercise per Week:   . Minutes of Exercise per Session:   Stress:   . Feeling of Stress :   Social Connections:   . Frequency of Communication with Friends and Family:   . Frequency of Social Gatherings with Friends and Family:   . Attends Religious Services:   . Active Member of Clubs or Organizations:   . Attends Archivist Meetings:   Marland Kitchen Marital Status:      Family History: The patient's family history includes Cancer in her sister; Heart  attack in her father; Heart disease in her brother, father, mother, and another family member; Stroke in her mother.  ROS:   Please see the history of present illness.     All other systems reviewed and are negative.  EKGs/Labs/Other Studies Reviewed:    The following studies were reviewed today:  Echo 04/11/2020 1. Left ventricular ejection fraction, by estimation, is 60 to 65%. The  left ventricle has normal function. The left ventricle has no regional  wall motion abnormalities. Left ventricular diastolic function could not  be evaluated.  2. Right ventricular systolic function is normal. The right ventricular  size is normal. There is mildly elevated pulmonary artery systolic  pressure. The estimated right ventricular systolic pressure is 63.8 mmHg.  3. Left atrial size was moderately dilated.  4. Right atrial size was mildly dilated.  5. The mitral valve is normal in structure. No evidence of mitral valve  regurgitation. No evidence of mitral stenosis.  6. The aortic valve is normal in structure. Aortic valve regurgitation is  mild. No aortic stenosis is present.  7. The inferior vena cava is normal in size with greater than 50%  respiratory variability, suggesting right atrial pressure of 3 mmHg.   EKG:  EKG is ordered today.  The ekg ordered today demonstrates atrial  fibrillation, heart rate 88  Recent Labs: 07/14/2019: Hemoglobin 14.0; Platelets 257 03/18/2020: BNP 205.5; BUN 20; Creatinine, Ser 1.30; Potassium 4.2; Sodium 146  Recent Lipid Panel    Component Value Date/Time   CHOL 129 03/27/2019 0000   TRIG 79 03/27/2019 0000   HDL 62 03/27/2019 0000   CHOLHDL 2.1 03/27/2019 0000   LDLCALC 51 03/27/2019 0000    Physical Exam:    VS:  BP 125/77   Pulse (!) 119   Ht _0  (1.626 m)   Wt 196 lb 6.4 oz (89.1 kg)   SpO2 95%   BMI 33.71 kg/m     Wt Readings from Last 3 Encounters:  04/12/20 196 lb 6.4 oz (89.1 kg)  03/18/20 198 lb 12.8 oz (90.2 kg)  12/22/19 197 lb (89.4 kg)     GEN:  Well nourished, well developed in no acute distress HEENT: Normal NECK: No JVD; No carotid bruits LYMPHATICS: No lymphadenopathy CARDIAC: Irregularly irregular, no murmurs, rubs, gallops RESPIRATORY:  Clear to auscultation without rales, wheezing or rhonchi  ABDOMEN: Soft, non-tender, non-distended MUSCULOSKELETAL:  No edema; No deformity  SKIN: Warm and dry NEUROLOGIC:  Alert and oriented x 3 PSYCHIATRIC:  Normal affect   ASSESSMENT:    1. Dyspnea, unspecified type   2. Medication management   3. Chronic atrial fibrillation ([place])   4. Essential hypertension   5. Hyperlipidemia LDL goal <70   6. Controlled type 2 diabetes mellitus without complication, without long-term current use of insulin ([place])   7. Coronary artery disease involving native coronary artery of native heart without angina pectoris   8. Chronic diastolic heart failure (HCC)    PLAN:    In order of problems listed above:  1. Dyspnea on exertion: Her main concern is dyspnea on exertion today, she does not have any significant chest pain.  I suspect her dyspnea on exertion is related to her advanced age, deconditioning and poorly controlled heart rate.  In order to control her heart rate a little better, I started her on very low-dose of digoxin.  She will need a digoxin level in 1  week  2. Chronic atrial fibrillation: Heart rate is controlled at rest,  however increase significantly with minimal exertion.  I started her on low-dose digoxin.  3. Hypertension: Blood pressure controlled  4. Hyperlipidemia: On Lipitor  5. DM2: Managed by primary care provider  6. CAD: Nonobstructive disease noted on previous cardiac catheterization.  Denies any recent angina symptoms.  7. Chronic diastolic heart failure: Euvolemic on physical exam.  I do not think her dyspnea on exertion is related to volume overload.   Medication Adjustments/Labs and Tests Ordered: Current medicines are reviewed at length with the patient today.  Concerns regarding medicines are outlined above.  Orders Placed This Encounter  Procedures  . Basic metabolic panel  . Digoxin level  . EKG 12-Lead   Meds ordered this encounter  Medications  . digoxin (LANOXIN) 0.125 MG tablet    Sig: Take 0.5 tablets (0.0625 mg total) by mouth daily.    Dispense:  30 tablet    Refill:  0    Patient Instructions  Medication Instructions:   START Digoxin 0.0625 mg daily  *If you need a refill on your cardiac medications before your next appointment, please call your pharmacy*  Lab Work: Your physician recommends that you return for lab work in [place] 04/19/2020:   BMET  DIGOXIN LEVEL  If you have labs (blood work) drawn today and your tests are completely normal, you will receive your results only by: Marland Kitchen MyChart Message (if you have MyChart) OR . A paper copy in the mail If you have any lab test that is abnormal or we need to change your treatment, we will call you to review the results.  Testing/Procedures: NONE ordered at this time of appointment   Follow-Up: At [facility], you and your health needs are our priority.  As part of our continuing mission to provide you with exceptional heart care, we have created designated Provider Care Teams.  These Care Teams include your primary Cardiologist  (physician) and Advanced Practice Providers (APPs -  Physician Assistants and Nurse Practitioners) who all work together to provide you with the care you need, when you need it.  We recommend signing up for the patient portal called "MyChart".  Sign up information is provided on this After Visit Summary.  MyChart is used to connect with patients for Virtual Visits (Telemedicine).  Patients are able to view lab/test results, encounter notes, upcoming appointments, etc.  Non-urgent messages can be sent to your provider as well.   To learn more about what you can do with MyChart, go to [email or web address].    Your next appointment:   1 month(s)   The format for your next appointment:   In Person  Provider:   Almyra Deforest, PA-C  Other Instructions      Signed, Almyra Deforest, [place]  04/14/2020 11:49 PM    [place]

## 2020-04-12 NOTE — Patient Instructions (Addendum)
Medication Instructions:   START Digoxin 0.0625 mg daily  *If you need a refill on your cardiac medications before your next appointment, please call your pharmacy*  Lab Work: Your physician recommends that you return for lab work in [place] 04/19/2020:   BMET  DIGOXIN LEVEL  If you have labs (blood work) drawn today and your tests are completely normal, you will receive your results only by: Marland Kitchen MyChart Message (if you have MyChart) OR . A paper copy in the mail If you have any lab test that is abnormal or we need to change your treatment, we will call you to review the results.  Testing/Procedures: NONE ordered at this time of appointment   Follow-Up: At [facility], you and your health needs are our priority.  As part of our continuing mission to provide you with exceptional heart care, we have created designated Provider Care Teams.  These Care Teams include your primary Cardiologist (physician) and Advanced Practice Providers (APPs -  Physician Assistants and Nurse Practitioners) who all work together to provide you with the care you need, when you need it.  We recommend signing up for the patient portal called "MyChart".  Sign up information is provided on this After Visit Summary.  MyChart is used to connect with patients for Virtual Visits (Telemedicine).  Patients are able to view lab/test results, encounter notes, upcoming appointments, etc.  Non-urgent messages can be sent to your provider as well.   To learn more about what you can do with MyChart, go to [email or web address].    Your next appointment:   1 month(s)   The format for your next appointment:   In Person  Provider:   Almyra Deforest, PA-C  Other Instructions

## 2020-04-14 ENCOUNTER — Encounter: Payer: Self-pay | Admitting: Physician Assistant

## 2020-04-16 LAB — POCT INR: INR: 2.4 (ref 2.0–3.0)

## 2020-04-18 ENCOUNTER — Ambulatory Visit (INDEPENDENT_AMBULATORY_CARE_PROVIDER_SITE_OTHER): Payer: Medicare Other | Admitting: Internal Medicine

## 2020-04-18 ENCOUNTER — Telehealth: Payer: Self-pay | Admitting: Physician Assistant

## 2020-04-18 DIAGNOSIS — I21A1 Myocardial infarction type 2: Secondary | ICD-10-CM | POA: Diagnosis not present

## 2020-04-18 DIAGNOSIS — J449 Chronic obstructive pulmonary disease, unspecified: Secondary | ICD-10-CM | POA: Diagnosis not present

## 2020-04-18 DIAGNOSIS — I5042 Chronic combined systolic (congestive) and diastolic (congestive) heart failure: Secondary | ICD-10-CM | POA: Diagnosis not present

## 2020-04-18 DIAGNOSIS — M109 Gout, unspecified: Secondary | ICD-10-CM | POA: Diagnosis present

## 2020-04-18 DIAGNOSIS — R7989 Other specified abnormal findings of blood chemistry: Secondary | ICD-10-CM | POA: Diagnosis not present

## 2020-04-18 DIAGNOSIS — Z91041 Radiographic dye allergy status: Secondary | ICD-10-CM | POA: Diagnosis not present

## 2020-04-18 DIAGNOSIS — Z79899 Other long term (current) drug therapy: Secondary | ICD-10-CM | POA: Diagnosis not present

## 2020-04-18 DIAGNOSIS — I4891 Unspecified atrial fibrillation: Secondary | ICD-10-CM | POA: Diagnosis not present

## 2020-04-18 DIAGNOSIS — I4821 Permanent atrial fibrillation: Secondary | ICD-10-CM | POA: Diagnosis not present

## 2020-04-18 DIAGNOSIS — I272 Pulmonary hypertension, unspecified: Secondary | ICD-10-CM | POA: Diagnosis present

## 2020-04-18 DIAGNOSIS — I10 Essential (primary) hypertension: Secondary | ICD-10-CM | POA: Diagnosis not present

## 2020-04-18 DIAGNOSIS — Z794 Long term (current) use of insulin: Secondary | ICD-10-CM | POA: Diagnosis not present

## 2020-04-18 DIAGNOSIS — I429 Cardiomyopathy, unspecified: Secondary | ICD-10-CM | POA: Diagnosis not present

## 2020-04-18 DIAGNOSIS — Z7901 Long term (current) use of anticoagulants: Secondary | ICD-10-CM | POA: Diagnosis not present

## 2020-04-18 DIAGNOSIS — I5033 Acute on chronic diastolic (congestive) heart failure: Secondary | ICD-10-CM | POA: Diagnosis not present

## 2020-04-18 DIAGNOSIS — R778 Other specified abnormalities of plasma proteins: Secondary | ICD-10-CM | POA: Diagnosis not present

## 2020-04-18 DIAGNOSIS — I5020 Unspecified systolic (congestive) heart failure: Secondary | ICD-10-CM | POA: Diagnosis not present

## 2020-04-18 DIAGNOSIS — E876 Hypokalemia: Secondary | ICD-10-CM | POA: Diagnosis not present

## 2020-04-18 DIAGNOSIS — Z7984 Long term (current) use of oral hypoglycemic drugs: Secondary | ICD-10-CM | POA: Diagnosis not present

## 2020-04-18 DIAGNOSIS — I2510 Atherosclerotic heart disease of native coronary artery without angina pectoris: Secondary | ICD-10-CM | POA: Diagnosis not present

## 2020-04-18 DIAGNOSIS — I5043 Acute on chronic combined systolic (congestive) and diastolic (congestive) heart failure: Secondary | ICD-10-CM | POA: Diagnosis not present

## 2020-04-18 DIAGNOSIS — R0602 Shortness of breath: Secondary | ICD-10-CM | POA: Diagnosis not present

## 2020-04-18 DIAGNOSIS — R42 Dizziness and giddiness: Secondary | ICD-10-CM | POA: Diagnosis not present

## 2020-04-18 DIAGNOSIS — I482 Chronic atrial fibrillation, unspecified: Secondary | ICD-10-CM

## 2020-04-18 DIAGNOSIS — I5181 Takotsubo syndrome: Secondary | ICD-10-CM | POA: Diagnosis present

## 2020-04-18 DIAGNOSIS — I110 Hypertensive heart disease with heart failure: Secondary | ICD-10-CM | POA: Diagnosis not present

## 2020-04-18 DIAGNOSIS — E119 Type 2 diabetes mellitus without complications: Secondary | ICD-10-CM | POA: Diagnosis not present

## 2020-04-18 DIAGNOSIS — R8290 Unspecified abnormal findings in urine: Secondary | ICD-10-CM | POA: Diagnosis not present

## 2020-04-18 DIAGNOSIS — R0789 Other chest pain: Secondary | ICD-10-CM | POA: Diagnosis not present

## 2020-04-18 DIAGNOSIS — R0609 Other forms of dyspnea: Secondary | ICD-10-CM | POA: Diagnosis not present

## 2020-04-18 DIAGNOSIS — I739 Peripheral vascular disease, unspecified: Secondary | ICD-10-CM | POA: Diagnosis present

## 2020-04-18 DIAGNOSIS — R079 Chest pain, unspecified: Secondary | ICD-10-CM | POA: Diagnosis not present

## 2020-04-18 DIAGNOSIS — I872 Venous insufficiency (chronic) (peripheral): Secondary | ICD-10-CM | POA: Diagnosis present

## 2020-04-18 DIAGNOSIS — G4733 Obstructive sleep apnea (adult) (pediatric): Secondary | ICD-10-CM | POA: Diagnosis present

## 2020-04-18 DIAGNOSIS — Z9981 Dependence on supplemental oxygen: Secondary | ICD-10-CM | POA: Diagnosis not present

## 2020-04-18 DIAGNOSIS — E785 Hyperlipidemia, unspecified: Secondary | ICD-10-CM | POA: Diagnosis present

## 2020-04-18 DIAGNOSIS — R748 Abnormal levels of other serum enzymes: Secondary | ICD-10-CM | POA: Diagnosis not present

## 2020-04-18 DIAGNOSIS — E1151 Type 2 diabetes mellitus with diabetic peripheral angiopathy without gangrene: Secondary | ICD-10-CM | POA: Diagnosis present

## 2020-04-18 DIAGNOSIS — I509 Heart failure, unspecified: Secondary | ICD-10-CM | POA: Diagnosis not present

## 2020-04-18 DIAGNOSIS — I25118 Atherosclerotic heart disease of native coronary artery with other forms of angina pectoris: Secondary | ICD-10-CM | POA: Diagnosis present

## 2020-04-18 DIAGNOSIS — R05 Cough: Secondary | ICD-10-CM | POA: Diagnosis not present

## 2020-04-18 DIAGNOSIS — Z8673 Personal history of transient ischemic attack (TIA), and cerebral infarction without residual deficits: Secondary | ICD-10-CM | POA: Diagnosis not present

## 2020-04-18 NOTE — Telephone Encounter (Signed)
Pt c/o medication issue:  1. Name of Medication: digoxin (LANOXIN) 0.125 MG tablet  2. How are you currently taking this medication (dosage and times per day)? Take 0.5 tablets (0.0625 mg total) by mouth daily.  3. Are you having a reaction (difficulty breathing--STAT)?   4. What is your medication issue? Pt said since she started this medication her chest feels heavy like there's congestion in it since friday, and when she cough, she cough up some mucus, she also said her BP this morning is 179/109. She denied CP but she said it really feels heavy

## 2020-04-18 NOTE — Telephone Encounter (Signed)
Spoke to patient . She states since Friday- she has been sj=hort of breath ,  Heaviness in her chest , and congestion .  Coughing up white mucous . She states she saw [place] PA last Monday - medication was changed  Decrease Isosorbide to 15 mg  And 0.5 tablet of digoxin .125 mg . Blood pressure today 179/109. She lives in [place] which is [place].  Talking with patient she sounds   Slightly short of breath. She is abe the talk in full sentence, Patient states she contacted  Primary. They are able to see her this afternoon, patient also stated her daughter is coming to take her to  [facility] ER. RN informed patient to go to the nearest ER to be evaluated. Patient voiced understanding . Patient wanted [place] PA to be aware.  note routed.

## 2020-04-21 ENCOUNTER — Telehealth: Payer: Self-pay | Admitting: Cardiovascular Disease

## 2020-04-21 NOTE — Telephone Encounter (Signed)
Rebekah Stewart is calling stating she is currently admitted into [facility]. She states the Doctor's are requesting her last Echo results to compare to the results she has now. Please advise.

## 2020-04-21 NOTE — Telephone Encounter (Signed)
Spoke to pt who report she is currently admitted at [facility] due to chest heaviness and SOB over the weekend. Pt report the attending physician is requesting most current ECHO faxed to the hospital. Nurse will ex-fax report to [phone] ATTN: Cyril Mourning, RN.

## 2020-04-27 ENCOUNTER — Ambulatory Visit (INDEPENDENT_AMBULATORY_CARE_PROVIDER_SITE_OTHER): Payer: Medicare Other | Admitting: Cardiology

## 2020-04-27 DIAGNOSIS — I482 Chronic atrial fibrillation, unspecified: Secondary | ICD-10-CM | POA: Diagnosis not present

## 2020-04-27 DIAGNOSIS — Z7901 Long term (current) use of anticoagulants: Secondary | ICD-10-CM | POA: Diagnosis not present

## 2020-04-27 LAB — POCT INR: INR: 1.5 — AB (ref 2.0–3.0)

## 2020-05-02 DIAGNOSIS — I2510 Atherosclerotic heart disease of native coronary artery without angina pectoris: Secondary | ICD-10-CM | POA: Diagnosis not present

## 2020-05-02 DIAGNOSIS — I4891 Unspecified atrial fibrillation: Secondary | ICD-10-CM | POA: Diagnosis not present

## 2020-05-02 DIAGNOSIS — G4733 Obstructive sleep apnea (adult) (pediatric): Secondary | ICD-10-CM | POA: Diagnosis not present

## 2020-05-02 DIAGNOSIS — J449 Chronic obstructive pulmonary disease, unspecified: Secondary | ICD-10-CM | POA: Diagnosis not present

## 2020-05-02 DIAGNOSIS — I5022 Chronic systolic (congestive) heart failure: Secondary | ICD-10-CM | POA: Diagnosis not present

## 2020-05-02 DIAGNOSIS — I10 Essential (primary) hypertension: Secondary | ICD-10-CM | POA: Diagnosis not present

## 2020-05-02 DIAGNOSIS — I872 Venous insufficiency (chronic) (peripheral): Secondary | ICD-10-CM | POA: Diagnosis not present

## 2020-05-04 DIAGNOSIS — Z79899 Other long term (current) drug therapy: Secondary | ICD-10-CM | POA: Diagnosis not present

## 2020-05-04 DIAGNOSIS — E1165 Type 2 diabetes mellitus with hyperglycemia: Secondary | ICD-10-CM | POA: Diagnosis not present

## 2020-05-04 DIAGNOSIS — I4891 Unspecified atrial fibrillation: Secondary | ICD-10-CM | POA: Diagnosis not present

## 2020-05-04 DIAGNOSIS — Z7901 Long term (current) use of anticoagulants: Secondary | ICD-10-CM | POA: Diagnosis not present

## 2020-05-04 DIAGNOSIS — E785 Hyperlipidemia, unspecified: Secondary | ICD-10-CM | POA: Diagnosis not present

## 2020-05-04 DIAGNOSIS — R778 Other specified abnormalities of plasma proteins: Secondary | ICD-10-CM | POA: Diagnosis not present

## 2020-05-04 DIAGNOSIS — I2510 Atherosclerotic heart disease of native coronary artery without angina pectoris: Secondary | ICD-10-CM | POA: Diagnosis not present

## 2020-05-04 DIAGNOSIS — E559 Vitamin D deficiency, unspecified: Secondary | ICD-10-CM | POA: Diagnosis not present

## 2020-05-04 DIAGNOSIS — I5033 Acute on chronic diastolic (congestive) heart failure: Secondary | ICD-10-CM | POA: Diagnosis not present

## 2020-05-04 DIAGNOSIS — E1142 Type 2 diabetes mellitus with diabetic polyneuropathy: Secondary | ICD-10-CM | POA: Diagnosis not present

## 2020-05-04 DIAGNOSIS — M10072 Idiopathic gout, left ankle and foot: Secondary | ICD-10-CM | POA: Diagnosis not present

## 2020-05-05 ENCOUNTER — Ambulatory Visit (INDEPENDENT_AMBULATORY_CARE_PROVIDER_SITE_OTHER): Payer: Medicare Other | Admitting: Pharmacist Clinician (PhC)/ Clinical Pharmacy Specialist

## 2020-05-05 DIAGNOSIS — I482 Chronic atrial fibrillation, unspecified: Secondary | ICD-10-CM

## 2020-05-05 DIAGNOSIS — Z7901 Long term (current) use of anticoagulants: Secondary | ICD-10-CM

## 2020-05-05 LAB — POCT INR: INR: 2.7 (ref 2.0–3.0)

## 2020-05-07 DIAGNOSIS — I5022 Chronic systolic (congestive) heart failure: Secondary | ICD-10-CM | POA: Diagnosis not present

## 2020-05-09 DIAGNOSIS — I4891 Unspecified atrial fibrillation: Secondary | ICD-10-CM | POA: Diagnosis not present

## 2020-05-09 DIAGNOSIS — I5022 Chronic systolic (congestive) heart failure: Secondary | ICD-10-CM | POA: Diagnosis not present

## 2020-05-13 LAB — POCT INR: INR: 4.8 — AB (ref 2.0–3.0)

## 2020-05-16 ENCOUNTER — Ambulatory Visit (INDEPENDENT_AMBULATORY_CARE_PROVIDER_SITE_OTHER): Payer: Medicare Other | Admitting: Internal Medicine

## 2020-05-16 DIAGNOSIS — I482 Chronic atrial fibrillation, unspecified: Secondary | ICD-10-CM

## 2020-05-16 DIAGNOSIS — Z7901 Long term (current) use of anticoagulants: Secondary | ICD-10-CM | POA: Diagnosis not present

## 2020-05-17 DIAGNOSIS — I4891 Unspecified atrial fibrillation: Secondary | ICD-10-CM | POA: Diagnosis not present

## 2020-05-17 DIAGNOSIS — G4733 Obstructive sleep apnea (adult) (pediatric): Secondary | ICD-10-CM | POA: Diagnosis not present

## 2020-05-17 DIAGNOSIS — I5022 Chronic systolic (congestive) heart failure: Secondary | ICD-10-CM | POA: Diagnosis not present

## 2020-05-17 DIAGNOSIS — I5181 Takotsubo syndrome: Secondary | ICD-10-CM | POA: Diagnosis not present

## 2020-05-18 ENCOUNTER — Ambulatory Visit (INDEPENDENT_AMBULATORY_CARE_PROVIDER_SITE_OTHER): Payer: Medicare Other | Admitting: Sports Medicine

## 2020-05-18 ENCOUNTER — Other Ambulatory Visit: Payer: Self-pay

## 2020-05-18 ENCOUNTER — Encounter: Payer: Self-pay | Admitting: Sports Medicine

## 2020-05-18 ENCOUNTER — Ambulatory Visit: Payer: Medicare Other | Admitting: Physician Assistant

## 2020-05-18 DIAGNOSIS — M79675 Pain in left toe(s): Secondary | ICD-10-CM | POA: Diagnosis not present

## 2020-05-18 DIAGNOSIS — M79674 Pain in right toe(s): Secondary | ICD-10-CM | POA: Diagnosis not present

## 2020-05-18 DIAGNOSIS — E1140 Type 2 diabetes mellitus with diabetic neuropathy, unspecified: Secondary | ICD-10-CM

## 2020-05-18 DIAGNOSIS — I739 Peripheral vascular disease, unspecified: Secondary | ICD-10-CM

## 2020-05-18 DIAGNOSIS — B351 Tinea unguium: Secondary | ICD-10-CM

## 2020-05-18 NOTE — Progress Notes (Signed)
Subjective: Rebekah Stewart is a 85 y.o. female patient with history of diabetes who returns to office today complaining of long, painful nails while ambulating in shoes; unable to trim.  Patient is also here for pickup of diabetic shoes.  Patient states that the glucose reading earlier this week was 171 has jumped up since she was in the hospital and has been discharged for congestive heart failure.  Patient denies any other pedal complaints.   Patient Active Problem List   Diagnosis Date Noted  . OSA (obstructive sleep apnea) 07/07/2019  . Respiratory failure with hypoxia ([place]) 10/10/2018  . Chronic anticoagulation 08/19/2018  . Non-insulin dependent type 2 diabetes mellitus ([place]) 08/13/2018  . Intertrigo 08/10/2018  . Pulmonary hypertension assoc with unclear multi-factorial mechanisms ([place])   . Acute on chronic diastolic heart failure ([place])   . Flash pulmonary edema ([place]) 08/09/2018  . Varicose veins of lower extremities with complications, bilateral 10/30/2016  . Dyslipidemia 05/21/2014  . Bilateral leg pain 11/26/2012  . Chronic diastolic heart failure ([place])   . Chronic atrial fibrillation ([place])   . Essential hypertension   . Coronary artery disease    Current Outpatient Medications on File Prior to Visit  Medication Sig Dispense Refill  . allopurinol (ZYLOPRIM) 300 MG tablet Take 1 tablet (300 mg total) by mouth daily as needed (for gout). 30 tablet 1  . atorvastatin (LIPITOR) 40 MG tablet TAKE 1 TABLET BY MOUTH EVERY DAY 30 tablet 1  . carvedilol (COREG) 25 MG tablet Take 1 tablet (25 mg total) by mouth 2 (two) times daily with a meal. 180 tablet 2  . Cholecalciferol (VITAMIN D) 2000 UNITS CAPS Take 1 capsule by mouth daily.    . digoxin (LANOXIN) 0.125 MG tablet Take 0.5 tablets (0.0625 mg total) by mouth daily. 30 tablet 0  . fish oil-omega-3 fatty acids 1000 MG capsule Take 2 g by mouth 2 (two) times daily.     . furosemide (LASIX) 20 MG tablet TAKE 1 TABLET BY MOUTH EVERY  DAY ALTERNATING 20MG & 40MG (Patient taking differently: Take 20 mg by mouth 2 (two) times daily. ) 45 tablet 0  . glucose blood (ACCU-CHEK AVIVA PLUS) test strip Check two times daily. Dx code E11.65 100 each 0  . isosorbide mononitrate (IMDUR) 30 MG 24 hr tablet Take 1 tablet (30 mg total) by mouth daily. Take 1/2 tablet 15 mg daily 90 tablet 3  . Lancets (ONETOUCH DELICA PLUS [id number]) MISC 1 each by Does not apply route 2 (two) times daily before a meal. Use to check blood sugars twilce daily before meal 100 each 0  . metFORMIN (GLUCOPHAGE) 500 MG tablet Take 1 tablet (500 mg total) by mouth 3 (three) times daily with meals. 90 tablet 1  . warfarin (COUMADIN) 5 MG tablet Take 5 mg by mouth daily.     No current facility-administered medications on file prior to visit.   Allergies  Allergen Reactions  . Bee Venom Shortness Of Breath and Swelling  . Iodinated Diagnostic Agents Itching  . Penicillins Swelling  . Tape     Pulls the skin    Recent Results (from the past 2160 hour(s))  POCT INR     Status: None   Collection Time: 02/19/20 12:00 AM  Result Value Ref Range   INR 3.0 2.0 - 3.0    Comment: self-test  POCT INR     Status: Abnormal   Collection Time: 02/26/20 12:00 AM  Result Value Ref Range  INR      Comment: self-test  POCT INR     Status: Abnormal   Collection Time: 03/02/20 12:00 AM  Result Value Ref Range   INR 3.6 (A) 2.0 - 3.0  POCT INR     Status: None   Collection Time: 03/11/20 12:00 AM  Result Value Ref Range   INR 2.5 2.0 - 3.0    Comment: self-test  POCT INR     Status: Abnormal   Collection Time: 03/18/20 12:00 AM  Result Value Ref Range   INR 1.6 (A) 2.0 - 3.0    Comment: self-test  Brain natriuretic peptide     Status: Abnormal   Collection Time: 03/18/20  3:33 PM  Result Value Ref Range   BNP 205.5 (H) 0.0 - 100.0 pg/mL  Basic metabolic panel     Status: Abnormal   Collection Time: 03/18/20  3:33 PM  Result Value Ref Range   Glucose 93 65  - 99 mg/dL   BUN 20 8 - 27 mg/dL   Creatinine, Ser 1.30 (H) 0.57 - 1.00 mg/dL   GFR calc non Af Amer 38 (L) >59 mL/min/1.73   GFR calc Af Amer 44 (L) >59 mL/min/1.73    Comment: **[facility] currently reports eGFR in compliance with the current**   recommendations of the [employer]. [facility] will   update reporting as new guidelines are published from the NKF-ASN   Task force.    BUN/Creatinine Ratio 15 12 - 28   Sodium 146 (H) 134 - 144 mmol/L   Potassium 4.2 3.5 - 5.2 mmol/L   Chloride 103 96 - 106 mmol/L   CO2 22 20 - 29 mmol/L   Calcium 8.9 8.7 - 10.3 mg/dL  POCT INR     Status: None   Collection Time: 03/25/20 12:00 AM  Result Value Ref Range   INR 2.2 2.0 - 3.0  POCT INR     Status: None   Collection Time: 04/05/20 12:00 AM  Result Value Ref Range   INR 2.9 2.0 - 3.0  POCT INR     Status: None   Collection Time: 04/10/20 12:00 AM  Result Value Ref Range   INR 2.9 2.0 - 3.0  POCT INR     Status: None   Collection Time: 04/16/20 12:00 AM  Result Value Ref Range   INR 2.4 2.0 - 3.0  POCT INR     Status: Abnormal   Collection Time: 04/27/20 12:00 AM  Result Value Ref Range   INR 1.5 (A) 2.0 - 3.0  POCT INR     Status: None   Collection Time: 05/05/20 12:00 AM  Result Value Ref Range   INR 2.7 2.0 - 3.0  POCT INR     Status: Abnormal   Collection Time: 05/13/20 12:00 AM  Result Value Ref Range   INR 4.8 (A) 2.0 - 3.0    Objective: General: Patient is awake, alert, and oriented x 3 and in no acute distress.  Integument: Skin is warm, dry and supple bilateral. Nails are tender, long, thickened and dystrophic with subungual debris, consistent with onychomycosis, 1-5 bilateral.  No acute signs of infection. No open lesions or preulcerative lesions present bilateral. Remaining integument unremarkable.  Vasculature:  Dorsalis Pedis pulse 1/4 bilateral. Posterior Tibial pulse  0/4 bilateral. Capillary fill time <5 sec 1-5 bilateral. Diminished hair growth to  the level of the digits.Temperature gradient within normal limits.  Moderate varicosities and venous stasis hyperpigmentation with trace edema bilateral at  ankles.   Neurology: The patient has diminished sensation measured with a 5.07/10g Semmes Weinstein Monofilament at all pedal sites bilateral. Vibratory sensation diminished bilateral with tuning fork. No Babinski sign present bilateral.   Musculoskeletal: Asymptomatic pes planus, bunion and hammertoe. Muscular strength 5/5 in all lower extremity muscular groups bilateral without pain on range of motion. No tenderness with calf compression bilateral.  Assessment and Plan: Problem List Items Addressed This Visit    None    Visit Diagnoses    Pain due to onychomycosis of toenails of both feet    -  Primary   Type 2 diabetes, controlled, with neuropathy (HCC)       PVD (peripheral vascular disease) ([place])         -Examined patient. -Re-discussed and educated patient on diabetic foot care and importance of daily foot inspection in setting of diabetes -Mechanically debrided all nails 1-5 bilateral using sterile nail nipper and filed with dremel without incident  -Patient desires new shoes for this year since previous ones were for 2020; patient to see Betha for diabetic shoe measurements -Patient to return  in 3 months for at risk nail care -Patient advised to call the office if any problems or questions arise in the meantime.  Landis Martins, DPM

## 2020-05-20 LAB — POCT INR: INR: 2.4 (ref 2.0–3.0)

## 2020-05-23 ENCOUNTER — Ambulatory Visit (INDEPENDENT_AMBULATORY_CARE_PROVIDER_SITE_OTHER): Payer: Medicare Other | Admitting: Cardiology

## 2020-05-23 DIAGNOSIS — I482 Chronic atrial fibrillation, unspecified: Secondary | ICD-10-CM | POA: Diagnosis not present

## 2020-05-23 DIAGNOSIS — Z7901 Long term (current) use of anticoagulants: Secondary | ICD-10-CM | POA: Diagnosis not present

## 2020-05-23 NOTE — Patient Instructions (Signed)
Description   Continue taking 37m daily except for 2.573mevery Monday, Wednesday, and Friday. Recheck INR in 1 week - self tester.

## 2020-05-27 DIAGNOSIS — I5181 Takotsubo syndrome: Secondary | ICD-10-CM | POA: Diagnosis not present

## 2020-05-27 DIAGNOSIS — I5022 Chronic systolic (congestive) heart failure: Secondary | ICD-10-CM | POA: Diagnosis not present

## 2020-05-27 DIAGNOSIS — I4891 Unspecified atrial fibrillation: Secondary | ICD-10-CM | POA: Diagnosis not present

## 2020-05-28 LAB — POCT INR: INR: 2.9 (ref 2.0–3.0)

## 2020-05-30 ENCOUNTER — Ambulatory Visit (INDEPENDENT_AMBULATORY_CARE_PROVIDER_SITE_OTHER): Payer: Medicare Other | Admitting: Cardiology

## 2020-05-30 DIAGNOSIS — Z7901 Long term (current) use of anticoagulants: Secondary | ICD-10-CM | POA: Diagnosis not present

## 2020-05-30 DIAGNOSIS — I482 Chronic atrial fibrillation, unspecified: Secondary | ICD-10-CM

## 2020-05-31 DIAGNOSIS — I5022 Chronic systolic (congestive) heart failure: Secondary | ICD-10-CM | POA: Diagnosis not present

## 2020-06-03 ENCOUNTER — Ambulatory Visit (INDEPENDENT_AMBULATORY_CARE_PROVIDER_SITE_OTHER): Payer: Medicare Other | Admitting: Pharmacist Clinician (PhC)/ Clinical Pharmacy Specialist

## 2020-06-03 DIAGNOSIS — I482 Chronic atrial fibrillation, unspecified: Secondary | ICD-10-CM

## 2020-06-03 DIAGNOSIS — Z7901 Long term (current) use of anticoagulants: Secondary | ICD-10-CM

## 2020-06-03 LAB — POCT INR: INR: 2.1 (ref 2.0–3.0)

## 2020-06-04 ENCOUNTER — Other Ambulatory Visit: Payer: Self-pay | Admitting: Physician Assistant

## 2020-06-07 MED ORDER — DIGOXIN 125 MCG PO TABS: 0.0625 mg | ORAL_TABLET | Freq: Every day | ORAL | 0 refills | Status: DC

## 2020-06-07 NOTE — Addendum Note (Signed)
Addended by: Jacqulynn Cadet on: 06/07/2020 06:24 PM   Modules accepted: Orders

## 2020-06-10 ENCOUNTER — Telehealth: Payer: Self-pay | Admitting: Emergency Medicine

## 2020-06-10 ENCOUNTER — Ambulatory Visit (INDEPENDENT_AMBULATORY_CARE_PROVIDER_SITE_OTHER): Payer: Medicare Other | Admitting: Cardiology

## 2020-06-10 DIAGNOSIS — I482 Chronic atrial fibrillation, unspecified: Secondary | ICD-10-CM

## 2020-06-10 DIAGNOSIS — Z7901 Long term (current) use of anticoagulants: Secondary | ICD-10-CM

## 2020-06-10 DIAGNOSIS — Z5181 Encounter for therapeutic drug level monitoring: Secondary | ICD-10-CM

## 2020-06-10 LAB — POCT INR: INR: 2.1 (ref 2.0–3.0)

## 2020-06-10 NOTE — Telephone Encounter (Signed)
Contacted patient and [employer]. Ace Gins is working on putting an extension on Ms. Ducksworth's qualifying walk requirement. Ashly is going to call us back. He is going to try and delay pickup of equipment based on her health and risk of Covid. [employer] had been calling the patient to pick up her oxygen devices.

## 2020-06-10 NOTE — Telephone Encounter (Signed)
Spoke with Ashly from [place] [facility] he stated that patient called one day and stated that she didn't need the O2 anymore. Her PCP Dr. Evette Doffing sent in DC order and then when they called her to make arrangements to pick up oxygen patient stated that she did need it. Ashly stated that they can put a hold on the pick up but patient need to have an OV or it can even be a Televisit with our office and we just need to make sure that OV notes state that she needs the oxygen and that she is benefiting from it. They will then send over CMN to get signed. As of right now patient does not need to come into office and do a walk. Once this is done she will be good for 5 years.   Called and spoke with patient to let her know that she needs to have a Televisit with our office in order for her to keep her oxygen. Patient states that she wears 2 liters at night. Patient is now scheduled for Televisit on 9/20 at 4:30.  Will forward to Dr. Lamonte Sakai as an Juluis Rainier

## 2020-06-10 NOTE — Telephone Encounter (Signed)
Thank you

## 2020-06-14 ENCOUNTER — Other Ambulatory Visit: Payer: Medicare Other

## 2020-06-19 LAB — POCT INR: INR: 3.4 — AB (ref 2.0–3.0)

## 2020-06-20 ENCOUNTER — Encounter: Payer: Self-pay | Admitting: Emergency Medicine

## 2020-06-20 ENCOUNTER — Ambulatory Visit (INDEPENDENT_AMBULATORY_CARE_PROVIDER_SITE_OTHER): Payer: Medicare Other | Admitting: Internal Medicine

## 2020-06-20 ENCOUNTER — Other Ambulatory Visit: Payer: Self-pay

## 2020-06-20 ENCOUNTER — Ambulatory Visit (INDEPENDENT_AMBULATORY_CARE_PROVIDER_SITE_OTHER): Payer: Medicare Other | Admitting: Emergency Medicine

## 2020-06-20 ENCOUNTER — Telehealth: Payer: Self-pay | Admitting: Cardiovascular Disease

## 2020-06-20 DIAGNOSIS — I482 Chronic atrial fibrillation, unspecified: Secondary | ICD-10-CM | POA: Diagnosis not present

## 2020-06-20 DIAGNOSIS — J9611 Chronic respiratory failure with hypoxia: Secondary | ICD-10-CM | POA: Diagnosis not present

## 2020-06-20 DIAGNOSIS — Z7901 Long term (current) use of anticoagulants: Secondary | ICD-10-CM | POA: Diagnosis not present

## 2020-06-20 NOTE — Telephone Encounter (Signed)
Spoke with patient to discuss scheduling 6 month follow up with Dr. Fletcher Anon (due September 2021)---patient states she was in the hospital in [place], [place] in July 2021 and she is still under the care of those physicians.  She states she will call back at a later date to schedule.

## 2020-06-20 NOTE — Progress Notes (Signed)
Virtual Visit via Telephone Note  I connected with Rebekah Stewart on 06/20/20 at  4:30 PM EDT by telephone and verified that I am speaking with the correct person using two identifiers.  Location: Patient: home  Provider: office    I discussed the limitations, risks, security and privacy concerns of performing an evaluation and management service by telephone and the availability of in person appointments. I also discussed with the patient that there may be a patient responsible charge related to this service. The patient expressed understanding and agreed to proceed.   History of Present Illness: 85 year old woman, never smoker with CAD, hypertension, A. fib with diastolic dysfunction and multifactorial secondary pulmonary hypertension.  She has documented nocturnal hypoxemia by polysomnogram and also overnight oximetry.  She actually has obstructive sleep apnea but has not been interested in starting CPAP.  She does use nocturnal oxygen at 2 L/min   Observations/Objective: She is feeling well at this time. Was hospitalized in [place] in July. She underwent L cath and had 4v CAD but no stenting was needed. Started entresto, carvedilol, restarted spironolactone . She is wearing o2 2L/min at night. She is reliable with her nocturnal O2. She believes that she is benefiting, feels that she is breathing and sleeping better.   Assessment and Plan:  Multifactorial chronic respiratory failure with hypoxemia, nocturnal desaturations documented on PSG and ONO. Contributors include her CHF, OSA.  Plan to continue nocturnal oxygen 2 L/min.  She is not interested in pursuing CPAP.  This note and visit will document that she is compliant, is benefiting clinically.  Follow Up Instructions: As needed    I discussed the assessment and treatment plan with the patient. The patient was provided an opportunity to ask questions and all were answered. The patient agreed with the plan and demonstrated an  understanding of the instructions.   The patient was advised to call back or seek an in-person evaluation if the symptoms worsen or if the condition fails to improve as anticipated.  I provided 16 minutes of non-face-to-face time during this encounter.   Collene Gobble, MD

## 2020-06-24 ENCOUNTER — Other Ambulatory Visit: Payer: Medicare Other

## 2020-06-27 ENCOUNTER — Ambulatory Visit (INDEPENDENT_AMBULATORY_CARE_PROVIDER_SITE_OTHER): Payer: Medicare Other | Admitting: Internal Medicine

## 2020-06-27 DIAGNOSIS — Z7901 Long term (current) use of anticoagulants: Secondary | ICD-10-CM

## 2020-06-27 DIAGNOSIS — I482 Chronic atrial fibrillation, unspecified: Secondary | ICD-10-CM

## 2020-06-27 LAB — POCT INR: INR: 2 (ref 2.0–3.0)

## 2020-07-02 LAB — POCT INR: INR: 2.7 (ref 2.0–3.0)

## 2020-07-04 ENCOUNTER — Ambulatory Visit (INDEPENDENT_AMBULATORY_CARE_PROVIDER_SITE_OTHER): Payer: Medicare Other | Admitting: Cardiology

## 2020-07-04 DIAGNOSIS — I482 Chronic atrial fibrillation, unspecified: Secondary | ICD-10-CM | POA: Diagnosis not present

## 2020-07-04 DIAGNOSIS — Z7901 Long term (current) use of anticoagulants: Secondary | ICD-10-CM

## 2020-07-05 ENCOUNTER — Telehealth: Payer: Self-pay | Admitting: Cardiovascular Disease

## 2020-07-05 NOTE — Telephone Encounter (Signed)
Spoke with patient and they stated needed potassium checked and still following up with cardiologist in [place], will call to make appointment once released from care with provider in [place] since discharged from hospital

## 2020-07-06 DIAGNOSIS — I5032 Chronic diastolic (congestive) heart failure: Secondary | ICD-10-CM | POA: Diagnosis not present

## 2020-07-09 DIAGNOSIS — Z7901 Long term (current) use of anticoagulants: Secondary | ICD-10-CM | POA: Diagnosis not present

## 2020-07-09 LAB — POCT INR: INR: 1.4 — AB (ref 2.0–3.0)

## 2020-07-11 ENCOUNTER — Ambulatory Visit (INDEPENDENT_AMBULATORY_CARE_PROVIDER_SITE_OTHER): Payer: Medicare Other | Admitting: Cardiology

## 2020-07-11 DIAGNOSIS — Z7901 Long term (current) use of anticoagulants: Secondary | ICD-10-CM

## 2020-07-11 DIAGNOSIS — I482 Chronic atrial fibrillation, unspecified: Secondary | ICD-10-CM

## 2020-07-13 ENCOUNTER — Other Ambulatory Visit: Payer: Self-pay

## 2020-07-13 ENCOUNTER — Ambulatory Visit (INDEPENDENT_AMBULATORY_CARE_PROVIDER_SITE_OTHER): Payer: Medicare Other | Admitting: Sports Medicine

## 2020-07-13 DIAGNOSIS — M2042 Other hammer toe(s) (acquired), left foot: Secondary | ICD-10-CM

## 2020-07-13 DIAGNOSIS — I739 Peripheral vascular disease, unspecified: Secondary | ICD-10-CM

## 2020-07-13 DIAGNOSIS — M2010 Hallux valgus (acquired), unspecified foot: Secondary | ICD-10-CM

## 2020-07-13 DIAGNOSIS — M2041 Other hammer toe(s) (acquired), right foot: Secondary | ICD-10-CM

## 2020-07-13 DIAGNOSIS — M2140 Flat foot [pes planus] (acquired), unspecified foot: Secondary | ICD-10-CM

## 2020-07-13 DIAGNOSIS — E1140 Type 2 diabetes mellitus with diabetic neuropathy, unspecified: Secondary | ICD-10-CM

## 2020-07-13 NOTE — Progress Notes (Signed)
Patient met with CMA and was measured for diabetic shoes and insoles. Patient to follow up in office for dispensing when called. -Dr. Chauncey Cruel

## 2020-07-16 LAB — POCT INR: INR: 2.2 (ref 2.0–3.0)

## 2020-07-18 ENCOUNTER — Ambulatory Visit (INDEPENDENT_AMBULATORY_CARE_PROVIDER_SITE_OTHER): Payer: Medicare Other | Admitting: Cardiovascular Disease

## 2020-07-18 DIAGNOSIS — Z7901 Long term (current) use of anticoagulants: Secondary | ICD-10-CM

## 2020-07-18 DIAGNOSIS — I482 Chronic atrial fibrillation, unspecified: Secondary | ICD-10-CM

## 2020-07-23 LAB — POCT INR: INR: 1.5 — AB (ref 2.0–3.0)

## 2020-07-25 ENCOUNTER — Ambulatory Visit (INDEPENDENT_AMBULATORY_CARE_PROVIDER_SITE_OTHER): Payer: Medicare Other | Admitting: Cardiology

## 2020-07-25 DIAGNOSIS — I482 Chronic atrial fibrillation, unspecified: Secondary | ICD-10-CM

## 2020-07-25 DIAGNOSIS — Z7901 Long term (current) use of anticoagulants: Secondary | ICD-10-CM

## 2020-07-28 DIAGNOSIS — I340 Nonrheumatic mitral (valve) insufficiency: Secondary | ICD-10-CM | POA: Diagnosis not present

## 2020-07-28 DIAGNOSIS — I4891 Unspecified atrial fibrillation: Secondary | ICD-10-CM | POA: Diagnosis not present

## 2020-07-28 DIAGNOSIS — I358 Other nonrheumatic aortic valve disorders: Secondary | ICD-10-CM | POA: Diagnosis not present

## 2020-07-28 DIAGNOSIS — I509 Heart failure, unspecified: Secondary | ICD-10-CM | POA: Diagnosis not present

## 2020-07-28 DIAGNOSIS — I110 Hypertensive heart disease with heart failure: Secondary | ICD-10-CM | POA: Diagnosis not present

## 2020-07-28 DIAGNOSIS — I501 Left ventricular failure: Secondary | ICD-10-CM | POA: Diagnosis not present

## 2020-07-28 DIAGNOSIS — I348 Other nonrheumatic mitral valve disorders: Secondary | ICD-10-CM | POA: Diagnosis not present

## 2020-07-28 DIAGNOSIS — E785 Hyperlipidemia, unspecified: Secondary | ICD-10-CM | POA: Diagnosis not present

## 2020-07-28 DIAGNOSIS — I517 Cardiomegaly: Secondary | ICD-10-CM | POA: Diagnosis not present

## 2020-07-28 DIAGNOSIS — E1151 Type 2 diabetes mellitus with diabetic peripheral angiopathy without gangrene: Secondary | ICD-10-CM | POA: Diagnosis not present

## 2020-07-28 DIAGNOSIS — I361 Nonrheumatic tricuspid (valve) insufficiency: Secondary | ICD-10-CM | POA: Diagnosis not present

## 2020-07-30 LAB — POCT INR: INR: 1.1 — AB (ref 2.0–3.0)

## 2020-08-01 ENCOUNTER — Ambulatory Visit (INDEPENDENT_AMBULATORY_CARE_PROVIDER_SITE_OTHER): Payer: Medicare Other | Admitting: Cardiology

## 2020-08-01 DIAGNOSIS — I5022 Chronic systolic (congestive) heart failure: Secondary | ICD-10-CM | POA: Diagnosis not present

## 2020-08-01 DIAGNOSIS — I482 Chronic atrial fibrillation, unspecified: Secondary | ICD-10-CM | POA: Diagnosis not present

## 2020-08-01 DIAGNOSIS — Z7901 Long term (current) use of anticoagulants: Secondary | ICD-10-CM | POA: Diagnosis not present

## 2020-08-06 DIAGNOSIS — Z7901 Long term (current) use of anticoagulants: Secondary | ICD-10-CM | POA: Diagnosis not present

## 2020-08-06 LAB — POCT INR: INR: 2.6 (ref 2.0–3.0)

## 2020-08-08 ENCOUNTER — Ambulatory Visit (INDEPENDENT_AMBULATORY_CARE_PROVIDER_SITE_OTHER): Payer: Medicare Other | Admitting: Cardiology

## 2020-08-08 DIAGNOSIS — I482 Chronic atrial fibrillation, unspecified: Secondary | ICD-10-CM | POA: Diagnosis not present

## 2020-08-08 DIAGNOSIS — Z7901 Long term (current) use of anticoagulants: Secondary | ICD-10-CM | POA: Diagnosis not present

## 2020-08-09 DIAGNOSIS — G4733 Obstructive sleep apnea (adult) (pediatric): Secondary | ICD-10-CM | POA: Diagnosis not present

## 2020-08-09 DIAGNOSIS — I4891 Unspecified atrial fibrillation: Secondary | ICD-10-CM | POA: Diagnosis not present

## 2020-08-09 DIAGNOSIS — I110 Hypertensive heart disease with heart failure: Secondary | ICD-10-CM | POA: Diagnosis not present

## 2020-08-09 DIAGNOSIS — I5022 Chronic systolic (congestive) heart failure: Secondary | ICD-10-CM | POA: Diagnosis not present

## 2020-08-11 ENCOUNTER — Telehealth: Payer: Self-pay | Admitting: Cardiovascular Disease

## 2020-08-11 ENCOUNTER — Encounter: Payer: Self-pay | Admitting: General Practice

## 2020-08-11 NOTE — Telephone Encounter (Signed)
  Recall expunge letter sent for overdue 6 mo f/u

## 2020-08-14 LAB — POCT INR: INR: 2.2 (ref 2.0–3.0)

## 2020-08-15 ENCOUNTER — Ambulatory Visit (INDEPENDENT_AMBULATORY_CARE_PROVIDER_SITE_OTHER): Payer: Medicare Other | Admitting: Internal Medicine

## 2020-08-15 DIAGNOSIS — Z7901 Long term (current) use of anticoagulants: Secondary | ICD-10-CM

## 2020-08-15 DIAGNOSIS — I482 Chronic atrial fibrillation, unspecified: Secondary | ICD-10-CM | POA: Diagnosis not present

## 2020-08-19 ENCOUNTER — Ambulatory Visit: Payer: Medicare Other | Admitting: Sports Medicine

## 2020-08-20 LAB — POCT INR: INR: 2.1 (ref 2.0–3.0)

## 2020-08-22 ENCOUNTER — Ambulatory Visit (INDEPENDENT_AMBULATORY_CARE_PROVIDER_SITE_OTHER): Payer: Medicare Other | Admitting: Internal Medicine

## 2020-08-22 DIAGNOSIS — Z7901 Long term (current) use of anticoagulants: Secondary | ICD-10-CM

## 2020-08-22 DIAGNOSIS — I482 Chronic atrial fibrillation, unspecified: Secondary | ICD-10-CM | POA: Diagnosis not present

## 2020-08-24 DIAGNOSIS — Z7901 Long term (current) use of anticoagulants: Secondary | ICD-10-CM | POA: Diagnosis not present

## 2020-08-24 DIAGNOSIS — E1165 Type 2 diabetes mellitus with hyperglycemia: Secondary | ICD-10-CM | POA: Diagnosis not present

## 2020-08-24 DIAGNOSIS — E1142 Type 2 diabetes mellitus with diabetic polyneuropathy: Secondary | ICD-10-CM | POA: Diagnosis not present

## 2020-08-24 DIAGNOSIS — I5032 Chronic diastolic (congestive) heart failure: Secondary | ICD-10-CM | POA: Diagnosis not present

## 2020-08-24 DIAGNOSIS — I4891 Unspecified atrial fibrillation: Secondary | ICD-10-CM | POA: Diagnosis not present

## 2020-08-24 DIAGNOSIS — I2510 Atherosclerotic heart disease of native coronary artery without angina pectoris: Secondary | ICD-10-CM | POA: Diagnosis not present

## 2020-08-24 DIAGNOSIS — Z1331 Encounter for screening for depression: Secondary | ICD-10-CM | POA: Diagnosis not present

## 2020-08-24 DIAGNOSIS — E785 Hyperlipidemia, unspecified: Secondary | ICD-10-CM | POA: Diagnosis not present

## 2020-08-24 DIAGNOSIS — Z23 Encounter for immunization: Secondary | ICD-10-CM | POA: Diagnosis not present

## 2020-08-24 DIAGNOSIS — M10072 Idiopathic gout, left ankle and foot: Secondary | ICD-10-CM | POA: Diagnosis not present

## 2020-08-24 DIAGNOSIS — E559 Vitamin D deficiency, unspecified: Secondary | ICD-10-CM | POA: Diagnosis not present

## 2020-08-27 LAB — POCT INR: INR: 1.8 — AB (ref 2.0–3.0)

## 2020-08-29 ENCOUNTER — Ambulatory Visit (INDEPENDENT_AMBULATORY_CARE_PROVIDER_SITE_OTHER): Payer: Medicare Other | Admitting: Cardiology

## 2020-08-29 DIAGNOSIS — Z7901 Long term (current) use of anticoagulants: Secondary | ICD-10-CM | POA: Diagnosis not present

## 2020-08-29 DIAGNOSIS — I482 Chronic atrial fibrillation, unspecified: Secondary | ICD-10-CM | POA: Diagnosis not present

## 2020-09-04 LAB — POCT INR: INR: 2.4 (ref 2.0–3.0)

## 2020-09-05 ENCOUNTER — Ambulatory Visit (INDEPENDENT_AMBULATORY_CARE_PROVIDER_SITE_OTHER): Payer: Medicare Other | Admitting: Cardiology

## 2020-09-05 DIAGNOSIS — Z5181 Encounter for therapeutic drug level monitoring: Secondary | ICD-10-CM | POA: Diagnosis not present

## 2020-09-05 DIAGNOSIS — I482 Chronic atrial fibrillation, unspecified: Secondary | ICD-10-CM

## 2020-09-05 DIAGNOSIS — Z7901 Long term (current) use of anticoagulants: Secondary | ICD-10-CM

## 2020-09-12 ENCOUNTER — Ambulatory Visit (INDEPENDENT_AMBULATORY_CARE_PROVIDER_SITE_OTHER): Payer: Medicare Other | Admitting: Cardiology

## 2020-09-12 DIAGNOSIS — Z5181 Encounter for therapeutic drug level monitoring: Secondary | ICD-10-CM | POA: Diagnosis not present

## 2020-09-12 DIAGNOSIS — I482 Chronic atrial fibrillation, unspecified: Secondary | ICD-10-CM

## 2020-09-12 DIAGNOSIS — Z7901 Long term (current) use of anticoagulants: Secondary | ICD-10-CM

## 2020-09-12 LAB — POCT INR: INR: 2 (ref 2.0–3.0)

## 2020-09-15 ENCOUNTER — Ambulatory Visit (INDEPENDENT_AMBULATORY_CARE_PROVIDER_SITE_OTHER): Payer: Medicare Other | Admitting: Orthotics

## 2020-09-15 ENCOUNTER — Other Ambulatory Visit: Payer: Self-pay

## 2020-09-15 DIAGNOSIS — E1140 Type 2 diabetes mellitus with diabetic neuropathy, unspecified: Secondary | ICD-10-CM

## 2020-09-26 ENCOUNTER — Ambulatory Visit (INDEPENDENT_AMBULATORY_CARE_PROVIDER_SITE_OTHER): Payer: Medicare Other | Admitting: Cardiovascular Disease

## 2020-09-26 DIAGNOSIS — Z7901 Long term (current) use of anticoagulants: Secondary | ICD-10-CM

## 2020-09-26 DIAGNOSIS — I482 Chronic atrial fibrillation, unspecified: Secondary | ICD-10-CM | POA: Diagnosis not present

## 2020-09-26 LAB — POCT INR: INR: 2.1 (ref 2.0–3.0)

## 2020-10-03 LAB — POCT INR: INR: 1.4 — AB (ref 2.0–3.0)

## 2020-10-04 ENCOUNTER — Ambulatory Visit (INDEPENDENT_AMBULATORY_CARE_PROVIDER_SITE_OTHER): Payer: Medicare Other | Admitting: Pharmacist Clinician (PhC)/ Clinical Pharmacy Specialist

## 2020-10-04 DIAGNOSIS — I482 Chronic atrial fibrillation, unspecified: Secondary | ICD-10-CM | POA: Diagnosis not present

## 2020-10-04 DIAGNOSIS — Z7901 Long term (current) use of anticoagulants: Secondary | ICD-10-CM | POA: Diagnosis not present

## 2020-10-10 ENCOUNTER — Ambulatory Visit (INDEPENDENT_AMBULATORY_CARE_PROVIDER_SITE_OTHER): Payer: Medicare Other | Admitting: Cardiology

## 2020-10-10 DIAGNOSIS — Z5181 Encounter for therapeutic drug level monitoring: Secondary | ICD-10-CM

## 2020-10-10 DIAGNOSIS — I482 Chronic atrial fibrillation, unspecified: Secondary | ICD-10-CM

## 2020-10-10 DIAGNOSIS — Z7901 Long term (current) use of anticoagulants: Secondary | ICD-10-CM

## 2020-10-10 LAB — POCT INR: INR: 2 (ref 2.0–3.0)

## 2020-10-11 ENCOUNTER — Ambulatory Visit: Payer: Medicare Other | Admitting: Sports Medicine

## 2020-10-11 DIAGNOSIS — Z9181 History of falling: Secondary | ICD-10-CM | POA: Diagnosis not present

## 2020-10-11 DIAGNOSIS — E669 Obesity, unspecified: Secondary | ICD-10-CM | POA: Diagnosis not present

## 2020-10-11 DIAGNOSIS — E785 Hyperlipidemia, unspecified: Secondary | ICD-10-CM | POA: Diagnosis not present

## 2020-10-11 DIAGNOSIS — Z0000 Encounter for general adult medical examination without abnormal findings: Secondary | ICD-10-CM | POA: Diagnosis not present

## 2020-10-11 DIAGNOSIS — Z1331 Encounter for screening for depression: Secondary | ICD-10-CM | POA: Diagnosis not present

## 2020-10-18 ENCOUNTER — Ambulatory Visit (INDEPENDENT_AMBULATORY_CARE_PROVIDER_SITE_OTHER): Payer: Medicare Other | Admitting: Pharmacist

## 2020-10-18 DIAGNOSIS — I482 Chronic atrial fibrillation, unspecified: Secondary | ICD-10-CM

## 2020-10-18 DIAGNOSIS — Z7901 Long term (current) use of anticoagulants: Secondary | ICD-10-CM | POA: Diagnosis not present

## 2020-10-18 LAB — POCT INR: INR: 2.2 (ref 2.0–3.0)

## 2020-10-26 ENCOUNTER — Ambulatory Visit (INDEPENDENT_AMBULATORY_CARE_PROVIDER_SITE_OTHER): Payer: Medicare Other | Admitting: Cardiovascular Disease

## 2020-10-26 DIAGNOSIS — Z7901 Long term (current) use of anticoagulants: Secondary | ICD-10-CM

## 2020-10-26 DIAGNOSIS — I482 Chronic atrial fibrillation, unspecified: Secondary | ICD-10-CM

## 2020-10-26 DIAGNOSIS — Z5181 Encounter for therapeutic drug level monitoring: Secondary | ICD-10-CM

## 2020-10-26 LAB — POCT INR: INR: 2.3 (ref 2.0–3.0)

## 2020-11-03 LAB — POCT INR: INR: 2.7 (ref 2.0–3.0)

## 2020-11-04 ENCOUNTER — Ambulatory Visit (INDEPENDENT_AMBULATORY_CARE_PROVIDER_SITE_OTHER): Payer: Medicare Other | Admitting: Internal Medicine

## 2020-11-04 DIAGNOSIS — I482 Chronic atrial fibrillation, unspecified: Secondary | ICD-10-CM | POA: Diagnosis not present

## 2020-11-04 DIAGNOSIS — Z7901 Long term (current) use of anticoagulants: Secondary | ICD-10-CM

## 2020-11-04 DIAGNOSIS — Z5181 Encounter for therapeutic drug level monitoring: Secondary | ICD-10-CM

## 2020-11-09 DIAGNOSIS — Z7901 Long term (current) use of anticoagulants: Secondary | ICD-10-CM | POA: Diagnosis not present

## 2020-11-09 LAB — POCT INR: INR: 2.1 (ref 2.0–3.0)

## 2020-11-10 ENCOUNTER — Ambulatory Visit (INDEPENDENT_AMBULATORY_CARE_PROVIDER_SITE_OTHER): Payer: Medicare Other | Admitting: Cardiovascular Disease

## 2020-11-10 DIAGNOSIS — Z7901 Long term (current) use of anticoagulants: Secondary | ICD-10-CM

## 2020-11-10 DIAGNOSIS — I482 Chronic atrial fibrillation, unspecified: Secondary | ICD-10-CM

## 2020-11-15 ENCOUNTER — Ambulatory Visit (INDEPENDENT_AMBULATORY_CARE_PROVIDER_SITE_OTHER): Payer: Medicare Other | Admitting: Cardiovascular Disease

## 2020-11-15 ENCOUNTER — Encounter: Payer: Self-pay | Admitting: Cardiovascular Disease

## 2020-11-15 ENCOUNTER — Other Ambulatory Visit: Payer: Self-pay

## 2020-11-15 VITALS — BP 144/78 | HR 98 | Ht 64.0 in | Wt 193.2 lb

## 2020-11-15 DIAGNOSIS — E785 Hyperlipidemia, unspecified: Secondary | ICD-10-CM

## 2020-11-15 DIAGNOSIS — I872 Venous insufficiency (chronic) (peripheral): Secondary | ICD-10-CM

## 2020-11-15 DIAGNOSIS — I482 Chronic atrial fibrillation, unspecified: Secondary | ICD-10-CM | POA: Diagnosis not present

## 2020-11-15 DIAGNOSIS — I5032 Chronic diastolic (congestive) heart failure: Secondary | ICD-10-CM

## 2020-11-15 DIAGNOSIS — I10 Essential (primary) hypertension: Secondary | ICD-10-CM

## 2020-11-15 DIAGNOSIS — I2510 Atherosclerotic heart disease of native coronary artery without angina pectoris: Secondary | ICD-10-CM | POA: Diagnosis not present

## 2020-11-15 DIAGNOSIS — I5022 Chronic systolic (congestive) heart failure: Secondary | ICD-10-CM

## 2020-11-15 NOTE — Patient Instructions (Signed)

## 2020-11-15 NOTE — Progress Notes (Signed)
Cardiology Office Note   Date:  11/17/2020   ID:  Genevive Printup [place], [place] 02/03/1935, MRN [id number]  PCP:  Nicoletta Dress, MD  Cardiologist:   Kathlyn Sacramento, MD   No chief complaint on file.     History of Present Illness: Rebekah Stewart is a 86 y.o. female who presents for  a followup visit regarding chronic atrial fibrillation, refractory hypertension and chronic diastolic heart failure. She had a cardiac catheterization done in 2010 which showed mild to moderate three-vessel nonobstructive coronary artery disease. Worst stenosis was in the mid LAD which was estimated to be 50%. She has multiple chronic medical conditions including Diabetes and hyperlipidemia. She is known to have chronic bilateral leg edema  with stasis dermatitis.  She has secondary lymphedema due to chronic venous insufficiency. Lower extremity arterial Doppler in July showed normal ABI bilaterally. The patient could not afford Eliquis due to cost and she is back on warfarin.    She had previous venous ulceration of the lower extremities.  She was treated with a lymphedema pump with subsequent improvement.  Blood pressure improved significantly with addition of spironolactone.   She had an echocardiogram done in our office last year which showed normal LV systolic function.  She was hospitalized in July at [facility] with chest pain and shortness of breath.  Echocardiogram showed moderately reduced LV systolic function with an EF of 35% with wall motion abnormality suggestive of stress-induced cardiomyopathy.  She underwent cardiac catheterization which showed mild to moderate nonobstructive disease.  She was treated medically.  Both digoxin and furosemide were discontinued.  Benazepril was switched to [facility].  She is feeling better overall with no chest pain or worsening dyspnea.    Past Medical History:  Diagnosis Date  . Arthritis of back    lumbar  . Atrial fibrillation ([place]) 2010  . Atrial  fibrillation ([place])   . Atrial fibrillation ([place])   . Chronic diastolic heart failure ([place])   . Colonic polyp   . Compression fracture of thoracic vertebra ([facility])   . Congestive heart failure, unspecified   . Coronary artery disease    mild non obstructive disease per cardiac cath in 2010  . Degeneration of lumbar or lumbosacral intervertebral disc   . Diverticulosis   . DM2 (diabetes mellitus, type 2) ([place])   . Hemorrhoids   . HLD (hyperlipidemia)   . HTN (hypertension)    diagnosed when she was in her 66s. Refractory. No RAS by [place] in 2010  . Long-term (current) use of anticoagulants   . Lumbosacral spondylosis without myelopathy   . Osteoarthrosis, unspecified whether generalized or localized, unspecified site   . Pain in thoracic spine   . Swelling of limb   . UTI (lower urinary tract infection)   . Varicose veins of lower extremities with other complications   . Vitamin D deficiency     Past Surgical History:  Procedure Laterality Date  . ADENOIDECTOMY    . CARDIAC CATHETERIZATION  2010   At [facility]. Mild nonobstructive CAD  . CORONARY ANGIOPLASTY    . EYE SURGERY    . TONSILLECTOMY       Current Outpatient Medications  Medication Sig Dispense Refill  . allopurinol (ZYLOPRIM) 300 MG tablet Take 1 tablet (300 mg total) by mouth daily as needed (for gout). 30 tablet 1  . atorvastatin (LIPITOR) 40 MG tablet TAKE 1 TABLET BY MOUTH EVERY DAY 30 tablet 1  . carvedilol (COREG) 25 MG  tablet Take 1 tablet (25 mg total) by mouth 2 (two) times daily with a meal. 180 tablet 2  . Cholecalciferol (VITAMIN D) 2000 UNITS CAPS Take 1 capsule by mouth daily.    . fish oil-omega-3 fatty acids 1000 MG capsule Take 1 g by mouth 2 (two) times daily.    Marland Kitchen glucose blood (ACCU-CHEK AVIVA PLUS) test strip Check two times daily. Dx code E11.65 100 each 0  . Lancets (ONETOUCH DELICA PLUS [id number]) MISC 1 each by Does not apply route 2 (two) times daily before a meal. Use to check blood  sugars twilce daily before meal 100 each 0  . metFORMIN (GLUCOPHAGE) 500 MG tablet Take 1 tablet (500 mg total) by mouth 3 (three) times daily with meals. 90 tablet 1  . sacubitril-valsartan (ENTRESTO) 24-26 MG Take 1 tablet by mouth 2 (two) times daily.    Marland Kitchen spironolactone (ALDACTONE) 25 MG tablet Take 12.5 mg by mouth daily.    Marland Kitchen warfarin (COUMADIN) 5 MG tablet Take 5 mg by mouth daily.    . isosorbide mononitrate (IMDUR) 30 MG 24 hr tablet Take 1 tablet (30 mg total) by mouth daily. Take 1/2 tablet 15 mg daily 90 tablet 3   No current facility-administered medications for this visit.    Allergies:   Bee venom, Iodinated diagnostic agents, Penicillins, and Tape    Social History:  The patient  reports that she has never smoked. She has never used smokeless tobacco. She reports that she does not drink alcohol and does not use drugs.   Family History:  The patient's family history includes Cancer in her sister; Heart attack in her father; Heart disease in her brother, father, mother, and another family member; Stroke in her mother.    ROS:  Please see the history of present illness.   Otherwise, review of systems are positive for none.   All other systems are reviewed and negative.    PHYSICAL EXAM: VS:  BP (!) 144/78   Pulse 98   Ht _0  (1.626 m)   Wt 193 lb 3.2 oz (87.6 kg)   SpO2 100%   BMI 33.16 kg/m  , BMI Body mass index is 33.16 kg/m. GEN: Well nourished, well developed, in no acute distress  HEENT: normal  Neck: no JVD, carotid bruits, or masses Cardiac: Irregularly irregular ; no murmurs, rubs, or gallops,  Respiratory:  clear to auscultation bilaterally, normal work of breathing GI: soft, nontender, nondistended, + BS MS: no deformity or atrophy  Skin: warm and dry, no rash Neuro:  Strength and sensation are intact Psych: euthymic mood, full affect Dorsalis pedis is palpable bilaterally.  Trace bilateral leg edema  EKG:  EKG is ordered today. EKG showed atrial  fibrillation with low voltage.  Ventricular rate is 98 bpm.  Recent Labs: 03/18/2020: BNP 205.5; BUN 20; Creatinine, Ser 1.30; Potassium 4.2; Sodium 146    Lipid Panel    Component Value Date/Time   CHOL 129 03/27/2019 0000   TRIG 79 03/27/2019 0000   HDL 62 03/27/2019 0000   CHOLHDL 2.1 03/27/2019 0000   LDLCALC 51 03/27/2019 0000      Wt Readings from Last 3 Encounters:  11/15/20 193 lb 3.2 oz (87.6 kg)  04/12/20 196 lb 6.4 oz (89.1 kg)  03/18/20 198 lb 12.8 oz (90.2 kg)       ASSESSMENT AND PLAN:  1.  Chronic atrial fibrillation:  Ventricular rate is reasonably controlled on carvedilol.  She is on long-term anticoagulation with  warfarin.   2. Essential hypertension: Blood pressure is reasonably controlled.  3. Coronary artery disease involving native coronary arteries without angina: Cardiac catheterization done last year at [facility] showed mild to moderate nonobstructive disease.  4. Hyperlipidemia: Continue treatment with atorvastatin.  Most recent LDL was 58.  5. Chronic systolic heart failure: She had cardiomyopathy last year due to what seems to be stress-induced cardiomyopathy.  She appears to be euvolemic without diuretics.  Continue carvedilol, Entresto and spironolactone.  6.  Chronic venous insufficiency with secondary lymphedema: Improved significantly compared to before.   Disposition:   FU with me in 6 months  Signed,  Kathlyn Sacramento, MD  11/17/2020 10:27 AM    [place]

## 2020-11-17 ENCOUNTER — Telehealth: Payer: Self-pay

## 2020-11-17 ENCOUNTER — Ambulatory Visit (INDEPENDENT_AMBULATORY_CARE_PROVIDER_SITE_OTHER): Payer: Medicare Other | Admitting: Cardiology

## 2020-11-17 DIAGNOSIS — Z7901 Long term (current) use of anticoagulants: Secondary | ICD-10-CM | POA: Diagnosis not present

## 2020-11-17 DIAGNOSIS — I482 Chronic atrial fibrillation, unspecified: Secondary | ICD-10-CM | POA: Diagnosis not present

## 2020-11-17 LAB — POCT INR: INR: 1.6 — AB (ref 2.0–3.0)

## 2020-11-17 NOTE — Telephone Encounter (Signed)
Called and spoke w/pt regarding overdue inr and they stated that they will complete asap

## 2020-11-25 ENCOUNTER — Ambulatory Visit (INDEPENDENT_AMBULATORY_CARE_PROVIDER_SITE_OTHER): Payer: Medicare Other | Admitting: Cardiology

## 2020-11-25 DIAGNOSIS — Z5181 Encounter for therapeutic drug level monitoring: Secondary | ICD-10-CM | POA: Diagnosis not present

## 2020-11-25 DIAGNOSIS — I482 Chronic atrial fibrillation, unspecified: Secondary | ICD-10-CM | POA: Diagnosis not present

## 2020-11-25 DIAGNOSIS — Z7901 Long term (current) use of anticoagulants: Secondary | ICD-10-CM

## 2020-11-25 LAB — POCT INR: INR: 1.9 — AB (ref 2.0–3.0)

## 2020-11-30 ENCOUNTER — Encounter: Payer: Self-pay | Admitting: Sports Medicine

## 2020-11-30 ENCOUNTER — Other Ambulatory Visit: Payer: Self-pay

## 2020-11-30 ENCOUNTER — Ambulatory Visit (INDEPENDENT_AMBULATORY_CARE_PROVIDER_SITE_OTHER): Payer: Medicare Other | Admitting: Sports Medicine

## 2020-11-30 DIAGNOSIS — M79675 Pain in left toe(s): Secondary | ICD-10-CM

## 2020-11-30 DIAGNOSIS — B351 Tinea unguium: Secondary | ICD-10-CM

## 2020-11-30 DIAGNOSIS — E785 Hyperlipidemia, unspecified: Secondary | ICD-10-CM | POA: Diagnosis not present

## 2020-11-30 DIAGNOSIS — E1140 Type 2 diabetes mellitus with diabetic neuropathy, unspecified: Secondary | ICD-10-CM | POA: Diagnosis not present

## 2020-11-30 DIAGNOSIS — Z23 Encounter for immunization: Secondary | ICD-10-CM | POA: Diagnosis not present

## 2020-11-30 DIAGNOSIS — Z79899 Other long term (current) drug therapy: Secondary | ICD-10-CM | POA: Diagnosis not present

## 2020-11-30 DIAGNOSIS — I5042 Chronic combined systolic (congestive) and diastolic (congestive) heart failure: Secondary | ICD-10-CM | POA: Diagnosis not present

## 2020-11-30 DIAGNOSIS — I739 Peripheral vascular disease, unspecified: Secondary | ICD-10-CM | POA: Diagnosis not present

## 2020-11-30 DIAGNOSIS — Z7901 Long term (current) use of anticoagulants: Secondary | ICD-10-CM | POA: Diagnosis not present

## 2020-11-30 DIAGNOSIS — E1165 Type 2 diabetes mellitus with hyperglycemia: Secondary | ICD-10-CM | POA: Diagnosis not present

## 2020-11-30 DIAGNOSIS — I2510 Atherosclerotic heart disease of native coronary artery without angina pectoris: Secondary | ICD-10-CM | POA: Diagnosis not present

## 2020-11-30 DIAGNOSIS — M79674 Pain in right toe(s): Secondary | ICD-10-CM | POA: Diagnosis not present

## 2020-11-30 DIAGNOSIS — I4891 Unspecified atrial fibrillation: Secondary | ICD-10-CM | POA: Diagnosis not present

## 2020-11-30 DIAGNOSIS — M10072 Idiopathic gout, left ankle and foot: Secondary | ICD-10-CM | POA: Diagnosis not present

## 2020-11-30 DIAGNOSIS — E559 Vitamin D deficiency, unspecified: Secondary | ICD-10-CM | POA: Diagnosis not present

## 2020-11-30 DIAGNOSIS — E1142 Type 2 diabetes mellitus with diabetic polyneuropathy: Secondary | ICD-10-CM | POA: Diagnosis not present

## 2020-11-30 NOTE — Progress Notes (Signed)
Subjective: Rebekah Stewart is a 86 y.o. female patient with history of diabetes who returns to office today complaining of long, painful nails while ambulating in shoes; unable to trim.  Patient states that the glucose reading was not recorded, went to PCP today and he took blood and last a1c was 6.9. Reports that she is feeling dizzy and that her pcp is aware. Patient denies any other pedal complaints.   Patient Active Problem List   Diagnosis Date Noted  . OSA (obstructive sleep apnea) 07/07/2019  . Respiratory failure with hypoxia ([place]) 10/10/2018  . Chronic anticoagulation 08/19/2018  . Non-insulin dependent type 2 diabetes mellitus ([place]) 08/13/2018  . Intertrigo 08/10/2018  . Pulmonary hypertension assoc with unclear multi-factorial mechanisms ([place])   . Acute on chronic diastolic heart failure ([place])   . Flash pulmonary edema ([place]) 08/09/2018  . Varicose veins of lower extremities with complications, bilateral 10/30/2016  . Dyslipidemia 05/21/2014  . Bilateral leg pain 11/26/2012  . Chronic diastolic heart failure ([place])   . Chronic atrial fibrillation ([place])   . Essential hypertension   . Coronary artery disease    Current Outpatient Medications on File Prior to Visit  Medication Sig Dispense Refill  . allopurinol (ZYLOPRIM) 300 MG tablet Take 1 tablet (300 mg total) by mouth daily as needed (for gout). 30 tablet 1  . atorvastatin (LIPITOR) 40 MG tablet TAKE 1 TABLET BY MOUTH EVERY DAY 30 tablet 1  . carvedilol (COREG) 25 MG tablet Take 1 tablet (25 mg total) by mouth 2 (two) times daily with a meal. 180 tablet 2  . Cholecalciferol (VITAMIN D) 2000 UNITS CAPS Take 1 capsule by mouth daily.    . fish oil-omega-3 fatty acids 1000 MG capsule Take 1 g by mouth 2 (two) times daily.    Marland Kitchen glucose blood (ACCU-CHEK AVIVA PLUS) test strip Check two times daily. Dx code E11.65 100 each 0  . isosorbide mononitrate (IMDUR) 30 MG 24 hr tablet Take 1 tablet (30 mg total) by mouth daily. Take 1/2  tablet 15 mg daily 90 tablet 3  . Lancets (ONETOUCH DELICA PLUS [id number]) MISC 1 each by Does not apply route 2 (two) times daily before a meal. Use to check blood sugars twilce daily before meal 100 each 0  . metFORMIN (GLUCOPHAGE) 500 MG tablet Take 1 tablet (500 mg total) by mouth 3 (three) times daily with meals. 90 tablet 1  . sacubitril-valsartan (ENTRESTO) 24-26 MG Take 1 tablet by mouth 2 (two) times daily.    Marland Kitchen spironolactone (ALDACTONE) 25 MG tablet Take 12.5 mg by mouth daily.    Marland Kitchen warfarin (COUMADIN) 5 MG tablet Take 5 mg by mouth daily.     No current facility-administered medications on file prior to visit.   Allergies  Allergen Reactions  . Bee Venom Shortness Of Breath and Swelling  . Iodinated Diagnostic Agents Itching  . Penicillins Swelling  . Tape     Pulls the skin    Recent Results (from the past 2160 hour(s))  POCT INR     Status: None   Collection Time: 09/04/20 11:05 AM  Result Value Ref Range   INR 2.4 2.0 - 3.0    Comment: self tester  POCT INR     Status: None   Collection Time: 09/12/20 12:00 AM  Result Value Ref Range   INR 2.0 2.0 - 3.0    Comment: self-tester  POCT INR     Status: None   Collection Time: 09/26/20 12:00 AM  Result Value Ref Range   INR 2.1 2.0 - 3.0    Comment: self tester  POCT INR     Status: Abnormal   Collection Time: 10/03/20 12:00 AM  Result Value Ref Range   INR 1.4 (A) 2.0 - 3.0  POCT INR     Status: None   Collection Time: 10/10/20 12:00 AM  Result Value Ref Range   INR 2.0 2.0 - 3.0    Comment: self tester  POCT INR     Status: None   Collection Time: 10/18/20 12:00 AM  Result Value Ref Range   INR 2.2 2.0 - 3.0    Comment: self-test  POCT INR     Status: None   Collection Time: 10/26/20 12:00 AM  Result Value Ref Range   INR 2.3 2.0 - 3.0    Comment: self tester  POCT INR     Status: None   Collection Time: 11/03/20 12:00 AM  Result Value Ref Range   INR 2.7 2.0 - 3.0    Comment: self tester  POCT  INR     Status: None   Collection Time: 11/09/20 12:00 AM  Result Value Ref Range   INR 2.1 2.0 - 3.0    Comment: self tester  POCT INR     Status: Abnormal   Collection Time: 11/17/20  4:38 PM  Result Value Ref Range   INR 1.6 (A) 2.0 - 3.0    Comment: self tester  POCT INR     Status: Abnormal   Collection Time: 11/25/20 12:00 AM  Result Value Ref Range   INR 1.9 (A) 2.0 - 3.0    Comment: self tester    Objective: General: Patient is awake, alert, and oriented x 3 and in no acute distress.  Integument: Skin is warm, dry and supple bilateral. Nails are tender, long, thickened and dystrophic with subungual debris, consistent with onychomycosis, 1-5 bilateral.  No acute signs of infection. No open lesions or preulcerative lesions present bilateral. Remaining integument unremarkable.  Vasculature:  Dorsalis Pedis pulse 1/4 bilateral. Posterior Tibial pulse  0/4 bilateral. Capillary fill time <5 sec 1-5 bilateral. Diminished hair growth to the level of the digits.Temperature gradient within normal limits.  Moderate varicosities and venous stasis hyperpigmentation with trace edema bilateral at ankles like previous.   Neurology: The patient has diminished sensation measured with a 5.07/10g Semmes Weinstein Monofilament at all pedal sites bilateral. Vibratory sensation diminished bilateral with tuning fork. No Babinski sign present bilateral.   Musculoskeletal: Asymptomatic pes planus, bunion and hammertoe. Muscular strength 5/5 in all lower extremity muscular groups bilateral without pain on range of motion. No tenderness with calf compression bilateral.  Assessment and Plan: Problem List Items Addressed This Visit   None   Visit Diagnoses    Pain due to onychomycosis of toenails of both feet    -  Primary   Type 2 diabetes, controlled, with neuropathy (HCC)       PVD (peripheral vascular disease) ([place])         -Examined patient. -Re-discussed and educated patient on diabetic foot  care and importance of daily foot inspection in setting of diabetes -Mechanically debrided all nails 1-5 bilateral using sterile nail nipper and filed with dremel without incident  -Patient to return  in 3 months for at risk nail care -Patient advised to call the office if any problems or questions arise in the meantime.  Landis Martins, DPM

## 2020-12-03 LAB — POCT INR: INR: 2.2 (ref 2.0–3.0)

## 2020-12-05 ENCOUNTER — Encounter (INDEPENDENT_AMBULATORY_CARE_PROVIDER_SITE_OTHER): Payer: Medicare Other | Admitting: Ophthalmology

## 2020-12-05 ENCOUNTER — Ambulatory Visit (INDEPENDENT_AMBULATORY_CARE_PROVIDER_SITE_OTHER): Payer: Medicare Other | Admitting: Cardiology

## 2020-12-05 DIAGNOSIS — Z7901 Long term (current) use of anticoagulants: Secondary | ICD-10-CM | POA: Diagnosis not present

## 2020-12-05 DIAGNOSIS — I482 Chronic atrial fibrillation, unspecified: Secondary | ICD-10-CM

## 2020-12-10 DIAGNOSIS — Z7901 Long term (current) use of anticoagulants: Secondary | ICD-10-CM | POA: Diagnosis not present

## 2020-12-10 LAB — POCT INR: INR: 2 (ref 2.0–3.0)

## 2020-12-12 ENCOUNTER — Ambulatory Visit (INDEPENDENT_AMBULATORY_CARE_PROVIDER_SITE_OTHER): Payer: Medicare Other | Admitting: Internal Medicine

## 2020-12-12 DIAGNOSIS — Z7901 Long term (current) use of anticoagulants: Secondary | ICD-10-CM

## 2020-12-12 DIAGNOSIS — I482 Chronic atrial fibrillation, unspecified: Secondary | ICD-10-CM | POA: Diagnosis not present

## 2020-12-17 LAB — POCT INR: INR: 2.3 (ref 2.0–3.0)

## 2020-12-19 ENCOUNTER — Ambulatory Visit (INDEPENDENT_AMBULATORY_CARE_PROVIDER_SITE_OTHER): Payer: Medicare Other | Admitting: Internal Medicine

## 2020-12-19 DIAGNOSIS — I482 Chronic atrial fibrillation, unspecified: Secondary | ICD-10-CM | POA: Diagnosis not present

## 2020-12-19 DIAGNOSIS — Z7901 Long term (current) use of anticoagulants: Secondary | ICD-10-CM

## 2020-12-28 ENCOUNTER — Telehealth: Payer: Self-pay

## 2020-12-28 NOTE — Telephone Encounter (Signed)
lmom for overdue inr 

## 2020-12-29 ENCOUNTER — Ambulatory Visit (INDEPENDENT_AMBULATORY_CARE_PROVIDER_SITE_OTHER): Payer: Medicare Other | Admitting: Cardiology

## 2020-12-29 DIAGNOSIS — Z7901 Long term (current) use of anticoagulants: Secondary | ICD-10-CM | POA: Diagnosis not present

## 2020-12-29 DIAGNOSIS — I482 Chronic atrial fibrillation, unspecified: Secondary | ICD-10-CM | POA: Diagnosis not present

## 2020-12-29 LAB — POCT INR: INR: 2.4 (ref 2.0–3.0)

## 2020-12-30 DIAGNOSIS — H8110 Benign paroxysmal vertigo, unspecified ear: Secondary | ICD-10-CM | POA: Diagnosis not present

## 2020-12-30 DIAGNOSIS — N39490 Overflow incontinence: Secondary | ICD-10-CM | POA: Diagnosis not present

## 2020-12-30 DIAGNOSIS — I4891 Unspecified atrial fibrillation: Secondary | ICD-10-CM | POA: Diagnosis not present

## 2020-12-30 DIAGNOSIS — I110 Hypertensive heart disease with heart failure: Secondary | ICD-10-CM | POA: Diagnosis not present

## 2020-12-30 DIAGNOSIS — I2510 Atherosclerotic heart disease of native coronary artery without angina pectoris: Secondary | ICD-10-CM | POA: Diagnosis not present

## 2020-12-30 DIAGNOSIS — Z79899 Other long term (current) drug therapy: Secondary | ICD-10-CM | POA: Diagnosis not present

## 2020-12-30 DIAGNOSIS — I5022 Chronic systolic (congestive) heart failure: Secondary | ICD-10-CM | POA: Diagnosis not present

## 2021-01-06 ENCOUNTER — Ambulatory Visit (INDEPENDENT_AMBULATORY_CARE_PROVIDER_SITE_OTHER): Payer: Medicare Other | Admitting: Cardiovascular Disease

## 2021-01-06 DIAGNOSIS — I482 Chronic atrial fibrillation, unspecified: Secondary | ICD-10-CM | POA: Diagnosis not present

## 2021-01-06 DIAGNOSIS — Z5181 Encounter for therapeutic drug level monitoring: Secondary | ICD-10-CM

## 2021-01-06 DIAGNOSIS — Z7901 Long term (current) use of anticoagulants: Secondary | ICD-10-CM

## 2021-01-06 LAB — POCT INR: INR: 2.1 (ref 2.0–3.0)

## 2021-01-11 ENCOUNTER — Encounter (INDEPENDENT_AMBULATORY_CARE_PROVIDER_SITE_OTHER): Payer: Medicare Other | Admitting: Ophthalmology

## 2021-01-14 DIAGNOSIS — Z8673 Personal history of transient ischemic attack (TIA), and cerebral infarction without residual deficits: Secondary | ICD-10-CM | POA: Insufficient documentation

## 2021-01-14 DIAGNOSIS — I428 Other cardiomyopathies: Secondary | ICD-10-CM | POA: Insufficient documentation

## 2021-01-14 DIAGNOSIS — I872 Venous insufficiency (chronic) (peripheral): Secondary | ICD-10-CM | POA: Insufficient documentation

## 2021-01-14 DIAGNOSIS — E669 Obesity, unspecified: Secondary | ICD-10-CM | POA: Insufficient documentation

## 2021-01-14 DIAGNOSIS — N39490 Overflow incontinence: Secondary | ICD-10-CM | POA: Insufficient documentation

## 2021-01-14 DIAGNOSIS — H8110 Benign paroxysmal vertigo, unspecified ear: Secondary | ICD-10-CM | POA: Insufficient documentation

## 2021-01-14 DIAGNOSIS — J449 Chronic obstructive pulmonary disease, unspecified: Secondary | ICD-10-CM | POA: Insufficient documentation

## 2021-01-15 DIAGNOSIS — Z7901 Long term (current) use of anticoagulants: Secondary | ICD-10-CM | POA: Diagnosis not present

## 2021-01-15 LAB — POCT INR: INR: 1.1 — AB (ref 2.0–3.0)

## 2021-01-16 ENCOUNTER — Ambulatory Visit (INDEPENDENT_AMBULATORY_CARE_PROVIDER_SITE_OTHER): Payer: Medicare Other | Admitting: Cardiovascular Disease

## 2021-01-16 DIAGNOSIS — I482 Chronic atrial fibrillation, unspecified: Secondary | ICD-10-CM

## 2021-01-16 DIAGNOSIS — Z7901 Long term (current) use of anticoagulants: Secondary | ICD-10-CM

## 2021-01-19 DIAGNOSIS — I5022 Chronic systolic (congestive) heart failure: Secondary | ICD-10-CM | POA: Diagnosis not present

## 2021-01-19 DIAGNOSIS — Z9981 Dependence on supplemental oxygen: Secondary | ICD-10-CM | POA: Diagnosis not present

## 2021-01-19 DIAGNOSIS — Z79899 Other long term (current) drug therapy: Secondary | ICD-10-CM | POA: Diagnosis not present

## 2021-01-19 DIAGNOSIS — I482 Chronic atrial fibrillation, unspecified: Secondary | ICD-10-CM | POA: Diagnosis not present

## 2021-01-19 DIAGNOSIS — I110 Hypertensive heart disease with heart failure: Secondary | ICD-10-CM | POA: Diagnosis not present

## 2021-01-19 DIAGNOSIS — G4733 Obstructive sleep apnea (adult) (pediatric): Secondary | ICD-10-CM | POA: Diagnosis not present

## 2021-01-19 DIAGNOSIS — I2510 Atherosclerotic heart disease of native coronary artery without angina pectoris: Secondary | ICD-10-CM | POA: Diagnosis not present

## 2021-01-19 DIAGNOSIS — Z7901 Long term (current) use of anticoagulants: Secondary | ICD-10-CM | POA: Diagnosis not present

## 2021-01-19 DIAGNOSIS — Z7984 Long term (current) use of oral hypoglycemic drugs: Secondary | ICD-10-CM | POA: Diagnosis not present

## 2021-01-23 ENCOUNTER — Telehealth: Payer: Self-pay

## 2021-01-23 NOTE — Telephone Encounter (Signed)
Lm for Warfarin overdue self tester

## 2021-01-25 ENCOUNTER — Ambulatory Visit (INDEPENDENT_AMBULATORY_CARE_PROVIDER_SITE_OTHER): Payer: Medicare Other | Admitting: Cardiovascular Disease

## 2021-01-25 DIAGNOSIS — I482 Chronic atrial fibrillation, unspecified: Secondary | ICD-10-CM | POA: Diagnosis not present

## 2021-01-25 DIAGNOSIS — Z7901 Long term (current) use of anticoagulants: Secondary | ICD-10-CM

## 2021-01-25 LAB — POCT INR: INR: 2.2 (ref 2.0–3.0)

## 2021-02-02 DIAGNOSIS — Z7901 Long term (current) use of anticoagulants: Secondary | ICD-10-CM | POA: Diagnosis not present

## 2021-02-02 DIAGNOSIS — J302 Other seasonal allergic rhinitis: Secondary | ICD-10-CM | POA: Diagnosis not present

## 2021-02-02 DIAGNOSIS — I130 Hypertensive heart and chronic kidney disease with heart failure and stage 1 through stage 4 chronic kidney disease, or unspecified chronic kidney disease: Secondary | ICD-10-CM | POA: Diagnosis not present

## 2021-02-02 DIAGNOSIS — H9201 Otalgia, right ear: Secondary | ICD-10-CM | POA: Diagnosis not present

## 2021-02-02 DIAGNOSIS — E669 Obesity, unspecified: Secondary | ICD-10-CM | POA: Diagnosis not present

## 2021-02-02 DIAGNOSIS — I4891 Unspecified atrial fibrillation: Secondary | ICD-10-CM | POA: Diagnosis not present

## 2021-02-02 DIAGNOSIS — I5020 Unspecified systolic (congestive) heart failure: Secondary | ICD-10-CM | POA: Diagnosis not present

## 2021-02-02 DIAGNOSIS — N183 Chronic kidney disease, stage 3 unspecified: Secondary | ICD-10-CM | POA: Diagnosis not present

## 2021-02-02 DIAGNOSIS — Z79899 Other long term (current) drug therapy: Secondary | ICD-10-CM | POA: Diagnosis not present

## 2021-02-03 ENCOUNTER — Ambulatory Visit (INDEPENDENT_AMBULATORY_CARE_PROVIDER_SITE_OTHER): Payer: Medicare Other | Admitting: Cardiology

## 2021-02-03 DIAGNOSIS — I482 Chronic atrial fibrillation, unspecified: Secondary | ICD-10-CM

## 2021-02-03 DIAGNOSIS — Z7901 Long term (current) use of anticoagulants: Secondary | ICD-10-CM

## 2021-02-03 DIAGNOSIS — Z5181 Encounter for therapeutic drug level monitoring: Secondary | ICD-10-CM

## 2021-02-03 LAB — POCT INR: INR: 1.6 — AB (ref 2.0–3.0)

## 2021-02-10 ENCOUNTER — Ambulatory Visit (INDEPENDENT_AMBULATORY_CARE_PROVIDER_SITE_OTHER): Payer: Medicare Other | Admitting: Cardiology

## 2021-02-10 DIAGNOSIS — Z7901 Long term (current) use of anticoagulants: Secondary | ICD-10-CM

## 2021-02-10 DIAGNOSIS — Z5181 Encounter for therapeutic drug level monitoring: Secondary | ICD-10-CM | POA: Diagnosis not present

## 2021-02-10 DIAGNOSIS — I482 Chronic atrial fibrillation, unspecified: Secondary | ICD-10-CM | POA: Diagnosis not present

## 2021-02-10 LAB — POCT INR: INR: 2.1 (ref 2.0–3.0)

## 2021-02-13 DIAGNOSIS — M79602 Pain in left arm: Secondary | ICD-10-CM | POA: Diagnosis not present

## 2021-02-13 DIAGNOSIS — J302 Other seasonal allergic rhinitis: Secondary | ICD-10-CM | POA: Diagnosis present

## 2021-02-13 DIAGNOSIS — I517 Cardiomegaly: Secondary | ICD-10-CM | POA: Diagnosis not present

## 2021-02-13 DIAGNOSIS — I351 Nonrheumatic aortic (valve) insufficiency: Secondary | ICD-10-CM | POA: Diagnosis not present

## 2021-02-13 DIAGNOSIS — M79642 Pain in left hand: Secondary | ICD-10-CM | POA: Diagnosis not present

## 2021-02-13 DIAGNOSIS — R0789 Other chest pain: Secondary | ICD-10-CM | POA: Diagnosis not present

## 2021-02-13 DIAGNOSIS — I509 Heart failure, unspecified: Secondary | ICD-10-CM | POA: Diagnosis not present

## 2021-02-13 DIAGNOSIS — M79605 Pain in left leg: Secondary | ICD-10-CM | POA: Diagnosis not present

## 2021-02-13 DIAGNOSIS — I10 Essential (primary) hypertension: Secondary | ICD-10-CM | POA: Diagnosis not present

## 2021-02-13 DIAGNOSIS — M47812 Spondylosis without myelopathy or radiculopathy, cervical region: Secondary | ICD-10-CM | POA: Diagnosis not present

## 2021-02-13 DIAGNOSIS — E669 Obesity, unspecified: Secondary | ICD-10-CM | POA: Diagnosis present

## 2021-02-13 DIAGNOSIS — J449 Chronic obstructive pulmonary disease, unspecified: Secondary | ICD-10-CM | POA: Diagnosis present

## 2021-02-13 DIAGNOSIS — G4733 Obstructive sleep apnea (adult) (pediatric): Secondary | ICD-10-CM | POA: Diagnosis present

## 2021-02-13 DIAGNOSIS — R059 Cough, unspecified: Secondary | ICD-10-CM | POA: Diagnosis not present

## 2021-02-13 DIAGNOSIS — M7989 Other specified soft tissue disorders: Secondary | ICD-10-CM | POA: Diagnosis not present

## 2021-02-13 DIAGNOSIS — K648 Other hemorrhoids: Secondary | ICD-10-CM | POA: Diagnosis present

## 2021-02-13 DIAGNOSIS — I872 Venous insufficiency (chronic) (peripheral): Secondary | ICD-10-CM | POA: Diagnosis present

## 2021-02-13 DIAGNOSIS — R778 Other specified abnormalities of plasma proteins: Secondary | ICD-10-CM | POA: Diagnosis not present

## 2021-02-13 DIAGNOSIS — R0602 Shortness of breath: Secondary | ICD-10-CM | POA: Diagnosis not present

## 2021-02-13 DIAGNOSIS — I878 Other specified disorders of veins: Secondary | ICD-10-CM | POA: Diagnosis not present

## 2021-02-13 DIAGNOSIS — I2510 Atherosclerotic heart disease of native coronary artery without angina pectoris: Secondary | ICD-10-CM | POA: Diagnosis not present

## 2021-02-13 DIAGNOSIS — I428 Other cardiomyopathies: Secondary | ICD-10-CM | POA: Diagnosis present

## 2021-02-13 DIAGNOSIS — I248 Other forms of acute ischemic heart disease: Secondary | ICD-10-CM | POA: Diagnosis present

## 2021-02-13 DIAGNOSIS — I110 Hypertensive heart disease with heart failure: Secondary | ICD-10-CM | POA: Diagnosis not present

## 2021-02-13 DIAGNOSIS — M189 Osteoarthritis of first carpometacarpal joint, unspecified: Secondary | ICD-10-CM | POA: Diagnosis present

## 2021-02-13 DIAGNOSIS — T1490XA Injury, unspecified, initial encounter: Secondary | ICD-10-CM | POA: Diagnosis not present

## 2021-02-13 DIAGNOSIS — I358 Other nonrheumatic aortic valve disorders: Secondary | ICD-10-CM | POA: Diagnosis not present

## 2021-02-13 DIAGNOSIS — N39490 Overflow incontinence: Secondary | ICD-10-CM | POA: Diagnosis present

## 2021-02-13 DIAGNOSIS — I130 Hypertensive heart and chronic kidney disease with heart failure and stage 1 through stage 4 chronic kidney disease, or unspecified chronic kidney disease: Secondary | ICD-10-CM | POA: Diagnosis present

## 2021-02-13 DIAGNOSIS — Z6833 Body mass index (BMI) 33.0-33.9, adult: Secondary | ICD-10-CM | POA: Diagnosis not present

## 2021-02-13 DIAGNOSIS — E785 Hyperlipidemia, unspecified: Secondary | ICD-10-CM | POA: Diagnosis not present

## 2021-02-13 DIAGNOSIS — R42 Dizziness and giddiness: Secondary | ICD-10-CM | POA: Diagnosis not present

## 2021-02-13 DIAGNOSIS — M19032 Primary osteoarthritis, left wrist: Secondary | ICD-10-CM | POA: Diagnosis present

## 2021-02-13 DIAGNOSIS — N183 Chronic kidney disease, stage 3 unspecified: Secondary | ICD-10-CM | POA: Diagnosis present

## 2021-02-13 DIAGNOSIS — I5023 Acute on chronic systolic (congestive) heart failure: Secondary | ICD-10-CM | POA: Diagnosis not present

## 2021-02-13 DIAGNOSIS — I4891 Unspecified atrial fibrillation: Secondary | ICD-10-CM | POA: Diagnosis not present

## 2021-02-13 DIAGNOSIS — I348 Other nonrheumatic mitral valve disorders: Secondary | ICD-10-CM | POA: Diagnosis not present

## 2021-02-13 DIAGNOSIS — I482 Chronic atrial fibrillation, unspecified: Secondary | ICD-10-CM | POA: Diagnosis present

## 2021-02-13 DIAGNOSIS — H8111 Benign paroxysmal vertigo, right ear: Secondary | ICD-10-CM | POA: Diagnosis present

## 2021-02-13 DIAGNOSIS — N390 Urinary tract infection, site not specified: Secondary | ICD-10-CM | POA: Diagnosis not present

## 2021-02-13 DIAGNOSIS — Z8673 Personal history of transient ischemic attack (TIA), and cerebral infarction without residual deficits: Secondary | ICD-10-CM | POA: Diagnosis not present

## 2021-02-13 DIAGNOSIS — Z602 Problems related to living alone: Secondary | ICD-10-CM | POA: Diagnosis present

## 2021-02-13 DIAGNOSIS — N179 Acute kidney failure, unspecified: Secondary | ICD-10-CM | POA: Diagnosis not present

## 2021-02-20 ENCOUNTER — Encounter (INDEPENDENT_AMBULATORY_CARE_PROVIDER_SITE_OTHER): Payer: Medicare Other | Admitting: Ophthalmology

## 2021-02-21 ENCOUNTER — Telehealth: Payer: Self-pay

## 2021-02-21 NOTE — Telephone Encounter (Signed)
Pt called and stated they are in the [facility] [place] for over a week and that they drew her inr in the hospital there and it was 1.9 and let me know that she will check it when she gets home.

## 2021-02-25 DIAGNOSIS — Z7901 Long term (current) use of anticoagulants: Secondary | ICD-10-CM | POA: Diagnosis not present

## 2021-02-25 LAB — POCT INR: INR: 1.6 — AB (ref 2.0–3.0)

## 2021-02-28 ENCOUNTER — Ambulatory Visit (INDEPENDENT_AMBULATORY_CARE_PROVIDER_SITE_OTHER): Payer: Medicare Other | Admitting: Cardiology

## 2021-02-28 DIAGNOSIS — Z7901 Long term (current) use of anticoagulants: Secondary | ICD-10-CM

## 2021-02-28 DIAGNOSIS — I482 Chronic atrial fibrillation, unspecified: Secondary | ICD-10-CM | POA: Diagnosis not present

## 2021-03-01 DIAGNOSIS — I5022 Chronic systolic (congestive) heart failure: Secondary | ICD-10-CM | POA: Diagnosis not present

## 2021-03-01 DIAGNOSIS — I4891 Unspecified atrial fibrillation: Secondary | ICD-10-CM | POA: Diagnosis not present

## 2021-03-02 DIAGNOSIS — I5022 Chronic systolic (congestive) heart failure: Secondary | ICD-10-CM | POA: Diagnosis not present

## 2021-03-02 DIAGNOSIS — R1030 Lower abdominal pain, unspecified: Secondary | ICD-10-CM | POA: Diagnosis not present

## 2021-03-02 DIAGNOSIS — Z79899 Other long term (current) drug therapy: Secondary | ICD-10-CM | POA: Diagnosis not present

## 2021-03-02 DIAGNOSIS — R7989 Other specified abnormal findings of blood chemistry: Secondary | ICD-10-CM | POA: Diagnosis not present

## 2021-03-02 DIAGNOSIS — H9201 Otalgia, right ear: Secondary | ICD-10-CM | POA: Diagnosis not present

## 2021-03-02 DIAGNOSIS — Z87440 Personal history of urinary (tract) infections: Secondary | ICD-10-CM | POA: Diagnosis not present

## 2021-03-02 DIAGNOSIS — I5020 Unspecified systolic (congestive) heart failure: Secondary | ICD-10-CM | POA: Diagnosis not present

## 2021-03-02 DIAGNOSIS — E119 Type 2 diabetes mellitus without complications: Secondary | ICD-10-CM | POA: Diagnosis not present

## 2021-03-02 DIAGNOSIS — Z8719 Personal history of other diseases of the digestive system: Secondary | ICD-10-CM | POA: Diagnosis not present

## 2021-03-02 DIAGNOSIS — Z09 Encounter for follow-up examination after completed treatment for conditions other than malignant neoplasm: Secondary | ICD-10-CM | POA: Diagnosis not present

## 2021-03-03 ENCOUNTER — Ambulatory Visit: Payer: Medicare Other | Admitting: Sports Medicine

## 2021-03-09 DIAGNOSIS — R509 Fever, unspecified: Secondary | ICD-10-CM | POA: Diagnosis not present

## 2021-03-09 DIAGNOSIS — Z20822 Contact with and (suspected) exposure to covid-19: Secondary | ICD-10-CM | POA: Diagnosis not present

## 2021-03-10 DIAGNOSIS — M48061 Spinal stenosis, lumbar region without neurogenic claudication: Secondary | ICD-10-CM | POA: Diagnosis present

## 2021-03-10 DIAGNOSIS — I5023 Acute on chronic systolic (congestive) heart failure: Secondary | ICD-10-CM | POA: Diagnosis not present

## 2021-03-10 DIAGNOSIS — R109 Unspecified abdominal pain: Secondary | ICD-10-CM | POA: Diagnosis not present

## 2021-03-10 DIAGNOSIS — N183 Chronic kidney disease, stage 3 unspecified: Secondary | ICD-10-CM | POA: Diagnosis present

## 2021-03-10 DIAGNOSIS — J449 Chronic obstructive pulmonary disease, unspecified: Secondary | ICD-10-CM | POA: Diagnosis present

## 2021-03-10 DIAGNOSIS — R0789 Other chest pain: Secondary | ICD-10-CM | POA: Diagnosis not present

## 2021-03-10 DIAGNOSIS — L03116 Cellulitis of left lower limb: Secondary | ICD-10-CM | POA: Diagnosis present

## 2021-03-10 DIAGNOSIS — N3289 Other specified disorders of bladder: Secondary | ICD-10-CM | POA: Diagnosis not present

## 2021-03-10 DIAGNOSIS — E1142 Type 2 diabetes mellitus with diabetic polyneuropathy: Secondary | ICD-10-CM | POA: Diagnosis present

## 2021-03-10 DIAGNOSIS — S99922A Unspecified injury of left foot, initial encounter: Secondary | ICD-10-CM | POA: Diagnosis not present

## 2021-03-10 DIAGNOSIS — I10 Essential (primary) hypertension: Secondary | ICD-10-CM | POA: Diagnosis not present

## 2021-03-10 DIAGNOSIS — R791 Abnormal coagulation profile: Secondary | ICD-10-CM | POA: Diagnosis not present

## 2021-03-10 DIAGNOSIS — R262 Difficulty in walking, not elsewhere classified: Secondary | ICD-10-CM | POA: Diagnosis present

## 2021-03-10 DIAGNOSIS — M47816 Spondylosis without myelopathy or radiculopathy, lumbar region: Secondary | ICD-10-CM | POA: Diagnosis not present

## 2021-03-10 DIAGNOSIS — R079 Chest pain, unspecified: Secondary | ICD-10-CM | POA: Diagnosis not present

## 2021-03-10 DIAGNOSIS — E785 Hyperlipidemia, unspecified: Secondary | ICD-10-CM | POA: Diagnosis present

## 2021-03-10 DIAGNOSIS — M6281 Muscle weakness (generalized): Secondary | ICD-10-CM | POA: Diagnosis present

## 2021-03-10 DIAGNOSIS — J9811 Atelectasis: Secondary | ICD-10-CM | POA: Diagnosis present

## 2021-03-10 DIAGNOSIS — L089 Local infection of the skin and subcutaneous tissue, unspecified: Secondary | ICD-10-CM | POA: Diagnosis not present

## 2021-03-10 DIAGNOSIS — I4891 Unspecified atrial fibrillation: Secondary | ICD-10-CM | POA: Diagnosis not present

## 2021-03-10 DIAGNOSIS — I4811 Longstanding persistent atrial fibrillation: Secondary | ICD-10-CM | POA: Diagnosis present

## 2021-03-10 DIAGNOSIS — F419 Anxiety disorder, unspecified: Secondary | ICD-10-CM | POA: Diagnosis present

## 2021-03-10 DIAGNOSIS — A281 Cat-scratch disease: Secondary | ICD-10-CM | POA: Diagnosis not present

## 2021-03-10 DIAGNOSIS — M25562 Pain in left knee: Secondary | ICD-10-CM | POA: Diagnosis present

## 2021-03-10 DIAGNOSIS — F802 Mixed receptive-expressive language disorder: Secondary | ICD-10-CM | POA: Diagnosis present

## 2021-03-10 DIAGNOSIS — M1612 Unilateral primary osteoarthritis, left hip: Secondary | ICD-10-CM | POA: Diagnosis not present

## 2021-03-10 DIAGNOSIS — Z7901 Long term (current) use of anticoagulants: Secondary | ICD-10-CM | POA: Diagnosis not present

## 2021-03-10 DIAGNOSIS — Z9842 Cataract extraction status, left eye: Secondary | ICD-10-CM | POA: Diagnosis not present

## 2021-03-10 DIAGNOSIS — I2510 Atherosclerotic heart disease of native coronary artery without angina pectoris: Secondary | ICD-10-CM | POA: Diagnosis present

## 2021-03-10 DIAGNOSIS — I255 Ischemic cardiomyopathy: Secondary | ICD-10-CM | POA: Diagnosis present

## 2021-03-10 DIAGNOSIS — I959 Hypotension, unspecified: Secondary | ICD-10-CM | POA: Diagnosis not present

## 2021-03-10 DIAGNOSIS — Z8673 Personal history of transient ischemic attack (TIA), and cerebral infarction without residual deficits: Secondary | ICD-10-CM | POA: Diagnosis not present

## 2021-03-10 DIAGNOSIS — R2681 Unsteadiness on feet: Secondary | ICD-10-CM | POA: Diagnosis present

## 2021-03-10 DIAGNOSIS — I5022 Chronic systolic (congestive) heart failure: Secondary | ICD-10-CM | POA: Diagnosis not present

## 2021-03-10 DIAGNOSIS — E1122 Type 2 diabetes mellitus with diabetic chronic kidney disease: Secondary | ICD-10-CM | POA: Diagnosis present

## 2021-03-10 DIAGNOSIS — I480 Paroxysmal atrial fibrillation: Secondary | ICD-10-CM | POA: Diagnosis present

## 2021-03-10 DIAGNOSIS — N179 Acute kidney failure, unspecified: Secondary | ICD-10-CM | POA: Diagnosis not present

## 2021-03-10 DIAGNOSIS — Z9089 Acquired absence of other organs: Secondary | ICD-10-CM | POA: Diagnosis not present

## 2021-03-10 DIAGNOSIS — Z880 Allergy status to penicillin: Secondary | ICD-10-CM | POA: Diagnosis not present

## 2021-03-10 DIAGNOSIS — B9620 Unspecified Escherichia coli [E. coli] as the cause of diseases classified elsewhere: Secondary | ICD-10-CM | POA: Diagnosis present

## 2021-03-10 DIAGNOSIS — R41841 Cognitive communication deficit: Secondary | ICD-10-CM | POA: Diagnosis present

## 2021-03-10 DIAGNOSIS — N10 Acute tubulo-interstitial nephritis: Secondary | ICD-10-CM | POA: Diagnosis not present

## 2021-03-10 DIAGNOSIS — Z28310 Unvaccinated for covid-19: Secondary | ICD-10-CM | POA: Diagnosis not present

## 2021-03-10 DIAGNOSIS — I130 Hypertensive heart and chronic kidney disease with heart failure and stage 1 through stage 4 chronic kidney disease, or unspecified chronic kidney disease: Secondary | ICD-10-CM | POA: Diagnosis not present

## 2021-03-10 DIAGNOSIS — G4733 Obstructive sleep apnea (adult) (pediatric): Secondary | ICD-10-CM | POA: Diagnosis present

## 2021-03-10 DIAGNOSIS — E119 Type 2 diabetes mellitus without complications: Secondary | ICD-10-CM | POA: Diagnosis not present

## 2021-03-10 DIAGNOSIS — H9201 Otalgia, right ear: Secondary | ICD-10-CM | POA: Diagnosis present

## 2021-03-20 ENCOUNTER — Telehealth: Payer: Self-pay

## 2021-03-20 DIAGNOSIS — E1140 Type 2 diabetes mellitus with diabetic neuropathy, unspecified: Secondary | ICD-10-CM | POA: Diagnosis not present

## 2021-03-20 DIAGNOSIS — I739 Peripheral vascular disease, unspecified: Secondary | ICD-10-CM | POA: Diagnosis not present

## 2021-03-20 DIAGNOSIS — E785 Hyperlipidemia, unspecified: Secondary | ICD-10-CM | POA: Diagnosis not present

## 2021-03-20 DIAGNOSIS — M1A00X0 Idiopathic chronic gout, unspecified site, without tophus (tophi): Secondary | ICD-10-CM | POA: Diagnosis not present

## 2021-03-20 DIAGNOSIS — R262 Difficulty in walking, not elsewhere classified: Secondary | ICD-10-CM | POA: Diagnosis present

## 2021-03-20 DIAGNOSIS — Z8673 Personal history of transient ischemic attack (TIA), and cerebral infarction without residual deficits: Secondary | ICD-10-CM | POA: Diagnosis not present

## 2021-03-20 DIAGNOSIS — R41841 Cognitive communication deficit: Secondary | ICD-10-CM | POA: Diagnosis present

## 2021-03-20 DIAGNOSIS — I10 Essential (primary) hypertension: Secondary | ICD-10-CM | POA: Diagnosis not present

## 2021-03-20 DIAGNOSIS — F802 Mixed receptive-expressive language disorder: Secondary | ICD-10-CM | POA: Diagnosis present

## 2021-03-20 DIAGNOSIS — J449 Chronic obstructive pulmonary disease, unspecified: Secondary | ICD-10-CM | POA: Diagnosis not present

## 2021-03-20 DIAGNOSIS — I259 Chronic ischemic heart disease, unspecified: Secondary | ICD-10-CM | POA: Diagnosis not present

## 2021-03-20 DIAGNOSIS — L03116 Cellulitis of left lower limb: Secondary | ICD-10-CM | POA: Diagnosis not present

## 2021-03-20 DIAGNOSIS — M48061 Spinal stenosis, lumbar region without neurogenic claudication: Secondary | ICD-10-CM | POA: Diagnosis present

## 2021-03-20 DIAGNOSIS — B351 Tinea unguium: Secondary | ICD-10-CM | POA: Diagnosis not present

## 2021-03-20 DIAGNOSIS — E119 Type 2 diabetes mellitus without complications: Secondary | ICD-10-CM | POA: Diagnosis not present

## 2021-03-20 DIAGNOSIS — N179 Acute kidney failure, unspecified: Secondary | ICD-10-CM | POA: Diagnosis not present

## 2021-03-20 DIAGNOSIS — M4802 Spinal stenosis, cervical region: Secondary | ICD-10-CM | POA: Diagnosis not present

## 2021-03-20 DIAGNOSIS — I5022 Chronic systolic (congestive) heart failure: Secondary | ICD-10-CM | POA: Diagnosis not present

## 2021-03-20 DIAGNOSIS — E1159 Type 2 diabetes mellitus with other circulatory complications: Secondary | ICD-10-CM | POA: Diagnosis not present

## 2021-03-20 DIAGNOSIS — E559 Vitamin D deficiency, unspecified: Secondary | ICD-10-CM | POA: Diagnosis not present

## 2021-03-20 DIAGNOSIS — M25562 Pain in left knee: Secondary | ICD-10-CM | POA: Diagnosis present

## 2021-03-20 DIAGNOSIS — I4821 Permanent atrial fibrillation: Secondary | ICD-10-CM | POA: Diagnosis not present

## 2021-03-20 DIAGNOSIS — I4811 Longstanding persistent atrial fibrillation: Secondary | ICD-10-CM | POA: Diagnosis present

## 2021-03-20 DIAGNOSIS — N1832 Chronic kidney disease, stage 3b: Secondary | ICD-10-CM | POA: Diagnosis not present

## 2021-03-20 DIAGNOSIS — R2681 Unsteadiness on feet: Secondary | ICD-10-CM | POA: Diagnosis present

## 2021-03-20 DIAGNOSIS — I130 Hypertensive heart and chronic kidney disease with heart failure and stage 1 through stage 4 chronic kidney disease, or unspecified chronic kidney disease: Secondary | ICD-10-CM | POA: Diagnosis not present

## 2021-03-20 DIAGNOSIS — Z7901 Long term (current) use of anticoagulants: Secondary | ICD-10-CM | POA: Diagnosis not present

## 2021-03-20 DIAGNOSIS — M6281 Muscle weakness (generalized): Secondary | ICD-10-CM | POA: Diagnosis not present

## 2021-03-20 DIAGNOSIS — N183 Chronic kidney disease, stage 3 unspecified: Secondary | ICD-10-CM | POA: Diagnosis not present

## 2021-03-20 DIAGNOSIS — I5023 Acute on chronic systolic (congestive) heart failure: Secondary | ICD-10-CM | POA: Diagnosis not present

## 2021-03-20 DIAGNOSIS — N10 Acute tubulo-interstitial nephritis: Secondary | ICD-10-CM | POA: Diagnosis not present

## 2021-03-20 DIAGNOSIS — N390 Urinary tract infection, site not specified: Secondary | ICD-10-CM | POA: Diagnosis not present

## 2021-03-20 DIAGNOSIS — N1831 Chronic kidney disease, stage 3a: Secondary | ICD-10-CM | POA: Diagnosis not present

## 2021-03-20 DIAGNOSIS — F4322 Adjustment disorder with anxiety: Secondary | ICD-10-CM | POA: Diagnosis not present

## 2021-03-20 DIAGNOSIS — I6930 Unspecified sequelae of cerebral infarction: Secondary | ICD-10-CM | POA: Diagnosis not present

## 2021-03-20 DIAGNOSIS — L03032 Cellulitis of left toe: Secondary | ICD-10-CM | POA: Diagnosis not present

## 2021-03-20 NOTE — Telephone Encounter (Signed)
Called and spoke w/pt who stated that they were still hospitalized and they stated that they will call us when the are released and complete a coumadin check and they voiced that they will call us to let us know

## 2021-03-20 NOTE — Telephone Encounter (Signed)
Lmom for overdue inr 

## 2021-03-21 DIAGNOSIS — E785 Hyperlipidemia, unspecified: Secondary | ICD-10-CM | POA: Diagnosis not present

## 2021-03-21 DIAGNOSIS — I10 Essential (primary) hypertension: Secondary | ICD-10-CM | POA: Diagnosis not present

## 2021-03-21 DIAGNOSIS — M6281 Muscle weakness (generalized): Secondary | ICD-10-CM | POA: Diagnosis not present

## 2021-03-21 DIAGNOSIS — I259 Chronic ischemic heart disease, unspecified: Secondary | ICD-10-CM | POA: Diagnosis not present

## 2021-03-21 DIAGNOSIS — M4802 Spinal stenosis, cervical region: Secondary | ICD-10-CM | POA: Diagnosis not present

## 2021-03-21 DIAGNOSIS — L03032 Cellulitis of left toe: Secondary | ICD-10-CM | POA: Diagnosis not present

## 2021-03-21 DIAGNOSIS — I4821 Permanent atrial fibrillation: Secondary | ICD-10-CM | POA: Diagnosis not present

## 2021-03-21 DIAGNOSIS — E1140 Type 2 diabetes mellitus with diabetic neuropathy, unspecified: Secondary | ICD-10-CM | POA: Diagnosis not present

## 2021-03-21 DIAGNOSIS — N1831 Chronic kidney disease, stage 3a: Secondary | ICD-10-CM | POA: Diagnosis not present

## 2021-03-21 DIAGNOSIS — I6930 Unspecified sequelae of cerebral infarction: Secondary | ICD-10-CM | POA: Diagnosis not present

## 2021-03-21 DIAGNOSIS — N390 Urinary tract infection, site not specified: Secondary | ICD-10-CM | POA: Diagnosis not present

## 2021-03-21 DIAGNOSIS — I5022 Chronic systolic (congestive) heart failure: Secondary | ICD-10-CM | POA: Diagnosis not present

## 2021-03-23 DIAGNOSIS — I10 Essential (primary) hypertension: Secondary | ICD-10-CM | POA: Diagnosis not present

## 2021-03-23 DIAGNOSIS — I4821 Permanent atrial fibrillation: Secondary | ICD-10-CM | POA: Diagnosis not present

## 2021-03-23 DIAGNOSIS — I259 Chronic ischemic heart disease, unspecified: Secondary | ICD-10-CM | POA: Diagnosis not present

## 2021-03-23 DIAGNOSIS — E1140 Type 2 diabetes mellitus with diabetic neuropathy, unspecified: Secondary | ICD-10-CM | POA: Diagnosis not present

## 2021-03-23 DIAGNOSIS — E785 Hyperlipidemia, unspecified: Secondary | ICD-10-CM | POA: Diagnosis not present

## 2021-03-23 DIAGNOSIS — I6930 Unspecified sequelae of cerebral infarction: Secondary | ICD-10-CM | POA: Diagnosis not present

## 2021-03-23 DIAGNOSIS — N1831 Chronic kidney disease, stage 3a: Secondary | ICD-10-CM | POA: Diagnosis not present

## 2021-03-23 DIAGNOSIS — I5022 Chronic systolic (congestive) heart failure: Secondary | ICD-10-CM | POA: Diagnosis not present

## 2021-03-23 DIAGNOSIS — L03032 Cellulitis of left toe: Secondary | ICD-10-CM | POA: Diagnosis not present

## 2021-03-23 DIAGNOSIS — N390 Urinary tract infection, site not specified: Secondary | ICD-10-CM | POA: Diagnosis not present

## 2021-03-23 DIAGNOSIS — M4802 Spinal stenosis, cervical region: Secondary | ICD-10-CM | POA: Diagnosis not present

## 2021-03-23 DIAGNOSIS — M6281 Muscle weakness (generalized): Secondary | ICD-10-CM | POA: Diagnosis not present

## 2021-03-27 DIAGNOSIS — N1831 Chronic kidney disease, stage 3a: Secondary | ICD-10-CM | POA: Diagnosis not present

## 2021-03-27 DIAGNOSIS — M4802 Spinal stenosis, cervical region: Secondary | ICD-10-CM | POA: Diagnosis not present

## 2021-03-27 DIAGNOSIS — I259 Chronic ischemic heart disease, unspecified: Secondary | ICD-10-CM | POA: Diagnosis not present

## 2021-03-27 DIAGNOSIS — L03032 Cellulitis of left toe: Secondary | ICD-10-CM | POA: Diagnosis not present

## 2021-03-27 DIAGNOSIS — N390 Urinary tract infection, site not specified: Secondary | ICD-10-CM | POA: Diagnosis not present

## 2021-03-27 DIAGNOSIS — M6281 Muscle weakness (generalized): Secondary | ICD-10-CM | POA: Diagnosis not present

## 2021-03-27 DIAGNOSIS — I4821 Permanent atrial fibrillation: Secondary | ICD-10-CM | POA: Diagnosis not present

## 2021-03-27 DIAGNOSIS — I10 Essential (primary) hypertension: Secondary | ICD-10-CM | POA: Diagnosis not present

## 2021-03-27 DIAGNOSIS — I5022 Chronic systolic (congestive) heart failure: Secondary | ICD-10-CM | POA: Diagnosis not present

## 2021-03-27 DIAGNOSIS — I6930 Unspecified sequelae of cerebral infarction: Secondary | ICD-10-CM | POA: Diagnosis not present

## 2021-03-27 DIAGNOSIS — E785 Hyperlipidemia, unspecified: Secondary | ICD-10-CM | POA: Diagnosis not present

## 2021-03-27 DIAGNOSIS — E1140 Type 2 diabetes mellitus with diabetic neuropathy, unspecified: Secondary | ICD-10-CM | POA: Diagnosis not present

## 2021-03-29 ENCOUNTER — Ambulatory Visit: Payer: Medicare Other | Admitting: Sports Medicine

## 2021-03-29 DIAGNOSIS — L03032 Cellulitis of left toe: Secondary | ICD-10-CM | POA: Diagnosis not present

## 2021-03-29 DIAGNOSIS — M4802 Spinal stenosis, cervical region: Secondary | ICD-10-CM | POA: Diagnosis not present

## 2021-03-29 DIAGNOSIS — I4821 Permanent atrial fibrillation: Secondary | ICD-10-CM | POA: Diagnosis not present

## 2021-03-29 DIAGNOSIS — I259 Chronic ischemic heart disease, unspecified: Secondary | ICD-10-CM | POA: Diagnosis not present

## 2021-03-29 DIAGNOSIS — N1831 Chronic kidney disease, stage 3a: Secondary | ICD-10-CM | POA: Diagnosis not present

## 2021-03-29 DIAGNOSIS — E1140 Type 2 diabetes mellitus with diabetic neuropathy, unspecified: Secondary | ICD-10-CM | POA: Diagnosis not present

## 2021-03-29 DIAGNOSIS — I6930 Unspecified sequelae of cerebral infarction: Secondary | ICD-10-CM | POA: Diagnosis not present

## 2021-03-29 DIAGNOSIS — I10 Essential (primary) hypertension: Secondary | ICD-10-CM | POA: Diagnosis not present

## 2021-03-29 DIAGNOSIS — E785 Hyperlipidemia, unspecified: Secondary | ICD-10-CM | POA: Diagnosis not present

## 2021-03-29 DIAGNOSIS — N390 Urinary tract infection, site not specified: Secondary | ICD-10-CM | POA: Diagnosis not present

## 2021-03-29 DIAGNOSIS — I5022 Chronic systolic (congestive) heart failure: Secondary | ICD-10-CM | POA: Diagnosis not present

## 2021-03-29 DIAGNOSIS — M6281 Muscle weakness (generalized): Secondary | ICD-10-CM | POA: Diagnosis not present

## 2021-04-04 DIAGNOSIS — I6930 Unspecified sequelae of cerebral infarction: Secondary | ICD-10-CM | POA: Diagnosis not present

## 2021-04-04 DIAGNOSIS — I4821 Permanent atrial fibrillation: Secondary | ICD-10-CM | POA: Diagnosis not present

## 2021-04-04 DIAGNOSIS — I10 Essential (primary) hypertension: Secondary | ICD-10-CM | POA: Diagnosis not present

## 2021-04-04 DIAGNOSIS — E1140 Type 2 diabetes mellitus with diabetic neuropathy, unspecified: Secondary | ICD-10-CM | POA: Diagnosis not present

## 2021-04-04 DIAGNOSIS — L03032 Cellulitis of left toe: Secondary | ICD-10-CM | POA: Diagnosis not present

## 2021-04-04 DIAGNOSIS — M4802 Spinal stenosis, cervical region: Secondary | ICD-10-CM | POA: Diagnosis not present

## 2021-04-04 DIAGNOSIS — N390 Urinary tract infection, site not specified: Secondary | ICD-10-CM | POA: Diagnosis not present

## 2021-04-04 DIAGNOSIS — I5022 Chronic systolic (congestive) heart failure: Secondary | ICD-10-CM | POA: Diagnosis not present

## 2021-04-04 DIAGNOSIS — E785 Hyperlipidemia, unspecified: Secondary | ICD-10-CM | POA: Diagnosis not present

## 2021-04-04 DIAGNOSIS — M6281 Muscle weakness (generalized): Secondary | ICD-10-CM | POA: Diagnosis not present

## 2021-04-04 DIAGNOSIS — I259 Chronic ischemic heart disease, unspecified: Secondary | ICD-10-CM | POA: Diagnosis not present

## 2021-04-04 DIAGNOSIS — N1831 Chronic kidney disease, stage 3a: Secondary | ICD-10-CM | POA: Diagnosis not present

## 2021-04-05 ENCOUNTER — Encounter (INDEPENDENT_AMBULATORY_CARE_PROVIDER_SITE_OTHER): Payer: Medicare Other | Admitting: Ophthalmology

## 2021-04-05 NOTE — Progress Notes (Signed)
Patient in office today to pick-up diabetic shoes. Patient seen by EJ with [facility] who educated patient on the break-in process. The shoes were tried on and patient was satisfied with the fit. Advised patient to call the office with any questions, comments, or concerns. Patient verbalized understanding.

## 2021-04-06 DIAGNOSIS — E785 Hyperlipidemia, unspecified: Secondary | ICD-10-CM | POA: Diagnosis not present

## 2021-04-06 DIAGNOSIS — E559 Vitamin D deficiency, unspecified: Secondary | ICD-10-CM | POA: Diagnosis not present

## 2021-04-06 DIAGNOSIS — M6281 Muscle weakness (generalized): Secondary | ICD-10-CM | POA: Diagnosis not present

## 2021-04-06 DIAGNOSIS — I5022 Chronic systolic (congestive) heart failure: Secondary | ICD-10-CM | POA: Diagnosis not present

## 2021-04-06 DIAGNOSIS — E1140 Type 2 diabetes mellitus with diabetic neuropathy, unspecified: Secondary | ICD-10-CM | POA: Diagnosis not present

## 2021-04-06 DIAGNOSIS — M1A00X0 Idiopathic chronic gout, unspecified site, without tophus (tophi): Secondary | ICD-10-CM | POA: Diagnosis not present

## 2021-04-06 DIAGNOSIS — L03032 Cellulitis of left toe: Secondary | ICD-10-CM | POA: Diagnosis not present

## 2021-04-06 DIAGNOSIS — J449 Chronic obstructive pulmonary disease, unspecified: Secondary | ICD-10-CM | POA: Diagnosis not present

## 2021-04-06 DIAGNOSIS — N1831 Chronic kidney disease, stage 3a: Secondary | ICD-10-CM | POA: Diagnosis not present

## 2021-04-06 DIAGNOSIS — N390 Urinary tract infection, site not specified: Secondary | ICD-10-CM | POA: Diagnosis not present

## 2021-04-06 DIAGNOSIS — I6930 Unspecified sequelae of cerebral infarction: Secondary | ICD-10-CM | POA: Diagnosis not present

## 2021-04-06 DIAGNOSIS — I4821 Permanent atrial fibrillation: Secondary | ICD-10-CM | POA: Diagnosis not present

## 2021-04-13 DIAGNOSIS — E1140 Type 2 diabetes mellitus with diabetic neuropathy, unspecified: Secondary | ICD-10-CM | POA: Diagnosis not present

## 2021-04-13 DIAGNOSIS — E785 Hyperlipidemia, unspecified: Secondary | ICD-10-CM | POA: Diagnosis not present

## 2021-04-13 DIAGNOSIS — I4821 Permanent atrial fibrillation: Secondary | ICD-10-CM | POA: Diagnosis not present

## 2021-04-13 DIAGNOSIS — N1832 Chronic kidney disease, stage 3b: Secondary | ICD-10-CM | POA: Diagnosis not present

## 2021-04-13 DIAGNOSIS — M1A00X0 Idiopathic chronic gout, unspecified site, without tophus (tophi): Secondary | ICD-10-CM | POA: Diagnosis not present

## 2021-04-13 DIAGNOSIS — I130 Hypertensive heart and chronic kidney disease with heart failure and stage 1 through stage 4 chronic kidney disease, or unspecified chronic kidney disease: Secondary | ICD-10-CM | POA: Diagnosis not present

## 2021-04-13 DIAGNOSIS — N390 Urinary tract infection, site not specified: Secondary | ICD-10-CM | POA: Diagnosis not present

## 2021-04-13 DIAGNOSIS — I6930 Unspecified sequelae of cerebral infarction: Secondary | ICD-10-CM | POA: Diagnosis not present

## 2021-04-13 DIAGNOSIS — I5022 Chronic systolic (congestive) heart failure: Secondary | ICD-10-CM | POA: Diagnosis not present

## 2021-04-13 DIAGNOSIS — E1159 Type 2 diabetes mellitus with other circulatory complications: Secondary | ICD-10-CM | POA: Diagnosis not present

## 2021-04-13 DIAGNOSIS — J449 Chronic obstructive pulmonary disease, unspecified: Secondary | ICD-10-CM | POA: Diagnosis not present

## 2021-04-13 DIAGNOSIS — I739 Peripheral vascular disease, unspecified: Secondary | ICD-10-CM | POA: Diagnosis not present

## 2021-04-13 DIAGNOSIS — B351 Tinea unguium: Secondary | ICD-10-CM | POA: Diagnosis not present

## 2021-04-17 DIAGNOSIS — E559 Vitamin D deficiency, unspecified: Secondary | ICD-10-CM | POA: Diagnosis not present

## 2021-04-17 DIAGNOSIS — Z9181 History of falling: Secondary | ICD-10-CM | POA: Diagnosis not present

## 2021-04-17 DIAGNOSIS — E669 Obesity, unspecified: Secondary | ICD-10-CM | POA: Diagnosis not present

## 2021-04-17 DIAGNOSIS — M1A00X0 Idiopathic chronic gout, unspecified site, without tophus (tophi): Secondary | ICD-10-CM | POA: Diagnosis not present

## 2021-04-17 DIAGNOSIS — E1140 Type 2 diabetes mellitus with diabetic neuropathy, unspecified: Secondary | ICD-10-CM | POA: Diagnosis not present

## 2021-04-17 DIAGNOSIS — I2510 Atherosclerotic heart disease of native coronary artery without angina pectoris: Secondary | ICD-10-CM | POA: Diagnosis not present

## 2021-04-17 DIAGNOSIS — M25562 Pain in left knee: Secondary | ICD-10-CM | POA: Diagnosis not present

## 2021-04-17 DIAGNOSIS — Z7901 Long term (current) use of anticoagulants: Secondary | ICD-10-CM | POA: Diagnosis not present

## 2021-04-17 DIAGNOSIS — I4821 Permanent atrial fibrillation: Secondary | ICD-10-CM | POA: Diagnosis not present

## 2021-04-17 DIAGNOSIS — Z7984 Long term (current) use of oral hypoglycemic drugs: Secondary | ICD-10-CM | POA: Diagnosis not present

## 2021-04-17 DIAGNOSIS — R32 Unspecified urinary incontinence: Secondary | ICD-10-CM | POA: Diagnosis not present

## 2021-04-17 DIAGNOSIS — G8929 Other chronic pain: Secondary | ICD-10-CM | POA: Diagnosis not present

## 2021-04-17 DIAGNOSIS — M4800 Spinal stenosis, site unspecified: Secondary | ICD-10-CM | POA: Diagnosis not present

## 2021-04-17 DIAGNOSIS — I130 Hypertensive heart and chronic kidney disease with heart failure and stage 1 through stage 4 chronic kidney disease, or unspecified chronic kidney disease: Secondary | ICD-10-CM | POA: Diagnosis not present

## 2021-04-17 DIAGNOSIS — E785 Hyperlipidemia, unspecified: Secondary | ICD-10-CM | POA: Diagnosis not present

## 2021-04-17 DIAGNOSIS — N10 Acute tubulo-interstitial nephritis: Secondary | ICD-10-CM | POA: Diagnosis not present

## 2021-04-17 DIAGNOSIS — Z8673 Personal history of transient ischemic attack (TIA), and cerebral infarction without residual deficits: Secondary | ICD-10-CM | POA: Diagnosis not present

## 2021-04-17 DIAGNOSIS — I5023 Acute on chronic systolic (congestive) heart failure: Secondary | ICD-10-CM | POA: Diagnosis not present

## 2021-04-17 DIAGNOSIS — N1832 Chronic kidney disease, stage 3b: Secondary | ICD-10-CM | POA: Diagnosis not present

## 2021-04-17 DIAGNOSIS — J449 Chronic obstructive pulmonary disease, unspecified: Secondary | ICD-10-CM | POA: Diagnosis not present

## 2021-04-17 DIAGNOSIS — G4733 Obstructive sleep apnea (adult) (pediatric): Secondary | ICD-10-CM | POA: Diagnosis not present

## 2021-04-17 DIAGNOSIS — Z6831 Body mass index (BMI) 31.0-31.9, adult: Secondary | ICD-10-CM | POA: Diagnosis not present

## 2021-04-17 DIAGNOSIS — H8110 Benign paroxysmal vertigo, unspecified ear: Secondary | ICD-10-CM | POA: Diagnosis not present

## 2021-04-17 DIAGNOSIS — M1990 Unspecified osteoarthritis, unspecified site: Secondary | ICD-10-CM | POA: Diagnosis not present

## 2021-04-17 DIAGNOSIS — E1122 Type 2 diabetes mellitus with diabetic chronic kidney disease: Secondary | ICD-10-CM | POA: Diagnosis not present

## 2021-04-18 ENCOUNTER — Ambulatory Visit (INDEPENDENT_AMBULATORY_CARE_PROVIDER_SITE_OTHER): Payer: Medicare Other | Admitting: Internal Medicine

## 2021-04-18 DIAGNOSIS — Z79899 Other long term (current) drug therapy: Secondary | ICD-10-CM | POA: Diagnosis not present

## 2021-04-18 DIAGNOSIS — I480 Paroxysmal atrial fibrillation: Secondary | ICD-10-CM | POA: Diagnosis not present

## 2021-04-18 DIAGNOSIS — I482 Chronic atrial fibrillation, unspecified: Secondary | ICD-10-CM | POA: Diagnosis not present

## 2021-04-18 DIAGNOSIS — Z7901 Long term (current) use of anticoagulants: Secondary | ICD-10-CM | POA: Diagnosis not present

## 2021-04-18 DIAGNOSIS — I130 Hypertensive heart and chronic kidney disease with heart failure and stage 1 through stage 4 chronic kidney disease, or unspecified chronic kidney disease: Secondary | ICD-10-CM | POA: Diagnosis not present

## 2021-04-18 DIAGNOSIS — I10 Essential (primary) hypertension: Secondary | ICD-10-CM | POA: Diagnosis not present

## 2021-04-18 DIAGNOSIS — I4891 Unspecified atrial fibrillation: Secondary | ICD-10-CM | POA: Diagnosis not present

## 2021-04-18 DIAGNOSIS — Z09 Encounter for follow-up examination after completed treatment for conditions other than malignant neoplasm: Secondary | ICD-10-CM | POA: Diagnosis not present

## 2021-04-18 DIAGNOSIS — E1122 Type 2 diabetes mellitus with diabetic chronic kidney disease: Secondary | ICD-10-CM | POA: Diagnosis not present

## 2021-04-18 DIAGNOSIS — Z7984 Long term (current) use of oral hypoglycemic drugs: Secondary | ICD-10-CM | POA: Diagnosis not present

## 2021-04-18 DIAGNOSIS — N12 Tubulo-interstitial nephritis, not specified as acute or chronic: Secondary | ICD-10-CM | POA: Diagnosis not present

## 2021-04-18 DIAGNOSIS — Z87440 Personal history of urinary (tract) infections: Secondary | ICD-10-CM | POA: Diagnosis not present

## 2021-04-18 DIAGNOSIS — I5022 Chronic systolic (congestive) heart failure: Secondary | ICD-10-CM | POA: Diagnosis not present

## 2021-04-18 DIAGNOSIS — N183 Chronic kidney disease, stage 3 unspecified: Secondary | ICD-10-CM | POA: Diagnosis not present

## 2021-04-18 LAB — POCT INR: INR: 1.7 — AB (ref 2.0–3.0)

## 2021-04-19 DIAGNOSIS — I2510 Atherosclerotic heart disease of native coronary artery without angina pectoris: Secondary | ICD-10-CM | POA: Diagnosis not present

## 2021-04-19 DIAGNOSIS — I130 Hypertensive heart and chronic kidney disease with heart failure and stage 1 through stage 4 chronic kidney disease, or unspecified chronic kidney disease: Secondary | ICD-10-CM | POA: Diagnosis not present

## 2021-04-19 DIAGNOSIS — N1832 Chronic kidney disease, stage 3b: Secondary | ICD-10-CM | POA: Diagnosis not present

## 2021-04-19 DIAGNOSIS — N10 Acute tubulo-interstitial nephritis: Secondary | ICD-10-CM | POA: Diagnosis not present

## 2021-04-19 DIAGNOSIS — E1122 Type 2 diabetes mellitus with diabetic chronic kidney disease: Secondary | ICD-10-CM | POA: Diagnosis not present

## 2021-04-19 DIAGNOSIS — I5023 Acute on chronic systolic (congestive) heart failure: Secondary | ICD-10-CM | POA: Diagnosis not present

## 2021-04-20 DIAGNOSIS — N10 Acute tubulo-interstitial nephritis: Secondary | ICD-10-CM | POA: Diagnosis not present

## 2021-04-20 DIAGNOSIS — I2510 Atherosclerotic heart disease of native coronary artery without angina pectoris: Secondary | ICD-10-CM | POA: Diagnosis not present

## 2021-04-20 DIAGNOSIS — E1122 Type 2 diabetes mellitus with diabetic chronic kidney disease: Secondary | ICD-10-CM | POA: Diagnosis not present

## 2021-04-20 DIAGNOSIS — N1832 Chronic kidney disease, stage 3b: Secondary | ICD-10-CM | POA: Diagnosis not present

## 2021-04-20 DIAGNOSIS — I5023 Acute on chronic systolic (congestive) heart failure: Secondary | ICD-10-CM | POA: Diagnosis not present

## 2021-04-20 DIAGNOSIS — I130 Hypertensive heart and chronic kidney disease with heart failure and stage 1 through stage 4 chronic kidney disease, or unspecified chronic kidney disease: Secondary | ICD-10-CM | POA: Diagnosis not present

## 2021-04-25 DIAGNOSIS — I2510 Atherosclerotic heart disease of native coronary artery without angina pectoris: Secondary | ICD-10-CM | POA: Diagnosis not present

## 2021-04-25 DIAGNOSIS — N10 Acute tubulo-interstitial nephritis: Secondary | ICD-10-CM | POA: Diagnosis not present

## 2021-04-25 DIAGNOSIS — I5023 Acute on chronic systolic (congestive) heart failure: Secondary | ICD-10-CM | POA: Diagnosis not present

## 2021-04-25 DIAGNOSIS — I130 Hypertensive heart and chronic kidney disease with heart failure and stage 1 through stage 4 chronic kidney disease, or unspecified chronic kidney disease: Secondary | ICD-10-CM | POA: Diagnosis not present

## 2021-04-25 DIAGNOSIS — E1122 Type 2 diabetes mellitus with diabetic chronic kidney disease: Secondary | ICD-10-CM | POA: Diagnosis not present

## 2021-04-25 DIAGNOSIS — N1832 Chronic kidney disease, stage 3b: Secondary | ICD-10-CM | POA: Diagnosis not present

## 2021-04-25 LAB — POCT INR: INR: 4.9 — AB (ref 2.0–3.0)

## 2021-04-26 ENCOUNTER — Ambulatory Visit (INDEPENDENT_AMBULATORY_CARE_PROVIDER_SITE_OTHER): Payer: Medicare Other | Admitting: Cardiology

## 2021-04-26 DIAGNOSIS — I482 Chronic atrial fibrillation, unspecified: Secondary | ICD-10-CM | POA: Diagnosis not present

## 2021-04-26 DIAGNOSIS — Z7901 Long term (current) use of anticoagulants: Secondary | ICD-10-CM | POA: Diagnosis not present

## 2021-04-26 DIAGNOSIS — Z5181 Encounter for therapeutic drug level monitoring: Secondary | ICD-10-CM

## 2021-04-28 ENCOUNTER — Ambulatory Visit: Payer: Medicare Other | Admitting: Sports Medicine

## 2021-04-28 DIAGNOSIS — N10 Acute tubulo-interstitial nephritis: Secondary | ICD-10-CM | POA: Diagnosis not present

## 2021-04-28 DIAGNOSIS — I130 Hypertensive heart and chronic kidney disease with heart failure and stage 1 through stage 4 chronic kidney disease, or unspecified chronic kidney disease: Secondary | ICD-10-CM | POA: Diagnosis not present

## 2021-04-28 DIAGNOSIS — I2510 Atherosclerotic heart disease of native coronary artery without angina pectoris: Secondary | ICD-10-CM | POA: Diagnosis not present

## 2021-04-28 DIAGNOSIS — N1832 Chronic kidney disease, stage 3b: Secondary | ICD-10-CM | POA: Diagnosis not present

## 2021-04-28 DIAGNOSIS — I5023 Acute on chronic systolic (congestive) heart failure: Secondary | ICD-10-CM | POA: Diagnosis not present

## 2021-04-28 DIAGNOSIS — E1122 Type 2 diabetes mellitus with diabetic chronic kidney disease: Secondary | ICD-10-CM | POA: Diagnosis not present

## 2021-04-28 DIAGNOSIS — I509 Heart failure, unspecified: Secondary | ICD-10-CM | POA: Diagnosis not present

## 2021-05-02 DIAGNOSIS — I5023 Acute on chronic systolic (congestive) heart failure: Secondary | ICD-10-CM | POA: Diagnosis not present

## 2021-05-02 DIAGNOSIS — I130 Hypertensive heart and chronic kidney disease with heart failure and stage 1 through stage 4 chronic kidney disease, or unspecified chronic kidney disease: Secondary | ICD-10-CM | POA: Diagnosis not present

## 2021-05-02 DIAGNOSIS — I2510 Atherosclerotic heart disease of native coronary artery without angina pectoris: Secondary | ICD-10-CM | POA: Diagnosis not present

## 2021-05-02 DIAGNOSIS — N10 Acute tubulo-interstitial nephritis: Secondary | ICD-10-CM | POA: Diagnosis not present

## 2021-05-02 DIAGNOSIS — E1122 Type 2 diabetes mellitus with diabetic chronic kidney disease: Secondary | ICD-10-CM | POA: Diagnosis not present

## 2021-05-02 DIAGNOSIS — N1832 Chronic kidney disease, stage 3b: Secondary | ICD-10-CM | POA: Diagnosis not present

## 2021-05-04 ENCOUNTER — Telehealth: Payer: Self-pay

## 2021-05-04 DIAGNOSIS — I130 Hypertensive heart and chronic kidney disease with heart failure and stage 1 through stage 4 chronic kidney disease, or unspecified chronic kidney disease: Secondary | ICD-10-CM | POA: Diagnosis not present

## 2021-05-04 DIAGNOSIS — I5023 Acute on chronic systolic (congestive) heart failure: Secondary | ICD-10-CM | POA: Diagnosis not present

## 2021-05-04 DIAGNOSIS — N1832 Chronic kidney disease, stage 3b: Secondary | ICD-10-CM | POA: Diagnosis not present

## 2021-05-04 DIAGNOSIS — N10 Acute tubulo-interstitial nephritis: Secondary | ICD-10-CM | POA: Diagnosis not present

## 2021-05-04 DIAGNOSIS — I2510 Atherosclerotic heart disease of native coronary artery without angina pectoris: Secondary | ICD-10-CM | POA: Diagnosis not present

## 2021-05-04 DIAGNOSIS — E1122 Type 2 diabetes mellitus with diabetic chronic kidney disease: Secondary | ICD-10-CM | POA: Diagnosis not present

## 2021-05-04 NOTE — Telephone Encounter (Signed)
Overdue warfarin self tester lmom

## 2021-05-07 LAB — POCT INR: INR: 1.9 — AB (ref 2.0–3.0)

## 2021-05-08 ENCOUNTER — Ambulatory Visit (INDEPENDENT_AMBULATORY_CARE_PROVIDER_SITE_OTHER): Payer: Medicare Other | Admitting: Cardiovascular Disease

## 2021-05-08 DIAGNOSIS — Z5181 Encounter for therapeutic drug level monitoring: Secondary | ICD-10-CM

## 2021-05-08 DIAGNOSIS — I482 Chronic atrial fibrillation, unspecified: Secondary | ICD-10-CM

## 2021-05-08 DIAGNOSIS — Z7901 Long term (current) use of anticoagulants: Secondary | ICD-10-CM | POA: Diagnosis not present

## 2021-05-09 DIAGNOSIS — I2510 Atherosclerotic heart disease of native coronary artery without angina pectoris: Secondary | ICD-10-CM | POA: Diagnosis not present

## 2021-05-09 DIAGNOSIS — I5023 Acute on chronic systolic (congestive) heart failure: Secondary | ICD-10-CM | POA: Diagnosis not present

## 2021-05-09 DIAGNOSIS — N1832 Chronic kidney disease, stage 3b: Secondary | ICD-10-CM | POA: Diagnosis not present

## 2021-05-09 DIAGNOSIS — I130 Hypertensive heart and chronic kidney disease with heart failure and stage 1 through stage 4 chronic kidney disease, or unspecified chronic kidney disease: Secondary | ICD-10-CM | POA: Diagnosis not present

## 2021-05-09 DIAGNOSIS — E1122 Type 2 diabetes mellitus with diabetic chronic kidney disease: Secondary | ICD-10-CM | POA: Diagnosis not present

## 2021-05-09 DIAGNOSIS — N10 Acute tubulo-interstitial nephritis: Secondary | ICD-10-CM | POA: Diagnosis not present

## 2021-05-10 DIAGNOSIS — N1832 Chronic kidney disease, stage 3b: Secondary | ICD-10-CM | POA: Diagnosis not present

## 2021-05-10 DIAGNOSIS — N10 Acute tubulo-interstitial nephritis: Secondary | ICD-10-CM | POA: Diagnosis not present

## 2021-05-10 DIAGNOSIS — I2510 Atherosclerotic heart disease of native coronary artery without angina pectoris: Secondary | ICD-10-CM | POA: Diagnosis not present

## 2021-05-10 DIAGNOSIS — E1122 Type 2 diabetes mellitus with diabetic chronic kidney disease: Secondary | ICD-10-CM | POA: Diagnosis not present

## 2021-05-10 DIAGNOSIS — I5023 Acute on chronic systolic (congestive) heart failure: Secondary | ICD-10-CM | POA: Diagnosis not present

## 2021-05-10 DIAGNOSIS — I130 Hypertensive heart and chronic kidney disease with heart failure and stage 1 through stage 4 chronic kidney disease, or unspecified chronic kidney disease: Secondary | ICD-10-CM | POA: Diagnosis not present

## 2021-05-11 DIAGNOSIS — I2510 Atherosclerotic heart disease of native coronary artery without angina pectoris: Secondary | ICD-10-CM | POA: Diagnosis not present

## 2021-05-11 DIAGNOSIS — I130 Hypertensive heart and chronic kidney disease with heart failure and stage 1 through stage 4 chronic kidney disease, or unspecified chronic kidney disease: Secondary | ICD-10-CM | POA: Diagnosis not present

## 2021-05-11 DIAGNOSIS — I5023 Acute on chronic systolic (congestive) heart failure: Secondary | ICD-10-CM | POA: Diagnosis not present

## 2021-05-11 DIAGNOSIS — N1832 Chronic kidney disease, stage 3b: Secondary | ICD-10-CM | POA: Diagnosis not present

## 2021-05-11 DIAGNOSIS — N10 Acute tubulo-interstitial nephritis: Secondary | ICD-10-CM | POA: Diagnosis not present

## 2021-05-11 DIAGNOSIS — E1122 Type 2 diabetes mellitus with diabetic chronic kidney disease: Secondary | ICD-10-CM | POA: Diagnosis not present

## 2021-05-12 DIAGNOSIS — I5033 Acute on chronic diastolic (congestive) heart failure: Secondary | ICD-10-CM | POA: Diagnosis not present

## 2021-05-12 DIAGNOSIS — J449 Chronic obstructive pulmonary disease, unspecified: Secondary | ICD-10-CM | POA: Diagnosis not present

## 2021-05-12 DIAGNOSIS — I428 Other cardiomyopathies: Secondary | ICD-10-CM | POA: Diagnosis not present

## 2021-05-12 DIAGNOSIS — E119 Type 2 diabetes mellitus without complications: Secondary | ICD-10-CM | POA: Diagnosis not present

## 2021-05-12 DIAGNOSIS — I482 Chronic atrial fibrillation, unspecified: Secondary | ICD-10-CM | POA: Diagnosis not present

## 2021-05-15 ENCOUNTER — Telehealth: Payer: Self-pay

## 2021-05-15 NOTE — Telephone Encounter (Signed)
Warfarin overdue tester lmom

## 2021-05-16 DIAGNOSIS — I5023 Acute on chronic systolic (congestive) heart failure: Secondary | ICD-10-CM | POA: Diagnosis not present

## 2021-05-16 DIAGNOSIS — N10 Acute tubulo-interstitial nephritis: Secondary | ICD-10-CM | POA: Diagnosis not present

## 2021-05-16 DIAGNOSIS — I2510 Atherosclerotic heart disease of native coronary artery without angina pectoris: Secondary | ICD-10-CM | POA: Diagnosis not present

## 2021-05-16 DIAGNOSIS — I130 Hypertensive heart and chronic kidney disease with heart failure and stage 1 through stage 4 chronic kidney disease, or unspecified chronic kidney disease: Secondary | ICD-10-CM | POA: Diagnosis not present

## 2021-05-16 DIAGNOSIS — N1832 Chronic kidney disease, stage 3b: Secondary | ICD-10-CM | POA: Diagnosis not present

## 2021-05-16 DIAGNOSIS — Z7901 Long term (current) use of anticoagulants: Secondary | ICD-10-CM | POA: Diagnosis not present

## 2021-05-16 DIAGNOSIS — E1122 Type 2 diabetes mellitus with diabetic chronic kidney disease: Secondary | ICD-10-CM | POA: Diagnosis not present

## 2021-05-16 LAB — POCT INR: INR: 2.3 (ref 2.0–3.0)

## 2021-05-17 ENCOUNTER — Ambulatory Visit (INDEPENDENT_AMBULATORY_CARE_PROVIDER_SITE_OTHER): Payer: Medicare Other | Admitting: Cardiology

## 2021-05-17 DIAGNOSIS — E785 Hyperlipidemia, unspecified: Secondary | ICD-10-CM | POA: Diagnosis not present

## 2021-05-17 DIAGNOSIS — Z7901 Long term (current) use of anticoagulants: Secondary | ICD-10-CM | POA: Diagnosis not present

## 2021-05-17 DIAGNOSIS — N10 Acute tubulo-interstitial nephritis: Secondary | ICD-10-CM | POA: Diagnosis not present

## 2021-05-17 DIAGNOSIS — Z5181 Encounter for therapeutic drug level monitoring: Secondary | ICD-10-CM | POA: Diagnosis not present

## 2021-05-17 DIAGNOSIS — E1140 Type 2 diabetes mellitus with diabetic neuropathy, unspecified: Secondary | ICD-10-CM | POA: Diagnosis not present

## 2021-05-17 DIAGNOSIS — M4800 Spinal stenosis, site unspecified: Secondary | ICD-10-CM | POA: Diagnosis not present

## 2021-05-17 DIAGNOSIS — Z6831 Body mass index (BMI) 31.0-31.9, adult: Secondary | ICD-10-CM | POA: Diagnosis not present

## 2021-05-17 DIAGNOSIS — I4821 Permanent atrial fibrillation: Secondary | ICD-10-CM | POA: Diagnosis not present

## 2021-05-17 DIAGNOSIS — I482 Chronic atrial fibrillation, unspecified: Secondary | ICD-10-CM | POA: Diagnosis not present

## 2021-05-17 DIAGNOSIS — M1990 Unspecified osteoarthritis, unspecified site: Secondary | ICD-10-CM | POA: Diagnosis not present

## 2021-05-17 DIAGNOSIS — E669 Obesity, unspecified: Secondary | ICD-10-CM | POA: Diagnosis not present

## 2021-05-17 DIAGNOSIS — Z9181 History of falling: Secondary | ICD-10-CM | POA: Diagnosis not present

## 2021-05-17 DIAGNOSIS — E559 Vitamin D deficiency, unspecified: Secondary | ICD-10-CM | POA: Diagnosis not present

## 2021-05-17 DIAGNOSIS — H8110 Benign paroxysmal vertigo, unspecified ear: Secondary | ICD-10-CM | POA: Diagnosis not present

## 2021-05-17 DIAGNOSIS — J449 Chronic obstructive pulmonary disease, unspecified: Secondary | ICD-10-CM | POA: Diagnosis not present

## 2021-05-17 DIAGNOSIS — Z8673 Personal history of transient ischemic attack (TIA), and cerebral infarction without residual deficits: Secondary | ICD-10-CM | POA: Diagnosis not present

## 2021-05-17 DIAGNOSIS — N1832 Chronic kidney disease, stage 3b: Secondary | ICD-10-CM | POA: Diagnosis not present

## 2021-05-17 DIAGNOSIS — M25562 Pain in left knee: Secondary | ICD-10-CM | POA: Diagnosis not present

## 2021-05-17 DIAGNOSIS — Z7984 Long term (current) use of oral hypoglycemic drugs: Secondary | ICD-10-CM | POA: Diagnosis not present

## 2021-05-17 DIAGNOSIS — I5023 Acute on chronic systolic (congestive) heart failure: Secondary | ICD-10-CM | POA: Diagnosis not present

## 2021-05-17 DIAGNOSIS — M1A00X0 Idiopathic chronic gout, unspecified site, without tophus (tophi): Secondary | ICD-10-CM | POA: Diagnosis not present

## 2021-05-17 DIAGNOSIS — R32 Unspecified urinary incontinence: Secondary | ICD-10-CM | POA: Diagnosis not present

## 2021-05-17 DIAGNOSIS — G8929 Other chronic pain: Secondary | ICD-10-CM | POA: Diagnosis not present

## 2021-05-17 DIAGNOSIS — G4733 Obstructive sleep apnea (adult) (pediatric): Secondary | ICD-10-CM | POA: Diagnosis not present

## 2021-05-17 DIAGNOSIS — I130 Hypertensive heart and chronic kidney disease with heart failure and stage 1 through stage 4 chronic kidney disease, or unspecified chronic kidney disease: Secondary | ICD-10-CM | POA: Diagnosis not present

## 2021-05-17 DIAGNOSIS — E1122 Type 2 diabetes mellitus with diabetic chronic kidney disease: Secondary | ICD-10-CM | POA: Diagnosis not present

## 2021-05-17 DIAGNOSIS — I2510 Atherosclerotic heart disease of native coronary artery without angina pectoris: Secondary | ICD-10-CM | POA: Diagnosis not present

## 2021-05-18 DIAGNOSIS — M25562 Pain in left knee: Secondary | ICD-10-CM | POA: Diagnosis not present

## 2021-05-18 DIAGNOSIS — M1712 Unilateral primary osteoarthritis, left knee: Secondary | ICD-10-CM | POA: Diagnosis not present

## 2021-05-22 DIAGNOSIS — I5023 Acute on chronic systolic (congestive) heart failure: Secondary | ICD-10-CM | POA: Diagnosis not present

## 2021-05-22 DIAGNOSIS — N10 Acute tubulo-interstitial nephritis: Secondary | ICD-10-CM | POA: Diagnosis not present

## 2021-05-22 DIAGNOSIS — I130 Hypertensive heart and chronic kidney disease with heart failure and stage 1 through stage 4 chronic kidney disease, or unspecified chronic kidney disease: Secondary | ICD-10-CM | POA: Diagnosis not present

## 2021-05-22 DIAGNOSIS — E1122 Type 2 diabetes mellitus with diabetic chronic kidney disease: Secondary | ICD-10-CM | POA: Diagnosis not present

## 2021-05-22 DIAGNOSIS — I2510 Atherosclerotic heart disease of native coronary artery without angina pectoris: Secondary | ICD-10-CM | POA: Diagnosis not present

## 2021-05-22 DIAGNOSIS — N1832 Chronic kidney disease, stage 3b: Secondary | ICD-10-CM | POA: Diagnosis not present

## 2021-05-24 ENCOUNTER — Telehealth: Payer: Self-pay

## 2021-05-24 NOTE — Telephone Encounter (Signed)
Lmom for overdue inr

## 2021-05-25 ENCOUNTER — Ambulatory Visit (INDEPENDENT_AMBULATORY_CARE_PROVIDER_SITE_OTHER): Payer: Medicare Other | Admitting: Cardiology

## 2021-05-25 DIAGNOSIS — I482 Chronic atrial fibrillation, unspecified: Secondary | ICD-10-CM | POA: Diagnosis not present

## 2021-05-25 DIAGNOSIS — Z7901 Long term (current) use of anticoagulants: Secondary | ICD-10-CM

## 2021-05-25 LAB — POCT INR: INR: 2.6 (ref 2.0–3.0)

## 2021-05-25 NOTE — Patient Instructions (Signed)
Description   Continue 83m daily. Recheck 1 week self tester.

## 2021-05-31 DIAGNOSIS — N10 Acute tubulo-interstitial nephritis: Secondary | ICD-10-CM | POA: Diagnosis not present

## 2021-05-31 DIAGNOSIS — N390 Urinary tract infection, site not specified: Secondary | ICD-10-CM | POA: Diagnosis not present

## 2021-05-31 DIAGNOSIS — I5023 Acute on chronic systolic (congestive) heart failure: Secondary | ICD-10-CM | POA: Diagnosis not present

## 2021-05-31 DIAGNOSIS — E1122 Type 2 diabetes mellitus with diabetic chronic kidney disease: Secondary | ICD-10-CM | POA: Diagnosis not present

## 2021-05-31 DIAGNOSIS — N1832 Chronic kidney disease, stage 3b: Secondary | ICD-10-CM | POA: Diagnosis not present

## 2021-05-31 DIAGNOSIS — I2510 Atherosclerotic heart disease of native coronary artery without angina pectoris: Secondary | ICD-10-CM | POA: Diagnosis not present

## 2021-05-31 DIAGNOSIS — I130 Hypertensive heart and chronic kidney disease with heart failure and stage 1 through stage 4 chronic kidney disease, or unspecified chronic kidney disease: Secondary | ICD-10-CM | POA: Diagnosis not present

## 2021-06-01 ENCOUNTER — Telehealth: Payer: Self-pay

## 2021-06-01 NOTE — Telephone Encounter (Signed)
Lmom for overdue inr

## 2021-06-02 ENCOUNTER — Ambulatory Visit (INDEPENDENT_AMBULATORY_CARE_PROVIDER_SITE_OTHER): Payer: Medicare Other | Admitting: Cardiovascular Disease

## 2021-06-02 ENCOUNTER — Telehealth: Payer: Self-pay

## 2021-06-02 DIAGNOSIS — Z7901 Long term (current) use of anticoagulants: Secondary | ICD-10-CM

## 2021-06-02 DIAGNOSIS — Z5181 Encounter for therapeutic drug level monitoring: Secondary | ICD-10-CM

## 2021-06-02 DIAGNOSIS — I482 Chronic atrial fibrillation, unspecified: Secondary | ICD-10-CM | POA: Diagnosis not present

## 2021-06-02 LAB — POCT INR: INR: 3.2 — AB (ref 2.0–3.0)

## 2021-06-02 NOTE — Telephone Encounter (Signed)
Lmom for overdue inr 

## 2021-06-07 DIAGNOSIS — I2510 Atherosclerotic heart disease of native coronary artery without angina pectoris: Secondary | ICD-10-CM | POA: Diagnosis not present

## 2021-06-07 DIAGNOSIS — E1122 Type 2 diabetes mellitus with diabetic chronic kidney disease: Secondary | ICD-10-CM | POA: Diagnosis not present

## 2021-06-07 DIAGNOSIS — N1832 Chronic kidney disease, stage 3b: Secondary | ICD-10-CM | POA: Diagnosis not present

## 2021-06-07 DIAGNOSIS — I5023 Acute on chronic systolic (congestive) heart failure: Secondary | ICD-10-CM | POA: Diagnosis not present

## 2021-06-07 DIAGNOSIS — I130 Hypertensive heart and chronic kidney disease with heart failure and stage 1 through stage 4 chronic kidney disease, or unspecified chronic kidney disease: Secondary | ICD-10-CM | POA: Diagnosis not present

## 2021-06-07 DIAGNOSIS — N10 Acute tubulo-interstitial nephritis: Secondary | ICD-10-CM | POA: Diagnosis not present

## 2021-06-09 ENCOUNTER — Ambulatory Visit (INDEPENDENT_AMBULATORY_CARE_PROVIDER_SITE_OTHER): Payer: Medicare Other | Admitting: Cardiovascular Disease

## 2021-06-09 ENCOUNTER — Telehealth: Payer: Self-pay

## 2021-06-09 DIAGNOSIS — I482 Chronic atrial fibrillation, unspecified: Secondary | ICD-10-CM

## 2021-06-09 DIAGNOSIS — Z7901 Long term (current) use of anticoagulants: Secondary | ICD-10-CM

## 2021-06-09 DIAGNOSIS — Z5181 Encounter for therapeutic drug level monitoring: Secondary | ICD-10-CM | POA: Diagnosis not present

## 2021-06-09 LAB — POCT INR: INR: 2.3 (ref 2.0–3.0)

## 2021-06-09 NOTE — Telephone Encounter (Signed)
Lmom for inr due today  

## 2021-06-09 NOTE — Patient Instructions (Signed)
Description    Continue taking warfarin 69m daily. Recheck 1 week self tester.

## 2021-06-13 DIAGNOSIS — I130 Hypertensive heart and chronic kidney disease with heart failure and stage 1 through stage 4 chronic kidney disease, or unspecified chronic kidney disease: Secondary | ICD-10-CM | POA: Diagnosis not present

## 2021-06-13 DIAGNOSIS — N10 Acute tubulo-interstitial nephritis: Secondary | ICD-10-CM | POA: Diagnosis not present

## 2021-06-13 DIAGNOSIS — I2510 Atherosclerotic heart disease of native coronary artery without angina pectoris: Secondary | ICD-10-CM | POA: Diagnosis not present

## 2021-06-13 DIAGNOSIS — E1122 Type 2 diabetes mellitus with diabetic chronic kidney disease: Secondary | ICD-10-CM | POA: Diagnosis not present

## 2021-06-13 DIAGNOSIS — N1832 Chronic kidney disease, stage 3b: Secondary | ICD-10-CM | POA: Diagnosis not present

## 2021-06-13 DIAGNOSIS — I5023 Acute on chronic systolic (congestive) heart failure: Secondary | ICD-10-CM | POA: Diagnosis not present

## 2021-06-15 DIAGNOSIS — N1831 Chronic kidney disease, stage 3a: Secondary | ICD-10-CM | POA: Diagnosis not present

## 2021-06-15 DIAGNOSIS — I10 Essential (primary) hypertension: Secondary | ICD-10-CM | POA: Diagnosis not present

## 2021-06-15 DIAGNOSIS — R32 Unspecified urinary incontinence: Secondary | ICD-10-CM | POA: Diagnosis not present

## 2021-06-15 DIAGNOSIS — I4891 Unspecified atrial fibrillation: Secondary | ICD-10-CM | POA: Diagnosis not present

## 2021-06-15 DIAGNOSIS — M549 Dorsalgia, unspecified: Secondary | ICD-10-CM | POA: Diagnosis not present

## 2021-06-15 DIAGNOSIS — M1A9XX0 Chronic gout, unspecified, without tophus (tophi): Secondary | ICD-10-CM | POA: Diagnosis not present

## 2021-06-15 DIAGNOSIS — G8929 Other chronic pain: Secondary | ICD-10-CM | POA: Diagnosis not present

## 2021-06-15 DIAGNOSIS — E785 Hyperlipidemia, unspecified: Secondary | ICD-10-CM | POA: Diagnosis not present

## 2021-06-15 DIAGNOSIS — I509 Heart failure, unspecified: Secondary | ICD-10-CM | POA: Diagnosis not present

## 2021-06-16 DIAGNOSIS — Z6831 Body mass index (BMI) 31.0-31.9, adult: Secondary | ICD-10-CM | POA: Diagnosis not present

## 2021-06-16 DIAGNOSIS — G8929 Other chronic pain: Secondary | ICD-10-CM | POA: Diagnosis not present

## 2021-06-16 DIAGNOSIS — M4800 Spinal stenosis, site unspecified: Secondary | ICD-10-CM | POA: Diagnosis not present

## 2021-06-16 DIAGNOSIS — I130 Hypertensive heart and chronic kidney disease with heart failure and stage 1 through stage 4 chronic kidney disease, or unspecified chronic kidney disease: Secondary | ICD-10-CM | POA: Diagnosis not present

## 2021-06-16 DIAGNOSIS — I2510 Atherosclerotic heart disease of native coronary artery without angina pectoris: Secondary | ICD-10-CM | POA: Diagnosis not present

## 2021-06-16 DIAGNOSIS — H8110 Benign paroxysmal vertigo, unspecified ear: Secondary | ICD-10-CM | POA: Diagnosis not present

## 2021-06-16 DIAGNOSIS — E785 Hyperlipidemia, unspecified: Secondary | ICD-10-CM | POA: Diagnosis not present

## 2021-06-16 DIAGNOSIS — G4733 Obstructive sleep apnea (adult) (pediatric): Secondary | ICD-10-CM | POA: Diagnosis not present

## 2021-06-16 DIAGNOSIS — E1140 Type 2 diabetes mellitus with diabetic neuropathy, unspecified: Secondary | ICD-10-CM | POA: Diagnosis not present

## 2021-06-16 DIAGNOSIS — N1832 Chronic kidney disease, stage 3b: Secondary | ICD-10-CM | POA: Diagnosis not present

## 2021-06-16 DIAGNOSIS — Z87440 Personal history of urinary (tract) infections: Secondary | ICD-10-CM | POA: Diagnosis not present

## 2021-06-16 DIAGNOSIS — E559 Vitamin D deficiency, unspecified: Secondary | ICD-10-CM | POA: Diagnosis not present

## 2021-06-16 DIAGNOSIS — J449 Chronic obstructive pulmonary disease, unspecified: Secondary | ICD-10-CM | POA: Diagnosis not present

## 2021-06-16 DIAGNOSIS — R32 Unspecified urinary incontinence: Secondary | ICD-10-CM | POA: Diagnosis not present

## 2021-06-16 DIAGNOSIS — Z8673 Personal history of transient ischemic attack (TIA), and cerebral infarction without residual deficits: Secondary | ICD-10-CM | POA: Diagnosis not present

## 2021-06-16 DIAGNOSIS — Z7901 Long term (current) use of anticoagulants: Secondary | ICD-10-CM | POA: Diagnosis not present

## 2021-06-16 DIAGNOSIS — E1122 Type 2 diabetes mellitus with diabetic chronic kidney disease: Secondary | ICD-10-CM | POA: Diagnosis not present

## 2021-06-16 DIAGNOSIS — I4821 Permanent atrial fibrillation: Secondary | ICD-10-CM | POA: Diagnosis not present

## 2021-06-16 DIAGNOSIS — Z9181 History of falling: Secondary | ICD-10-CM | POA: Diagnosis not present

## 2021-06-16 DIAGNOSIS — I5023 Acute on chronic systolic (congestive) heart failure: Secondary | ICD-10-CM | POA: Diagnosis not present

## 2021-06-16 DIAGNOSIS — E669 Obesity, unspecified: Secondary | ICD-10-CM | POA: Diagnosis not present

## 2021-06-16 DIAGNOSIS — M1A00X0 Idiopathic chronic gout, unspecified site, without tophus (tophi): Secondary | ICD-10-CM | POA: Diagnosis not present

## 2021-06-16 DIAGNOSIS — Z7984 Long term (current) use of oral hypoglycemic drugs: Secondary | ICD-10-CM | POA: Diagnosis not present

## 2021-06-16 DIAGNOSIS — M1990 Unspecified osteoarthritis, unspecified site: Secondary | ICD-10-CM | POA: Diagnosis not present

## 2021-06-16 DIAGNOSIS — M25562 Pain in left knee: Secondary | ICD-10-CM | POA: Diagnosis not present

## 2021-06-19 ENCOUNTER — Ambulatory Visit (INDEPENDENT_AMBULATORY_CARE_PROVIDER_SITE_OTHER): Payer: Medicare Other | Admitting: Internal Medicine

## 2021-06-19 DIAGNOSIS — Z5181 Encounter for therapeutic drug level monitoring: Secondary | ICD-10-CM | POA: Diagnosis not present

## 2021-06-19 DIAGNOSIS — I482 Chronic atrial fibrillation, unspecified: Secondary | ICD-10-CM

## 2021-06-19 DIAGNOSIS — Z7901 Long term (current) use of anticoagulants: Secondary | ICD-10-CM | POA: Diagnosis not present

## 2021-06-19 LAB — POCT INR: INR: 2.7 (ref 2.0–3.0)

## 2021-06-22 ENCOUNTER — Ambulatory Visit (INDEPENDENT_AMBULATORY_CARE_PROVIDER_SITE_OTHER): Payer: Medicare Other | Admitting: Cardiology

## 2021-06-22 DIAGNOSIS — I482 Chronic atrial fibrillation, unspecified: Secondary | ICD-10-CM

## 2021-06-22 DIAGNOSIS — Z5181 Encounter for therapeutic drug level monitoring: Secondary | ICD-10-CM | POA: Diagnosis not present

## 2021-06-22 DIAGNOSIS — Z7901 Long term (current) use of anticoagulants: Secondary | ICD-10-CM

## 2021-06-22 LAB — POCT INR: INR: 3.1 — AB (ref 2.0–3.0)

## 2021-06-22 NOTE — Patient Instructions (Addendum)
Description   Called and spoke to pt and instructed her to take 1/2 a tablet of warfarin today and then continue to take 1 tablet ( 24m) of warfarin daily. Recheck INR on 9/26. [facility] [phone]

## 2021-06-26 ENCOUNTER — Ambulatory Visit (INDEPENDENT_AMBULATORY_CARE_PROVIDER_SITE_OTHER): Payer: Medicare Other | Admitting: Cardiovascular Disease

## 2021-06-26 DIAGNOSIS — Z7901 Long term (current) use of anticoagulants: Secondary | ICD-10-CM

## 2021-06-26 DIAGNOSIS — Z5181 Encounter for therapeutic drug level monitoring: Secondary | ICD-10-CM

## 2021-06-26 DIAGNOSIS — I482 Chronic atrial fibrillation, unspecified: Secondary | ICD-10-CM

## 2021-06-26 LAB — POCT INR: INR: 3.3 — AB (ref 2.0–3.0)

## 2021-06-28 DIAGNOSIS — N1832 Chronic kidney disease, stage 3b: Secondary | ICD-10-CM | POA: Diagnosis not present

## 2021-06-28 DIAGNOSIS — I5023 Acute on chronic systolic (congestive) heart failure: Secondary | ICD-10-CM | POA: Diagnosis not present

## 2021-06-28 DIAGNOSIS — E1122 Type 2 diabetes mellitus with diabetic chronic kidney disease: Secondary | ICD-10-CM | POA: Diagnosis not present

## 2021-06-28 DIAGNOSIS — I4821 Permanent atrial fibrillation: Secondary | ICD-10-CM | POA: Diagnosis not present

## 2021-06-28 DIAGNOSIS — Z87440 Personal history of urinary (tract) infections: Secondary | ICD-10-CM | POA: Diagnosis not present

## 2021-06-28 DIAGNOSIS — I130 Hypertensive heart and chronic kidney disease with heart failure and stage 1 through stage 4 chronic kidney disease, or unspecified chronic kidney disease: Secondary | ICD-10-CM | POA: Diagnosis not present

## 2021-07-03 LAB — POCT INR: INR: 3.3 — AB (ref 2.0–3.0)

## 2021-07-04 ENCOUNTER — Ambulatory Visit (INDEPENDENT_AMBULATORY_CARE_PROVIDER_SITE_OTHER): Payer: Medicare Other | Admitting: Cardiovascular Disease

## 2021-07-04 DIAGNOSIS — I482 Chronic atrial fibrillation, unspecified: Secondary | ICD-10-CM | POA: Diagnosis not present

## 2021-07-04 DIAGNOSIS — Z5181 Encounter for therapeutic drug level monitoring: Secondary | ICD-10-CM

## 2021-07-04 DIAGNOSIS — Z7901 Long term (current) use of anticoagulants: Secondary | ICD-10-CM

## 2021-07-04 NOTE — Patient Instructions (Signed)
Description   Called and spoke to pt and instructed her to continue to take warfarin 1 tablet (5mg) daily. Recheck INR 1 week. [facility] [phone]       

## 2021-07-10 LAB — POCT INR: INR: 2.1 (ref 2.0–3.0)

## 2021-07-11 ENCOUNTER — Ambulatory Visit (INDEPENDENT_AMBULATORY_CARE_PROVIDER_SITE_OTHER): Payer: Medicare Other | Admitting: Cardiology

## 2021-07-11 DIAGNOSIS — Z5181 Encounter for therapeutic drug level monitoring: Secondary | ICD-10-CM

## 2021-07-11 NOTE — Patient Instructions (Signed)
Description   Called and spoke to pt and instructed her to continue to take warfarin 1 tablet (5mg) daily. Recheck INR 1 week. [facility] [phone]       

## 2021-07-16 DIAGNOSIS — E669 Obesity, unspecified: Secondary | ICD-10-CM | POA: Diagnosis not present

## 2021-07-16 DIAGNOSIS — J449 Chronic obstructive pulmonary disease, unspecified: Secondary | ICD-10-CM | POA: Diagnosis not present

## 2021-07-16 DIAGNOSIS — E1140 Type 2 diabetes mellitus with diabetic neuropathy, unspecified: Secondary | ICD-10-CM | POA: Diagnosis not present

## 2021-07-16 DIAGNOSIS — M4800 Spinal stenosis, site unspecified: Secondary | ICD-10-CM | POA: Diagnosis not present

## 2021-07-16 DIAGNOSIS — E1122 Type 2 diabetes mellitus with diabetic chronic kidney disease: Secondary | ICD-10-CM | POA: Diagnosis not present

## 2021-07-16 DIAGNOSIS — E559 Vitamin D deficiency, unspecified: Secondary | ICD-10-CM | POA: Diagnosis not present

## 2021-07-16 DIAGNOSIS — Z7901 Long term (current) use of anticoagulants: Secondary | ICD-10-CM | POA: Diagnosis not present

## 2021-07-16 DIAGNOSIS — N1832 Chronic kidney disease, stage 3b: Secondary | ICD-10-CM | POA: Diagnosis not present

## 2021-07-16 DIAGNOSIS — M1990 Unspecified osteoarthritis, unspecified site: Secondary | ICD-10-CM | POA: Diagnosis not present

## 2021-07-16 DIAGNOSIS — Z87440 Personal history of urinary (tract) infections: Secondary | ICD-10-CM | POA: Diagnosis not present

## 2021-07-16 DIAGNOSIS — I130 Hypertensive heart and chronic kidney disease with heart failure and stage 1 through stage 4 chronic kidney disease, or unspecified chronic kidney disease: Secondary | ICD-10-CM | POA: Diagnosis not present

## 2021-07-16 DIAGNOSIS — I2510 Atherosclerotic heart disease of native coronary artery without angina pectoris: Secondary | ICD-10-CM | POA: Diagnosis not present

## 2021-07-16 DIAGNOSIS — R32 Unspecified urinary incontinence: Secondary | ICD-10-CM | POA: Diagnosis not present

## 2021-07-16 DIAGNOSIS — Z8673 Personal history of transient ischemic attack (TIA), and cerebral infarction without residual deficits: Secondary | ICD-10-CM | POA: Diagnosis not present

## 2021-07-16 DIAGNOSIS — G4733 Obstructive sleep apnea (adult) (pediatric): Secondary | ICD-10-CM | POA: Diagnosis not present

## 2021-07-16 DIAGNOSIS — I5023 Acute on chronic systolic (congestive) heart failure: Secondary | ICD-10-CM | POA: Diagnosis not present

## 2021-07-16 DIAGNOSIS — I4821 Permanent atrial fibrillation: Secondary | ICD-10-CM | POA: Diagnosis not present

## 2021-07-16 DIAGNOSIS — Z6831 Body mass index (BMI) 31.0-31.9, adult: Secondary | ICD-10-CM | POA: Diagnosis not present

## 2021-07-16 DIAGNOSIS — H8110 Benign paroxysmal vertigo, unspecified ear: Secondary | ICD-10-CM | POA: Diagnosis not present

## 2021-07-16 DIAGNOSIS — M25562 Pain in left knee: Secondary | ICD-10-CM | POA: Diagnosis not present

## 2021-07-16 DIAGNOSIS — G8929 Other chronic pain: Secondary | ICD-10-CM | POA: Diagnosis not present

## 2021-07-16 DIAGNOSIS — E785 Hyperlipidemia, unspecified: Secondary | ICD-10-CM | POA: Diagnosis not present

## 2021-07-16 DIAGNOSIS — Z7984 Long term (current) use of oral hypoglycemic drugs: Secondary | ICD-10-CM | POA: Diagnosis not present

## 2021-07-16 DIAGNOSIS — Z9181 History of falling: Secondary | ICD-10-CM | POA: Diagnosis not present

## 2021-07-16 DIAGNOSIS — M1A00X0 Idiopathic chronic gout, unspecified site, without tophus (tophi): Secondary | ICD-10-CM | POA: Diagnosis not present

## 2021-07-17 ENCOUNTER — Ambulatory Visit (INDEPENDENT_AMBULATORY_CARE_PROVIDER_SITE_OTHER): Payer: Medicare Other | Admitting: Cardiology

## 2021-07-17 DIAGNOSIS — I482 Chronic atrial fibrillation, unspecified: Secondary | ICD-10-CM

## 2021-07-17 DIAGNOSIS — N1831 Chronic kidney disease, stage 3a: Secondary | ICD-10-CM | POA: Diagnosis not present

## 2021-07-17 DIAGNOSIS — Z5181 Encounter for therapeutic drug level monitoring: Secondary | ICD-10-CM | POA: Diagnosis not present

## 2021-07-17 DIAGNOSIS — N189 Chronic kidney disease, unspecified: Secondary | ICD-10-CM | POA: Diagnosis not present

## 2021-07-17 DIAGNOSIS — Z7901 Long term (current) use of anticoagulants: Secondary | ICD-10-CM | POA: Diagnosis not present

## 2021-07-17 LAB — POCT INR: INR: 2.8 (ref 2.0–3.0)

## 2021-07-18 DIAGNOSIS — N390 Urinary tract infection, site not specified: Secondary | ICD-10-CM | POA: Diagnosis not present

## 2021-07-18 DIAGNOSIS — N3020 Other chronic cystitis without hematuria: Secondary | ICD-10-CM | POA: Diagnosis not present

## 2021-07-21 DIAGNOSIS — G4733 Obstructive sleep apnea (adult) (pediatric): Secondary | ICD-10-CM | POA: Diagnosis not present

## 2021-07-21 DIAGNOSIS — Z87440 Personal history of urinary (tract) infections: Secondary | ICD-10-CM | POA: Diagnosis not present

## 2021-07-21 DIAGNOSIS — I10 Essential (primary) hypertension: Secondary | ICD-10-CM | POA: Diagnosis not present

## 2021-07-21 DIAGNOSIS — Z7901 Long term (current) use of anticoagulants: Secondary | ICD-10-CM | POA: Diagnosis not present

## 2021-07-21 DIAGNOSIS — N183 Chronic kidney disease, stage 3 unspecified: Secondary | ICD-10-CM | POA: Diagnosis not present

## 2021-07-21 DIAGNOSIS — J449 Chronic obstructive pulmonary disease, unspecified: Secondary | ICD-10-CM | POA: Diagnosis not present

## 2021-07-21 DIAGNOSIS — Z780 Asymptomatic menopausal state: Secondary | ICD-10-CM | POA: Diagnosis not present

## 2021-07-21 DIAGNOSIS — I482 Chronic atrial fibrillation, unspecified: Secondary | ICD-10-CM | POA: Diagnosis not present

## 2021-07-21 DIAGNOSIS — I5030 Unspecified diastolic (congestive) heart failure: Secondary | ICD-10-CM | POA: Diagnosis not present

## 2021-07-21 DIAGNOSIS — E119 Type 2 diabetes mellitus without complications: Secondary | ICD-10-CM | POA: Diagnosis not present

## 2021-07-21 DIAGNOSIS — Z23 Encounter for immunization: Secondary | ICD-10-CM | POA: Diagnosis not present

## 2021-07-21 DIAGNOSIS — E1122 Type 2 diabetes mellitus with diabetic chronic kidney disease: Secondary | ICD-10-CM | POA: Diagnosis not present

## 2021-07-21 DIAGNOSIS — E1136 Type 2 diabetes mellitus with diabetic cataract: Secondary | ICD-10-CM | POA: Diagnosis not present

## 2021-07-21 DIAGNOSIS — E785 Hyperlipidemia, unspecified: Secondary | ICD-10-CM | POA: Diagnosis not present

## 2021-07-21 DIAGNOSIS — Z7984 Long term (current) use of oral hypoglycemic drugs: Secondary | ICD-10-CM | POA: Diagnosis not present

## 2021-07-21 DIAGNOSIS — Z13820 Encounter for screening for osteoporosis: Secondary | ICD-10-CM | POA: Diagnosis not present

## 2021-07-21 DIAGNOSIS — I130 Hypertensive heart and chronic kidney disease with heart failure and stage 1 through stage 4 chronic kidney disease, or unspecified chronic kidney disease: Secondary | ICD-10-CM | POA: Diagnosis not present

## 2021-07-25 ENCOUNTER — Telehealth: Payer: Self-pay

## 2021-07-25 ENCOUNTER — Ambulatory Visit (INDEPENDENT_AMBULATORY_CARE_PROVIDER_SITE_OTHER): Payer: Medicare Other | Admitting: Cardiovascular Disease

## 2021-07-25 DIAGNOSIS — N1832 Chronic kidney disease, stage 3b: Secondary | ICD-10-CM | POA: Diagnosis not present

## 2021-07-25 DIAGNOSIS — I482 Chronic atrial fibrillation, unspecified: Secondary | ICD-10-CM

## 2021-07-25 DIAGNOSIS — I4821 Permanent atrial fibrillation: Secondary | ICD-10-CM | POA: Diagnosis not present

## 2021-07-25 DIAGNOSIS — E1122 Type 2 diabetes mellitus with diabetic chronic kidney disease: Secondary | ICD-10-CM | POA: Diagnosis not present

## 2021-07-25 DIAGNOSIS — I130 Hypertensive heart and chronic kidney disease with heart failure and stage 1 through stage 4 chronic kidney disease, or unspecified chronic kidney disease: Secondary | ICD-10-CM | POA: Diagnosis not present

## 2021-07-25 DIAGNOSIS — Z7901 Long term (current) use of anticoagulants: Secondary | ICD-10-CM | POA: Diagnosis not present

## 2021-07-25 DIAGNOSIS — I5023 Acute on chronic systolic (congestive) heart failure: Secondary | ICD-10-CM | POA: Diagnosis not present

## 2021-07-25 DIAGNOSIS — Z5181 Encounter for therapeutic drug level monitoring: Secondary | ICD-10-CM

## 2021-07-25 DIAGNOSIS — Z87440 Personal history of urinary (tract) infections: Secondary | ICD-10-CM | POA: Diagnosis not present

## 2021-07-25 LAB — POCT INR: INR: 3 (ref 2.0–3.0)

## 2021-07-25 NOTE — Telephone Encounter (Signed)
Lmom for overdue inr self tester

## 2021-07-25 NOTE — Patient Instructions (Signed)
Description   Called and instructed pt to continue to take warfarin 1 tablet ( 68m) daily. Recheck INR Monday 07/31/21. [facility] [phone]

## 2021-07-28 ENCOUNTER — Ambulatory Visit: Payer: Medicare Other | Admitting: Sports Medicine

## 2021-08-01 ENCOUNTER — Telehealth: Payer: Self-pay

## 2021-08-01 DIAGNOSIS — M2042 Other hammer toe(s) (acquired), left foot: Secondary | ICD-10-CM | POA: Diagnosis not present

## 2021-08-01 DIAGNOSIS — M2041 Other hammer toe(s) (acquired), right foot: Secondary | ICD-10-CM | POA: Diagnosis not present

## 2021-08-01 DIAGNOSIS — L603 Nail dystrophy: Secondary | ICD-10-CM | POA: Diagnosis not present

## 2021-08-01 DIAGNOSIS — B351 Tinea unguium: Secondary | ICD-10-CM | POA: Diagnosis not present

## 2021-08-01 DIAGNOSIS — L84 Corns and callosities: Secondary | ICD-10-CM | POA: Diagnosis not present

## 2021-08-01 DIAGNOSIS — E119 Type 2 diabetes mellitus without complications: Secondary | ICD-10-CM | POA: Diagnosis not present

## 2021-08-01 DIAGNOSIS — E1142 Type 2 diabetes mellitus with diabetic polyneuropathy: Secondary | ICD-10-CM | POA: Diagnosis not present

## 2021-08-01 NOTE — Telephone Encounter (Signed)
Called and lmom pt's son Overdue inr Personal assistant

## 2021-08-02 ENCOUNTER — Ambulatory Visit (INDEPENDENT_AMBULATORY_CARE_PROVIDER_SITE_OTHER): Payer: Medicare Other | Admitting: Cardiology

## 2021-08-02 DIAGNOSIS — I482 Chronic atrial fibrillation, unspecified: Secondary | ICD-10-CM | POA: Diagnosis not present

## 2021-08-02 DIAGNOSIS — Z5181 Encounter for therapeutic drug level monitoring: Secondary | ICD-10-CM | POA: Diagnosis not present

## 2021-08-02 DIAGNOSIS — Z7901 Long term (current) use of anticoagulants: Secondary | ICD-10-CM

## 2021-08-02 LAB — POCT INR: INR: 2.3 (ref 2.0–3.0)

## 2021-08-08 ENCOUNTER — Ambulatory Visit: Payer: Medicare Other | Admitting: Sports Medicine

## 2021-08-09 ENCOUNTER — Ambulatory Visit (INDEPENDENT_AMBULATORY_CARE_PROVIDER_SITE_OTHER): Payer: Medicare Other | Admitting: Cardiology

## 2021-08-09 DIAGNOSIS — Z5181 Encounter for therapeutic drug level monitoring: Secondary | ICD-10-CM | POA: Diagnosis not present

## 2021-08-09 DIAGNOSIS — Z87440 Personal history of urinary (tract) infections: Secondary | ICD-10-CM | POA: Diagnosis not present

## 2021-08-09 DIAGNOSIS — I482 Chronic atrial fibrillation, unspecified: Secondary | ICD-10-CM

## 2021-08-09 DIAGNOSIS — I130 Hypertensive heart and chronic kidney disease with heart failure and stage 1 through stage 4 chronic kidney disease, or unspecified chronic kidney disease: Secondary | ICD-10-CM | POA: Diagnosis not present

## 2021-08-09 DIAGNOSIS — N1832 Chronic kidney disease, stage 3b: Secondary | ICD-10-CM | POA: Diagnosis not present

## 2021-08-09 DIAGNOSIS — I4821 Permanent atrial fibrillation: Secondary | ICD-10-CM | POA: Diagnosis not present

## 2021-08-09 DIAGNOSIS — E1122 Type 2 diabetes mellitus with diabetic chronic kidney disease: Secondary | ICD-10-CM | POA: Diagnosis not present

## 2021-08-09 DIAGNOSIS — I5023 Acute on chronic systolic (congestive) heart failure: Secondary | ICD-10-CM | POA: Diagnosis not present

## 2021-08-09 DIAGNOSIS — Z7901 Long term (current) use of anticoagulants: Secondary | ICD-10-CM

## 2021-08-09 LAB — POCT INR: INR: 2.6 (ref 2.0–3.0)

## 2021-08-17 ENCOUNTER — Ambulatory Visit (INDEPENDENT_AMBULATORY_CARE_PROVIDER_SITE_OTHER): Payer: Medicare Other | Admitting: Cardiology

## 2021-08-17 ENCOUNTER — Telehealth: Payer: Self-pay

## 2021-08-17 DIAGNOSIS — L821 Other seborrheic keratosis: Secondary | ICD-10-CM | POA: Diagnosis not present

## 2021-08-17 DIAGNOSIS — Z7901 Long term (current) use of anticoagulants: Secondary | ICD-10-CM | POA: Diagnosis not present

## 2021-08-17 DIAGNOSIS — I482 Chronic atrial fibrillation, unspecified: Secondary | ICD-10-CM | POA: Diagnosis not present

## 2021-08-17 DIAGNOSIS — Z5181 Encounter for therapeutic drug level monitoring: Secondary | ICD-10-CM | POA: Diagnosis not present

## 2021-08-17 DIAGNOSIS — Z1283 Encounter for screening for malignant neoplasm of skin: Secondary | ICD-10-CM | POA: Diagnosis not present

## 2021-08-17 DIAGNOSIS — L820 Inflamed seborrheic keratosis: Secondary | ICD-10-CM | POA: Diagnosis not present

## 2021-08-17 LAB — POCT INR: INR: 3.4 — AB (ref 2.0–3.0)

## 2021-08-17 NOTE — Telephone Encounter (Signed)
I tried reaching pt to check INR, but mailbox full.

## 2021-08-22 ENCOUNTER — Ambulatory Visit (INDEPENDENT_AMBULATORY_CARE_PROVIDER_SITE_OTHER): Payer: Medicare Other | Admitting: Internal Medicine

## 2021-08-22 DIAGNOSIS — I482 Chronic atrial fibrillation, unspecified: Secondary | ICD-10-CM

## 2021-08-22 DIAGNOSIS — Z5181 Encounter for therapeutic drug level monitoring: Secondary | ICD-10-CM

## 2021-08-22 LAB — POCT INR: INR: 2.4 (ref 2.0–3.0)

## 2021-08-22 NOTE — Patient Instructions (Signed)
Description   Called and spoke to pt and instructed her to continue to take warfarin 1 tablet (5mg) daily. Recheck INR 1 week. [facility] [phone]       

## 2021-08-30 ENCOUNTER — Ambulatory Visit (INDEPENDENT_AMBULATORY_CARE_PROVIDER_SITE_OTHER): Payer: Medicare Other

## 2021-08-30 DIAGNOSIS — I482 Chronic atrial fibrillation, unspecified: Secondary | ICD-10-CM | POA: Diagnosis not present

## 2021-08-30 DIAGNOSIS — Z5181 Encounter for therapeutic drug level monitoring: Secondary | ICD-10-CM | POA: Diagnosis not present

## 2021-08-30 DIAGNOSIS — Z7901 Long term (current) use of anticoagulants: Secondary | ICD-10-CM

## 2021-08-30 LAB — POCT INR: INR: 2.6 (ref 2.0–3.0)

## 2021-08-30 NOTE — Patient Instructions (Signed)
Description   Called and spoke to pt and instructed her to continue to take warfarin 1 tablet (31m) daily. Recheck INR 1 week. [facility] [phone]

## 2021-08-31 DIAGNOSIS — E1142 Type 2 diabetes mellitus with diabetic polyneuropathy: Secondary | ICD-10-CM | POA: Diagnosis not present

## 2021-08-31 DIAGNOSIS — E119 Type 2 diabetes mellitus without complications: Secondary | ICD-10-CM | POA: Diagnosis not present

## 2021-08-31 DIAGNOSIS — B351 Tinea unguium: Secondary | ICD-10-CM | POA: Diagnosis not present

## 2021-08-31 DIAGNOSIS — M2042 Other hammer toe(s) (acquired), left foot: Secondary | ICD-10-CM | POA: Diagnosis not present

## 2021-08-31 DIAGNOSIS — M2041 Other hammer toe(s) (acquired), right foot: Secondary | ICD-10-CM | POA: Diagnosis not present

## 2021-09-06 ENCOUNTER — Telehealth: Payer: Self-pay

## 2021-09-06 LAB — POCT INR: INR: 2.2 (ref 2.0–3.0)

## 2021-09-06 NOTE — Telephone Encounter (Signed)
Spoke to patient and reminded her to check INR

## 2021-09-07 ENCOUNTER — Ambulatory Visit (INDEPENDENT_AMBULATORY_CARE_PROVIDER_SITE_OTHER): Payer: Medicare Other | Admitting: Cardiology

## 2021-09-07 DIAGNOSIS — M5090 Cervical disc disorder, unspecified, unspecified cervical region: Secondary | ICD-10-CM | POA: Diagnosis not present

## 2021-09-07 DIAGNOSIS — Z5181 Encounter for therapeutic drug level monitoring: Secondary | ICD-10-CM | POA: Diagnosis not present

## 2021-09-07 DIAGNOSIS — Z91120 Patient's intentional underdosing of medication regimen due to financial hardship: Secondary | ICD-10-CM | POA: Diagnosis not present

## 2021-09-07 DIAGNOSIS — I482 Chronic atrial fibrillation, unspecified: Secondary | ICD-10-CM

## 2021-09-07 DIAGNOSIS — R32 Unspecified urinary incontinence: Secondary | ICD-10-CM | POA: Diagnosis not present

## 2021-09-07 DIAGNOSIS — H9201 Otalgia, right ear: Secondary | ICD-10-CM | POA: Diagnosis not present

## 2021-09-07 DIAGNOSIS — R030 Elevated blood-pressure reading, without diagnosis of hypertension: Secondary | ICD-10-CM | POA: Diagnosis not present

## 2021-09-07 DIAGNOSIS — H8111 Benign paroxysmal vertigo, right ear: Secondary | ICD-10-CM | POA: Diagnosis not present

## 2021-09-07 DIAGNOSIS — R42 Dizziness and giddiness: Secondary | ICD-10-CM | POA: Diagnosis not present

## 2021-09-07 DIAGNOSIS — R079 Chest pain, unspecified: Secondary | ICD-10-CM | POA: Diagnosis not present

## 2021-09-07 DIAGNOSIS — T443X6A Underdosing of other parasympatholytics [anticholinergics and antimuscarinics] and spasmolytics, initial encounter: Secondary | ICD-10-CM | POA: Diagnosis not present

## 2021-09-07 DIAGNOSIS — Z7901 Long term (current) use of anticoagulants: Secondary | ICD-10-CM

## 2021-09-14 ENCOUNTER — Ambulatory Visit (INDEPENDENT_AMBULATORY_CARE_PROVIDER_SITE_OTHER): Payer: Medicare Other | Admitting: Cardiology

## 2021-09-14 ENCOUNTER — Telehealth: Payer: Self-pay

## 2021-09-14 DIAGNOSIS — I482 Chronic atrial fibrillation, unspecified: Secondary | ICD-10-CM | POA: Diagnosis not present

## 2021-09-14 DIAGNOSIS — Z7901 Long term (current) use of anticoagulants: Secondary | ICD-10-CM | POA: Diagnosis not present

## 2021-09-14 DIAGNOSIS — Z5181 Encounter for therapeutic drug level monitoring: Secondary | ICD-10-CM

## 2021-09-14 LAB — POCT INR: INR: 2.6 (ref 2.0–3.0)

## 2021-09-14 NOTE — Telephone Encounter (Signed)
I spoke to the patient and reminded her to check INR.

## 2021-09-20 ENCOUNTER — Ambulatory Visit (INDEPENDENT_AMBULATORY_CARE_PROVIDER_SITE_OTHER): Payer: Medicare Other | Admitting: Cardiology

## 2021-09-20 DIAGNOSIS — Z5181 Encounter for therapeutic drug level monitoring: Secondary | ICD-10-CM

## 2021-09-20 DIAGNOSIS — Z7901 Long term (current) use of anticoagulants: Secondary | ICD-10-CM | POA: Diagnosis not present

## 2021-09-20 DIAGNOSIS — I482 Chronic atrial fibrillation, unspecified: Secondary | ICD-10-CM | POA: Diagnosis not present

## 2021-09-20 LAB — POCT INR: INR: 2.3 (ref 2.0–3.0)

## 2021-09-28 ENCOUNTER — Ambulatory Visit (INDEPENDENT_AMBULATORY_CARE_PROVIDER_SITE_OTHER): Payer: Medicare Other | Admitting: Cardiology

## 2021-09-28 DIAGNOSIS — Z5181 Encounter for therapeutic drug level monitoring: Secondary | ICD-10-CM | POA: Diagnosis not present

## 2021-09-28 LAB — POCT INR: INR: 2.5 (ref 2.0–3.0)

## 2021-09-28 NOTE — Patient Instructions (Signed)
Description   Called and spoke to pt and instructed her to continue to take warfarin 1 tablet (5mg) daily. Recheck INR 1 week. [facility] [phone]       

## 2021-10-04 ENCOUNTER — Ambulatory Visit (INDEPENDENT_AMBULATORY_CARE_PROVIDER_SITE_OTHER): Payer: Medicare Other | Admitting: Internal Medicine

## 2021-10-04 DIAGNOSIS — Z5181 Encounter for therapeutic drug level monitoring: Secondary | ICD-10-CM

## 2021-10-04 DIAGNOSIS — I482 Chronic atrial fibrillation, unspecified: Secondary | ICD-10-CM

## 2021-10-04 DIAGNOSIS — Z7901 Long term (current) use of anticoagulants: Secondary | ICD-10-CM

## 2021-10-04 LAB — POCT INR: INR: 1.9 — AB (ref 2.0–3.0)

## 2021-10-10 DIAGNOSIS — M542 Cervicalgia: Secondary | ICD-10-CM | POA: Diagnosis not present

## 2021-10-10 DIAGNOSIS — Z6832 Body mass index (BMI) 32.0-32.9, adult: Secondary | ICD-10-CM | POA: Diagnosis not present

## 2021-10-11 DIAGNOSIS — Z20822 Contact with and (suspected) exposure to covid-19: Secondary | ICD-10-CM | POA: Diagnosis not present

## 2021-10-12 ENCOUNTER — Ambulatory Visit (INDEPENDENT_AMBULATORY_CARE_PROVIDER_SITE_OTHER): Payer: Medicare Other | Admitting: Cardiology

## 2021-10-12 DIAGNOSIS — I482 Chronic atrial fibrillation, unspecified: Secondary | ICD-10-CM

## 2021-10-12 DIAGNOSIS — Z7901 Long term (current) use of anticoagulants: Secondary | ICD-10-CM | POA: Diagnosis not present

## 2021-10-12 DIAGNOSIS — Z5181 Encounter for therapeutic drug level monitoring: Secondary | ICD-10-CM

## 2021-10-12 LAB — POCT INR: INR: 2.7 (ref 2.0–3.0)

## 2021-10-13 DIAGNOSIS — M4802 Spinal stenosis, cervical region: Secondary | ICD-10-CM | POA: Diagnosis not present

## 2021-10-17 DIAGNOSIS — Z1231 Encounter for screening mammogram for malignant neoplasm of breast: Secondary | ICD-10-CM | POA: Diagnosis not present

## 2021-10-18 DIAGNOSIS — H25813 Combined forms of age-related cataract, bilateral: Secondary | ICD-10-CM | POA: Diagnosis not present

## 2021-10-19 ENCOUNTER — Ambulatory Visit (INDEPENDENT_AMBULATORY_CARE_PROVIDER_SITE_OTHER): Payer: Medicare Other | Admitting: Internal Medicine

## 2021-10-19 ENCOUNTER — Encounter: Payer: Self-pay | Admitting: Sports Medicine

## 2021-10-19 DIAGNOSIS — Z7901 Long term (current) use of anticoagulants: Secondary | ICD-10-CM

## 2021-10-19 DIAGNOSIS — I482 Chronic atrial fibrillation, unspecified: Secondary | ICD-10-CM | POA: Diagnosis not present

## 2021-10-19 DIAGNOSIS — Z5181 Encounter for therapeutic drug level monitoring: Secondary | ICD-10-CM

## 2021-10-19 LAB — POCT INR: INR: 2.5 (ref 2.0–3.0)

## 2021-10-24 DIAGNOSIS — N3946 Mixed incontinence: Secondary | ICD-10-CM | POA: Diagnosis not present

## 2021-10-24 DIAGNOSIS — N3020 Other chronic cystitis without hematuria: Secondary | ICD-10-CM | POA: Diagnosis not present

## 2021-10-24 DIAGNOSIS — E1165 Type 2 diabetes mellitus with hyperglycemia: Secondary | ICD-10-CM | POA: Diagnosis not present

## 2021-10-24 DIAGNOSIS — J449 Chronic obstructive pulmonary disease, unspecified: Secondary | ICD-10-CM | POA: Diagnosis not present

## 2021-10-24 DIAGNOSIS — E119 Type 2 diabetes mellitus without complications: Secondary | ICD-10-CM | POA: Diagnosis not present

## 2021-10-24 DIAGNOSIS — E1136 Type 2 diabetes mellitus with diabetic cataract: Secondary | ICD-10-CM | POA: Diagnosis not present

## 2021-10-24 DIAGNOSIS — E785 Hyperlipidemia, unspecified: Secondary | ICD-10-CM | POA: Diagnosis not present

## 2021-10-24 DIAGNOSIS — G4733 Obstructive sleep apnea (adult) (pediatric): Secondary | ICD-10-CM | POA: Diagnosis not present

## 2021-10-24 DIAGNOSIS — Z79899 Other long term (current) drug therapy: Secondary | ICD-10-CM | POA: Diagnosis not present

## 2021-10-24 DIAGNOSIS — I4811 Longstanding persistent atrial fibrillation: Secondary | ICD-10-CM | POA: Diagnosis not present

## 2021-10-24 DIAGNOSIS — I10 Essential (primary) hypertension: Secondary | ICD-10-CM | POA: Diagnosis not present

## 2021-10-26 ENCOUNTER — Ambulatory Visit (INDEPENDENT_AMBULATORY_CARE_PROVIDER_SITE_OTHER): Payer: Medicare Other | Admitting: Cardiology

## 2021-10-26 DIAGNOSIS — Z7901 Long term (current) use of anticoagulants: Secondary | ICD-10-CM

## 2021-10-26 DIAGNOSIS — I482 Chronic atrial fibrillation, unspecified: Secondary | ICD-10-CM

## 2021-10-26 DIAGNOSIS — Z5181 Encounter for therapeutic drug level monitoring: Secondary | ICD-10-CM

## 2021-10-26 LAB — POCT INR: INR: 2.8 (ref 2.0–3.0)

## 2021-10-30 DIAGNOSIS — M5090 Cervical disc disorder, unspecified, unspecified cervical region: Secondary | ICD-10-CM | POA: Diagnosis not present

## 2021-11-01 DIAGNOSIS — Z6832 Body mass index (BMI) 32.0-32.9, adult: Secondary | ICD-10-CM | POA: Diagnosis not present

## 2021-11-01 DIAGNOSIS — R0683 Snoring: Secondary | ICD-10-CM | POA: Diagnosis not present

## 2021-11-01 DIAGNOSIS — R400 Somnolence: Secondary | ICD-10-CM | POA: Diagnosis not present

## 2021-11-02 ENCOUNTER — Ambulatory Visit (INDEPENDENT_AMBULATORY_CARE_PROVIDER_SITE_OTHER): Payer: Medicare Other | Admitting: Cardiovascular Disease

## 2021-11-02 DIAGNOSIS — M2041 Other hammer toe(s) (acquired), right foot: Secondary | ICD-10-CM | POA: Diagnosis not present

## 2021-11-02 DIAGNOSIS — E1142 Type 2 diabetes mellitus with diabetic polyneuropathy: Secondary | ICD-10-CM | POA: Diagnosis not present

## 2021-11-02 DIAGNOSIS — Z5181 Encounter for therapeutic drug level monitoring: Secondary | ICD-10-CM

## 2021-11-02 DIAGNOSIS — L603 Nail dystrophy: Secondary | ICD-10-CM | POA: Diagnosis not present

## 2021-11-02 DIAGNOSIS — Z7901 Long term (current) use of anticoagulants: Secondary | ICD-10-CM

## 2021-11-02 DIAGNOSIS — M2042 Other hammer toe(s) (acquired), left foot: Secondary | ICD-10-CM | POA: Diagnosis not present

## 2021-11-02 DIAGNOSIS — M5090 Cervical disc disorder, unspecified, unspecified cervical region: Secondary | ICD-10-CM | POA: Diagnosis not present

## 2021-11-02 DIAGNOSIS — I482 Chronic atrial fibrillation, unspecified: Secondary | ICD-10-CM | POA: Diagnosis not present

## 2021-11-02 DIAGNOSIS — B351 Tinea unguium: Secondary | ICD-10-CM | POA: Diagnosis not present

## 2021-11-02 DIAGNOSIS — E119 Type 2 diabetes mellitus without complications: Secondary | ICD-10-CM | POA: Diagnosis not present

## 2021-11-02 LAB — POCT INR: INR: 2.1 (ref 2.0–3.0)

## 2021-11-03 DIAGNOSIS — H527 Unspecified disorder of refraction: Secondary | ICD-10-CM | POA: Diagnosis not present

## 2021-11-03 DIAGNOSIS — H25811 Combined forms of age-related cataract, right eye: Secondary | ICD-10-CM | POA: Diagnosis not present

## 2021-11-09 ENCOUNTER — Ambulatory Visit (INDEPENDENT_AMBULATORY_CARE_PROVIDER_SITE_OTHER): Payer: Medicare Other | Admitting: Cardiovascular Disease

## 2021-11-09 DIAGNOSIS — Z7901 Long term (current) use of anticoagulants: Secondary | ICD-10-CM | POA: Diagnosis not present

## 2021-11-09 DIAGNOSIS — I482 Chronic atrial fibrillation, unspecified: Secondary | ICD-10-CM

## 2021-11-09 DIAGNOSIS — Z5181 Encounter for therapeutic drug level monitoring: Secondary | ICD-10-CM

## 2021-11-09 LAB — POCT INR: INR: 3.3 — AB (ref 2.0–3.0)

## 2021-11-14 DIAGNOSIS — I110 Hypertensive heart disease with heart failure: Secondary | ICD-10-CM | POA: Diagnosis not present

## 2021-11-14 DIAGNOSIS — M5090 Cervical disc disorder, unspecified, unspecified cervical region: Secondary | ICD-10-CM | POA: Diagnosis not present

## 2021-11-14 DIAGNOSIS — J449 Chronic obstructive pulmonary disease, unspecified: Secondary | ICD-10-CM | POA: Diagnosis not present

## 2021-11-14 DIAGNOSIS — Z7901 Long term (current) use of anticoagulants: Secondary | ICD-10-CM | POA: Diagnosis not present

## 2021-11-14 DIAGNOSIS — I428 Other cardiomyopathies: Secondary | ICD-10-CM | POA: Diagnosis not present

## 2021-11-14 DIAGNOSIS — Z79899 Other long term (current) drug therapy: Secondary | ICD-10-CM | POA: Diagnosis not present

## 2021-11-14 DIAGNOSIS — I482 Chronic atrial fibrillation, unspecified: Secondary | ICD-10-CM | POA: Diagnosis not present

## 2021-11-14 DIAGNOSIS — E119 Type 2 diabetes mellitus without complications: Secondary | ICD-10-CM | POA: Diagnosis not present

## 2021-11-14 DIAGNOSIS — I5022 Chronic systolic (congestive) heart failure: Secondary | ICD-10-CM | POA: Diagnosis not present

## 2021-11-14 DIAGNOSIS — Z794 Long term (current) use of insulin: Secondary | ICD-10-CM | POA: Diagnosis not present

## 2021-11-14 DIAGNOSIS — N184 Chronic kidney disease, stage 4 (severe): Secondary | ICD-10-CM | POA: Insufficient documentation

## 2021-11-16 ENCOUNTER — Telehealth: Payer: Self-pay

## 2021-11-16 ENCOUNTER — Ambulatory Visit (INDEPENDENT_AMBULATORY_CARE_PROVIDER_SITE_OTHER): Payer: Medicare Other | Admitting: Cardiology

## 2021-11-16 DIAGNOSIS — Z5181 Encounter for therapeutic drug level monitoring: Secondary | ICD-10-CM | POA: Diagnosis not present

## 2021-11-16 DIAGNOSIS — I482 Chronic atrial fibrillation, unspecified: Secondary | ICD-10-CM

## 2021-11-16 DIAGNOSIS — Z7901 Long term (current) use of anticoagulants: Secondary | ICD-10-CM

## 2021-11-16 LAB — POCT INR: INR: 3.3 — AB (ref 2.0–3.0)

## 2021-11-16 NOTE — Telephone Encounter (Signed)
Reminded pt to check INR. Verbalized understanding

## 2021-11-17 DIAGNOSIS — M5090 Cervical disc disorder, unspecified, unspecified cervical region: Secondary | ICD-10-CM | POA: Diagnosis not present

## 2021-11-20 DIAGNOSIS — M5090 Cervical disc disorder, unspecified, unspecified cervical region: Secondary | ICD-10-CM | POA: Diagnosis not present

## 2021-11-23 ENCOUNTER — Telehealth: Payer: Self-pay

## 2021-11-23 ENCOUNTER — Ambulatory Visit (INDEPENDENT_AMBULATORY_CARE_PROVIDER_SITE_OTHER): Payer: Medicare Other | Admitting: Internal Medicine

## 2021-11-23 DIAGNOSIS — Z7901 Long term (current) use of anticoagulants: Secondary | ICD-10-CM

## 2021-11-23 DIAGNOSIS — Z5181 Encounter for therapeutic drug level monitoring: Secondary | ICD-10-CM

## 2021-11-23 DIAGNOSIS — I482 Chronic atrial fibrillation, unspecified: Secondary | ICD-10-CM

## 2021-11-23 LAB — POCT INR: INR: 3.6 — AB (ref 2.0–3.0)

## 2021-11-23 NOTE — Telephone Encounter (Signed)
Reminded patient to check INR.  Verbalized understanding.

## 2021-11-28 DIAGNOSIS — E119 Type 2 diabetes mellitus without complications: Secondary | ICD-10-CM | POA: Diagnosis not present

## 2021-11-28 DIAGNOSIS — I482 Chronic atrial fibrillation, unspecified: Secondary | ICD-10-CM | POA: Diagnosis not present

## 2021-11-28 DIAGNOSIS — I428 Other cardiomyopathies: Secondary | ICD-10-CM | POA: Diagnosis not present

## 2021-11-28 DIAGNOSIS — I10 Essential (primary) hypertension: Secondary | ICD-10-CM | POA: Diagnosis not present

## 2021-11-28 DIAGNOSIS — I5022 Chronic systolic (congestive) heart failure: Secondary | ICD-10-CM | POA: Diagnosis not present

## 2021-11-28 DIAGNOSIS — I4819 Other persistent atrial fibrillation: Secondary | ICD-10-CM | POA: Diagnosis not present

## 2021-11-28 DIAGNOSIS — I4811 Longstanding persistent atrial fibrillation: Secondary | ICD-10-CM | POA: Diagnosis not present

## 2021-11-28 DIAGNOSIS — N1831 Chronic kidney disease, stage 3a: Secondary | ICD-10-CM | POA: Diagnosis not present

## 2021-11-29 ENCOUNTER — Ambulatory Visit (INDEPENDENT_AMBULATORY_CARE_PROVIDER_SITE_OTHER): Payer: Medicare Other | Admitting: Cardiovascular Disease

## 2021-11-29 DIAGNOSIS — I482 Chronic atrial fibrillation, unspecified: Secondary | ICD-10-CM | POA: Diagnosis not present

## 2021-11-29 DIAGNOSIS — Z5181 Encounter for therapeutic drug level monitoring: Secondary | ICD-10-CM

## 2021-11-29 DIAGNOSIS — Z7901 Long term (current) use of anticoagulants: Secondary | ICD-10-CM

## 2021-11-29 LAB — POCT INR: INR: 2.9 (ref 2.0–3.0)

## 2021-12-06 ENCOUNTER — Telehealth: Payer: Self-pay

## 2021-12-06 NOTE — Telephone Encounter (Signed)
Reminded pt to check INR. Verbalized understanding °

## 2021-12-07 ENCOUNTER — Ambulatory Visit (INDEPENDENT_AMBULATORY_CARE_PROVIDER_SITE_OTHER): Payer: Medicare Other | Admitting: Internal Medicine

## 2021-12-07 DIAGNOSIS — Z7901 Long term (current) use of anticoagulants: Secondary | ICD-10-CM

## 2021-12-07 DIAGNOSIS — I482 Chronic atrial fibrillation, unspecified: Secondary | ICD-10-CM

## 2021-12-07 DIAGNOSIS — Z5181 Encounter for therapeutic drug level monitoring: Secondary | ICD-10-CM

## 2021-12-07 LAB — POCT INR: INR: 2.4 (ref 2.0–3.0)

## 2021-12-13 ENCOUNTER — Ambulatory Visit (INDEPENDENT_AMBULATORY_CARE_PROVIDER_SITE_OTHER): Payer: Medicare Other | Admitting: Cardiology

## 2021-12-13 DIAGNOSIS — Z5181 Encounter for therapeutic drug level monitoring: Secondary | ICD-10-CM

## 2021-12-13 DIAGNOSIS — I482 Chronic atrial fibrillation, unspecified: Secondary | ICD-10-CM | POA: Diagnosis not present

## 2021-12-13 DIAGNOSIS — Z7901 Long term (current) use of anticoagulants: Secondary | ICD-10-CM

## 2021-12-13 LAB — POCT INR: INR: 2.5 (ref 2.0–3.0)

## 2021-12-20 ENCOUNTER — Ambulatory Visit (INDEPENDENT_AMBULATORY_CARE_PROVIDER_SITE_OTHER): Payer: Medicare Other | Admitting: Cardiovascular Disease

## 2021-12-20 DIAGNOSIS — Z5181 Encounter for therapeutic drug level monitoring: Secondary | ICD-10-CM

## 2021-12-20 DIAGNOSIS — Z7901 Long term (current) use of anticoagulants: Secondary | ICD-10-CM

## 2021-12-20 DIAGNOSIS — I482 Chronic atrial fibrillation, unspecified: Secondary | ICD-10-CM | POA: Diagnosis not present

## 2021-12-20 LAB — POCT INR: INR: 3.1 — AB (ref 2.0–3.0)

## 2021-12-27 ENCOUNTER — Ambulatory Visit (INDEPENDENT_AMBULATORY_CARE_PROVIDER_SITE_OTHER): Payer: Medicare Other | Admitting: Cardiology

## 2021-12-27 DIAGNOSIS — I482 Chronic atrial fibrillation, unspecified: Secondary | ICD-10-CM | POA: Diagnosis not present

## 2021-12-27 DIAGNOSIS — Z5181 Encounter for therapeutic drug level monitoring: Secondary | ICD-10-CM

## 2021-12-27 DIAGNOSIS — Z7901 Long term (current) use of anticoagulants: Secondary | ICD-10-CM

## 2021-12-27 LAB — POCT INR: INR: 2.4 (ref 2.0–3.0)

## 2022-01-03 ENCOUNTER — Ambulatory Visit (INDEPENDENT_AMBULATORY_CARE_PROVIDER_SITE_OTHER): Payer: Medicare Other | Admitting: Cardiovascular Disease

## 2022-01-03 DIAGNOSIS — Z5181 Encounter for therapeutic drug level monitoring: Secondary | ICD-10-CM | POA: Diagnosis not present

## 2022-01-03 DIAGNOSIS — Z7901 Long term (current) use of anticoagulants: Secondary | ICD-10-CM

## 2022-01-03 DIAGNOSIS — I482 Chronic atrial fibrillation, unspecified: Secondary | ICD-10-CM | POA: Diagnosis not present

## 2022-01-03 LAB — POCT INR: INR: 3.4 — AB (ref 2.0–3.0)

## 2022-01-10 ENCOUNTER — Ambulatory Visit (INDEPENDENT_AMBULATORY_CARE_PROVIDER_SITE_OTHER): Payer: Medicare Other | Admitting: Cardiology

## 2022-01-10 DIAGNOSIS — Z5181 Encounter for therapeutic drug level monitoring: Secondary | ICD-10-CM

## 2022-01-10 DIAGNOSIS — I482 Chronic atrial fibrillation, unspecified: Secondary | ICD-10-CM

## 2022-01-10 DIAGNOSIS — Z7901 Long term (current) use of anticoagulants: Secondary | ICD-10-CM

## 2022-01-10 LAB — POCT INR: INR: 2.2 (ref 2.0–3.0)

## 2022-01-17 ENCOUNTER — Ambulatory Visit (INDEPENDENT_AMBULATORY_CARE_PROVIDER_SITE_OTHER): Payer: Medicare Other | Admitting: Pharmacist Clinician (PhC)/ Clinical Pharmacy Specialist

## 2022-01-17 DIAGNOSIS — Z7901 Long term (current) use of anticoagulants: Secondary | ICD-10-CM

## 2022-01-17 DIAGNOSIS — I482 Chronic atrial fibrillation, unspecified: Secondary | ICD-10-CM | POA: Diagnosis not present

## 2022-01-17 LAB — POCT INR: INR: 3.4 — AB (ref 2.0–3.0)

## 2022-01-23 DIAGNOSIS — I10 Essential (primary) hypertension: Secondary | ICD-10-CM | POA: Diagnosis not present

## 2022-01-23 DIAGNOSIS — Z7901 Long term (current) use of anticoagulants: Secondary | ICD-10-CM | POA: Diagnosis not present

## 2022-01-23 DIAGNOSIS — N39490 Overflow incontinence: Secondary | ICD-10-CM | POA: Diagnosis not present

## 2022-01-23 DIAGNOSIS — N184 Chronic kidney disease, stage 4 (severe): Secondary | ICD-10-CM | POA: Diagnosis not present

## 2022-01-23 DIAGNOSIS — E119 Type 2 diabetes mellitus without complications: Secondary | ICD-10-CM | POA: Diagnosis not present

## 2022-01-23 DIAGNOSIS — I482 Chronic atrial fibrillation, unspecified: Secondary | ICD-10-CM | POA: Diagnosis not present

## 2022-01-23 DIAGNOSIS — E785 Hyperlipidemia, unspecified: Secondary | ICD-10-CM | POA: Diagnosis not present

## 2022-01-23 DIAGNOSIS — I5033 Acute on chronic diastolic (congestive) heart failure: Secondary | ICD-10-CM | POA: Diagnosis not present

## 2022-01-24 LAB — POCT INR: INR: 2.5 (ref 2.0–3.0)

## 2022-01-25 ENCOUNTER — Ambulatory Visit (INDEPENDENT_AMBULATORY_CARE_PROVIDER_SITE_OTHER): Payer: Medicare Other | Admitting: Cardiology

## 2022-01-25 DIAGNOSIS — I482 Chronic atrial fibrillation, unspecified: Secondary | ICD-10-CM

## 2022-01-25 DIAGNOSIS — Z5181 Encounter for therapeutic drug level monitoring: Secondary | ICD-10-CM | POA: Diagnosis not present

## 2022-01-25 DIAGNOSIS — Z7901 Long term (current) use of anticoagulants: Secondary | ICD-10-CM

## 2022-01-31 DIAGNOSIS — Z7901 Long term (current) use of anticoagulants: Secondary | ICD-10-CM | POA: Diagnosis not present

## 2022-01-31 LAB — POCT INR: INR: 2.2 (ref 2.0–3.0)

## 2022-02-01 ENCOUNTER — Ambulatory Visit (INDEPENDENT_AMBULATORY_CARE_PROVIDER_SITE_OTHER): Payer: Medicare Other | Admitting: Cardiovascular Disease

## 2022-02-01 DIAGNOSIS — I482 Chronic atrial fibrillation, unspecified: Secondary | ICD-10-CM

## 2022-02-01 DIAGNOSIS — Z7901 Long term (current) use of anticoagulants: Secondary | ICD-10-CM

## 2022-02-01 DIAGNOSIS — Z5181 Encounter for therapeutic drug level monitoring: Secondary | ICD-10-CM

## 2022-02-02 DIAGNOSIS — I5022 Chronic systolic (congestive) heart failure: Secondary | ICD-10-CM | POA: Diagnosis not present

## 2022-02-02 DIAGNOSIS — N1832 Chronic kidney disease, stage 3b: Secondary | ICD-10-CM | POA: Diagnosis not present

## 2022-02-02 DIAGNOSIS — E119 Type 2 diabetes mellitus without complications: Secondary | ICD-10-CM | POA: Diagnosis not present

## 2022-02-02 DIAGNOSIS — I428 Other cardiomyopathies: Secondary | ICD-10-CM | POA: Diagnosis not present

## 2022-02-02 DIAGNOSIS — E661 Drug-induced obesity: Secondary | ICD-10-CM | POA: Diagnosis not present

## 2022-02-06 ENCOUNTER — Ambulatory Visit (INDEPENDENT_AMBULATORY_CARE_PROVIDER_SITE_OTHER): Payer: Medicare Other | Admitting: Sports Medicine

## 2022-02-06 ENCOUNTER — Encounter: Payer: Self-pay | Admitting: Sports Medicine

## 2022-02-06 DIAGNOSIS — M2010 Hallux valgus (acquired), unspecified foot: Secondary | ICD-10-CM

## 2022-02-06 DIAGNOSIS — I739 Peripheral vascular disease, unspecified: Secondary | ICD-10-CM

## 2022-02-06 DIAGNOSIS — M2141 Flat foot [pes planus] (acquired), right foot: Secondary | ICD-10-CM

## 2022-02-06 DIAGNOSIS — M79675 Pain in left toe(s): Secondary | ICD-10-CM | POA: Diagnosis not present

## 2022-02-06 DIAGNOSIS — M2142 Flat foot [pes planus] (acquired), left foot: Secondary | ICD-10-CM

## 2022-02-06 DIAGNOSIS — B351 Tinea unguium: Secondary | ICD-10-CM | POA: Diagnosis not present

## 2022-02-06 DIAGNOSIS — M2042 Other hammer toe(s) (acquired), left foot: Secondary | ICD-10-CM

## 2022-02-06 DIAGNOSIS — E1140 Type 2 diabetes mellitus with diabetic neuropathy, unspecified: Secondary | ICD-10-CM

## 2022-02-06 DIAGNOSIS — M79674 Pain in right toe(s): Secondary | ICD-10-CM | POA: Diagnosis not present

## 2022-02-06 DIAGNOSIS — M2041 Other hammer toe(s) (acquired), right foot: Secondary | ICD-10-CM

## 2022-02-06 NOTE — Progress Notes (Signed)
Subjective: ?Rebekah Stewart is a 87 y.o. female patient with history of diabetes who returns to office today complaining of long, painful nails while ambulating in shoes; unable to trim.  A1c not recorded.  ?Threasa Heads, DO last visit January 23, 2022  ? ? ?Patient Active Problem List  ? Diagnosis Date Noted  ? OSA (obstructive sleep apnea) 07/07/2019  ? Respiratory failure with hypoxia ([place]) 10/10/2018  ? Chronic anticoagulation 08/19/2018  ? Non-insulin dependent type 2 diabetes mellitus ([place]) 08/13/2018  ? Intertrigo 08/10/2018  ? Pulmonary hypertension assoc with unclear multi-factorial mechanisms ([place])   ? Acute on chronic diastolic heart failure ([place])   ? Flash pulmonary edema ([place]) 08/09/2018  ? Varicose veins of lower extremities with complications, bilateral 10/30/2016  ? Dyslipidemia 05/21/2014  ? Bilateral leg pain 11/26/2012  ? Chronic diastolic heart failure ([place])   ? Chronic atrial fibrillation (HCC)   ? Essential hypertension   ? Coronary artery disease   ? ?Current Outpatient Medications on File Prior to Visit  ?Medication Sig Dispense Refill  ? allopurinol (ZYLOPRIM) 300 MG tablet Take 1 tablet (300 mg total) by mouth daily as needed (for gout). 30 tablet 1  ? atorvastatin (LIPITOR) 40 MG tablet TAKE 1 TABLET BY MOUTH EVERY DAY 30 tablet 1  ? carvedilol (COREG) 25 MG tablet Take 1 tablet (25 mg total) by mouth 2 (two) times daily with a meal. 180 tablet 2  ? Cholecalciferol (VITAMIN D) 2000 UNITS CAPS Take 1 capsule by mouth daily.    ? fish oil-omega-3 fatty acids 1000 MG capsule Take 1 g by mouth 2 (two) times daily.    ? glucose blood (ACCU-CHEK AVIVA PLUS) test strip Check two times daily. Dx code E11.65 100 each 0  ? isosorbide mononitrate (IMDUR) 30 MG 24 hr tablet Take 1 tablet (30 mg total) by mouth daily. Take 1/2 tablet 15 mg daily 90 tablet 3  ? Lancets (ONETOUCH DELICA PLUS YQMGNO03B) MISC 1 each by Does not apply route 2 (two) times daily before a meal. Use to check blood  sugars twilce daily before meal 100 each 0  ? metFORMIN (GLUCOPHAGE) 500 MG tablet Take 1 tablet (500 mg total) by mouth 3 (three) times daily with meals. 90 tablet 1  ? sacubitril-valsartan (ENTRESTO) 24-26 MG Take 1 tablet by mouth 2 (two) times daily.    ? spironolactone (ALDACTONE) 25 MG tablet Take 12.5 mg by mouth daily.    ? warfarin (COUMADIN) 5 MG tablet Take 5 mg by mouth daily.    ? ?No current facility-administered medications on file prior to visit.  ? ?Allergies  ?Allergen Reactions  ? Bee Venom Shortness Of Breath and Swelling  ? Iodinated Contrast Media Itching  ? Penicillins Swelling  ? Tape   ?  Pulls the skin  ? ? ? ? ?Objective: ?General: Patient is awake, alert, and oriented x 3 and in no acute distress. ? ?Integument: Skin is warm, dry and supple bilateral. Nails are tender, long, thickened and dystrophic with subungual debris, consistent with onychomycosis, 1-5 bilateral.  No acute signs of infection. No open lesions or preulcerative lesions present bilateral. Remaining integument unremarkable. ? ?Vasculature:  Dorsalis Pedis pulse 1/4 bilateral. Posterior Tibial pulse  0/4 bilateral. Capillary fill time <5 sec 1-5 bilateral. Diminished hair growth to the level of the digits.Temperature gradient within normal limits.  Moderate varicosities and venous stasis hyperpigmentation with trace edema bilateral at ankles like previous.  ? ?Neurology: The patient has diminished sensation measured  with a 5.07/10g Semmes Weinstein Monofilament at all pedal sites bilateral. Vibratory sensation diminished bilateral with tuning fork. No Babinski sign present bilateral.  ? ?Musculoskeletal: Asymptomatic pes planus, bunion and hammertoe. Muscular strength 5/5 in all lower extremity muscular groups bilateral without pain on range of motion. No tenderness with calf compression bilateral. ? ?Assessment and Plan: ?Problem List Items Addressed This Visit   ?None ?Visit Diagnoses   ? ? Pain due to onychomycosis of  toenails of both feet    -  Primary  ? Type 2 diabetes, controlled, with neuropathy ([place])      ? PVD (peripheral vascular disease) ([place])      ? Acquired hallux valgus, unspecified laterality      ? Hammer toes of both feet      ? Pes planus of both feet      ? ?  ? ?-Examined patient. ?-Re-discussed and educated patient on diabetic foot care and importance of daily foot inspection in setting of diabetes ?-Mechanically debrided all painful nails 1-5 bilateral using sterile nail nipper and filed with dremel without incident  ?-Patient to return  in 3 months for at risk nail care ?-Patient advised to call the office if any problems or questions arise in the meantime. ? ?Landis Martins, DPM ? ?  ?

## 2022-02-07 ENCOUNTER — Ambulatory Visit (INDEPENDENT_AMBULATORY_CARE_PROVIDER_SITE_OTHER): Payer: Medicare Other | Admitting: Cardiovascular Disease

## 2022-02-07 ENCOUNTER — Telehealth: Payer: Self-pay

## 2022-02-07 DIAGNOSIS — M1A9XX0 Chronic gout, unspecified, without tophus (tophi): Secondary | ICD-10-CM | POA: Diagnosis not present

## 2022-02-07 DIAGNOSIS — I509 Heart failure, unspecified: Secondary | ICD-10-CM | POA: Diagnosis not present

## 2022-02-07 DIAGNOSIS — I10 Essential (primary) hypertension: Secondary | ICD-10-CM | POA: Diagnosis not present

## 2022-02-07 DIAGNOSIS — Z5181 Encounter for therapeutic drug level monitoring: Secondary | ICD-10-CM | POA: Diagnosis not present

## 2022-02-07 DIAGNOSIS — N1831 Chronic kidney disease, stage 3a: Secondary | ICD-10-CM | POA: Diagnosis not present

## 2022-02-07 DIAGNOSIS — I4891 Unspecified atrial fibrillation: Secondary | ICD-10-CM | POA: Diagnosis not present

## 2022-02-07 DIAGNOSIS — Z7901 Long term (current) use of anticoagulants: Secondary | ICD-10-CM

## 2022-02-07 DIAGNOSIS — E785 Hyperlipidemia, unspecified: Secondary | ICD-10-CM | POA: Diagnosis not present

## 2022-02-07 DIAGNOSIS — I482 Chronic atrial fibrillation, unspecified: Secondary | ICD-10-CM | POA: Diagnosis not present

## 2022-02-07 LAB — POCT INR: INR: 2.4 (ref 2.0–3.0)

## 2022-02-07 NOTE — Telephone Encounter (Signed)
done ?

## 2022-02-14 ENCOUNTER — Ambulatory Visit (INDEPENDENT_AMBULATORY_CARE_PROVIDER_SITE_OTHER): Payer: Medicare Other | Admitting: Cardiology

## 2022-02-14 DIAGNOSIS — Z5181 Encounter for therapeutic drug level monitoring: Secondary | ICD-10-CM | POA: Diagnosis not present

## 2022-02-14 DIAGNOSIS — I482 Chronic atrial fibrillation, unspecified: Secondary | ICD-10-CM | POA: Diagnosis not present

## 2022-02-14 DIAGNOSIS — Z7901 Long term (current) use of anticoagulants: Secondary | ICD-10-CM

## 2022-02-14 LAB — POCT INR: INR: 2.8 (ref 2.0–3.0)

## 2022-02-21 ENCOUNTER — Ambulatory Visit (INDEPENDENT_AMBULATORY_CARE_PROVIDER_SITE_OTHER): Payer: Medicare Other | Admitting: Cardiovascular Disease

## 2022-02-21 DIAGNOSIS — Z5181 Encounter for therapeutic drug level monitoring: Secondary | ICD-10-CM

## 2022-02-21 DIAGNOSIS — I482 Chronic atrial fibrillation, unspecified: Secondary | ICD-10-CM | POA: Diagnosis not present

## 2022-02-21 DIAGNOSIS — Z7901 Long term (current) use of anticoagulants: Secondary | ICD-10-CM

## 2022-02-21 LAB — POCT INR: INR: 2.3 (ref 2.0–3.0)

## 2022-02-28 ENCOUNTER — Ambulatory Visit (INDEPENDENT_AMBULATORY_CARE_PROVIDER_SITE_OTHER): Payer: Medicare Other | Admitting: Cardiology

## 2022-02-28 DIAGNOSIS — Z5181 Encounter for therapeutic drug level monitoring: Secondary | ICD-10-CM | POA: Diagnosis not present

## 2022-02-28 DIAGNOSIS — Z7901 Long term (current) use of anticoagulants: Secondary | ICD-10-CM | POA: Diagnosis not present

## 2022-02-28 DIAGNOSIS — I482 Chronic atrial fibrillation, unspecified: Secondary | ICD-10-CM | POA: Diagnosis not present

## 2022-02-28 LAB — POCT INR: INR: 2.8 (ref 2.0–3.0)

## 2022-03-07 LAB — POCT INR: INR: 2.6 (ref 2.0–3.0)

## 2022-03-08 ENCOUNTER — Ambulatory Visit (INDEPENDENT_AMBULATORY_CARE_PROVIDER_SITE_OTHER): Payer: Medicare Other | Admitting: Cardiovascular Disease

## 2022-03-08 DIAGNOSIS — I482 Chronic atrial fibrillation, unspecified: Secondary | ICD-10-CM

## 2022-03-08 DIAGNOSIS — Z5181 Encounter for therapeutic drug level monitoring: Secondary | ICD-10-CM

## 2022-03-08 DIAGNOSIS — Z7901 Long term (current) use of anticoagulants: Secondary | ICD-10-CM

## 2022-03-14 ENCOUNTER — Ambulatory Visit (INDEPENDENT_AMBULATORY_CARE_PROVIDER_SITE_OTHER): Payer: Medicare Other | Admitting: *Deleted

## 2022-03-14 DIAGNOSIS — I482 Chronic atrial fibrillation, unspecified: Secondary | ICD-10-CM

## 2022-03-14 DIAGNOSIS — Z7901 Long term (current) use of anticoagulants: Secondary | ICD-10-CM | POA: Diagnosis not present

## 2022-03-14 LAB — POCT INR: INR: 2.7 (ref 2.0–3.0)

## 2022-03-14 NOTE — Patient Instructions (Addendum)
Description   Spoke with pt and instructed pt to continue 1 tablet (51m) daily, except 1/2 tablet (2.581m Wednesdays. Recheck INR 1 week. [facility] [phone]

## 2022-03-21 LAB — POCT INR: INR: 2.7 (ref 2.0–3.0)

## 2022-03-22 ENCOUNTER — Ambulatory Visit (INDEPENDENT_AMBULATORY_CARE_PROVIDER_SITE_OTHER): Payer: Medicare Other | Admitting: *Deleted

## 2022-03-22 DIAGNOSIS — Z7901 Long term (current) use of anticoagulants: Secondary | ICD-10-CM

## 2022-03-22 DIAGNOSIS — I482 Chronic atrial fibrillation, unspecified: Secondary | ICD-10-CM | POA: Diagnosis not present

## 2022-03-28 ENCOUNTER — Ambulatory Visit (INDEPENDENT_AMBULATORY_CARE_PROVIDER_SITE_OTHER): Payer: Medicare Other

## 2022-03-28 DIAGNOSIS — Z7901 Long term (current) use of anticoagulants: Secondary | ICD-10-CM

## 2022-03-28 DIAGNOSIS — I482 Chronic atrial fibrillation, unspecified: Secondary | ICD-10-CM | POA: Diagnosis not present

## 2022-03-28 LAB — POCT INR: INR: 3.1 — AB (ref 2.0–3.0)

## 2022-03-28 NOTE — Patient Instructions (Signed)
Description   Spoke with pt and instructed pt to eat a serving of greens today and continue 1 tablet (83m) daily, except 1/2 tablet (2.573m Wednesdays. Recheck INR 1 week. [facility] [phone]

## 2022-04-04 ENCOUNTER — Ambulatory Visit (INDEPENDENT_AMBULATORY_CARE_PROVIDER_SITE_OTHER): Payer: Medicare Other | Admitting: *Deleted

## 2022-04-04 DIAGNOSIS — Z7901 Long term (current) use of anticoagulants: Secondary | ICD-10-CM

## 2022-04-04 DIAGNOSIS — I482 Chronic atrial fibrillation, unspecified: Secondary | ICD-10-CM | POA: Diagnosis not present

## 2022-04-04 LAB — POCT INR: INR: 3.8 — AB (ref 2.0–3.0)

## 2022-04-04 NOTE — Patient Instructions (Signed)
Description   Spoke with pt and instructed pt to hold today's dose then start taking 1 tablet (38m) daily, except 1/2 tablet (2.577m on Sundays and Wednesdays. Recheck INR 1 week. [facility] [phone]

## 2022-04-11 ENCOUNTER — Ambulatory Visit (INDEPENDENT_AMBULATORY_CARE_PROVIDER_SITE_OTHER): Payer: Medicare Other | Admitting: *Deleted

## 2022-04-11 DIAGNOSIS — Z7901 Long term (current) use of anticoagulants: Secondary | ICD-10-CM

## 2022-04-11 DIAGNOSIS — I482 Chronic atrial fibrillation, unspecified: Secondary | ICD-10-CM

## 2022-04-11 LAB — POCT INR: INR: 2.5 (ref 2.0–3.0)

## 2022-04-11 NOTE — Patient Instructions (Addendum)
Description   Spoke with pt and instructed pt to continue taking 1 tablet (90m) daily, except 1/2 tablet (2.538m on Sundays and Wednesdays. Recheck INR 1 week-per unsteadiness. [facility] [phone]

## 2022-04-13 DIAGNOSIS — H8111 Benign paroxysmal vertigo, right ear: Secondary | ICD-10-CM | POA: Diagnosis not present

## 2022-04-13 DIAGNOSIS — I10 Essential (primary) hypertension: Secondary | ICD-10-CM | POA: Diagnosis not present

## 2022-04-13 DIAGNOSIS — E119 Type 2 diabetes mellitus without complications: Secondary | ICD-10-CM | POA: Diagnosis not present

## 2022-04-18 ENCOUNTER — Ambulatory Visit (INDEPENDENT_AMBULATORY_CARE_PROVIDER_SITE_OTHER): Payer: Medicare Other | Admitting: *Deleted

## 2022-04-18 DIAGNOSIS — I482 Chronic atrial fibrillation, unspecified: Secondary | ICD-10-CM | POA: Diagnosis not present

## 2022-04-18 DIAGNOSIS — Z7901 Long term (current) use of anticoagulants: Secondary | ICD-10-CM

## 2022-04-18 LAB — POCT INR: INR: 2.3 (ref 2.0–3.0)

## 2022-04-25 DIAGNOSIS — Z7901 Long term (current) use of anticoagulants: Secondary | ICD-10-CM | POA: Diagnosis not present

## 2022-04-25 LAB — POCT INR: INR: 2.7 (ref 2.0–3.0)

## 2022-04-26 ENCOUNTER — Ambulatory Visit (INDEPENDENT_AMBULATORY_CARE_PROVIDER_SITE_OTHER): Payer: Medicare Other

## 2022-04-26 DIAGNOSIS — I482 Chronic atrial fibrillation, unspecified: Secondary | ICD-10-CM | POA: Diagnosis not present

## 2022-04-26 DIAGNOSIS — Z7901 Long term (current) use of anticoagulants: Secondary | ICD-10-CM | POA: Diagnosis not present

## 2022-04-26 NOTE — Patient Instructions (Signed)
Description   Spoke with pt and instructed pt to continue taking 1 tablet (46m) daily, except 1/2 tablet (2.533m on Sundays and Wednesdays. Recheck INR 1 week. [facility] [phone]

## 2022-05-03 ENCOUNTER — Ambulatory Visit (INDEPENDENT_AMBULATORY_CARE_PROVIDER_SITE_OTHER): Payer: Medicare Other

## 2022-05-03 DIAGNOSIS — I482 Chronic atrial fibrillation, unspecified: Secondary | ICD-10-CM | POA: Diagnosis not present

## 2022-05-03 DIAGNOSIS — Z7901 Long term (current) use of anticoagulants: Secondary | ICD-10-CM | POA: Diagnosis not present

## 2022-05-03 LAB — POCT INR: INR: 2.3 (ref 2.0–3.0)

## 2022-05-03 NOTE — Patient Instructions (Signed)
Description   Spoke with pt and instructed pt to continue taking 1 tablet (46m) daily, except 1/2 tablet (2.533m on Sundays and Wednesdays. Recheck INR 1 week. [facility] [phone]

## 2022-05-07 ENCOUNTER — Ambulatory Visit: Payer: Medicare Other | Admitting: Podiatry

## 2022-05-10 ENCOUNTER — Telehealth: Payer: Self-pay | Admitting: Cardiovascular Disease

## 2022-05-10 ENCOUNTER — Telehealth: Payer: Self-pay

## 2022-05-10 ENCOUNTER — Ambulatory Visit (INDEPENDENT_AMBULATORY_CARE_PROVIDER_SITE_OTHER): Payer: Medicare Other | Admitting: Cardiology

## 2022-05-10 DIAGNOSIS — I482 Chronic atrial fibrillation, unspecified: Secondary | ICD-10-CM | POA: Diagnosis not present

## 2022-05-10 DIAGNOSIS — Z7901 Long term (current) use of anticoagulants: Secondary | ICD-10-CM

## 2022-05-10 DIAGNOSIS — Z5181 Encounter for therapeutic drug level monitoring: Secondary | ICD-10-CM | POA: Diagnosis not present

## 2022-05-10 LAB — POCT INR: INR: 2.3 (ref 2.0–3.0)

## 2022-05-10 NOTE — Telephone Encounter (Signed)
Called the patient to check up on her and to let her know that Dr. Fletcher Anon said "thank you"

## 2022-05-10 NOTE — Telephone Encounter (Signed)
Pt would like to wish Dr. Fletcher Anon a Happy Birthday!!!

## 2022-05-10 NOTE — Telephone Encounter (Signed)
Thank you

## 2022-05-10 NOTE — Telephone Encounter (Signed)
Spoke to patient and reminded her check INR

## 2022-05-14 DIAGNOSIS — I5022 Chronic systolic (congestive) heart failure: Secondary | ICD-10-CM | POA: Diagnosis not present

## 2022-05-14 DIAGNOSIS — I428 Other cardiomyopathies: Secondary | ICD-10-CM | POA: Diagnosis not present

## 2022-05-14 DIAGNOSIS — I482 Chronic atrial fibrillation, unspecified: Secondary | ICD-10-CM | POA: Diagnosis not present

## 2022-05-14 DIAGNOSIS — Z7901 Long term (current) use of anticoagulants: Secondary | ICD-10-CM | POA: Diagnosis not present

## 2022-05-14 DIAGNOSIS — Z79899 Other long term (current) drug therapy: Secondary | ICD-10-CM | POA: Diagnosis not present

## 2022-05-14 DIAGNOSIS — Z794 Long term (current) use of insulin: Secondary | ICD-10-CM | POA: Diagnosis not present

## 2022-05-14 DIAGNOSIS — I2510 Atherosclerotic heart disease of native coronary artery without angina pectoris: Secondary | ICD-10-CM | POA: Diagnosis not present

## 2022-05-14 DIAGNOSIS — E119 Type 2 diabetes mellitus without complications: Secondary | ICD-10-CM | POA: Diagnosis not present

## 2022-05-14 DIAGNOSIS — J449 Chronic obstructive pulmonary disease, unspecified: Secondary | ICD-10-CM | POA: Diagnosis not present

## 2022-05-16 ENCOUNTER — Telehealth: Payer: Self-pay | Admitting: *Deleted

## 2022-05-16 NOTE — Telephone Encounter (Signed)
Called pt since she is due to have INR self monitoring done; pt states she is out town and will perform when she gets back home tomorrow.

## 2022-05-18 ENCOUNTER — Ambulatory Visit (INDEPENDENT_AMBULATORY_CARE_PROVIDER_SITE_OTHER): Payer: Medicare Other | Admitting: *Deleted

## 2022-05-18 DIAGNOSIS — I482 Chronic atrial fibrillation, unspecified: Secondary | ICD-10-CM | POA: Diagnosis not present

## 2022-05-18 DIAGNOSIS — Z7901 Long term (current) use of anticoagulants: Secondary | ICD-10-CM | POA: Diagnosis not present

## 2022-05-18 LAB — POCT INR: INR: 2.3 (ref 2.0–3.0)

## 2022-05-18 NOTE — Patient Instructions (Signed)
Description   *PT NEEDS CARDIOLOGIST APPT OR LET NEW CARDIOLOGIST FOLLOW* Spoke with pt and instructed pt to continue taking 1 tablet (23m) daily, except 1/2 tablet (2.568m on Sundays and Wednesdays. Recheck INR 1 week. [facility] [phone]

## 2022-05-22 DIAGNOSIS — J449 Chronic obstructive pulmonary disease, unspecified: Secondary | ICD-10-CM | POA: Diagnosis not present

## 2022-05-22 DIAGNOSIS — I2729 Other secondary pulmonary hypertension: Secondary | ICD-10-CM | POA: Diagnosis not present

## 2022-05-22 DIAGNOSIS — E785 Hyperlipidemia, unspecified: Secondary | ICD-10-CM | POA: Diagnosis not present

## 2022-05-22 DIAGNOSIS — I10 Essential (primary) hypertension: Secondary | ICD-10-CM | POA: Diagnosis not present

## 2022-05-22 DIAGNOSIS — N390 Urinary tract infection, site not specified: Secondary | ICD-10-CM | POA: Diagnosis not present

## 2022-05-22 DIAGNOSIS — Z7984 Long term (current) use of oral hypoglycemic drugs: Secondary | ICD-10-CM | POA: Diagnosis not present

## 2022-05-22 DIAGNOSIS — M545 Low back pain, unspecified: Secondary | ICD-10-CM | POA: Diagnosis not present

## 2022-05-22 DIAGNOSIS — N184 Chronic kidney disease, stage 4 (severe): Secondary | ICD-10-CM | POA: Diagnosis not present

## 2022-05-22 DIAGNOSIS — I130 Hypertensive heart and chronic kidney disease with heart failure and stage 1 through stage 4 chronic kidney disease, or unspecified chronic kidney disease: Secondary | ICD-10-CM | POA: Diagnosis not present

## 2022-05-22 DIAGNOSIS — Z79899 Other long term (current) drug therapy: Secondary | ICD-10-CM | POA: Diagnosis not present

## 2022-05-22 DIAGNOSIS — Z7982 Long term (current) use of aspirin: Secondary | ICD-10-CM | POA: Diagnosis not present

## 2022-05-22 DIAGNOSIS — E1122 Type 2 diabetes mellitus with diabetic chronic kidney disease: Secondary | ICD-10-CM | POA: Diagnosis not present

## 2022-05-22 DIAGNOSIS — I428 Other cardiomyopathies: Secondary | ICD-10-CM | POA: Diagnosis not present

## 2022-05-22 DIAGNOSIS — R350 Frequency of micturition: Secondary | ICD-10-CM | POA: Diagnosis not present

## 2022-05-23 ENCOUNTER — Ambulatory Visit: Payer: Medicare Other | Admitting: Podiatrist

## 2022-05-25 ENCOUNTER — Telehealth: Payer: Self-pay

## 2022-05-25 ENCOUNTER — Ambulatory Visit (INDEPENDENT_AMBULATORY_CARE_PROVIDER_SITE_OTHER): Payer: Medicare Other | Admitting: *Deleted

## 2022-05-25 DIAGNOSIS — Z7901 Long term (current) use of anticoagulants: Secondary | ICD-10-CM | POA: Diagnosis not present

## 2022-05-25 DIAGNOSIS — I482 Chronic atrial fibrillation, unspecified: Secondary | ICD-10-CM | POA: Diagnosis not present

## 2022-05-25 LAB — POCT INR: INR: 2.5 (ref 2.0–3.0)

## 2022-05-25 NOTE — Telephone Encounter (Signed)
Reminded patient to check INR.  While on the phone, spoke about Cardiology f/u/.  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  She will check to see if they will manage Coumadin.

## 2022-05-25 NOTE — Patient Instructions (Signed)
Description   *PT NEEDS CARDIOLOGIST APPT OR LET NEW CARDIOLOGIST FOLLOW* Spoke with pt and instructed pt to continue taking 1 tablet (5mg) daily, except 1/2 tablet (2.5mg) on Sundays and Wednesdays. Recheck INR 1 week. [facility] [phone]      

## 2022-05-29 ENCOUNTER — Ambulatory Visit: Payer: Medicare Other | Admitting: Podiatrist

## 2022-05-31 ENCOUNTER — Telehealth: Payer: Self-pay

## 2022-05-31 NOTE — Telephone Encounter (Signed)
Pt called and stated she was started on Bactrim x 5 days and started yesterday evening. Instructed to check INR in the morning and call [facility] Clinic with results. Pt verbalized understanding.

## 2022-06-01 ENCOUNTER — Ambulatory Visit (INDEPENDENT_AMBULATORY_CARE_PROVIDER_SITE_OTHER): Payer: Medicare Other

## 2022-06-01 DIAGNOSIS — Z7901 Long term (current) use of anticoagulants: Secondary | ICD-10-CM | POA: Diagnosis not present

## 2022-06-01 DIAGNOSIS — I482 Chronic atrial fibrillation, unspecified: Secondary | ICD-10-CM | POA: Diagnosis not present

## 2022-06-01 LAB — POCT INR: INR: 2 (ref 2.0–3.0)

## 2022-06-01 NOTE — Patient Instructions (Signed)
Description   *PT NEEDS CARDIOLOGIST APPT OR LET NEW CARDIOLOGIST FOLLOW* Spoke with pt and instructed pt to continue taking 1 tablet (75m) daily, except 1/2 tablet (2.584m on Sundays and Wednesdays. Recheck INR on Tuesday 06/05/22. [facility] [phone]

## 2022-06-04 LAB — POCT INR: INR: 4.8 — AB (ref 2.0–3.0)

## 2022-06-05 ENCOUNTER — Telehealth: Payer: Self-pay

## 2022-06-05 ENCOUNTER — Ambulatory Visit (INDEPENDENT_AMBULATORY_CARE_PROVIDER_SITE_OTHER): Payer: Medicare Other | Admitting: Cardiology

## 2022-06-05 DIAGNOSIS — Z5181 Encounter for therapeutic drug level monitoring: Secondary | ICD-10-CM

## 2022-06-05 DIAGNOSIS — I482 Chronic atrial fibrillation, unspecified: Secondary | ICD-10-CM | POA: Diagnosis not present

## 2022-06-05 DIAGNOSIS — Z7901 Long term (current) use of anticoagulants: Secondary | ICD-10-CM

## 2022-06-05 DIAGNOSIS — N3946 Mixed incontinence: Secondary | ICD-10-CM | POA: Diagnosis not present

## 2022-06-05 DIAGNOSIS — N3020 Other chronic cystitis without hematuria: Secondary | ICD-10-CM | POA: Diagnosis not present

## 2022-06-05 LAB — PROTIME-INR: INR: 4.3 — AB (ref 0.80–1.20)

## 2022-06-05 NOTE — Telephone Encounter (Signed)
Tried to call patient to check INR today, but mailbox full

## 2022-06-11 ENCOUNTER — Ambulatory Visit (INDEPENDENT_AMBULATORY_CARE_PROVIDER_SITE_OTHER): Payer: Medicare Other | Admitting: Internal Medicine

## 2022-06-11 DIAGNOSIS — I482 Chronic atrial fibrillation, unspecified: Secondary | ICD-10-CM | POA: Diagnosis not present

## 2022-06-11 DIAGNOSIS — Z5181 Encounter for therapeutic drug level monitoring: Secondary | ICD-10-CM | POA: Diagnosis not present

## 2022-06-11 DIAGNOSIS — Z7901 Long term (current) use of anticoagulants: Secondary | ICD-10-CM

## 2022-06-11 LAB — POCT INR: INR: 2.3 (ref 2.0–3.0)

## 2022-06-18 ENCOUNTER — Telehealth: Payer: Self-pay

## 2022-06-18 NOTE — Telephone Encounter (Signed)
Tried calling patient to remind of INR.  Mailbox full.

## 2022-06-19 ENCOUNTER — Ambulatory Visit (INDEPENDENT_AMBULATORY_CARE_PROVIDER_SITE_OTHER): Payer: Medicare Other | Admitting: Podiatry

## 2022-06-19 ENCOUNTER — Ambulatory Visit (INDEPENDENT_AMBULATORY_CARE_PROVIDER_SITE_OTHER): Payer: Medicare Other | Admitting: *Deleted

## 2022-06-19 DIAGNOSIS — I482 Chronic atrial fibrillation, unspecified: Secondary | ICD-10-CM | POA: Diagnosis not present

## 2022-06-19 DIAGNOSIS — M79674 Pain in right toe(s): Secondary | ICD-10-CM

## 2022-06-19 DIAGNOSIS — M79675 Pain in left toe(s): Secondary | ICD-10-CM

## 2022-06-19 DIAGNOSIS — B351 Tinea unguium: Secondary | ICD-10-CM

## 2022-06-19 DIAGNOSIS — Z7901 Long term (current) use of anticoagulants: Secondary | ICD-10-CM

## 2022-06-19 DIAGNOSIS — E1140 Type 2 diabetes mellitus with diabetic neuropathy, unspecified: Secondary | ICD-10-CM

## 2022-06-19 DIAGNOSIS — I739 Peripheral vascular disease, unspecified: Secondary | ICD-10-CM

## 2022-06-19 LAB — POCT INR: INR: 3 (ref 2.0–3.0)

## 2022-06-19 NOTE — Progress Notes (Signed)
  Subjective:  Patient ID: Rebekah Stewart, female    DOB: 1935/07/06,  MRN: [id number]  Chief Complaint  Patient presents with   Nail Problem    Diabetic foot care     87 y.o. female presents with the above complaint. History confirmed with patient.  Patient presents for painful thickened elongated nails present on both feet.  She is unable to trim them herself.  She notes that the nails are yellow thickened and there is debris present underneath them.  They are painful to her related to the length and thickness.  She does report a history of diabetes type 2 with peripheral neuropathy.   Objective:  Physical Exam: warm, good capillary refill, nail exam onychomycosis of the toenails, onycholysis, and dystrophic nails, no trophic changes or ulcerative lesions. DP pulses palpable, PT pulses palpable, and protective sensation absent Left Foot: normal exam, no swelling, tenderness, instability; ligaments intact, full range of motion of all ankle/foot joints  Right Foot: normal exam, no swelling, tenderness, instability; ligaments intact, full range of motion of all ankle/foot joints   No images are attached to the encounter.  Assessment:   1. Pain due to onychomycosis of toenails of both feet   2. Type 2 diabetes, controlled, with neuropathy (Rebekah Stewart)   3. PVD (peripheral vascular disease) (Rebekah Stewart)      Plan:  Patient was evaluated and treated and all questions answered.  Onychomycosis with pain  -Nails palliatively debrided as below. -Educated on self-care  Procedure: Nail Debridement Rationale: Pain Type of Debridement: manual, sharp debridement. Instrumentation: Nail nipper, rotary burr. Number of Nails: 10  Return in about 3 months (around 09/18/2022) for [facility].         Everitt Amber, DPM [facility]

## 2022-06-19 NOTE — Patient Instructions (Signed)
Description   *PT NEEDS CARDIOLOGIST APPT (last seen 11/15/2020) OR LET NEW CARDIOLOGIST FOLLOW* Spoke with pt and instructed pt to take 1/2 tablet tonight then continue taking 1 tablet (61m) daily, except 1/2 tablet (2.565m on Sundays and Wednesdays. Recheck INR on Tuesday 06/26/22. [facility] [phone]

## 2022-06-26 ENCOUNTER — Telehealth: Payer: Self-pay

## 2022-06-26 NOTE — Telephone Encounter (Signed)
Tried calling pt to check INR, mailbox full

## 2022-06-27 ENCOUNTER — Telehealth: Payer: Self-pay | Admitting: *Deleted

## 2022-06-27 ENCOUNTER — Ambulatory Visit (INDEPENDENT_AMBULATORY_CARE_PROVIDER_SITE_OTHER): Payer: Medicare Other

## 2022-06-27 DIAGNOSIS — I482 Chronic atrial fibrillation, unspecified: Secondary | ICD-10-CM

## 2022-06-27 DIAGNOSIS — Z7901 Long term (current) use of anticoagulants: Secondary | ICD-10-CM

## 2022-06-27 LAB — POCT INR: INR: 2.2 (ref 2.0–3.0)

## 2022-06-27 NOTE — Telephone Encounter (Signed)
Called pt since she is overdue for INR monitoring; unable to leave a message.

## 2022-06-27 NOTE — Patient Instructions (Signed)
Description   *PT NEEDS CARDIOLOGIST APPT (last seen 11/15/2020) OR LET NEW CARDIOLOGIST FOLLOW* Spoke with pt and instructed pt to continue taking 1 tablet (93m) daily, except 1/2 tablet (2.529m on Sundays and Wednesdays.  Recheck INR in 1 week [facility] [phone]

## 2022-07-18 ENCOUNTER — Telehealth: Payer: Self-pay

## 2022-07-18 DIAGNOSIS — Z7901 Long term (current) use of anticoagulants: Secondary | ICD-10-CM | POA: Diagnosis not present

## 2022-07-18 NOTE — Telephone Encounter (Signed)
Called pt because we received pt's INR results via fax. Pt stated her INR is now managed by difference provider. Anticoagulation encounter was resolved by Frederik Schmidt, RN

## 2022-07-25 DIAGNOSIS — R0689 Other abnormalities of breathing: Secondary | ICD-10-CM | POA: Diagnosis not present

## 2022-07-25 DIAGNOSIS — J449 Chronic obstructive pulmonary disease, unspecified: Secondary | ICD-10-CM | POA: Diagnosis not present

## 2022-07-25 DIAGNOSIS — R0600 Dyspnea, unspecified: Secondary | ICD-10-CM | POA: Diagnosis not present

## 2022-07-25 DIAGNOSIS — I5032 Chronic diastolic (congestive) heart failure: Secondary | ICD-10-CM | POA: Diagnosis not present

## 2022-07-25 DIAGNOSIS — G4733 Obstructive sleep apnea (adult) (pediatric): Secondary | ICD-10-CM | POA: Diagnosis not present

## 2022-07-25 DIAGNOSIS — I2723 Pulmonary hypertension due to lung diseases and hypoxia: Secondary | ICD-10-CM | POA: Diagnosis not present

## 2022-07-25 DIAGNOSIS — Z23 Encounter for immunization: Secondary | ICD-10-CM | POA: Diagnosis not present

## 2022-08-15 DIAGNOSIS — Z7901 Long term (current) use of anticoagulants: Secondary | ICD-10-CM | POA: Diagnosis not present

## 2022-08-21 DIAGNOSIS — J302 Other seasonal allergic rhinitis: Secondary | ICD-10-CM | POA: Diagnosis not present

## 2022-08-21 DIAGNOSIS — J4489 Other specified chronic obstructive pulmonary disease: Secondary | ICD-10-CM | POA: Diagnosis not present

## 2022-08-21 DIAGNOSIS — Z7984 Long term (current) use of oral hypoglycemic drugs: Secondary | ICD-10-CM | POA: Diagnosis not present

## 2022-08-21 DIAGNOSIS — I129 Hypertensive chronic kidney disease with stage 1 through stage 4 chronic kidney disease, or unspecified chronic kidney disease: Secondary | ICD-10-CM | POA: Diagnosis not present

## 2022-08-21 DIAGNOSIS — E785 Hyperlipidemia, unspecified: Secondary | ICD-10-CM | POA: Diagnosis not present

## 2022-08-21 DIAGNOSIS — L989 Disorder of the skin and subcutaneous tissue, unspecified: Secondary | ICD-10-CM | POA: Diagnosis not present

## 2022-08-21 DIAGNOSIS — I10 Essential (primary) hypertension: Secondary | ICD-10-CM | POA: Diagnosis not present

## 2022-08-21 DIAGNOSIS — N184 Chronic kidney disease, stage 4 (severe): Secondary | ICD-10-CM | POA: Diagnosis not present

## 2022-08-21 DIAGNOSIS — Z79899 Other long term (current) drug therapy: Secondary | ICD-10-CM | POA: Diagnosis not present

## 2022-08-21 DIAGNOSIS — E1122 Type 2 diabetes mellitus with diabetic chronic kidney disease: Secondary | ICD-10-CM | POA: Diagnosis not present

## 2022-09-13 DIAGNOSIS — Z7901 Long term (current) use of anticoagulants: Secondary | ICD-10-CM | POA: Diagnosis not present

## 2022-09-17 ENCOUNTER — Ambulatory Visit (INDEPENDENT_AMBULATORY_CARE_PROVIDER_SITE_OTHER): Payer: Medicare Other | Admitting: Podiatry

## 2022-09-17 DIAGNOSIS — B351 Tinea unguium: Secondary | ICD-10-CM

## 2022-09-17 DIAGNOSIS — M79675 Pain in left toe(s): Secondary | ICD-10-CM | POA: Diagnosis not present

## 2022-09-17 DIAGNOSIS — M79674 Pain in right toe(s): Secondary | ICD-10-CM

## 2022-09-17 DIAGNOSIS — E1140 Type 2 diabetes mellitus with diabetic neuropathy, unspecified: Secondary | ICD-10-CM

## 2022-09-17 DIAGNOSIS — I739 Peripheral vascular disease, unspecified: Secondary | ICD-10-CM

## 2022-09-17 NOTE — Progress Notes (Signed)
  Subjective:  Patient ID: Rebekah Stewart, female    DOB: Sep 13, 1935,  MRN: [id number]  Chief Complaint  Patient presents with   Nail Problem    Diabetic Foot Care     87 y.o. female presents with the above complaint. History confirmed with patient.  Patient presents for painful thickened elongated nails present on both feet.  She is unable to trim them herself.  She notes that the nails are yellow thickened and there is debris present underneath them.  They are painful to her related to the length and thickness.  She does report a history of diabetes type 2 with peripheral neuropathy.   Objective:  Physical Exam: warm, good capillary refill, nail exam onychomycosis of the toenails, onycholysis, and dystrophic nails, no trophic changes or ulcerative lesions. DP pulses palpable, PT pulses palpable, and protective sensation absent Left Foot: normal exam, no swelling, tenderness, instability; ligaments intact, full range of motion of all ankle/foot joints  Right Foot: normal exam, no swelling, tenderness, instability; ligaments intact, full range of motion of all ankle/foot joints   No images are attached to the encounter.  Assessment:   1. Pain due to onychomycosis of toenails of both feet   2. Type 2 diabetes, controlled, with neuropathy ([place])   3. PVD (peripheral vascular disease) ([place])       Plan:  Patient was evaluated and treated and all questions answered.  Onychomycosis with pain  -Nails palliatively debrided as below. -Educated on self-care  Procedure: Nail Debridement Rationale: Pain Type of Debridement: manual, sharp debridement. Instrumentation: Nail nipper, rotary burr. Number of Nails: 10  Return in about 3 months (around 12/17/2022) for [facility].         Everitt Amber, DPM [facility]

## 2022-09-18 DIAGNOSIS — D2239 Melanocytic nevi of other parts of face: Secondary | ICD-10-CM | POA: Diagnosis not present

## 2022-09-18 DIAGNOSIS — L853 Xerosis cutis: Secondary | ICD-10-CM | POA: Diagnosis not present

## 2022-09-18 DIAGNOSIS — B372 Candidiasis of skin and nail: Secondary | ICD-10-CM | POA: Diagnosis not present

## 2022-09-18 DIAGNOSIS — L820 Inflamed seborrheic keratosis: Secondary | ICD-10-CM | POA: Diagnosis not present

## 2022-09-18 DIAGNOSIS — D225 Melanocytic nevi of trunk: Secondary | ICD-10-CM | POA: Diagnosis not present

## 2022-09-18 DIAGNOSIS — L570 Actinic keratosis: Secondary | ICD-10-CM | POA: Diagnosis not present

## 2022-09-23 DIAGNOSIS — H1031 Unspecified acute conjunctivitis, right eye: Secondary | ICD-10-CM | POA: Diagnosis not present

## 2022-09-23 DIAGNOSIS — Z760 Encounter for issue of repeat prescription: Secondary | ICD-10-CM | POA: Diagnosis not present

## 2022-09-23 DIAGNOSIS — R42 Dizziness and giddiness: Secondary | ICD-10-CM | POA: Diagnosis not present

## 2022-09-23 DIAGNOSIS — H9201 Otalgia, right ear: Secondary | ICD-10-CM | POA: Diagnosis not present

## 2022-09-25 DIAGNOSIS — H5713 Ocular pain, bilateral: Secondary | ICD-10-CM | POA: Diagnosis not present

## 2022-09-26 DIAGNOSIS — I110 Hypertensive heart disease with heart failure: Secondary | ICD-10-CM | POA: Diagnosis not present

## 2022-09-26 DIAGNOSIS — I10 Essential (primary) hypertension: Secondary | ICD-10-CM | POA: Diagnosis not present

## 2022-09-26 DIAGNOSIS — E119 Type 2 diabetes mellitus without complications: Secondary | ICD-10-CM | POA: Diagnosis not present

## 2022-09-26 DIAGNOSIS — I509 Heart failure, unspecified: Secondary | ICD-10-CM | POA: Diagnosis not present

## 2022-09-26 DIAGNOSIS — I4891 Unspecified atrial fibrillation: Secondary | ICD-10-CM | POA: Diagnosis not present

## 2022-09-26 DIAGNOSIS — L03211 Cellulitis of face: Secondary | ICD-10-CM | POA: Diagnosis not present

## 2022-09-28 DIAGNOSIS — L03211 Cellulitis of face: Secondary | ICD-10-CM | POA: Diagnosis not present

## 2022-12-24 ENCOUNTER — Ambulatory Visit: Payer: Medicare Other | Admitting: Podiatry

## 2023-01-08 ENCOUNTER — Other Ambulatory Visit: Payer: Medicare Other

## 2023-01-08 ENCOUNTER — Ambulatory Visit: Payer: Medicare Other | Admitting: Podiatry

## 2023-02-12 ENCOUNTER — Ambulatory Visit: Payer: Medicare Other

## 2023-02-12 ENCOUNTER — Ambulatory Visit (INDEPENDENT_AMBULATORY_CARE_PROVIDER_SITE_OTHER): Payer: Medicare Other | Admitting: Podiatry

## 2023-02-12 DIAGNOSIS — M79675 Pain in left toe(s): Secondary | ICD-10-CM | POA: Diagnosis not present

## 2023-02-12 DIAGNOSIS — B351 Tinea unguium: Secondary | ICD-10-CM

## 2023-02-12 DIAGNOSIS — I739 Peripheral vascular disease, unspecified: Secondary | ICD-10-CM

## 2023-02-12 DIAGNOSIS — M79674 Pain in right toe(s): Secondary | ICD-10-CM

## 2023-02-12 DIAGNOSIS — E1140 Type 2 diabetes mellitus with diabetic neuropathy, unspecified: Secondary | ICD-10-CM

## 2023-02-12 NOTE — Progress Notes (Addendum)
  Subjective:  Patient ID: Rebekah Stewart, female    DOB: 1935-09-30,  MRN: [id number]  Chief Complaint  Patient presents with   Diabetic foot care     88 y.o. female presents with the above complaint. History confirmed with patient.  Patient presents for painful thickened elongated nails present on both feet.  She is unable to trim them herself.  She notes that the nails are yellow thickened and there is debris present underneath them.  They are painful to her related to the length and thickness.  She does report a history of diabetes type 2 with peripheral neuropathy.   Objective:  Physical Exam: warm, good capillary refill, nail exam onychomycosis of the toenails, onycholysis, and dystrophic nails, no trophic changes or ulcerative lesions. DP pulses palpable, PT pulses palpable, and protective sensation absent Left Foot: normal exam, no swelling, tenderness, instability; ligaments intact, full range of motion of all ankle/foot joints  Right Foot: normal exam, no swelling, tenderness, instability; ligaments intact, full range of motion of all ankle/foot joints   No images are attached to the encounter.  Assessment:   1. Pain due to onychomycosis of toenails of both feet   2. PVD (peripheral vascular disease) (HCC)   3. Type 2 diabetes, controlled, with neuropathy (HCC)        Plan:  Patient was evaluated and treated and all questions answered.  Onychomycosis with pain  -Nails palliatively debrided as below. -Educated on self-care  Procedure: Nail Debridement Rationale: Pain Type of Debridement: manual, sharp debridement. Instrumentation: Nail nipper, rotary burr. Number of Nails: 10  Return in about 3 months (around 05/15/2023) for [facility].         Corinna Gab, DPM [facility]

## 2023-02-12 NOTE — Progress Notes (Incomplete)
Patient presents to the office today for diabetic shoe and insole measuring.  Patient was measured with brannock device to determine size and width for 1 pair of extra depth shoes and foam casted for 3 pair of insoles.   ABN signed.   Documentation of medical necessity will be sent to patient's treating diabetic doctor to verify and sign.   Patient's diabetic provider: Constance Haw, NP  NPI: [id number]   Shoes and insoles will be ordered at that time and patient will be notified for an appointment for fitting when they arrive.   Brannock measurement: ***  Patient shoe selection-   1st   Shoe choice:   ***  Shoe size ordered: ***

## 2023-05-15 ENCOUNTER — Ambulatory Visit: Payer: Medicare Other | Admitting: Podiatry

## 2023-05-29 ENCOUNTER — Ambulatory Visit (INDEPENDENT_AMBULATORY_CARE_PROVIDER_SITE_OTHER): Payer: Medicare Other | Admitting: Podiatry

## 2023-05-29 DIAGNOSIS — M2042 Other hammer toe(s) (acquired), left foot: Secondary | ICD-10-CM

## 2023-05-29 DIAGNOSIS — E119 Type 2 diabetes mellitus without complications: Secondary | ICD-10-CM | POA: Diagnosis not present

## 2023-05-29 DIAGNOSIS — M2041 Other hammer toe(s) (acquired), right foot: Secondary | ICD-10-CM

## 2023-05-29 DIAGNOSIS — E1140 Type 2 diabetes mellitus with diabetic neuropathy, unspecified: Secondary | ICD-10-CM

## 2023-05-29 DIAGNOSIS — M79675 Pain in left toe(s): Secondary | ICD-10-CM | POA: Diagnosis not present

## 2023-05-29 DIAGNOSIS — B351 Tinea unguium: Secondary | ICD-10-CM

## 2023-05-29 DIAGNOSIS — M79674 Pain in right toe(s): Secondary | ICD-10-CM

## 2023-05-29 NOTE — Progress Notes (Signed)
Subjective:  Patient ID: Rebekah Stewart, female    DOB: 02/22/1935,  MRN: [id number]  Rebekah Stewart presents to clinic today for:  Chief Complaint  Patient presents with   Nail Problem    Diabetic Foot Care-nail trim    Patient notes nails are thick, discolored, elongated and painful in shoegear when trying to ambulate.  She is also inquiring about her diabetic shoes.  Apparently the shoe consult occurred a few months ago but she never heard back that her shoes were in.  PCP is [facility].  Date last seen 05/20/2023  Past Medical History:  Diagnosis Date   Arthritis of back    lumbar   Atrial fibrillation (HCC) 2010   Atrial fibrillation [facility])    Atrial fibrillation (HCC)    Chronic diastolic heart failure (HCC)    Colonic polyp    Compression fracture of thoracic vertebra (HCC)    Congestive heart failure, unspecified    Coronary artery disease    mild non obstructive disease per cardiac cath in 2010   Degeneration of lumbar or lumbosacral intervertebral disc    Diverticulosis    DM2 (diabetes mellitus, type 2) (HCC)    Hemorrhoids    HLD (hyperlipidemia)    HTN (hypertension)    diagnosed when she was in her 30s. Refractory. No RAS by [place] in 2010   Long-term (current) use of anticoagulants    Lumbosacral spondylosis without myelopathy    Osteoarthrosis, unspecified whether generalized or localized, unspecified site    Pain in thoracic spine    Swelling of limb    UTI (lower urinary tract infection)    Varicose veins of lower extremities with other complications    Vitamin D deficiency     Allergies  Allergen Reactions   Bee Venom Shortness Of Breath and Swelling   Sulfamethoxazole-Trimethoprim Other (See Comments)    Sore throat and rash in mouth   Iodinated Contrast Media Itching   Penicillins Swelling   Sacubitril-Valsartan     Hypotension at low dose   Tape     Pulls the skin    Review of Systems: Negative except as noted in the  HPI.  Objective:  There were no vitals filed for this visit.  Rebekah Stewart is a pleasant 88 y.o. female in NAD. AAO x 3.  Vascular Examination: Capillary refill time is 3-5 seconds to toes bilateral.  Trace palpable pedal pulses b/l LE. Digital hair sparse b/l.  Skin temperature gradient WNL b/l. No varicosities b/l. No cyanosis noted b/l.   Dermatological Examination: Pedal skin with normal turgor, texture and tone b/l. No open wounds. No interdigital macerations b/l. Toenails x10 are 3mm thick, discolored, dystrophic with subungual debris. There is pain with compression of the nail plates.  They are elongated x10  Neurological Examination: Protective sensation diminished bilateral forefoot LE.   Musculoskeletal Examination: Muscle strength 5/5 to all LE muscle groups b/l.  Lesser hammertoes present  Assessment/Plan: 1. Pain due to onychomycosis of toenails of both feet   2. Type 2 diabetes, controlled, with neuropathy (HCC)   3. Hammer toes of both feet   4. Encounter for diabetic foot exam (HCC)    The mycotic toenails were sharply debrided x10 with sterile nail nippers and a power debriding burr to decrease bulk/thickness and length.    After checking the status of her recent diabetic shoe consult a couple months ago, it does not appear that the order was completed/fulfilled.  Therefore, a new  order was placed for diabetic shoes with 3 pairs of diabetic insoles.  Will try to get her scheduled within the next month with our pedorthist when she is at the [place] office.  She qualifies for diabetic shoes due to diabetes and hammertoe deformities.  Patient does need to be seen by an MD/DO for her diabetes management within the last 3 months to be able to proceed with the diabetic shoes.  Return in about 3 months (around 08/29/2023) for [facility].   Clerance Lav, DPM, FACFAS [facility]     [address] [place], [place]  [address]                Office [phone]  Fax [phone]

## 2023-06-25 ENCOUNTER — Ambulatory Visit: Payer: Medicare Other

## 2023-06-25 DIAGNOSIS — M2041 Other hammer toe(s) (acquired), right foot: Secondary | ICD-10-CM

## 2023-06-25 DIAGNOSIS — M2010 Hallux valgus (acquired), unspecified foot: Secondary | ICD-10-CM

## 2023-06-25 DIAGNOSIS — E1140 Type 2 diabetes mellitus with diabetic neuropathy, unspecified: Secondary | ICD-10-CM

## 2023-06-25 DIAGNOSIS — M2141 Flat foot [pes planus] (acquired), right foot: Secondary | ICD-10-CM

## 2023-06-25 NOTE — Progress Notes (Signed)
Patient presents to the office today for diabetic shoe and insole measuring.  Patient was measured with brannock device to determine size and width for 1 pair of extra depth shoes and foam casted for 3 pair of insoles.   Documentation of medical necessity will be sent to patient's treating diabetic doctor to verify and sign.   Patient's diabetic provider: [name] NP / Dr [name] will sign off per Selena Batten at [facility]   Shoes and insoles will be ordered at that time and patient will be notified for an appointment for fitting when they arrive.   Shoe size (per patient): 9 Brannock measurement: 8 Patient shoe selection- Shoe choice:   [id number] / 844  Shoe size ordered: 8.5WD ABN and Financials signed  [name] Cped, CFo, CFm

## 2023-09-03 ENCOUNTER — Other Ambulatory Visit: Payer: Medicare Other

## 2023-09-04 ENCOUNTER — Ambulatory Visit (INDEPENDENT_AMBULATORY_CARE_PROVIDER_SITE_OTHER): Payer: Medicare Other | Admitting: Podiatry

## 2023-09-04 DIAGNOSIS — B351 Tinea unguium: Secondary | ICD-10-CM | POA: Diagnosis not present

## 2023-09-04 DIAGNOSIS — M2042 Other hammer toe(s) (acquired), left foot: Secondary | ICD-10-CM

## 2023-09-04 DIAGNOSIS — M2141 Flat foot [pes planus] (acquired), right foot: Secondary | ICD-10-CM | POA: Diagnosis not present

## 2023-09-04 DIAGNOSIS — M2010 Hallux valgus (acquired), unspecified foot: Secondary | ICD-10-CM | POA: Diagnosis not present

## 2023-09-04 DIAGNOSIS — M79675 Pain in left toe(s): Secondary | ICD-10-CM

## 2023-09-04 DIAGNOSIS — M2142 Flat foot [pes planus] (acquired), left foot: Secondary | ICD-10-CM

## 2023-09-04 DIAGNOSIS — M2041 Other hammer toe(s) (acquired), right foot: Secondary | ICD-10-CM | POA: Diagnosis not present

## 2023-09-04 DIAGNOSIS — I739 Peripheral vascular disease, unspecified: Secondary | ICD-10-CM

## 2023-09-04 DIAGNOSIS — M79674 Pain in right toe(s): Secondary | ICD-10-CM

## 2023-09-04 DIAGNOSIS — E1140 Type 2 diabetes mellitus with diabetic neuropathy, unspecified: Secondary | ICD-10-CM

## 2023-09-04 NOTE — Progress Notes (Unsigned)
Exchange for bigger SZ needed patient will be called when in, After wearing for a bit today patient has concerns that 8.5 is too short ordered a 9 for exchange  Rebekah Stewart Cped, CFo, CFm

## 2023-09-05 NOTE — Progress Notes (Signed)
Subjective:  Patient ID: Rebekah Stewart, female    DOB: [birth year],  MRN: [id number]  Briseida Chaudoin presents to clinic today for:  Chief Complaint  Patient presents with   Diabetes    DFC A1C - 7.3 OR 6.7 CAN'T REMEMBER    Patient notes nails are thick, discolored, elongated and painful in shoegear when trying to ambulate.  Patient is also here for fitting and dispensing of diabetic shoes.  PCP is Cain Sieve, DO.  Past Medical History:  Diagnosis Date   Arthritis of back    lumbar   Atrial fibrillation (HCC) 2010   Atrial fibrillation (HCC)    Atrial fibrillation (HCC)    Chronic diastolic heart failure (HCC)    Colonic polyp    Compression fracture of thoracic vertebra (HCC)    Congestive heart failure, unspecified    Coronary artery disease    mild non obstructive disease per cardiac cath in 2010   Degeneration of lumbar or lumbosacral intervertebral disc    Diverticulosis    DM2 (diabetes mellitus, type 2) (HCC)    Hemorrhoids    HLD (hyperlipidemia)    HTN (hypertension)    diagnosed when she was in her 30s. Refractory. No RAS by [place] in 2010   Long-term (current) use of anticoagulants    Lumbosacral spondylosis without myelopathy    Osteoarthrosis, unspecified whether generalized or localized, unspecified site    Pain in thoracic spine    Swelling of limb    UTI (lower urinary tract infection)    Varicose veins of lower extremities with other complications    Vitamin D deficiency     Past Surgical History:  Procedure Laterality Date   ADENOIDECTOMY     CARDIAC CATHETERIZATION  2010   At [facility]. Mild nonobstructive CAD   CORONARY ANGIOPLASTY     EYE SURGERY     TONSILLECTOMY      Allergies  Allergen Reactions   Bee Venom Shortness Of Breath and Swelling   Sulfamethoxazole-Trimethoprim Other (See Comments)    Sore throat and rash in mouth   Iodinated Contrast Media Itching   Penicillins Swelling   Sacubitril-Valsartan      Hypotension at low dose   Tape     Pulls the skin   Review of Systems: Negative except as noted in the HPI.  Objective:  Rebekah Stewart is a pleasant 88 y.o. female in NAD. AAO x 3.  Vascular Examination: Capillary refill time is 3-5 seconds to toes bilateral. Palpable pedal pulses b/l LE. Digital hair present b/l.  Skin temperature gradient WNL b/l. No varicosities b/l. No cyanosis noted b/l.   Dermatological Examination: Pedal skin with normal turgor, texture and tone b/l. No open wounds. No interdigital macerations b/l. Toenails x10 are 3mm thick, discolored, dystrophic with subungual debris. There is pain with compression of the nail plates.  They are elongated x10  Assessment/Plan: 1. Type 2 diabetes, controlled, with neuropathy (HCC)   2. Hammer toes of both feet   3. Pes planus of both feet   4. PVD (peripheral vascular disease) (HCC)   5. Acquired hallux valgus, unspecified laterality   6. Pain due to onychomycosis of toenails of both feet    The mycotic toenails were sharply debrided x10 with sterile nail nippers and a power debriding burr to decrease bulk/thickness and length.    Diabetic shoes were fitted today, but her toes are at the end of the shoes once weight bearing.  Will  need to exchange for 1/2-size longer.  Notified our pedorthist and she will arrange for different size.   F/u 3 months.   Clerance Lav, DPM, FACFAS [facility]     [address] [place], [place] [address]                Office [phone]  Fax [phone]

## 2023-12-04 ENCOUNTER — Ambulatory Visit: Payer: Medicare Other | Admitting: Podiatry

## 2024-01-22 ENCOUNTER — Ambulatory Visit: Admitting: Podiatry

## 2024-02-19 ENCOUNTER — Ambulatory Visit: Admitting: Podiatry

## 2024-03-19 ENCOUNTER — Ambulatory Visit (INDEPENDENT_AMBULATORY_CARE_PROVIDER_SITE_OTHER): Admitting: Podiatry

## 2024-03-19 DIAGNOSIS — M79675 Pain in left toe(s): Secondary | ICD-10-CM | POA: Diagnosis not present

## 2024-03-19 DIAGNOSIS — B351 Tinea unguium: Secondary | ICD-10-CM

## 2024-03-19 DIAGNOSIS — M79674 Pain in right toe(s): Secondary | ICD-10-CM

## 2024-03-19 NOTE — Progress Notes (Unsigned)
 Nails x 10.  She is requesting diabetic shoes.  She is not able to drive to [place] to go to [place].  Will try to keep her notified when we start doing shoes again.

## 2024-06-18 ENCOUNTER — Ambulatory Visit: Admitting: Podiatry

## 2024-07-22 ENCOUNTER — Ambulatory Visit: Admitting: Podiatry

## 2024-08-07 ENCOUNTER — Ambulatory Visit: Admitting: Podiatry

## 2024-08-19 ENCOUNTER — Ambulatory Visit: Admitting: Podiatry

## 2024-09-09 ENCOUNTER — Ambulatory Visit (INDEPENDENT_AMBULATORY_CARE_PROVIDER_SITE_OTHER): Admitting: Podiatry

## 2024-09-09 DIAGNOSIS — M79674 Pain in right toe(s): Secondary | ICD-10-CM

## 2024-09-09 DIAGNOSIS — B351 Tinea unguium: Secondary | ICD-10-CM

## 2024-09-09 DIAGNOSIS — M79675 Pain in left toe(s): Secondary | ICD-10-CM

## 2024-09-09 DIAGNOSIS — Z0189 Encounter for other specified special examinations: Secondary | ICD-10-CM

## 2024-09-09 DIAGNOSIS — E1140 Type 2 diabetes mellitus with diabetic neuropathy, unspecified: Secondary | ICD-10-CM | POA: Diagnosis not present

## 2024-09-09 DIAGNOSIS — E119 Type 2 diabetes mellitus without complications: Secondary | ICD-10-CM | POA: Diagnosis not present

## 2024-09-09 NOTE — Progress Notes (Signed)
 Subjective:  Patient ID: Rebekah Stewart, female    DOB: 1935-05-02,  MRN: [id number]  Rebekah Stewart presents to clinic today for:  Chief Complaint  Patient presents with   [facility]    [facility] A1c 6.7 07/21/24.    Patient notes nails are thick, discolored, elongated and painful in shoegear when trying to ambulate.  Patient is requesting paperwork for diabetic shoes today.  She states that she sees a publishing rights manager who manages her diabetes.  She was made aware that she does need to have an MD or DO be her certifying physician for the diabetic shoes.  Past Medical History:  Diagnosis Date   Arthritis of back    lumbar   Atrial fibrillation (HCC) 2010   Atrial fibrillation (HCC)    Atrial fibrillation (HCC)    Chronic diastolic heart failure (HCC)    Colonic polyp    Compression fracture of thoracic vertebra (HCC)    Congestive heart failure, unspecified    Coronary artery disease    mild non obstructive disease per cardiac cath in 2010   Degeneration of lumbar or lumbosacral intervertebral disc    Diverticulosis    DM2 (diabetes mellitus, type 2) (HCC)    Hemorrhoids    HLD (hyperlipidemia)    HTN (hypertension)    diagnosed when she was in her 30s. Refractory. No RAS by [place]  in 2010   Long-term (current) use of anticoagulants    Lumbosacral spondylosis without myelopathy    Osteoarthrosis, unspecified whether generalized or localized, unspecified site    Pain in thoracic spine    Swelling of limb    UTI (lower urinary tract infection)    Varicose veins of lower extremities with other complications    Vitamin D deficiency    Past Surgical History:  Procedure Laterality Date   ADENOIDECTOMY     CARDIAC CATHETERIZATION  2010   At [facility] Mild nonobstructive CAD   CORONARY ANGIOPLASTY     EYE SURGERY     TONSILLECTOMY     Allergies  Allergen Reactions   Bee Venom Shortness Of Breath and Swelling   Sulfamethoxazole-Trimethoprim Other (See Comments)    Sore  throat and rash in mouth   Iodinated Contrast Media Itching   Penicillins Swelling   Sacubitril-Valsartan     Hypotension at low dose   Tape     Pulls the skin    Review of Systems: Negative except as noted in the HPI.  Objective:  Rebekah Stewart is a pleasant 89 y.o. female in NAD. AAO x 3.  Vascular Examination: Capillary refill time is 3-5 seconds to toes bilateral. Palpable pedal pulses b/l LE. Digital hair present b/l.  Skin temperature gradient WNL b/l. No varicosities b/l. No cyanosis noted b/l.   Dermatological Examination: Pedal skin with normal turgor, texture and tone b/l. No open wounds. No interdigital macerations b/l. Toenails x10 are 3mm thick, discolored, dystrophic with subungual debris. There is pain with compression of the nail plates.  They are elongated x10  Neurological examination: Decreased protective sensation to the toes and vibratory sensation.  Assessment/Plan: 1. Type 2 diabetes, controlled, with neuropathy (HCC)   2. Pain due to onychomycosis of toenails of both feet    The mycotic toenails were sharply debrided x10 with sterile nail nippers and a power debriding burr to decrease bulk/thickness and length.    Patient qualifies for shoes due to diabetes with peripheral neuropathy.  Paperwork was completed for [employer].  A copy was made for her chart.  She was handed all of the paperwork and instructed to deliver this to her primary care physician's office.  They will need to complete their portion, attach her most recent diabetic evaluation note, and fax all of the completed paperwork as 1 unit to [employer].  [employer] will then review the information and contact the patient to set up an appointment for shoe consultation  Return in about 3 months (around 12/08/2024) for [facility].   Awanda CHARM Imperial, DPM, FACFAS [facility]     [address] [place], [place] [address]                Office [phone]  Fax [phone]

## 2024-12-09 ENCOUNTER — Ambulatory Visit: Admitting: Podiatry
